# Patient Record
Sex: Male | Born: 1975 | Race: Black or African American | Hispanic: No | Marital: Single | Smoking: Current every day smoker
Health system: Southern US, Community
[De-identification: ages and names within clinical notes are randomized; demographics above are authoritative.]

## PROBLEM LIST (undated history)

## (undated) DIAGNOSIS — N184 Chronic kidney disease, stage 4 (severe): Secondary | ICD-10-CM

## (undated) DIAGNOSIS — J449 Chronic obstructive pulmonary disease, unspecified: Secondary | ICD-10-CM

## (undated) DIAGNOSIS — F32A Depression, unspecified: Secondary | ICD-10-CM

## (undated) DIAGNOSIS — Z87442 Personal history of urinary calculi: Secondary | ICD-10-CM

## (undated) DIAGNOSIS — K589 Irritable bowel syndrome without diarrhea: Secondary | ICD-10-CM

## (undated) DIAGNOSIS — M519 Unspecified thoracic, thoracolumbar and lumbosacral intervertebral disc disorder: Secondary | ICD-10-CM

## (undated) DIAGNOSIS — E291 Testicular hypofunction: Secondary | ICD-10-CM

## (undated) DIAGNOSIS — R519 Headache, unspecified: Secondary | ICD-10-CM

## (undated) DIAGNOSIS — G629 Polyneuropathy, unspecified: Secondary | ICD-10-CM

## (undated) DIAGNOSIS — E785 Hyperlipidemia, unspecified: Secondary | ICD-10-CM

## (undated) DIAGNOSIS — F419 Anxiety disorder, unspecified: Secondary | ICD-10-CM

## (undated) DIAGNOSIS — I10 Essential (primary) hypertension: Secondary | ICD-10-CM

## (undated) DIAGNOSIS — D649 Anemia, unspecified: Secondary | ICD-10-CM

## (undated) DIAGNOSIS — M1990 Unspecified osteoarthritis, unspecified site: Secondary | ICD-10-CM

## (undated) DIAGNOSIS — G43909 Migraine, unspecified, not intractable, without status migrainosus: Secondary | ICD-10-CM

## (undated) DIAGNOSIS — Z720 Tobacco use: Secondary | ICD-10-CM

## (undated) DIAGNOSIS — F319 Bipolar disorder, unspecified: Secondary | ICD-10-CM

## (undated) DIAGNOSIS — F329 Major depressive disorder, single episode, unspecified: Secondary | ICD-10-CM

## (undated) DIAGNOSIS — M545 Low back pain, unspecified: Secondary | ICD-10-CM

## (undated) DIAGNOSIS — G6181 Chronic inflammatory demyelinating polyneuritis: Secondary | ICD-10-CM

## (undated) DIAGNOSIS — R51 Headache: Secondary | ICD-10-CM

## (undated) DIAGNOSIS — L600 Ingrowing nail: Principal | ICD-10-CM

## (undated) DIAGNOSIS — R011 Cardiac murmur, unspecified: Secondary | ICD-10-CM

## (undated) DIAGNOSIS — J42 Unspecified chronic bronchitis: Secondary | ICD-10-CM

## (undated) DIAGNOSIS — E119 Type 2 diabetes mellitus without complications: Secondary | ICD-10-CM

## (undated) DIAGNOSIS — G4733 Obstructive sleep apnea (adult) (pediatric): Secondary | ICD-10-CM

## (undated) DIAGNOSIS — F1910 Other psychoactive substance abuse, uncomplicated: Secondary | ICD-10-CM

## (undated) DIAGNOSIS — E0590 Thyrotoxicosis, unspecified without thyrotoxic crisis or storm: Secondary | ICD-10-CM

## (undated) DIAGNOSIS — G8929 Other chronic pain: Secondary | ICD-10-CM

## (undated) DIAGNOSIS — K219 Gastro-esophageal reflux disease without esophagitis: Secondary | ICD-10-CM

## (undated) HISTORY — PX: CARPAL TUNNEL RELEASE: SHX101

## (undated) HISTORY — DX: Tobacco use: Z72.0

## (undated) HISTORY — DX: Depression, unspecified: F32.A

## (undated) HISTORY — DX: Obstructive sleep apnea (adult) (pediatric): G47.33

## (undated) HISTORY — DX: Gastro-esophageal reflux disease without esophagitis: K21.9

## (undated) HISTORY — DX: Essential (primary) hypertension: I10

## (undated) HISTORY — DX: Hyperlipidemia, unspecified: E78.5

## (undated) HISTORY — PX: LITHOTRIPSY: SUR834

## (undated) HISTORY — DX: Other psychoactive substance abuse, uncomplicated: F19.10

## (undated) HISTORY — DX: Polyneuropathy, unspecified: G62.9

## (undated) HISTORY — DX: Major depressive disorder, single episode, unspecified: F32.9

## (undated) HISTORY — DX: Ingrowing nail: L60.0

## (undated) HISTORY — DX: Unspecified osteoarthritis, unspecified site: M19.90

## (undated) HISTORY — DX: Unspecified thoracic, thoracolumbar and lumbosacral intervertebral disc disorder: M51.9

---
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
## 2007-02-14 ENCOUNTER — Ambulatory Visit: Payer: Self-pay | Admitting: Internal Medicine

## 2007-02-14 LAB — CONVERTED CEMR LAB
Albumin: 4.4 g/dL (ref 3.5–5.2)
Alkaline Phosphatase: 130 units/L — ABNORMAL HIGH (ref 39–117)
BUN: 12 mg/dL (ref 6–23)
Basophils Absolute: 0.1 10*3/uL (ref 0.0–0.1)
Bilirubin Urine: NEGATIVE
Cholesterol: 223 mg/dL (ref 0–200)
Creatinine,U: 129 mg/dL
Direct LDL: 153 mg/dL
Eosinophils Absolute: 0.1 10*3/uL (ref 0.0–0.6)
GFR calc Af Amer: 113 mL/min
GFR calc non Af Amer: 93 mL/min
HCT: 39.6 % (ref 39.0–52.0)
HDL: 25.9 mg/dL — ABNORMAL LOW (ref >39.0)
Hemoglobin, Urine: NEGATIVE
Hemoglobin: 14 g/dL (ref 13.0–17.0)
Leukocytes, UA: NEGATIVE
MCHC: 35.4 g/dL (ref 30.0–36.0)
MCV: 91.5 fL (ref 78.0–100.0)
Microalb Creat Ratio: 31.8 mg/g — ABNORMAL HIGH (ref 0.0–30.0)
Microalb, Ur: 4.1 mg/dL — ABNORMAL HIGH (ref 0.0–1.9)
Monocytes Absolute: 0.8 10*3/uL — ABNORMAL HIGH (ref 0.2–0.7)
Monocytes Relative: 9.8 % (ref 3.0–11.0)
Neutro Abs: 5.1 10*3/uL (ref 1.4–7.7)
Neutrophils Relative %: 59.7 % (ref 43.0–77.0)
Potassium: 3.9 meq/L (ref 3.5–5.1)
RDW: 13.4 % (ref 11.5–14.6)
Sodium: 132 meq/L — ABNORMAL LOW (ref 135–145)
TSH: 1 microintl units/mL (ref 0.35–5.50)
Total Bilirubin: 0.6 mg/dL (ref 0.3–1.2)
Total CHOL/HDL Ratio: 8.6
Urobilinogen, UA: 0.2 (ref 0.0–1.0)
VLDL: 43 mg/dL — ABNORMAL HIGH (ref 0–40)
pH: 6 (ref 5.0–8.0)

## 2007-03-20 ENCOUNTER — Encounter: Admission: RE | Admit: 2007-03-20 | Discharge: 2007-03-29 | Payer: Self-pay | Admitting: Internal Medicine

## 2007-04-10 ENCOUNTER — Ambulatory Visit: Payer: Self-pay | Admitting: Internal Medicine

## 2007-04-10 LAB — CONVERTED CEMR LAB
BUN: 11 mg/dL (ref 6–23)
Calcium: 9.4 mg/dL (ref 8.4–10.5)
Creatinine, Ser: 1 mg/dL (ref 0.4–1.5)
GFR calc Af Amer: 112 mL/min
Hgb A1c MFr Bld: 7.9 % — ABNORMAL HIGH (ref 4.6–6.0)
Potassium: 4.3 meq/L (ref 3.5–5.1)
Triglycerides: 78 mg/dL (ref 0–149)
VLDL: 16 mg/dL (ref 0–40)

## 2007-04-12 ENCOUNTER — Encounter: Payer: Self-pay | Admitting: Internal Medicine

## 2007-04-12 DIAGNOSIS — Z87442 Personal history of urinary calculi: Secondary | ICD-10-CM

## 2007-04-12 DIAGNOSIS — R51 Headache: Secondary | ICD-10-CM

## 2007-04-12 DIAGNOSIS — E1165 Type 2 diabetes mellitus with hyperglycemia: Secondary | ICD-10-CM | POA: Insufficient documentation

## 2007-04-12 DIAGNOSIS — R519 Headache, unspecified: Secondary | ICD-10-CM | POA: Insufficient documentation

## 2007-04-12 DIAGNOSIS — M1990 Unspecified osteoarthritis, unspecified site: Secondary | ICD-10-CM | POA: Insufficient documentation

## 2007-04-12 DIAGNOSIS — E1169 Type 2 diabetes mellitus with other specified complication: Secondary | ICD-10-CM | POA: Insufficient documentation

## 2007-04-12 DIAGNOSIS — E785 Hyperlipidemia, unspecified: Secondary | ICD-10-CM

## 2007-04-12 DIAGNOSIS — I10 Essential (primary) hypertension: Secondary | ICD-10-CM | POA: Insufficient documentation

## 2007-04-12 DIAGNOSIS — F339 Major depressive disorder, recurrent, unspecified: Secondary | ICD-10-CM | POA: Insufficient documentation

## 2007-04-19 ENCOUNTER — Emergency Department (HOSPITAL_COMMUNITY): Admission: EM | Admit: 2007-04-19 | Discharge: 2007-04-19 | Payer: Self-pay | Admitting: Emergency Medicine

## 2007-05-23 ENCOUNTER — Encounter: Payer: Self-pay | Admitting: Internal Medicine

## 2007-05-23 ENCOUNTER — Ambulatory Visit: Payer: Self-pay | Admitting: Internal Medicine

## 2007-05-23 LAB — CONVERTED CEMR LAB
CO2: 32 meq/L (ref 19–32)
Calcium: 9.8 mg/dL (ref 8.4–10.5)
Chloride: 108 meq/L (ref 96–112)
Cholesterol: 171 mg/dL (ref 0–200)
Glucose, Bld: 106 mg/dL — ABNORMAL HIGH (ref 70–99)
HDL: 37 mg/dL — ABNORMAL LOW (ref >39.0)
Sodium: 144 meq/L (ref 135–145)
Total CHOL/HDL Ratio: 4.6

## 2007-06-07 ENCOUNTER — Telehealth: Payer: Self-pay | Admitting: Internal Medicine

## 2007-06-09 ENCOUNTER — Ambulatory Visit: Payer: Self-pay | Admitting: Internal Medicine

## 2007-07-24 ENCOUNTER — Telehealth: Payer: Self-pay | Admitting: Internal Medicine

## 2007-08-09 ENCOUNTER — Telehealth (INDEPENDENT_AMBULATORY_CARE_PROVIDER_SITE_OTHER): Payer: Self-pay | Admitting: *Deleted

## 2007-09-14 ENCOUNTER — Ambulatory Visit: Payer: Self-pay | Admitting: Internal Medicine

## 2007-09-14 DIAGNOSIS — F528 Other sexual dysfunction not due to a substance or known physiological condition: Secondary | ICD-10-CM

## 2007-09-18 ENCOUNTER — Telehealth: Payer: Self-pay | Admitting: Internal Medicine

## 2007-09-18 LAB — CONVERTED CEMR LAB
BUN: 14 mg/dL (ref 6–23)
GFR calc Af Amer: 91 mL/min
GFR calc non Af Amer: 75 mL/min
Hgb A1c MFr Bld: 5.7 % (ref 4.6–6.0)
Potassium: 3.9 meq/L (ref 3.5–5.1)
Testosterone: 186.67 ng/dL — ABNORMAL LOW (ref 350.00–890)
Total CHOL/HDL Ratio: 5.4
Triglycerides: 78 mg/dL (ref 0–149)
VLDL: 16 mg/dL (ref 0–40)

## 2007-09-19 ENCOUNTER — Telehealth: Payer: Self-pay | Admitting: Internal Medicine

## 2007-09-20 ENCOUNTER — Encounter: Payer: Self-pay | Admitting: Internal Medicine

## 2007-10-02 ENCOUNTER — Encounter: Payer: Self-pay | Admitting: Internal Medicine

## 2007-10-02 ENCOUNTER — Telehealth (INDEPENDENT_AMBULATORY_CARE_PROVIDER_SITE_OTHER): Payer: Self-pay | Admitting: *Deleted

## 2007-10-09 ENCOUNTER — Ambulatory Visit: Payer: Self-pay | Admitting: Internal Medicine

## 2007-10-09 DIAGNOSIS — E291 Testicular hypofunction: Secondary | ICD-10-CM

## 2007-10-09 LAB — CONVERTED CEMR LAB
ALT: 77 units/L — ABNORMAL HIGH (ref 0–53)
Alkaline Phosphatase: 75 units/L (ref 39–117)
BUN: 14 mg/dL (ref 6–23)
CO2: 31 meq/L (ref 19–32)
Calcium: 9.8 mg/dL (ref 8.4–10.5)
Chloride: 109 meq/L (ref 96–112)
Creatinine, Ser: 1.3 mg/dL (ref 0.4–1.5)
GFR calc Af Amer: 83 mL/min
Total Protein: 7.9 g/dL (ref 6.0–8.3)

## 2007-10-10 ENCOUNTER — Telehealth: Payer: Self-pay | Admitting: Internal Medicine

## 2007-10-10 ENCOUNTER — Ambulatory Visit: Payer: Self-pay | Admitting: Internal Medicine

## 2007-10-10 LAB — CONVERTED CEMR LAB: Sex Hormone Binding: 12 nmol/L — ABNORMAL LOW (ref 13–71)

## 2007-10-11 ENCOUNTER — Telehealth: Payer: Self-pay | Admitting: Internal Medicine

## 2007-10-11 LAB — CONVERTED CEMR LAB
Hep B C IgM: NEGATIVE
Hepatitis B Surface Ag: NEGATIVE

## 2007-10-12 ENCOUNTER — Telehealth: Payer: Self-pay | Admitting: Internal Medicine

## 2007-12-01 ENCOUNTER — Ambulatory Visit: Payer: Self-pay | Admitting: Internal Medicine

## 2007-12-02 LAB — CONVERTED CEMR LAB
ALT: 36 units/L (ref 0–53)
BUN: 14 mg/dL (ref 6–23)
Basophils Relative: 0 % (ref 0.0–1.0)
CO2: 27 meq/L (ref 19–32)
Calcium: 9.3 mg/dL (ref 8.4–10.5)
Cholesterol: 178 mg/dL (ref 0–200)
Creatinine, Ser: 1.1 mg/dL (ref 0.4–1.5)
Creatinine,U: 115.6 mg/dL
Eosinophils Relative: 2.1 % (ref 0.0–5.0)
Glucose, Bld: 129 mg/dL — ABNORMAL HIGH (ref 70–99)
Hemoglobin: 13.5 g/dL (ref 13.0–17.0)
LDL Cholesterol: 136 mg/dL — ABNORMAL HIGH (ref 0–99)
Lymphocytes Relative: 33.7 % (ref 12.0–46.0)
MCHC: 33.9 g/dL (ref 30.0–36.0)
Microalb, Ur: 0.9 mg/dL (ref 0.0–1.9)
Monocytes Relative: 7.3 % (ref 3.0–12.0)
Neutro Abs: 3.6 10*3/uL (ref 1.4–7.7)
RBC: 4.23 M/uL (ref 4.22–5.81)
Total CHOL/HDL Ratio: 6.1
Total Protein: 7.3 g/dL (ref 6.0–8.3)

## 2007-12-04 ENCOUNTER — Ambulatory Visit: Payer: Self-pay | Admitting: Internal Medicine

## 2007-12-04 DIAGNOSIS — J984 Other disorders of lung: Secondary | ICD-10-CM

## 2007-12-04 DIAGNOSIS — M25539 Pain in unspecified wrist: Secondary | ICD-10-CM | POA: Insufficient documentation

## 2007-12-04 DIAGNOSIS — M25519 Pain in unspecified shoulder: Secondary | ICD-10-CM

## 2007-12-04 LAB — CONVERTED CEMR LAB
HCV Ab: NEGATIVE
Hep A IgM: NEGATIVE

## 2007-12-06 ENCOUNTER — Telehealth (INDEPENDENT_AMBULATORY_CARE_PROVIDER_SITE_OTHER): Payer: Self-pay | Admitting: *Deleted

## 2007-12-13 ENCOUNTER — Encounter: Payer: Self-pay | Admitting: Internal Medicine

## 2008-04-24 ENCOUNTER — Ambulatory Visit: Payer: Self-pay | Admitting: Internal Medicine

## 2008-06-04 ENCOUNTER — Ambulatory Visit: Payer: Self-pay | Admitting: Internal Medicine

## 2008-06-04 LAB — CONVERTED CEMR LAB
CO2: 27 meq/L (ref 19–32)
Calcium: 9.6 mg/dL (ref 8.4–10.5)
Chloride: 102 meq/L (ref 96–112)
Creatinine, Ser: 1 mg/dL (ref 0.4–1.5)
Glucose, Bld: 308 mg/dL — ABNORMAL HIGH (ref 70–99)
HDL: 28.9 mg/dL — ABNORMAL LOW (ref >39.0)
Sodium: 136 meq/L (ref 135–145)
Triglycerides: 158 mg/dL — ABNORMAL HIGH (ref 0–149)

## 2008-06-05 ENCOUNTER — Ambulatory Visit: Payer: Self-pay | Admitting: Internal Medicine

## 2008-06-05 DIAGNOSIS — R0989 Other specified symptoms and signs involving the circulatory and respiratory systems: Secondary | ICD-10-CM | POA: Insufficient documentation

## 2008-06-05 DIAGNOSIS — R0609 Other forms of dyspnea: Secondary | ICD-10-CM

## 2008-06-10 ENCOUNTER — Telehealth (INDEPENDENT_AMBULATORY_CARE_PROVIDER_SITE_OTHER): Payer: Self-pay | Admitting: *Deleted

## 2008-06-11 ENCOUNTER — Telehealth: Payer: Self-pay | Admitting: Internal Medicine

## 2008-10-08 ENCOUNTER — Encounter: Payer: Self-pay | Admitting: Internal Medicine

## 2008-12-11 ENCOUNTER — Telehealth: Payer: Self-pay | Admitting: Internal Medicine

## 2008-12-16 ENCOUNTER — Emergency Department (HOSPITAL_COMMUNITY): Admission: EM | Admit: 2008-12-16 | Discharge: 2008-12-16 | Payer: Self-pay | Admitting: Emergency Medicine

## 2008-12-16 ENCOUNTER — Inpatient Hospital Stay (HOSPITAL_COMMUNITY): Admission: RE | Admit: 2008-12-16 | Discharge: 2008-12-20 | Payer: Self-pay | Admitting: *Deleted

## 2008-12-16 ENCOUNTER — Ambulatory Visit: Payer: Self-pay | Admitting: *Deleted

## 2008-12-29 ENCOUNTER — Ambulatory Visit: Payer: Self-pay | Admitting: Diagnostic Radiology

## 2008-12-29 ENCOUNTER — Encounter: Payer: Self-pay | Admitting: Emergency Medicine

## 2008-12-29 ENCOUNTER — Ambulatory Visit: Payer: Self-pay | Admitting: *Deleted

## 2008-12-30 ENCOUNTER — Observation Stay (HOSPITAL_COMMUNITY): Admission: EM | Admit: 2008-12-30 | Discharge: 2008-12-30 | Payer: Self-pay | Admitting: Cardiology

## 2008-12-30 ENCOUNTER — Encounter (INDEPENDENT_AMBULATORY_CARE_PROVIDER_SITE_OTHER): Payer: Self-pay | Admitting: Hematology and Oncology

## 2009-01-10 ENCOUNTER — Ambulatory Visit: Payer: Self-pay | Admitting: Internal Medicine

## 2009-01-10 ENCOUNTER — Encounter: Payer: Self-pay | Admitting: Internal Medicine

## 2009-01-10 DIAGNOSIS — S62609A Fracture of unspecified phalanx of unspecified finger, initial encounter for closed fracture: Secondary | ICD-10-CM | POA: Insufficient documentation

## 2009-01-10 DIAGNOSIS — F1410 Cocaine abuse, uncomplicated: Secondary | ICD-10-CM

## 2009-01-10 DIAGNOSIS — R079 Chest pain, unspecified: Secondary | ICD-10-CM | POA: Insufficient documentation

## 2009-01-10 DIAGNOSIS — M79609 Pain in unspecified limb: Secondary | ICD-10-CM

## 2009-01-10 LAB — CONVERTED CEMR LAB
Albumin: 3.7 g/dL (ref 3.5–5.2)
Basophils Absolute: 0.1 10*3/uL (ref 0.0–0.1)
Basophils Relative: 2.9 % (ref 0.0–3.0)
Bilirubin Urine: NEGATIVE
CO2: 29 meq/L (ref 19–32)
Calcium: 9.2 mg/dL (ref 8.4–10.5)
Chloride: 107 meq/L (ref 96–112)
Cholesterol: 204 mg/dL — ABNORMAL HIGH (ref 0–200)
Creatinine,U: 106.5 mg/dL
Direct LDL: 150.5 mg/dL
Eosinophils Absolute: 0.1 10*3/uL (ref 0.0–0.7)
Glucose, Bld: 201 mg/dL — ABNORMAL HIGH (ref 70–99)
HCT: 38.3 % — ABNORMAL LOW (ref 39.0–52.0)
HDL: 39.7 mg/dL (ref >39.00)
Hemoglobin, Urine: NEGATIVE
Hemoglobin: 13.7 g/dL (ref 13.0–17.0)
Lymphocytes Relative: 27.3 % (ref 12.0–46.0)
Lymphs Abs: 1.3 10*3/uL (ref 0.7–4.0)
MCHC: 35.8 g/dL (ref 30.0–36.0)
MCV: 92.5 fL (ref 78.0–100.0)
Microalb Creat Ratio: 1.9 mg/g (ref 0.0–30.0)
Monocytes Absolute: 0.4 10*3/uL (ref 0.1–1.0)
Neutro Abs: 2.9 10*3/uL (ref 1.4–7.7)
Nitrite: NEGATIVE
RDW: 13 % (ref 11.5–14.6)
Sodium: 143 meq/L (ref 135–145)
Total Protein, Urine: NEGATIVE mg/dL
Total Protein: 6.8 g/dL (ref 6.0–8.3)
Triglycerides: 99 mg/dL (ref 0.0–149.0)
Urine Glucose: >=1000 mg/dL
VLDL: 19.8 mg/dL (ref 0.0–40.0)
pH: 5.5 (ref 5.0–8.0)

## 2009-01-17 ENCOUNTER — Encounter: Payer: Self-pay | Admitting: Internal Medicine

## 2009-01-23 ENCOUNTER — Encounter: Payer: Self-pay | Admitting: Internal Medicine

## 2009-01-24 ENCOUNTER — Ambulatory Visit: Payer: Self-pay | Admitting: Licensed Clinical Social Worker

## 2009-01-24 ENCOUNTER — Ambulatory Visit: Payer: Self-pay | Admitting: Cardiology

## 2009-01-24 ENCOUNTER — Telehealth (INDEPENDENT_AMBULATORY_CARE_PROVIDER_SITE_OTHER): Payer: Self-pay | Admitting: *Deleted

## 2009-02-03 ENCOUNTER — Ambulatory Visit: Payer: Self-pay | Admitting: Internal Medicine

## 2009-02-03 DIAGNOSIS — R300 Dysuria: Secondary | ICD-10-CM | POA: Insufficient documentation

## 2009-02-04 LAB — CONVERTED CEMR LAB
Chlamydia, Swab/Urine, PCR: NEGATIVE
GC Probe Amp, Urine: NEGATIVE
Ketones, ur: NEGATIVE mg/dL
Specific Gravity, Urine: >=1.03 (ref 1.000–1.030)
Urine Glucose: 250 mg/dL
Urobilinogen, UA: 0.2 (ref 0.0–1.0)

## 2009-02-05 ENCOUNTER — Telehealth: Payer: Self-pay | Admitting: Internal Medicine

## 2009-02-06 ENCOUNTER — Ambulatory Visit: Payer: Self-pay | Admitting: Licensed Clinical Social Worker

## 2009-02-07 ENCOUNTER — Encounter: Payer: Self-pay | Admitting: Internal Medicine

## 2009-02-11 ENCOUNTER — Encounter: Payer: Self-pay | Admitting: Internal Medicine

## 2009-02-11 ENCOUNTER — Encounter (INDEPENDENT_AMBULATORY_CARE_PROVIDER_SITE_OTHER): Payer: Self-pay | Admitting: *Deleted

## 2009-02-17 ENCOUNTER — Ambulatory Visit: Payer: Self-pay

## 2009-02-17 ENCOUNTER — Ambulatory Visit: Payer: Self-pay | Admitting: Cardiology

## 2009-02-21 ENCOUNTER — Encounter: Payer: Self-pay | Admitting: Internal Medicine

## 2009-03-18 ENCOUNTER — Ambulatory Visit: Payer: Self-pay | Admitting: Internal Medicine

## 2009-03-19 LAB — CONVERTED CEMR LAB
Calcium: 9.9 mg/dL (ref 8.4–10.5)
GFR calc non Af Amer: 110.63 mL/min (ref >60)
HDL: 39.4 mg/dL (ref >39.00)
Potassium: 4.4 meq/L (ref 3.5–5.1)
Sodium: 140 meq/L (ref 135–145)
Triglycerides: 66 mg/dL (ref 0.0–149.0)
VLDL: 13.2 mg/dL (ref 0.0–40.0)

## 2009-04-01 ENCOUNTER — Telehealth: Payer: Self-pay | Admitting: Internal Medicine

## 2009-04-02 ENCOUNTER — Telehealth: Payer: Self-pay | Admitting: Internal Medicine

## 2009-04-07 ENCOUNTER — Ambulatory Visit: Payer: Self-pay | Admitting: Internal Medicine

## 2009-04-07 DIAGNOSIS — J019 Acute sinusitis, unspecified: Secondary | ICD-10-CM

## 2009-04-08 ENCOUNTER — Telehealth: Payer: Self-pay | Admitting: Internal Medicine

## 2009-05-05 ENCOUNTER — Ambulatory Visit: Payer: Self-pay | Admitting: Cardiology

## 2009-05-19 ENCOUNTER — Ambulatory Visit: Payer: Self-pay | Admitting: Internal Medicine

## 2009-05-20 ENCOUNTER — Encounter: Payer: Self-pay | Admitting: Internal Medicine

## 2009-06-02 ENCOUNTER — Ambulatory Visit: Payer: Self-pay | Admitting: Internal Medicine

## 2009-06-02 DIAGNOSIS — M5430 Sciatica, unspecified side: Secondary | ICD-10-CM | POA: Insufficient documentation

## 2009-06-02 DIAGNOSIS — M21619 Bunion of unspecified foot: Secondary | ICD-10-CM

## 2009-06-02 LAB — CONVERTED CEMR LAB
BUN: 8 mg/dL (ref 6–23)
Bilirubin, Direct: 0.1 mg/dL (ref 0.0–0.3)
CO2: 28 meq/L (ref 19–32)
Calcium: 9.2 mg/dL (ref 8.4–10.5)
Chloride: 108 meq/L (ref 96–112)
Cholesterol: 125 mg/dL (ref 0–200)
Creatinine, Ser: 1.1 mg/dL (ref 0.4–1.5)
Glucose, Bld: 118 mg/dL — ABNORMAL HIGH (ref 70–99)
LDL Cholesterol: 84 mg/dL (ref 0–99)
Total Bilirubin: 0.6 mg/dL (ref 0.3–1.2)
Total CHOL/HDL Ratio: 4
Total Protein: 7.7 g/dL (ref 6.0–8.3)
VLDL: 9 mg/dL (ref 0.0–40.0)

## 2009-06-09 ENCOUNTER — Telehealth: Payer: Self-pay | Admitting: Internal Medicine

## 2009-06-09 DIAGNOSIS — K921 Melena: Secondary | ICD-10-CM | POA: Insufficient documentation

## 2009-06-10 ENCOUNTER — Ambulatory Visit: Payer: Self-pay | Admitting: Gastroenterology

## 2009-06-10 DIAGNOSIS — K625 Hemorrhage of anus and rectum: Secondary | ICD-10-CM | POA: Insufficient documentation

## 2009-06-10 DIAGNOSIS — R12 Heartburn: Secondary | ICD-10-CM | POA: Insufficient documentation

## 2009-06-10 DIAGNOSIS — K589 Irritable bowel syndrome without diarrhea: Secondary | ICD-10-CM | POA: Insufficient documentation

## 2009-06-24 ENCOUNTER — Telehealth: Payer: Self-pay | Admitting: Gastroenterology

## 2009-06-25 HISTORY — PX: BUNIONECTOMY WITH HAMMERTOE RECONSTRUCTION: SHX5600

## 2009-06-27 ENCOUNTER — Ambulatory Visit: Payer: Self-pay | Admitting: Internal Medicine

## 2009-06-27 DIAGNOSIS — G4710 Hypersomnia, unspecified: Secondary | ICD-10-CM | POA: Insufficient documentation

## 2009-06-27 DIAGNOSIS — M549 Dorsalgia, unspecified: Secondary | ICD-10-CM

## 2009-07-08 ENCOUNTER — Telehealth: Payer: Self-pay | Admitting: Gastroenterology

## 2009-07-09 ENCOUNTER — Ambulatory Visit: Payer: Self-pay | Admitting: Gastroenterology

## 2009-07-16 ENCOUNTER — Ambulatory Visit: Payer: Self-pay | Admitting: Pulmonary Disease

## 2009-08-05 ENCOUNTER — Encounter: Payer: Self-pay | Admitting: Internal Medicine

## 2009-08-07 ENCOUNTER — Ambulatory Visit: Payer: Self-pay | Admitting: Internal Medicine

## 2009-08-07 LAB — CONVERTED CEMR LAB
BUN: 11 mg/dL (ref 6–23)
CO2: 28 meq/L (ref 19–32)
Chloride: 100 meq/L (ref 96–112)
HDL: 30.6 mg/dL — ABNORMAL LOW (ref >39.00)
Hgb A1c MFr Bld: 6.8 % — ABNORMAL HIGH (ref 4.6–6.5)
LDL Cholesterol: 94 mg/dL (ref 0–99)
Potassium: 3.7 meq/L (ref 3.5–5.1)
VLDL: 35.4 mg/dL (ref 0.0–40.0)

## 2009-08-21 ENCOUNTER — Telehealth: Payer: Self-pay | Admitting: Internal Medicine

## 2009-08-22 ENCOUNTER — Ambulatory Visit: Payer: Self-pay | Admitting: Internal Medicine

## 2009-09-01 ENCOUNTER — Ambulatory Visit (HOSPITAL_BASED_OUTPATIENT_CLINIC_OR_DEPARTMENT_OTHER): Admission: RE | Admit: 2009-09-01 | Discharge: 2009-09-01 | Payer: Self-pay | Admitting: Pulmonary Disease

## 2009-09-01 ENCOUNTER — Encounter: Payer: Self-pay | Admitting: Pulmonary Disease

## 2009-09-14 ENCOUNTER — Ambulatory Visit: Payer: Self-pay | Admitting: Pulmonary Disease

## 2009-09-15 ENCOUNTER — Telehealth (INDEPENDENT_AMBULATORY_CARE_PROVIDER_SITE_OTHER): Payer: Self-pay | Admitting: *Deleted

## 2009-09-17 ENCOUNTER — Encounter: Payer: Self-pay | Admitting: Internal Medicine

## 2009-09-19 ENCOUNTER — Telehealth: Payer: Self-pay | Admitting: Internal Medicine

## 2009-09-22 ENCOUNTER — Ambulatory Visit: Payer: Self-pay | Admitting: Internal Medicine

## 2009-09-26 ENCOUNTER — Ambulatory Visit: Payer: Self-pay | Admitting: Pulmonary Disease

## 2009-09-26 DIAGNOSIS — G4733 Obstructive sleep apnea (adult) (pediatric): Secondary | ICD-10-CM

## 2009-10-02 ENCOUNTER — Telehealth (INDEPENDENT_AMBULATORY_CARE_PROVIDER_SITE_OTHER): Payer: Self-pay | Admitting: *Deleted

## 2009-10-03 ENCOUNTER — Telehealth: Payer: Self-pay | Admitting: Internal Medicine

## 2009-10-13 ENCOUNTER — Telehealth: Payer: Self-pay | Admitting: Internal Medicine

## 2009-10-27 ENCOUNTER — Encounter: Payer: Self-pay | Admitting: Pulmonary Disease

## 2009-11-03 ENCOUNTER — Ambulatory Visit: Payer: Self-pay | Admitting: Pulmonary Disease

## 2009-11-13 ENCOUNTER — Ambulatory Visit: Payer: Self-pay | Admitting: Internal Medicine

## 2009-11-13 DIAGNOSIS — R350 Frequency of micturition: Secondary | ICD-10-CM

## 2009-11-13 LAB — CONVERTED CEMR LAB
Bilirubin Urine: NEGATIVE
CO2: 26 meq/L (ref 19–32)
Calcium: 10 mg/dL (ref 8.4–10.5)
Creatinine, Ser: 1.3 mg/dL (ref 0.4–1.5)
Direct LDL: 93 mg/dL
Glucose, Bld: 371 mg/dL — ABNORMAL HIGH (ref 70–99)
Hemoglobin, Urine: NEGATIVE
Nitrite: NEGATIVE
Total CHOL/HDL Ratio: 5
Urine Glucose: >=1000 mg/dL
Urobilinogen, UA: 0.2 (ref 0.0–1.0)

## 2009-11-18 ENCOUNTER — Telehealth: Payer: Self-pay | Admitting: Internal Medicine

## 2009-12-08 ENCOUNTER — Ambulatory Visit: Payer: Self-pay | Admitting: Internal Medicine

## 2009-12-31 ENCOUNTER — Ambulatory Visit: Payer: Self-pay | Admitting: Internal Medicine

## 2010-02-11 ENCOUNTER — Ambulatory Visit: Payer: Self-pay | Admitting: Internal Medicine

## 2010-02-11 DIAGNOSIS — B370 Candidal stomatitis: Secondary | ICD-10-CM | POA: Insufficient documentation

## 2010-02-17 ENCOUNTER — Ambulatory Visit: Payer: Self-pay | Admitting: Internal Medicine

## 2010-02-17 ENCOUNTER — Encounter: Payer: Self-pay | Admitting: Internal Medicine

## 2010-02-17 ENCOUNTER — Telehealth (INDEPENDENT_AMBULATORY_CARE_PROVIDER_SITE_OTHER): Payer: Self-pay | Admitting: *Deleted

## 2010-03-02 ENCOUNTER — Ambulatory Visit: Payer: Self-pay | Admitting: Internal Medicine

## 2010-03-07 ENCOUNTER — Encounter: Payer: Self-pay | Admitting: Pulmonary Disease

## 2010-03-24 ENCOUNTER — Ambulatory Visit: Payer: Self-pay | Admitting: Internal Medicine

## 2010-03-24 DIAGNOSIS — J069 Acute upper respiratory infection, unspecified: Secondary | ICD-10-CM | POA: Insufficient documentation

## 2010-04-02 ENCOUNTER — Ambulatory Visit: Payer: Self-pay | Admitting: Internal Medicine

## 2010-04-06 ENCOUNTER — Encounter: Payer: Self-pay | Admitting: Internal Medicine

## 2010-05-06 ENCOUNTER — Telehealth: Payer: Self-pay | Admitting: Pulmonary Disease

## 2010-05-19 ENCOUNTER — Telehealth: Payer: Self-pay | Admitting: Internal Medicine

## 2010-05-22 ENCOUNTER — Telehealth: Payer: Self-pay | Admitting: Internal Medicine

## 2010-06-01 ENCOUNTER — Telehealth: Payer: Self-pay | Admitting: Pulmonary Disease

## 2010-06-01 ENCOUNTER — Telehealth: Payer: Self-pay | Admitting: Internal Medicine

## 2010-06-05 ENCOUNTER — Ambulatory Visit: Payer: Self-pay | Admitting: Internal Medicine

## 2010-06-05 ENCOUNTER — Ambulatory Visit: Payer: Self-pay | Admitting: Pulmonary Disease

## 2010-06-05 ENCOUNTER — Telehealth: Payer: Self-pay | Admitting: Internal Medicine

## 2010-06-05 LAB — CONVERTED CEMR LAB
Chloride: 100 meq/L (ref 96–112)
Cholesterol: 231 mg/dL — ABNORMAL HIGH (ref 0–200)
HDL: 42.8 mg/dL (ref >39.00)
Potassium: 4.5 meq/L (ref 3.5–5.1)
Total CHOL/HDL Ratio: 5
Triglycerides: 257 mg/dL — ABNORMAL HIGH (ref 0.0–149.0)
VLDL: 51.4 mg/dL — ABNORMAL HIGH (ref 0.0–40.0)

## 2010-06-06 ENCOUNTER — Emergency Department (HOSPITAL_BASED_OUTPATIENT_CLINIC_OR_DEPARTMENT_OTHER)
Admission: EM | Admit: 2010-06-06 | Discharge: 2010-06-06 | Payer: Self-pay | Source: Home / Self Care | Admitting: Emergency Medicine

## 2010-06-08 ENCOUNTER — Telehealth: Payer: Self-pay | Admitting: Pulmonary Disease

## 2010-06-09 ENCOUNTER — Ambulatory Visit: Payer: Self-pay | Admitting: Internal Medicine

## 2010-07-07 ENCOUNTER — Telehealth: Payer: Self-pay | Admitting: Internal Medicine

## 2010-07-09 ENCOUNTER — Ambulatory Visit: Payer: Self-pay | Admitting: Psychology

## 2010-07-10 ENCOUNTER — Ambulatory Visit: Payer: Self-pay | Admitting: Internal Medicine

## 2010-07-10 ENCOUNTER — Ambulatory Visit: Payer: Self-pay | Admitting: Licensed Clinical Social Worker

## 2010-07-10 DIAGNOSIS — K219 Gastro-esophageal reflux disease without esophagitis: Secondary | ICD-10-CM | POA: Insufficient documentation

## 2010-07-13 ENCOUNTER — Inpatient Hospital Stay (HOSPITAL_COMMUNITY)
Admission: AD | Admit: 2010-07-13 | Discharge: 2010-07-16 | Payer: Self-pay | Attending: Internal Medicine | Admitting: Internal Medicine

## 2010-07-13 ENCOUNTER — Emergency Department (HOSPITAL_BASED_OUTPATIENT_CLINIC_OR_DEPARTMENT_OTHER)
Admission: EM | Admit: 2010-07-13 | Discharge: 2010-07-13 | Payer: Self-pay | Source: Home / Self Care | Admitting: Emergency Medicine

## 2010-07-15 ENCOUNTER — Telehealth: Payer: Self-pay | Admitting: Internal Medicine

## 2010-07-16 ENCOUNTER — Encounter: Payer: Self-pay | Admitting: Internal Medicine

## 2010-07-22 ENCOUNTER — Ambulatory Visit
Admission: RE | Admit: 2010-07-22 | Discharge: 2010-07-22 | Payer: Self-pay | Source: Home / Self Care | Attending: Internal Medicine | Admitting: Internal Medicine

## 2010-08-05 ENCOUNTER — Telehealth: Payer: Self-pay | Admitting: Internal Medicine

## 2010-08-19 ENCOUNTER — Ambulatory Visit
Admission: RE | Admit: 2010-08-19 | Discharge: 2010-08-19 | Payer: Self-pay | Source: Home / Self Care | Attending: Internal Medicine | Admitting: Internal Medicine

## 2010-08-19 DIAGNOSIS — T1490XA Injury, unspecified, initial encounter: Secondary | ICD-10-CM | POA: Insufficient documentation

## 2010-08-27 NOTE — Progress Notes (Signed)
Summary: CALL  Phone Note Call from Patient Call back at Home Phone [phone]   Summary of Call: Patient is requesting a call back regarding a form & referral.  Initial call taken by: Lamar Sprinkles, CMA,  July 07, 2010 11:36 AM  Follow-up for Phone Call        Pt requests a new copy of Humalog pt asst form to be filled out and picked up. Per pt he mailed of original without signing. New form printed and filled out na in cabinet for pt pick up Follow-up by: Margaret Pyle, CMA,  July 07, 2010 4:08 PM

## 2010-08-27 NOTE — Assessment & Plan Note (Signed)
Summary: rov for osa   Visit Type:  Follow-up Copy to:  Oliver Barre Primary Provider/Referring Provider:  [name]  CC:  OSA...patient says he has been without cpap since March 2011 due to having NO insurance...he c/o waking up 4-5 times everynight and daytime fatigue.  History of Present Illness: the pt comes in today for f/u of his known moderate osa.  He had been started on cpap, and was slowly getting adapted to the device.  He felt it was helping him.  Unfortunately, he lost his insurance and could not afford the machine on his own.  He has been off cpap for some time, and is having significant sleep disruption at night and daytime hypersomnia.  Current Medications (verified): 1)  Onetouch Lancets   Misc (Lancets) .... Check Bld Sugar Qid 2)  Freestyle Lite Test  Strp (Glucose Blood) .... Use Asd 1 Once Daily 3)  Glimepiride 1 Mg Tabs (Glimepiride) .[name].. 1p O Two Times A Day As Needed Blood Sugar > 200 4)  Lantus Solostar 100 Unit/ml Soln (Insulin Glargine) .... Use Asd 40 Units Subcutaneously Once Daily 5)  Humalog Kwikpen 100 Unit/ml Soln (Insulin Lispro (Human)) .... Use Asd Ssi  Allergies (verified): No Known Drug Allergies  Review of Systems       The patient complains of shortness of breath with activity, productive cough, non-productive cough, nasal congestion/difficulty breathing through nose, and sneezing.  The patient denies shortness of breath at rest, coughing up blood, chest pain, irregular heartbeats, acid heartburn, indigestion, loss of appetite, weight change, abdominal pain, difficulty swallowing, sore throat, tooth/dental problems, headaches, itching, ear ache, anxiety, depression, hand/feet swelling, joint stiffness or pain, rash, change in color of mucus, and fever.    Vital Signs:  Patient profile:   35 year old male Height:      74 inches (187.96 cm) Weight:      200 pounds (90.91 kg) BMI:     25.77 O2 Sat:      99 % on Room air Temp:     98.2 degrees  F (36.78 degrees C) oral Pulse rate:   84 / minute BP sitting:   128 / 80  (left arm) Cuff size:   regular  Vitals Entered By: Michel Bickers CMA (June 05, 2010 1:53 PM)  O2 Sat at Rest %:  99 O2 Flow:  Room air CC: OSA...patient says he has been without cpap since March 2011 due to having NO insurance...he c/o waking up 4-5 times everynight and daytime fatigue Comments Medications reviewed with patient Michel Bickers CMA  June 05, 2010 1:54 PM   Physical Exam  General:  wd male in nad Nose:  no skin breakdown or pressure necrosis from cpap mask Extremities:  no edema or cyanosis Neurologic:  alert and oriented, moves all 4.    Impression & Recommendations:  Problem # 1:  OBSTRUCTIVE SLEEP APNEA (ICD-327.23) the pt has a h/o moderate osa, and was doing ok with cpap while he had the device. He has apparently lost his insurance, and hence is no longer using a cpap device.  He is having frequent awakenings at night, and daytime hypersomnia, both of which are probably due to his known osa.  It will be very difficult to get him a cpap device with no insurance, but will try.  Other Orders: Est. Patient Level III (16109) DME Referral (DME)  Patient Instructions: 1)  will see if the medical equipment companies have a machine they can loan to  you.  Will set the pressure on 10 as before since you seemed to do well on this. 2)  followup with me in 6mos.

## 2010-08-27 NOTE — Assessment & Plan Note (Signed)
Summary: rov for osa   Copy to:  Oliver Barre Primary Provider/Referring Provider:  [name]  CC:  Pt is here for a routine f/u appt.  Pt states he is wearing his cpap machine every night.  However pt states he doesn't wear machine all night (less than 4 hours per night ) long d/t problems with mask causing leaks.  Pt wonders if this is d/t his facial hair.  [name]  History of Present Illness: The pt comes in today for f/u of his osa.  He was started on cpap last visit, and has been wearing compliantly approx. 4 hrs a night.  He is having no issues with pressure, but is having issues with mask leaks.  He has seen a diffence in how he sleeps and feels the next day.  Current Medications (verified): 1)  Lisinopril 20 Mg Tabs (Lisinopril) .[name].. 1po Once Daily 2)  Fish Oil 1000 Mg  Caps (Omega-3 Fatty Acids) .[name].. 1 By Mouth Qd 3)  Metformin Hcl 500 Mg  Tb24 (Metformin Hcl) .... 2 By Mouth Qam and 2 in The Pm 4)  Daily Vitamins   Tabs (Multiple Vitamin) .[name].. 1 By Mouth Qd 5)  Onetouch Lancets   Misc (Lancets) .... Check Bld Sugar Qid 6)  Symbicort 160-4.5 Mcg/act Aero (Budesonide-Formoterol Fumarate) .... 2 Puffs Two Times A Day 7)  Freestyle Lite Test  Strp (Glucose Blood) .... Use Asd 1 Once Daily 8)  Celexa 10 Mg Tabs (Citalopram Hydrobromide) .[name].. 1 By Mouth Once Daily 9)  Aspirin 81 Mg  Tabs (Aspirin) .... Daily 10)  Ibuprofen 200 Mg Tabs (Ibuprofen) .... As Needed 11)  Simvastatin 80 Mg Tabs (Simvastatin) .[name].. 1po Once Daily 12)  Amlodipine Besylate 5 Mg Tabs (Amlodipine Besylate) .[name].. 1 By Mouth Once Daily 13)  Flexeril 5 Mg Tabs (Cyclobenzaprine Hcl) .[name].. 1 By Mouth Three Times A Day As Needed 14)  Nexium 40 Mg  Cpdr (Esomeprazole Magnesium) .[name].. 1 Capsule Each Day 30 Minutes Before Meal 15)  Glimepiride 1 Mg Tabs (Glimepiride) .[name].. 1p O Two Times A Day As Needed Blood Sugar > 200 16)  Lantus Solostar 100 Unit/ml Soln (Insulin Glargine) .... Use Asd 18 Units Subcutaneously Once Daily 17)   Lantus Solostar Needles .... Use Asd 1 Once Daily  Allergies (verified): No Known Drug Allergies  Review of Systems      See HPI  Vital Signs:  Patient profile:   35 year old male Height:      74 inches Weight:      236.25 pounds BMI:     30.44 O2 Sat:      99 % on Room air Temp:     98.5 degrees F oral Pulse rate:   82 / minute BP sitting:   126 / 92  (right arm) Cuff size:   large  Vitals Entered By: Arman Filter LPN (November 03, 2009 10:04 AM)  O2 Flow:  Room air CC: Pt is here for a routine f/u appt.  Pt states he is wearing his cpap machine every night.  However pt states he doesn't wear machine all night (less than 4 hours per night ) long d/t problems with mask causing leaks.  Pt wonders if this is d/t his facial hair.   Comments Medications reviewed with patient Arman Filter LPN  November 03, 2009 10:04 AM    Physical Exam  General:  wd male in nad Nose:  no skin breakdown or pressure necrosis from cpap mask Neurologic:  alert and oriented, not sleepy. moves all 4.   Impression & Recommendations:  Problem # 1:  OBSTRUCTIVE SLEEP APNEA (ICD-327.23) the pt is doing well with cpap, but is having issues with mask fit/leaks.  He would benefit from going over to the sleep center for a mask fitting, and we need to optimize his pressure for him on the auto setting.  Will give him 2 weeks on the auto setting, and call him with the pressure level once I receive his download.  I have also encouraged him to work on weight loss.  Other Orders: Est. Patient Level III (60454) DME Referral (DME) Sleep Disorder Referral (Sleep Disorder)  Patient Instructions: 1)  will optimize your pressure for you with the "auto" mode on your machine.  I will let you know your optimal pressure. 2)  will send to sleep center for mask fitting. 3)  followup with me in 6mos

## 2010-08-27 NOTE — Progress Notes (Signed)
Summary: cpap  Phone Note Call from Patient Call back at [phone]   Caller: Patient Call For: clance Reason for Call: Talk to Nurse Summary of Call: feel like level 10 on cpap is not strong enough.  What can be done? Initial call taken by: Eugene Gavia,  October 02, 2009 3:33 PM  Follow-up for Phone Call        Pt c/o feeling like he is suffocating while using the Cpap. he states whe he first puts it on a night he feels ok, feels like he can breathe fine, but then after a few hours he feels like he can't get any air and feels like he is suffocating and takes the mask off. He states about 3-4 hours a night before he can't take it anymore. Pt feels it is and air problem not a mask problem. Please advise. Carron Curie CMA  October 02, 2009 3:37 PM   Additional Follow-up for Phone Call Additional follow up Details #1::        It is unclear if this is a pressure tolerance issue, or whether he needs more pressure.  I don't mind trying 12cm of pressure, but it may be that he tolerates when first puts on because he uses ramp.  The other option is to get him to use ramp button if he awakens during the night and feels pressure issues.  i can try either one. Additional Follow-up by: Barbaraann Share MD,  October 03, 2009 1:40 PM    Additional Follow-up for Phone Call Additional follow up Details #2::    Spoke with pt and made aware of recs per [facility].  Pt verbalized understanding.  States that he has never tried using the ramp button, but will try this over the wkend.  He will let us know if this is not helping and then we can try increasing pressure to 12. Follow-up by: Vernie Murders,  October 03, 2009 1:44 PM

## 2010-08-27 NOTE — Progress Notes (Signed)
Summary: order for new ins  Phone Note Call from Patient Call back at [phone]   Caller: Patient Call For: clance Reason for Call: Talk to Nurse Summary of Call: insurance doesn't cover [facility], only covers  [facility].  Need new RX for cpap machine and supplies previously thru [facility] sent to [facility]. Initial call taken by: Eugene Gavia,  February 17, 2010 11:52 AM  Follow-up for Phone Call        Will forward message to [facility] to address if ok or not to send orders to [facility] now for cpap and supplies since pt's ins no longer covers [employer].   Pt was last seen 11-03-2009 and has pending appt with [facility] for 05-05-2010.  Aundra Millet Reynolds LPN  February 17, 2010 11:57 AM   Additional Follow-up for Phone Call Additional follow up Details #1::        will send to [facility] to address. Additional Follow-up by: Barbaraann Share MD,  February 17, 2010 12:56 PM    Additional Follow-up for Phone Call Additional follow up Details #2::    pt's insurance has changed and he has to be changed to [facility] for cpap and supplies pt is aware of this  Follow-up by: Oneita Jolly,  February 17, 2010 4:12 PM

## 2010-08-27 NOTE — Progress Notes (Signed)
Summary: nos appt  Phone Note Call from Patient   Caller: [email or web address]  Call For: clance Summary of Call: [facility]sc nos from 10/11 to 11/4. Initial call taken by: Darletta Moll,  May 06, 2010 9:59 AM

## 2010-08-27 NOTE — Assessment & Plan Note (Signed)
Summary: ELEV BLOOD SUGAR PER [facility]/WILL CALL BACK IF CAN COME THURS/NWS   Vital Signs:  Patient profile:   35 year old male Height:      73 inches Weight:      238.13 pounds BMI:     31.53 O2 Sat:      97 % on Room air Temp:     98.5 degrees F oral Pulse rate:   116 / minute BP sitting:   130 / 82  (left arm)  Vitals Entered By: Lucious Groves (August 07, 2009 2:40 PM)  O2 Flow:  Room air CC: Pt here for increased blood sugar levels-- Runs in the 300's before meals, pt also c/o skin peeling, and occ. dizziness./kb Is Patient Diabetic? Yes Did you bring your meter with you today? No Pain Assessment Patient in pain? no        Primary Care Provider:  [name]  CC:  Pt here for increased blood sugar levels-- Runs in the 300's before meals, pt also c/o skin peeling, and and occ. dizziness./kb.  History of Present Illness: here wtih recent elevated Blood sugars since the right foot surgury per podiatry but also wtih ? low sugars overnight but has not checked them, seems better to eat - happens about evrery other night;  walking with crutches and minimal wt bearing at this time, and pain clearly improved steadily;  denies fever, redness, sweling or worse pain to the foot post op and to f/u podiatry soon.  diet somewhat more liberal but is trying to follow DM diet;  Pt denies polydipsia, polyuria..  Overall good compliance with meds, trying to follow low chol, DM diet, wt stable, little excercise however .  Pt denies CP, sob, doe, wheezing, orthopnea, pnd, worsening LE edema, palps, dizziness or syncope  No GU symptoms such as pain or frequency.  No fever , St or cough, sob.  No further finger pain from the fx but is still concerned about it  Problems Prior to Update: 1)  Back Pain  (ICD-724.5) 2)  Finger Pain  (ICD-729.5) 3)  Hypersomnia  (ICD-780.54) 4)  Heartburn  (ICD-787.1) 5)  Hemorrhage of Rectum and Anus  (ICD-569.3) 6)  Ibs  (ICD-564.1) 7)  Hematochezia  (ICD-578.1) 8)   Hepatotoxicity, Drug-induced, Risk of  (ICD-V58.69) 9)  Bunions, Right Foot  (ICD-727.1) 10)  Sciatica, Right  (ICD-724.3) 11)  Sinusitis- Acute-nos  (ICD-461.9) 12)  Contact With or Exposure To Venereal Diseases  (ICD-V01.6) 13)  Dysuria  (ICD-788.1) 14)  Family History of Cad Male 1st Degree Relative <60  (ICD-V16.49) 15)  Hypertension  (ICD-401.9) 16)  Fracture, Index Finger, Right  (ICD-816.00) 17)  Wrist Pain, Right  (ICD-719.43) 18)  Chest Pain  (ICD-786.50) 19)  Hand Pain, Right  (ICD-729.5) 20)  Substance Abuse, Multiple  (ICD-305.90) 21)  Preventive Health Care  (ICD-V70.0) 22)  Dyspnea/shortness of Breath  (ICD-786.09) 23)  Lung Nodule  (ICD-518.89) 24)  Shoulder Pain, Right  (ICD-719.41) 25)  Wrist Pain, Left  (ICD-719.43) 26)  Preventive Health Care  (ICD-V70.0) 27)  Hypogonadism, 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 1 By Mouth Once Daily 2)  Actos 45 Mg  Tabs (Pioglitazone Hcl) .[name].. 1 By Mouth Once Daily 3)  Lisinopril 20 Mg Tabs (Lisinopril) .[name].. 1po Once Daily 4)  Fish Oil 1000 Mg  Caps (Omega-3 Fatty Acids) .[name].. 1 By Mouth Qd 5)  Metformin Hcl 500 Mg  Tb24 (Metformin Hcl) .... 2 By Mouth Qam and 2 in The Pm 6)  Daily Vitamins   Tabs (Multiple Vitamin) .[name].. 1 By Mouth Qd 7)  Onetouch Lancets   Misc (Lancets) .... Check Bld Sugar Qid 8)  Symbicort 160-4.5 Mcg/act Aero (Budesonide-Formoterol Fumarate) .... 2 Puffs Two Times A Day 9)  Freestyle Lite Test  Strp (Glucose Blood) .... Use Asd 1 Once Daily 10)  Celexa 10 Mg Tabs (Citalopram  Hydrobromide) .[name].. 1 By Mouth Once Daily 11)  Aspirin 81 Mg  Tabs (Aspirin) .... Daily 12)  Ibuprofen 200 Mg Tabs (Ibuprofen) .... As Needed 13)  Colchicine 0.6 Mg Tabs (Colchicine) .[name].. 1 By Mouth Once Daily 14)  Simvastatin 80 Mg Tabs (Simvastatin) .[name].. 1po Once Daily 15)  Amlodipine Besylate 5 Mg Tabs (Amlodipine Besylate) .[name].. 1 By Mouth Once Daily 16)  Hydrocodone-Homatropine 5-1.5 Mg/21ml Syrp (Hydrocodone-Homatropine) .[name].. 1 Tsp By Mouth Q 6 Hrs As Needed 17)  Flexeril 5 Mg Tabs (Cyclobenzaprine Hcl) .[name].. 1 By Mouth Three Times A Day As Needed 18)  Nexium 40 Mg  Cpdr (Esomeprazole Magnesium) .[name].. 1 Capsule Each Day 30 Minutes Before Meal  Current Medications (verified): 1)  Januvia 100 Mg  Tabs (Sitagliptin Phosphate) .[name].. 1 By Mouth Once Daily 2)  Actos 45 Mg  Tabs (Pioglitazone Hcl) .[name].. 1 By Mouth Once Daily 3)  Lisinopril 20 Mg Tabs (Lisinopril) .[name].. 1po Once Daily 4)  Fish Oil 1000 Mg  Caps (Omega-3 Fatty Acids) .[name].. 1 By Mouth Qd 5)  Metformin Hcl 500 Mg  Tb24 (Metformin Hcl) .... 2 By Mouth Qam and 2 in The Pm 6)  Daily Vitamins   Tabs (Multiple Vitamin) .[name].. 1 By Mouth Qd 7)  Onetouch Lancets   Misc (Lancets) .... Check Bld Sugar Qid 8)  Symbicort 160-4.5 Mcg/act Aero (Budesonide-Formoterol Fumarate) .... 2 Puffs Two Times A Day 9)  Freestyle Lite Test  Strp (Glucose Blood) .... Use Asd 1 Once Daily 10)  Celexa 10 Mg Tabs (Citalopram Hydrobromide) .[name].. 1 By Mouth Once Daily 11)  Aspirin 81 Mg  Tabs (Aspirin) .... Daily 12)  Ibuprofen 200 Mg Tabs (Ibuprofen) .... As Needed 13)  Colchicine 0.6 Mg Tabs (Colchicine) .[name].. 1 By Mouth Once Daily 14)  Simvastatin 80 Mg Tabs (Simvastatin) .[name].. 1po Once Daily 15)  Amlodipine Besylate 5 Mg Tabs (Amlodipine Besylate) .[name].. 1 By Mouth Once Daily 16)  Hydrocodone-Homatropine 5-1.5 Mg/14ml Syrp (Hydrocodone-Homatropine) .[name].. 1 Tsp By Mouth Q 6 Hrs As Needed 17)  Flexeril 5 Mg Tabs (Cyclobenzaprine Hcl) .[name].. 1 By Mouth Three Times A Day As  Needed 18)  Nexium 40 Mg  Cpdr (Esomeprazole Magnesium) .[name].. 1 Capsule Each Day 30 Minutes Before Meal 19)  Glimepiride 1 Mg Tabs (Glimepiride) .[name].. 1p O Two Times A Day As Needed Blood Sugar > 200  Allergies (verified): No Known Drug Allergies  Past History:  Past Medical History: Last updated: 06/10/2009 1. Depression 2. Diabetes mellitus, type II 3. Hyperlipidemia 4. Hypertension 5. Headaches 6. Osteoarthritis 7. Hx of Kidney Stones 8. polysubstance use - cocaine, marijuana (quit summer, 2010) 9. Tobacco abuse 10. Asthma 11. Echo (6/10): EF 55-60% with mild LV hypertrophy,  no regional wall motion abnormalities, normal diastolic function, normal RV, no pericardial effusion, normal PA systolic pressure.  12.  Chest pain: Possible episode of acute pericarditis in 6/10. ETT (7/10): 9', 10.1 METS, developed chest pain but no ST segment changes.  Negative study.  Lumbar Disc disease with sciatica right bunion   Social History: Last updated: 07/16/2009 Former smoker.  Started at age 49. 1 pack cigarettes/week.  Quit Dec 2009. 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   Head:  normocephalic and atraumatic.   Eyes:  vision grossly intact, pupils equal, and pupils round.   Ears:  R ear normal and L ear normal.   Nose:  no external deformity and no nasal discharge.   Mouth:  no gingival abnormalities and pharynx pink and moist.   Neck:  supple and no masses.   Lungs:  normal respiratory effort and normal breath sounds.   Heart:  normal  rate and regular rhythm.   Abdomen:  soft, non-tender, and normal bowel sounds.   Msk:  finger without swelling or redness or tender Extremities:  no edema, no erythema to LLE   Impression & Recommendations:  Problem # 1:  DIABETES MELLITUS, TYPE II (ICD-250.00)  His updated medication list for this problem includes:    Januvia 100 Mg Tabs (Sitagliptin phosphate) .[name]... 1 by mouth once daily    Actos 45 Mg Tabs (Pioglitazone hcl) .[name]... 1 by mouth once daily    Lisinopril 20 Mg Tabs (Lisinopril) .[name]... 1po once daily    Metformin Hcl 500 Mg Tb24 (Metformin hcl) .[name]... 2 by mouth qam and 2 in the pm    Aspirin 81 Mg Tabs (Aspirin) .[name]... Daily    Glimepiride 1 Mg Tabs (Glimepiride) .[name]... 1p o two times a day as needed blood sugar > 200 clearly has elevated sugars post right foot surgury/bunion recent during the day, but also ? low sugars at night as well;  ok for glimeparide 1 mg two times a day as needed CBG > 200; but also advised to check sugars nightly approx 1-2AM - if has low sugars at night can stop the metformin in the evening; call in 4 days with cbg's  Orders: TLB-BMP (Basic Metabolic Panel-BMET) (80048-METABOL) TLB-A1C / Hgb A1C (Glycohemoglobin) (83036-A1C) TLB-Lipid Panel (80061-LIPID)  Problem # 2:  HYPERTENSION (ICD-401.9)  His updated medication list for this problem includes:    Lisinopril 20 Mg Tabs (Lisinopril) .[name]... 1po once daily    Amlodipine Besylate 5 Mg Tabs (Amlodipine besylate) .[name]... 1 by mouth once daily  BP today: 130/82 Prior BP: 136/72 (07/16/2009)  Prior 10 Yr Risk Heart Disease: 6 % (02/17/2009)  Labs Reviewed: K+: 4.1 (06/02/2009) Creat: : 1.1 (06/02/2009)   Chol: 125 (06/02/2009)   HDL: 32.20 (06/02/2009)   LDL: 84 (06/02/2009)   TG: 45.0 (06/02/2009) stable overall by hx and exam, ok to continue meds/tx as is   Problem # 3:  FRACTURE, INDEX FINGER, RIGHT (ICD-816.00) resolved, ok to follow, reassured  Complete Medication List: 1)  Januvia 100  Mg Tabs (Sitagliptin phosphate) .Marland KitchenMarland KitchenMarland Kitchen  1 by mouth once daily 2)  Actos 45 Mg Tabs (Pioglitazone hcl) .[name].. 1 by mouth once daily 3)  Lisinopril 20 Mg Tabs (Lisinopril) .[name].. 1po once daily 4)  Fish Oil 1000 Mg Caps (Omega-3 fatty acids) .[name].. 1 by mouth qd 5)  Metformin Hcl 500 Mg Tb24 (Metformin hcl) .... 2 by mouth qam and 2 in the pm 6)  Daily Vitamins Tabs (Multiple vitamin) .[name].. 1 by mouth qd 7)  Onetouch Lancets Misc (Lancets) .... Check bld sugar qid 8)  Symbicort 160-4.5 Mcg/act Aero (Budesonide-formoterol fumarate) .... 2 puffs two times a day 9)  Freestyle Lite Test Strp (Glucose blood) .... Use asd 1 once daily 10)  Celexa 10 Mg Tabs (Citalopram hydrobromide) .[name].. 1 by mouth once daily 11)  Aspirin 81 Mg Tabs (Aspirin) .... Daily 12)  Ibuprofen 200 Mg Tabs (Ibuprofen) .... As needed 13)  Colchicine 0.6 Mg Tabs (Colchicine) .[name].. 1 by mouth once daily 14)  Simvastatin 80 Mg Tabs (Simvastatin) .[name].. 1po once daily 15)  Amlodipine Besylate 5 Mg Tabs (Amlodipine besylate) .[name].. 1 by mouth once daily 16)  Hydrocodone-homatropine 5-1.5 Mg/8ml Syrp (Hydrocodone-homatropine) .[name].. 1 tsp by mouth q 6 hrs as needed 17)  Flexeril 5 Mg Tabs (Cyclobenzaprine hcl) .[name].. 1 by mouth three times a day as needed 18)  Nexium 40 Mg Cpdr (Esomeprazole magnesium) .[name].. 1 capsule each day 30 minutes before meal 19)  Glimepiride 1 Mg Tabs (Glimepiride) .[name].. 1p o two times a day as needed blood sugar > 200  Patient Instructions: 1)  Please go to the Lab in the basement for your blood  tests today 2)  Please take all new medications as prescribed - the glimeparide ONLY for CBG > 200 when checking sugars two times a day  (before bfast and dinner) 3)  Continue all previous medications as before this visit , EXCEPT if you have low sugars at night (1-2 AM or so) you should NOT take the PM metformin 4)  call Mon Jan 17 with your blood sugar levels 5)  Please schedule a follow-up appointment as recommended at your last  office visit Prescriptions: GLIMEPIRIDE 1 MG TABS (GLIMEPIRIDE) 1p o two times a day as needed blood sugar > 200  #60 x 1   Entered and Authorized by:   [name]   Signed by:   [name] on 08/07/2009   Method used:   Print then Give to Patient   RxID:   [id number]

## 2010-08-27 NOTE — Assessment & Plan Note (Signed)
Summary: refill/cd   Vital Signs:  Patient profile:   35 year old male Height:      73 inches Weight:      207.38 pounds BMI:     27.46 O2 Sat:      98 % on Room air Temp:     98.5 degrees F oral Pulse rate:   71 / minute BP sitting:   110 / 80  (left arm) Cuff size:   large  Vitals Entered By: Zella Ball Ewing CMA Duncan Dull) (June 09, 2010 2:24 PM)  O2 Flow:  Room air CC: Refills, discuss medications/RE   Primary Care Provider:  [name]  CC:  Refills and discuss medications/RE.  History of Present Illness: here to f/u;  now has insurance for doctor visit and meds;  unfortunately lost his forms we filled out before for the pt assist program  and needs done again;  needs samples, tx recently for UTi with cephalexin  course;  overall better with less dysuria, freq, less urgent, and no back pain, fever or chills or n/v.  Still some mild dizziness to lie down.  Has some intermittent lbilat flank pain = needs urine checked again.  Pt denies CP, worsening sob, doe, wheezing, orthopnea, pnd, worsening LE edema, palps, dizziness or syncope  Pt denies new neuro symptoms such as headache, facial or extremity weakness  Pt denies polydipsia, polyuria, or low sugar symptoms such as shakiness improved with eating.  Overall fair compliance only with meds and  low chol, DM diet, wt stable, little excercise however .  Has worsening depressive symtpoms with fatigue, low mood more, sleeping more, without suicidal ideation or panic  Problems Prior to Update: 1)  Uri  (ICD-465.9) 2)  Toe Pain  (ICD-729.5) 3)  Thrush  (ICD-112.0) 4)  Frequency, Urinary  (ICD-788.41) 5)  Obstructive Sleep Apnea  (ICD-327.23) 6)  Back Pain  (ICD-724.5) 7)  Finger Pain  (ICD-729.5) 8)  Hypersomnia  (ICD-780.54) 9)  Heartburn  (ICD-787.1) 10)  Hemorrhage of Rectum and Anus  (ICD-569.3) 11)  Ibs  (ICD-564.1) 12)  Hematochezia  (ICD-578.1) 13)  Hepatotoxicity, Drug-induced, Risk of  (ICD-V58.69) 14)  Bunions, Right  Foot  (ICD-727.1) 15)  Sciatica, Right  (ICD-724.3) 16)  Sinusitis- Acute-nos  (ICD-461.9) 17)  Contact With or Exposure To Venereal Diseases  (ICD-V01.6) 18)  Dysuria  (ICD-788.1) 19)  Family History of Cad Male 1st Degree Relative <60  (ICD-V16.49) 20)  Hypertension  (ICD-401.9) 21)  Fracture, Index Finger, Right  (ICD-816.00) 22)  Wrist Pain, Right  (ICD-719.43) 23)  Chest Pain  (ICD-786.50) 24)  Hand Pain, Right  (ICD-729.5) 25)  Substance Abuse, Multiple  (ICD-305.90) 26)  Preventive Health Care  (ICD-V70.0) 27)  Dyspnea/shortness of Breath  (ICD-786.09) 28)  Lung Nodule  (ICD-518.89) 29)  Shoulder Pain, Right  (ICD-719.41) 30)  Wrist Pain, Left  (ICD-719.43) 31)  Preventive Health Care  (ICD-V70.0) 32)  Hypogonadism, 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 Check Bld Sugar Qid 2)  Freestyle Lite Test  Strp (Glucose Blood) .... Use Asd 1 Once Daily 3)  Glimepiride 1 Mg Tabs (Glimepiride) .[name].. 1p O Two Times A Day As Needed Blood Sugar > 200 4)  Lantus Solostar 100 Unit/ml Soln (Insulin Glargine) .... Use Asd 40 Units Subcutaneously Once Daily 5)  Humalog Kwikpen 100 Unit/ml Soln (Insulin Lispro (Human)) .... Use Asd Ssi  Current Medications (verified): 1)  Onetouch Lancets   Misc (Lancets) .... Check Bld Sugar Qid 2)  Freestyle Lite Test  Strp (Glucose Blood) .... Use Asd 1 Once Daily 3)  Glimepiride 1 Mg Tabs (Glimepiride) .[name].. 1po Two Times A Day As Needed Blood Sugar > 200 4)  Lantus Solostar 100 Unit/ml Soln (Insulin Glargine) .... Use Asd 40 Units  Subcutaneously Once Daily 5)  Humalog Kwikpen 100 Unit/ml Soln (Insulin Lispro (Human)) .... Use Asd Ssi 6)  Sertraline Hcl 100 Mg Tabs (Sertraline Hcl) .[name].. 1 By Mouth Once Daily 7)  Lipitor 10 Mg Tabs (Atorvastatin Calcium) .[name].. 1po Once Daily  Allergies (verified): No Known Drug Allergies  Past History:  Past Medical History: Last updated: 06/10/2009 1. Depression 2. Diabetes mellitus, type II 3. Hyperlipidemia 4. Hypertension 5. Headaches 6. Osteoarthritis 7. Hx of Kidney Stones 8. polysubstance use - cocaine, marijuana (quit summer, 2010) 9. Tobacco abuse 10. Asthma 11. Echo (6/10): EF 55-60% with mild LV hypertrophy, no regional wall motion abnormalities, normal diastolic function, normal RV, no pericardial effusion, normal PA systolic pressure.  12.  Chest pain: Possible episode of acute pericarditis in 6/10. ETT (7/10): 9', 10.1 METS, developed chest pain but no ST segment changes.  Negative study.  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  Started at age 47. 1 pack cigarettes/week.  Quit Dec 2009. 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   Head:  normocephalic and atraumatic.   Eyes:  vision grossly intact, pupils equal, and pupils round.   Ears:  R ear normal and L ear normal.   Nose:  no external deformity and no nasal discharge.   Mouth:  no gingival abnormalities and pharynx pink and moist.   Neck:  supple and no masses.     Lungs:  normal respiratory effort and normal breath sounds.   Heart:  normal rate and regular rhythm.   Msk:  no flank tender, no spine tender Extremities:  no edema, no erythema Neurologic:  strength normal in all extremities and sensation intact to light touch.   Psych:  depressed affect and moderately anxious.     Impression & Recommendations:  Problem # 1:  BACK PAIN (ICD-724.5) pt reqeusts  re-check urine studies, even though pain resolved  Orders: TLB-Udip w/ Micro (81001-URINE) T-Culture, Urine ([id number])  Problem # 2:  DIABETES MELLITUS, TYPE II (ICD-250.00)  His updated medication list for this problem includes:    Glimepiride 1 Mg Tabs (Glimepiride) .[name]... 1po two times a day as needed blood sugar > 200    Lantus Solostar 100 Unit/ml Soln (Insulin glargine) ..... Use asd 40 units subcutaneously once daily    Humalog Kwikpen 100 Unit/ml  Soln (Insulin lispro (human)) ..... Use asd ssi uncontrolled recently severe due to not being able to afford the med, now has charity care drug plan through [facility] and will start taing dialy  Labs Reviewed: Creat: 1.2 (06/05/2010)    Reviewed HgBA1c results: 14.3 (06/05/2010)  12.8 (11/13/2009)  Problem # 3:  HYPERTENSION (ICD-401.9)  BP today: 110/80 Prior BP: 128/80 (06/05/2010)  Prior 10 Yr Risk Heart Disease: 6 % (02/17/2009)  Labs Reviewed: K+: 4.5 (06/05/2010) Creat: : 1.2 (06/05/2010)   Chol: 231 (06/05/2010)   HDL: 42.80 (06/05/2010)   LDL: 94 (08/07/2009)   TG: 257.0 (06/05/2010) stable overall by hx and exam, ok to continue meds/tx as is   Problem # 4:  HYPERLIPIDEMIA (ICD-272.4)  Labs Reviewed: SGOT: 29 (06/02/2009)   SGPT: 30 (06/02/2009)  Prior 10 Yr Risk Heart Disease: 6 % (02/17/2009)   HDL:42.80 (06/05/2010), 40.50 (11/13/2009)  LDL:94 (08/07/2009), 84 (06/02/2009)  Chol:231 (06/05/2010), 194 (11/13/2009)  Trig:257.0 (06/05/2010), 325.0 (11/13/2009) to start lipitor 10 - treat as above, f/u any  worsening signs or symptoms , f/u labs next visit  His updated medication list for this problem includes:    Lipitor 10 Mg Tabs (Atorvastatin calcium) .[name]... 1po once daily  Problem # 5:  DEPRESSION (ICD-311)  moderate at least , with a few thoughts recently of suicide but no plan; mother and siters with health issues  has mult stressors - father with drug problem and wrecked the family Zenaida Niece;  treat as above, f/u any worsening signs or symptoms , hard to  afford counseling - ok for zoloft tx , refer counseling  Orders: Psychology Referral (Psychology)  His updated medication list for this problem includes:    Sertraline Hcl 100 Mg Tabs (Sertraline hcl) .[name]... 1 by mouth once daily  Complete Medication List: 1)  Onetouch Lancets Misc (Lancets) .... Check bld sugar qid 2)  Freestyle Lite Test Strp (Glucose blood) .... Use asd 1 once daily 3)  Glimepiride 1 Mg Tabs (Glimepiride) .[name].. 1po two times a day as needed blood sugar > 200 4)  Lantus Solostar 100 Unit/ml Soln (Insulin glargine) .... Use asd 40 units subcutaneously once daily 5)  Humalog Kwikpen 100 Unit/ml Soln (Insulin lispro (human)) .... Use asd ssi 6)  Sertraline Hcl 100 Mg Tabs (Sertraline hcl) .[name].. 1 by mouth once daily 7)  Lipitor 10 Mg Tabs (Atorvastatin calcium) .[name].. 1po once daily  Patient Instructions: 1)  Please go to the Lab in the basement for your urine tests today  2)  Please call the number on the [facility] for results of your testing  3)  Please take all new medications as prescribed   - the generic zoloft (sertraline) is started at HALF pill per day for 3 days, then whole pill after that, and the Lipitor when becomes generic near the beginning of december 4)  You will be contacted about the referral(s) to: counseling 5)  Please take all meds as prescribed - you are given all refills today 6)  Please schedule a follow-up appointment in 6 months: 7)  BMP prior to visit, ICD-9: 250.02 8)  Lipid Panel prior to visit,  ICD-9: 9)  HbgA1C prior to visit, ICD-9: Prescriptions: LIPITOR 10 MG TABS (ATORVASTATIN CALCIUM) 1po once daily  #90 x 3   Entered and Authorized by:   [name]   Signed by:   [name] on 06/09/2010   Method used:   Print then Give to Patient  RxID:   [id number] ONETOUCH LANCETS   MISC (LANCETS) check bld sugar qid  #100 x 3   Entered and Authorized by:   [name]   Signed by:   [name] on 06/09/2010   Method used:   Print then Give to Patient   RxID:   [id number] FREESTYLE LITE TEST  STRP (GLUCOSE BLOOD) use asd 1 once daily  #100 x 11   Entered and Authorized by:   [name]   Signed by:   [name] on 06/09/2010   Method used:   Print then Give to Patient   RxID:   [id number] GLIMEPIRIDE 1 MG TABS (GLIMEPIRIDE) 1po two times a day as needed blood sugar > 200  #60 x 11   Entered and Authorized by:   [name]   Signed by:   [name] on 06/09/2010   Method used:   Print then Give to Patient   RxID:   [id number] LANTUS SOLOSTAR 100 UNIT/ML SOLN (INSULIN GLARGINE) use asd 40 units subcutaneously once daily  #5pens x 11   Entered and Authorized by:   [name]   Signed by:   [name] on 06/09/2010   Method used:   Print then Give to Patient   RxID:   [id number] HUMALOG KWIKPEN 100 UNIT/ML SOLN (INSULIN LISPRO (HUMAN)) use asd SSI  #5pens x 11   Entered and Authorized by:   [name]   Signed by:   [name] on 06/09/2010   Method used:   Print then Give to Patient   RxID:   [id number] SERTRALINE HCL 100 MG TABS (SERTRALINE HCL) 1 by mouth once daily  #90 x 3   Entered and Authorized by:   [name]   Signed by:   [name] on 06/09/2010   Method used:   Print then Give to Patient   RxID:   [id number]    Orders Added: 1)  TLB-Udip w/ Micro [81001-URINE] 2)  T-Culture, Urine [[id number]] 3)  Psychology Referral [Psychology] 4)  Est. Patient  Level IV [10175]

## 2010-08-27 NOTE — Assessment & Plan Note (Signed)
Summary: blood sugar high/muscle spasms in legs-lb   Vital Signs:  Patient profile:   35 year old male Height:      74 inches Weight:      219.25 pounds BMI:     28.25 O2 Sat:      97 % on Room air Temp:     98.3 degrees F oral Pulse rate:   99 / minute BP sitting:   110 / 70  (left arm) Cuff size:   large  Vitals Entered By: Zella Ball Ewing CMA (AAMA) (March 02, 2010 1:25 PM)  O2 Flow:  Room air CC: Elevated BS, foot and leg muscle spasms/RE   Primary Care Provider:  [name]  CC:  Elevated BS and foot and leg muscle spasms/RE.  History of Present Illness: saw podiatry - doing well with the feet except for a tendon on the left distal foot that is inflamed; Pt denies CP, sob, doe, wheezing, orthopnea, pnd, worsening LE edema, palps, dizziness or syncope  Pt denies new neuro symptoms such as headache, facial or extremity weakness  Admits to some noncompliacne due to cost of the insulins.  Has missed or had mult low dosing and elev BS, then takes extra sometimes to cover, sometimes sister's insulin.  So CBG's have been quite variable, even over the past weekend as high as 579, and now 230.  Fiannces severe, unable to afford most meds, let alone the insulin.  Has declined referral to [facility].  no fever, GI orGU symptoms or other acute to account for sugar lability.  Problems Prior to Update: 1)  Toe Pain  (ICD-729.5) 2)  Thrush  (ICD-112.0) 3)  Frequency, Urinary  (ICD-788.41) 4)  Obstructive Sleep Apnea  (ICD-327.23) 5)  Back Pain  (ICD-724.5) 6)  Finger Pain  (ICD-729.5) 7)  Hypersomnia  (ICD-780.54) 8)  Heartburn  (ICD-787.1) 9)  Hemorrhage of Rectum and Anus  (ICD-569.3) 10)  Ibs  (ICD-564.1) 11)  Hematochezia  (ICD-578.1) 12)  Hepatotoxicity, Drug-induced, Risk of  (ICD-V58.69) 13)  Bunions, Right Foot  (ICD-727.1) 14)  Sciatica, Right  (ICD-724.3) 15)  Sinusitis- Acute-nos  (ICD-461.9) 16)  Contact With or Exposure To Venereal Diseases  (ICD-V01.6) 17)  Dysuria   (ICD-788.1) 18)  Family History of Cad Male 1st Degree Relative <60  (ICD-V16.49) 19)  Hypertension  (ICD-401.9) 20)  Fracture, Index Finger, Right  (ICD-816.00) 21)  Wrist Pain, Right  (ICD-719.43) 22)  Chest Pain  (ICD-786.50) 23)  Hand Pain, Right  (ICD-729.5) 24)  Substance Abuse, Multiple  (ICD-305.90) 25)  Preventive Health Care  (ICD-V70.0) 26)  Dyspnea/shortness of Breath  (ICD-786.09) 27)  Lung Nodule  (ICD-518.89) 28)  Shoulder Pain, Right  (ICD-719.41) 29)  Wrist Pain, Left  (ICD-719.43) 30)  Preventive Health Care  (ICD-V70.0) 31)  Hypogonadism, 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 1po Once Daily 2)  Fish Oil 1000 Mg  Caps (Omega-3 Fatty Acids) .[name].. 1 By Mouth Qd 3)  Daily Vitamins   Tabs (Multiple Vitamin) .[name].. 1 By Mouth Qd 4)  Onetouch Lancets   Misc (Lancets) .... Check Bld  Sugar Qid 5)  Symbicort 160-4.5 Mcg/act Aero (Budesonide-Formoterol Fumarate) .... 2 Puffs Two Times A Day 6)  Freestyle Lite Test  Strp (Glucose Blood) .... Use Asd 1 Once Daily 7)  Celexa 10 Mg Tabs (Citalopram Hydrobromide) .[name].. 1 By Mouth Once Daily 8)  Aspirin 81 Mg  Tabs (Aspirin) .... Daily 9)  Ibuprofen 200 Mg Tabs (Ibuprofen) .... As Needed 10)  Simvastatin 80 Mg Tabs (Simvastatin) .[name].. 1po Once Daily 11)  Amlodipine Besylate 5 Mg Tabs (Amlodipine Besylate) .[name].. 1 By Mouth Once Daily 12)  Flexeril 5 Mg Tabs (Cyclobenzaprine Hcl) .[name].. 1 By Mouth Three Times A Day As Needed 13)  Nexium 40 Mg  Cpdr (Esomeprazole Magnesium) .[name].. 1 Capsule Each Day 30 Minutes Before  Meal 14)  Glimepiride 1 Mg Tabs (Glimepiride) .[name].. 1p O Two Times A Day As Needed Blood Sugar > 200 15)  Lantus Solostar 100 Unit/ml Soln (Insulin Glargine) .... Use Asd 40 Units Subcutaneously Once Daily 16)  Lantus Solostar Needles .... Use Asd 1 Once Daily 17)  Humalog Kwikpen 100 Unit/ml Soln (Insulin Lispro (Human)) .... Use Asd Ssi 18)  Humalog Kwikpen Needle .... Use Asd Three Times A Day 19)  Nystatin 100000 Unit/ml Susp (Nystatin) .... 5 Cc By Mouth Four Times Per Day For 10 Days 20)  Tramadol Hcl 50 Mg Tabs (Tramadol Hcl) .[name].. 1 Po Q 6 Hrs As Needed  Current Medications (verified): 1)  Lisinopril 20 Mg Tabs (Lisinopril) .[name].. 1po Once Daily 2)  Fish Oil 1000 Mg  Caps (Omega-3 Fatty Acids) .[name].. 1 By Mouth Qd 3)  Daily Vitamins   Tabs (Multiple Vitamin) .[name].. 1 By Mouth Qd 4)  Onetouch Lancets   Misc (Lancets) .... Check Bld Sugar Qid 5)  Symbicort 160-4.5 Mcg/act Aero (Budesonide-Formoterol Fumarate) .... 2 Puffs Two Times A Day 6)  Freestyle Lite Test  Strp (Glucose Blood) .... Use Asd 1 Once Daily 7)  Celexa 10 Mg Tabs (Citalopram Hydrobromide) .[name].. 1 By Mouth Once Daily 8)  Aspirin 81 Mg  Tabs (Aspirin) .... Daily 9)  Ibuprofen 200 Mg Tabs (Ibuprofen) .... As Needed 10)  Simvastatin 80 Mg Tabs (Simvastatin) .[name].. 1po Once Daily 11)  Amlodipine Besylate 5 Mg Tabs (Amlodipine Besylate) .[name].. 1 By Mouth Once Daily 12)  Flexeril 5 Mg Tabs (Cyclobenzaprine Hcl) .[name].. 1 By Mouth Three Times A Day As Needed 13)  Nexium 40 Mg  Cpdr (Esomeprazole Magnesium) .[name].. 1 Capsule Each Day 30 Minutes Before Meal 14)  Glimepiride 1 Mg Tabs (Glimepiride) .[name].. 1p O Two Times A Day As Needed Blood Sugar > 200 15)  Lantus Solostar 100 Unit/ml Soln (Insulin Glargine) .... Use Asd 40 Units Subcutaneously Once Daily 16)  Lantus Solostar Needles .... Use Asd 1 Once Daily 17)  Humalog Kwikpen 100 Unit/ml Soln (Insulin Lispro (Human)) .... Use Asd Ssi 18)  Humalog Kwikpen Needle .... Use Asd Three Times A  Day 19)  Nystatin 100000 Unit/ml Susp (Nystatin) .... 5 Cc By Mouth Four Times Per Day For 10 Days 20)  Tramadol Hcl 50 Mg Tabs (Tramadol Hcl) .[name].. 1 Po Q 6 Hrs As Needed  Allergies (verified): No Known Drug Allergies  Past History:  Past Medical History: Last updated: 06/10/2009 1. Depression 2. Diabetes mellitus, type II 3. Hyperlipidemia 4. Hypertension 5. Headaches 6. Osteoarthritis 7. Hx of Kidney Stones 8. polysubstance use - cocaine, marijuana (quit summer, 2010) 9. Tobacco abuse 10. Asthma 11. Echo (6/10): EF 55-60% with mild LV hypertrophy, no regional wall motion abnormalities, normal  diastolic function, normal RV, no pericardial effusion, normal PA systolic pressure.  12.  Chest pain: Possible episode of acute pericarditis in 6/10. ETT (7/10): 9', 10.1 METS, developed chest pain but no ST segment changes.  Negative study.  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  Started at age 59. 1 pack cigarettes/week.  Quit Dec 2009. 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   Head:  normocephalic and atraumatic.   Eyes:  vision grossly intact, pupils equal, and pupils round.   Ears:  R ear normal and L ear normal.   Nose:  no external deformity and no nasal discharge.   Mouth:  no gingival abnormalities and pharynx pink and moist.   Neck:  supple and no masses.   Lungs:  normal respiratory effort and normal breath sounds.   Heart:  normal  rate and regular rhythm.   Abdomen:  soft, non-tender, and normal bowel sounds.   Extremities:  no edema, no erythema    Impression & Recommendations:  Problem # 1:  DIABETES MELLITUS, TYPE II (ICD-250.00)  His updated medication list for this problem includes:    Lisinopril 20 Mg Tabs (Lisinopril) .[name]... 1po once daily    Aspirin 81 Mg Tabs (Aspirin) .[name]... Daily    Glimepiride 1 Mg Tabs (Glimepiride) .[name]... 1p o two times a day as needed blood sugar > 200    Lantus Solostar 100 Unit/ml Soln (Insulin glargine) ..... Use asd 40 units subcutaneously once daily    Humalog Kwikpen 100 Unit/ml Soln (Insulin lispro (human)) ..... Use asd ssi  Labs Reviewed: Creat: 1.3 (11/13/2009)    Reviewed HgBA1c results: 12.8 (11/13/2009)  6.8 (08/07/2009) uncontrolled with severe financial issues, today I filled out with him applications for lantus and humalog from online applications from the drug companies, with pt to complete at home and send, also gave rx to go with it, and samples levemir and humalog as wel had  Complete Medication List: 1)  Lisinopril 20 Mg Tabs (Lisinopril) .[name].. 1po once daily 2)  Fish Oil 1000 Mg Caps (Omega-3 fatty acids) .[name].. 1 by mouth qd 3)  Daily Vitamins Tabs (Multiple vitamin) .[name].. 1 by mouth qd 4)  Onetouch Lancets Misc (Lancets) .... Check bld sugar qid 5)  Symbicort 160-4.5 Mcg/act Aero (Budesonide-formoterol fumarate) .... 2 puffs two times a day 6)  Freestyle Lite Test Strp (Glucose blood) .... Use asd 1 once daily 7)  Celexa 10 Mg Tabs (Citalopram hydrobromide) .[name].. 1 by mouth once daily 8)  Aspirin 81 Mg Tabs (Aspirin) .... Daily 9)  Ibuprofen 200 Mg Tabs (Ibuprofen) .... As needed 10)  Simvastatin 80 Mg Tabs (Simvastatin) .[name].. 1po once daily 11)  Amlodipine Besylate 5 Mg Tabs (Amlodipine besylate) .[name].. 1 by mouth once daily 12)  Flexeril 5 Mg Tabs (Cyclobenzaprine hcl) .[name].. 1 by mouth three times a day as needed 13)  Nexium 40 Mg Cpdr (Esomeprazole magnesium)  .[name].. 1 capsule each day 30 minutes before meal 14)  Glimepiride  1 Mg Tabs (Glimepiride) .[name].. 1p o two times a day as needed blood sugar > 200 15)  Lantus Solostar 100 Unit/ml Soln (Insulin glargine) .... Use asd 40 units subcutaneously once daily 16)  Lantus Solostar Needles  .... Use asd 1 once daily 17)  Humalog Kwikpen 100 Unit/ml Soln (Insulin lispro (human)) .... Use asd ssi 18)  Humalog Kwikpen Needle  .... Use asd three times a day 19)  Nystatin 100000 Unit/ml Susp (Nystatin) .... 5 cc by mouth four times per day for 10 days 20)  Tramadol Hcl 50 Mg Tabs (Tramadol hcl) .[name].. 1 po q 6 hrs as needed  Patient Instructions: 1)  you are given some levemir sample today which is dosed just like the lantus 2)  please fill out and send in the patient assistance form for the lantus 3)  Continue all previous medications as before this visit  4)  Please schedule a follow-up appointment as needed. Prescriptions: HUMALOG KWIKPEN NEEDLE use asd three times a day  #300 x 11   Entered and Authorized by:   [name]   Signed by:   [name] on 03/02/2010   Method used:   Print then Give to Patient   RxID:   [id number] HUMALOG KWIKPEN 100 UNIT/ML SOLN (INSULIN LISPRO (HUMAN)) use asd SSI  #5pens x 3   Entered and Authorized by:   [name]   Signed by:   [name] on 03/02/2010   Method used:   Print then Give to Patient   RxID:   [id number] LANTUS SOLOSTAR NEEDLES use asd 1 once daily  #100 x 3   Entered and Authorized by:   [name]   Signed by:   [name] on 03/02/2010   Method used:   Print then Give to Patient   RxID:   [id number] LANTUS SOLOSTAR 100 UNIT/ML SOLN (INSULIN GLARGINE) use asd 40 units subcutaneously once daily  #5pens x 3   Entered and Authorized by:   [name]   Signed by:   [name] on 03/02/2010   Method used:   Print then Give to Patient   RxID:   [id number]

## 2010-08-27 NOTE — Progress Notes (Signed)
Summary: Referral/Depression  Phone Note Call from Patient   Caller: Patient [phone] Summary of Call: Pt called stating that he is depressed, unmotivated and stressed out. Pt denies suicidal feeling and is not agitated but feels "very sad and overwhelmed".  Pt is requesting referral to specialist but declined to go to [facility] due to cost. Please advise. Initial call taken by: Margaret Pyle, CMA,  May 19, 2010 10:31 AM  Follow-up for Phone Call        ok for referral to psychiatry - I will do  Follow-up by: [name],  May 19, 2010 1:05 PM  Additional Follow-up for Phone Call Additional follow up Details #1::        Pt advised that he may need to be self-referred but he will be contacted with Dr names and phone numbers for appt Additional Follow-up by: Margaret Pyle, CMA,  May 19, 2010 1:11 PM

## 2010-08-27 NOTE — Progress Notes (Signed)
  Phone Note Refill Request Message from:  Patient on June 05, 2010 4:13 PM  Refills Requested: Medication #1:  ONETOUCH LANCETS   MISC check bld sugar qid  Medication #2:  FREESTYLE LITE TEST  STRP use asd 1 once daily  Medication #3:  GLIMEPIRIDE 1 MG TABS 1p o two times a day as needed blood sugar > 200  Medication #4:  LANTUS SOLOSTAR 100 UNIT/ML SOLN use asd 40 units subcutaneously once daily Humolog Kwikpen.Marland KitchenMarland KitchenMarland KitchenPt is requesting refills to new pharmacy [facility] in [facility].Jaydan Caryn Section did not specific pharmacy location. Called pt back to get pharmacy info and no answer, mailbox full.   Method Requested: Electronic Initial call taken by: Margaret Pyle, CMA,  June 05, 2010 4:14 PM  Follow-up for Phone Call        routines to robin Follow-up by: [name],  June 05, 2010 4:44 PM  Additional Follow-up for Phone Call Additional follow up Details #1::        called pt to question the pharmacy location. Patient requested an OV to discuss his meds. and at that time will get his refills Additional Follow-up by: Robin Ewing CMA Duncan Dull),  June 08, 2010 9:23 AM

## 2010-08-27 NOTE — Progress Notes (Signed)
Summary: canker lip  Phone Note Call from Patient Call back at Home Phone [phone]   Caller: Patient Summary of Call: pt called stating that he has a canker sore on his lip. pt is requesting RX to treat. Initial call taken by: Margaret Pyle, CMA,  October 03, 2009 4:09 PM  Follow-up for Phone Call        can try abreva OTC, also ok for valtrex generic as per emr Follow-up by: [name],  October 03, 2009 4:18 PM  Additional Follow-up for Phone Call Additional follow up Details #1::        pt informed via VM Additional Follow-up by: Margaret Pyle, CMA,  October 03, 2009 4:36 PM    New/Updated Medications: VALACYCLOVIR HCL 1 GM TABS (VALACYCLOVIR HCL) 1 gm by mouth two times a day for 5 days Prescriptions: VALACYCLOVIR HCL 1 GM TABS (VALACYCLOVIR HCL) 1 gm by mouth two times a day for 5 days  #10 x 2   Entered and Authorized by:   [name]   Signed by:   [name] on 10/03/2009   Method used:   [facility] to        [facility] [facility] [facility] [address] [phone]* (retail)       [address]       [place], [place]  [address]       Ph: [phone] or [phone]       Fax: [phone]   RxID:   [id number]

## 2010-08-27 NOTE — Progress Notes (Signed)
Summary: Rx alt  Phone Note Call from Patient Call back at Home Phone [phone]   Caller: Patient Summary of Call: pt called stating that Levaquin Rx was too expensive. pt is requesting cheaper alternative to [facility] in [place] Initial call taken by: Margaret Pyle, CMA,  November 18, 2009 11:00 AM  Follow-up for Phone Call        pt informed Follow-up by: Margaret Pyle, CMA,  November 18, 2009 11:18 AM    New/Updated Medications: AZITHROMYCIN 250 MG TABS (AZITHROMYCIN) 2po qd for 1 day, then 1po qd for 4days, then stop Prescriptions: AZITHROMYCIN 250 MG TABS (AZITHROMYCIN) 2po qd for 1 day, then 1po qd for 4days, then stop  #6 x 1   Entered by:   Margaret Pyle, CMA   Authorized by:   [name]   Signed by:   Margaret Pyle, CMA on 11/18/2009   Method used:   Electronically to        [facility] [address] (retail)       [address] [place], [place]  [address]       Ph: [phone] or [phone]       Fax: [phone]   RxID:   [id number] AZITHROMYCIN 250 MG TABS (AZITHROMYCIN) 2po qd for 1 day, then 1po qd for 4days, then stop  #6 x 1   Entered and Authorized by:   [name]   Signed by:   [name] on 11/18/2009   Method used:   [facility]        [facility] [address] [phone]* (retail)       [address]       [place], [place]  [address]       Ph: [phone] or [phone]       Fax: [phone]   RxID:   [id number]  done escript [name]  November 18, 2009 11:16 AM

## 2010-08-27 NOTE — Letter (Signed)
Summary: Meds & instructions/[facility]  Meds & instructions/[facility]   Imported By: Lester Brookings 07/23/2010 10:34:03  _____________________________________________________________________  External Attachment:    Type:   Image     Comment:   External Document

## 2010-08-27 NOTE — Progress Notes (Signed)
Summary: Allergy rx/otc  Phone Note Call from Patient Call back at Home Phone [phone]   Summary of Call: Patient left message on triage requesting prescription for allergies or what do you recommend otc. Please advise. Initial call taken by: Lucious Groves,  October 13, 2009 11:49 AM  Follow-up for Phone Call        I would try the allegra OTC,  if not working, he can call for rx for zyrtec or flonase  Follow-up by: [name],  October 13, 2009 1:15 PM  Additional Follow-up for Phone Call Additional follow up Details #1::        Patient notified. Additional Follow-up by: Lucious Groves,  October 13, 2009 3:15 PM

## 2010-08-27 NOTE — Progress Notes (Signed)
  Phone Note Call from Patient   Caller: Patient Summary of Call: Patient request a copy of his medication list. Sent to home address. Initial call taken by: Scharlene Gloss,  September 19, 2009 9:26 AM

## 2010-08-27 NOTE — Assessment & Plan Note (Signed)
Summary: right baby toe swollen/cd   Vital Signs:  Patient profile:   35 year old male Height:      74 inches Weight:      213.25 pounds BMI:     27.48 O2 Sat:      97 % on Room air Temp:     99.2 degrees F oral Pulse rate:   112 / minute BP sitting:   104 / 72  (left arm) Cuff size:   large  Vitals Entered By: Zella Ball Ewing CMA Duncan Dull) (March 24, 2010 3:37 PM)  O2 Flow:  Room air CC: Right foot small toe swollen/RE   Primary Care Rozalynn Buege:  [name]  CC:  Right foot small toe swollen/RE.  History of Present Illness: here to f/u;  unfort since july exam and toe film right 5th toe still mild tender and swollen, but not worsening overall in the last few wks;  july film neg for fx, has seen podiatry as well and has hx of foot surgruy at the 5th MTP area;  also with isolated mild left 3rd toe mild numbness at the tip of the toe with soft tissue swelling and slight callous/scaliness adn tender, but no ulcers, fever, other swelling or erythema. Pt denies CP, worsening sob, doe, wheezing, orthopnea, pnd, worsening LE edema, palps, dizziness or syncope  Pt denies new neuro symptoms such as headache, facial or extremity weakness  No fever, wt loss, night sweats, loss of appetite or other constitutional symptoms  CBG's have been approx 200 or better and doing better recently with better compliance with insulin;  Pt denies polydipsia, polyuria, or low sugar symptoms such as shakiness improved with eating.  Overall good compliance with meds, trying to follow low chol, DM diet, wt stable, little excercise however Still working on the applications for humalog and lantus - no decision on pt assist yet.  No fever, wt loss, night sweats, loss of appetite or other constitutional symptoms , but has had mild sinus congestoin for 3 days with clear drainage and mild ST now resolved  Problems Prior to Update: 1)  Uri  (ICD-465.9) 2)  Toe Pain  (ICD-729.5) 3)  Thrush  (ICD-112.0) 4)  Frequency, Urinary   (ICD-788.41) 5)  Obstructive Sleep Apnea  (ICD-327.23) 6)  Back Pain  (ICD-724.5) 7)  Finger Pain  (ICD-729.5) 8)  Hypersomnia  (ICD-780.54) 9)  Heartburn  (ICD-787.1) 10)  Hemorrhage of Rectum and Anus  (ICD-569.3) 11)  Ibs  (ICD-564.1) 12)  Hematochezia  (ICD-578.1) 13)  Hepatotoxicity, Drug-induced, Risk of  (ICD-V58.69) 14)  Bunions, Right Foot  (ICD-727.1) 15)  Sciatica, Right  (ICD-724.3) 16)  Sinusitis- Acute-nos  (ICD-461.9) 17)  Contact With or Exposure To Venereal Diseases  (ICD-V01.6) 18)  Dysuria  (ICD-788.1) 19)  Family History of Cad Male 1st Degree Relative <60  (ICD-V16.49) 20)  Hypertension  (ICD-401.9) 21)  Fracture, Index Finger, Right  (ICD-816.00) 22)  Wrist Pain, Right  (ICD-719.43) 23)  Chest Pain  (ICD-786.50) 24)  Hand Pain, Right  (ICD-729.5) 25)  Substance Abuse, Multiple  (ICD-305.90) 26)  Preventive Health Care  (ICD-V70.0) 27)  Dyspnea/shortness of Breath  (ICD-786.09) 28)  Lung Nodule  (ICD-518.89) 29)  Shoulder Pain, Right  (ICD-719.41) 30)  Wrist Pain, Left  (ICD-719.43) 31)  Preventive Health Care  (ICD-V70.0) 32)  Hypogonadism, 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 1po Once Daily 2)  Fish Oil 1000 Mg  Caps (Omega-3 Fatty Acids) .[name].. 1 By Mouth Qd 3)  Daily Vitamins   Tabs (Multiple Vitamin) .[name].. 1 By Mouth Qd 4)  Onetouch Lancets   Misc (Lancets) .... Check Bld Sugar Qid 5)  Symbicort 160-4.5 Mcg/act Aero (Budesonide-Formoterol Fumarate) .... 2 Puffs Two Times A Day 6)  Freestyle Lite Test  Strp (Glucose Blood) .... Use  Asd 1 Once Daily 7)  Celexa 10 Mg Tabs (Citalopram Hydrobromide) .[name].. 1 By Mouth Once Daily 8)  Aspirin 81 Mg  Tabs (Aspirin) .... Daily 9)  Ibuprofen 200 Mg Tabs (Ibuprofen) .... As Needed 10)  Simvastatin 80 Mg Tabs (Simvastatin) .[name].. 1po Once Daily 11)  Amlodipine Besylate 5 Mg Tabs (Amlodipine Besylate) .[name].. 1 By Mouth Once Daily 12)  Flexeril 5 Mg Tabs (Cyclobenzaprine Hcl) .[name].. 1 By Mouth Three Times A Day As Needed 13)  Nexium 40 Mg  Cpdr (Esomeprazole Magnesium) .[name].. 1 Capsule Each Day 30 Minutes Before Meal 14)  Glimepiride 1 Mg Tabs (Glimepiride) .[name].. 1p O Two Times A Day As Needed Blood Sugar > 200 15)  Lantus Solostar 100 Unit/ml Soln (Insulin Glargine) .... Use Asd 40 Units Subcutaneously Once Daily 16)  Lantus Solostar Needles .... Use Asd 1 Once Daily 17)  Humalog Kwikpen 100 Unit/ml Soln (Insulin Lispro (Human)) .... Use Asd Ssi 18)  Humalog Kwikpen Needle .... Use Asd Three Times A Day 19)  Nystatin 100000 Unit/ml Susp (Nystatin) .... 5 Cc By Mouth Four Times Per Day For 10 Days 20)  Tramadol Hcl 50 Mg Tabs (Tramadol Hcl) .[name].. 1 Po Q 6 Hrs As Needed  Current Medications (verified): 1)  Lisinopril 20 Mg Tabs (Lisinopril) .[name].. 1po Once Daily 2)  Fish Oil 1000 Mg  Caps (Omega-3 Fatty Acids) .[name].. 1 By Mouth Qd 3)  Daily Vitamins   Tabs (Multiple Vitamin) .[name].. 1 By Mouth Qd 4)  Onetouch Lancets   Misc (Lancets) .... Check Bld Sugar Qid 5)  Symbicort 160-4.5 Mcg/act Aero (Budesonide-Formoterol Fumarate) .... 2 Puffs Two Times A Day 6)  Freestyle Lite Test  Strp (Glucose Blood) .... Use Asd 1 Once Daily 7)  Celexa 10 Mg Tabs (Citalopram Hydrobromide) .[name].. 1 By Mouth Once Daily 8)  Aspirin 81 Mg  Tabs (Aspirin) .... Daily 9)  Ibuprofen 200 Mg Tabs (Ibuprofen) .... As Needed 10)  Simvastatin 80 Mg Tabs (Simvastatin) .[name].. 1po Once Daily 11)  Amlodipine Besylate 5 Mg Tabs (Amlodipine Besylate) .[name].. 1 By Mouth Once Daily 12)  Flexeril 5 Mg Tabs (Cyclobenzaprine Hcl) .[name].. 1 By Mouth  Three Times A Day As Needed 13)  Nexium 40 Mg  Cpdr (Esomeprazole Magnesium) .[name].. 1 Capsule Each Day 30 Minutes Before Meal 14)  Glimepiride 1 Mg Tabs (Glimepiride) .[name].. 1p O Two Times A Day As Needed Blood Sugar > 200 15)  Lantus Solostar 100 Unit/ml Soln (Insulin Glargine) .... Use Asd 40 Units Subcutaneously Once Daily 16)  Lantus Solostar Needles .... Use Asd 1 Once Daily 17)  Humalog Kwikpen 100 Unit/ml Soln (Insulin Lispro (Human)) .... Use Asd Ssi 18)  Humalog Kwikpen Needle .... Use Asd Three Times A Day 19)  Nystatin 100000 Unit/ml Susp (Nystatin) .... 5 Cc By  Mouth Four Times Per Day For 10 Days 20)  Tramadol Hcl 50 Mg Tabs (Tramadol Hcl) .[name].. 1 Po Q 6 Hrs As Needed  Allergies (verified): No Known Drug Allergies  Past History:  Past Medical History: Last updated: 06/10/2009 1. Depression 2. Diabetes mellitus, type II 3. Hyperlipidemia 4. Hypertension 5. Headaches 6. Osteoarthritis 7. Hx of Kidney Stones 8. polysubstance use - cocaine, marijuana (quit summer, 2010) 9. Tobacco abuse 10. Asthma 11. Echo (6/10): EF 55-60% with mild LV hypertrophy, no regional wall motion abnormalities, normal diastolic function, normal RV, no pericardial effusion, normal PA systolic pressure.  12.  Chest pain: Possible episode of acute pericarditis in 6/10. ETT (7/10): 9', 10.1 METS, developed chest pain but no ST segment changes.  Negative study.  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  Started at age 8. 1 pack cigarettes/week.  Quit Dec 2009. 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   Head:  normocephalic and atraumatic.   Eyes:  vision grossly intact, pupils equal, and pupils round.   Ears:  bilat tm's mild erythema, non bulging Nose:  nasal dischargemucosal pallor and mucosal edema.   Mouth:  pharyngeal erythema and fair dentition.   Neck:  supple and no masses.   Lungs:  normal respiratory effort and normal breath sounds.   Heart:  normal rate and regular rhythm.   Msk:  no joint tenderness and no joint swelling, but right 5th toe with mild soft tissue swelling and tenderness mid-toe;  also tender and soft tissue swelling to tip of 3rd toe left foot;  no ulceration, signficatn erythema, or red streaks  Extremities:  no edema, no erythema except for the above   Impression & Recommendations:  Problem # 1:  URI (ICD-465.9)  His updated medication list for this problem includes:    Aspirin 81 Mg Tabs (Aspirin) .[name]... Daily    Ibuprofen 200 Mg Tabs (Ibuprofen) .[name]... As needed c/w viral illness most likely,  for tyelnol prn  Problem # 2:  TOE PAIN (ICD-729.5) right fifth toe - still some swelling but doubt infection or gout - ok to follow for now - tylenol as needed, also 3rd toe distal irriation I suspect related to irriation with walking in the shoes he has had for 4 yrs;  urged to consider new shoes for better fit, consider f/u with podiatry  Problem # 3:  HYPERTENSION (ICD-401.9)  His updated medication list for this problem includes:    Lisinopril 20 Mg Tabs (Lisinopril) .[name]... 1po once daily    Amlodipine Besylate 5 Mg Tabs (Amlodipine besylate) .[name]... 1 by mouth once daily  BP today: 104/72 Prior BP: 110/70 (03/02/2010)  Prior 10 Yr Risk Heart Disease: 6 % (02/17/2009)  Labs Reviewed: K+: 4.7 (11/13/2009) Creat: : 1.3 (11/13/2009)   Chol: 194 (11/13/2009)   HDL: 40.50 (11/13/2009)   LDL: 94 (08/07/2009)   TG: 325.0  (11/13/2009) stable overall by hx and exam, ok to continue meds/tx as is  Problem # 4:  DIABETES MELLITUS, TYPE II (ICD-250.00)  His updated medication list for this problem includes:    Lisinopril 20 Mg Tabs (Lisinopril) .[name]... 1po once daily    Aspirin 81 Mg Tabs (Aspirin) .[name]... Daily    Glimepiride 1 Mg Tabs (Glimepiride) .[name]... 1p o two times a day as needed blood sugar > 200    Lantus Solostar 100 Unit/ml Soln (Insulin glargine) ..... Use asd 40 units subcutaneously once daily    Humalog Kwikpen 100 Unit/ml Soln (Insulin lispro (human)) ..... Use asd ssi improved, Continue all previous medications as before this visit , better compliance has improved his control;  to cont to pursue the pt assist programs  Labs Reviewed: Creat: 1.3 (11/13/2009)    Reviewed HgBA1c results: 12.8 (11/13/2009)  6.8 (08/07/2009)  Complete Medication List: 1)  Lisinopril 20 Mg Tabs (Lisinopril) .[name].. 1po once daily 2)  Fish Oil 1000 Mg Caps (Omega-3 fatty acids) .[name].. 1 by mouth qd 3)  Daily Vitamins Tabs (Multiple vitamin) .[name].. 1 by mouth qd 4)  Onetouch Lancets Misc (Lancets) .... Check bld sugar qid 5)  Symbicort 160-4.5 Mcg/act Aero (Budesonide-formoterol fumarate) .... 2 puffs two times a day 6)  Freestyle Lite Test Strp (Glucose blood) .... Use asd 1 once daily 7)  Celexa 10 Mg Tabs (Citalopram hydrobromide) .[name].. 1 by mouth once daily 8)  Aspirin 81 Mg Tabs (Aspirin) .... Daily 9)  Ibuprofen 200 Mg Tabs (Ibuprofen) .... As needed 10)  Simvastatin 80 Mg Tabs (Simvastatin) .[name].. 1po once daily 11)  Amlodipine Besylate 5 Mg Tabs (Amlodipine besylate) .[name].. 1 by mouth once daily 12)  Flexeril 5 Mg Tabs (Cyclobenzaprine hcl) .[name].. 1 by mouth three times a day as needed 13)  Nexium 40 Mg Cpdr (Esomeprazole magnesium) .[name].. 1 capsule each day 30 minutes before meal 14)  Glimepiride 1 Mg Tabs (Glimepiride) .[name].. 1p o two times a day as needed blood sugar > 200 15)  Lantus Solostar 100 Unit/ml Soln (Insulin  glargine) .... Use asd 40 units subcutaneously once daily 16)  Lantus Solostar Needles  .... Use asd 1 once daily 17)  Humalog Kwikpen 100 Unit/ml Soln (Insulin lispro (human)) .... Use asd ssi 18)  Humalog Kwikpen Needle  .... Use asd three times a day 19)  Nystatin 100000 Unit/ml Susp (Nystatin) .... 5 cc by mouth four times per day for 10 days 20)  Tramadol Hcl 50 Mg Tabs (Tramadol hcl) .[name].. 1 po q 6 hrs as needed  Patient Instructions: 1)  Continue all previous medications as before this visit  2)  You are given some samples today insulin 3)  Please continue to monitor your sugars as you do 4)  Please schedule a follow-up appointment in 2 months with: 5)  BMP prior to visit, ICD-9: 250.02 6)  Lipid Panel prior to visit, ICD-9: 7)  HbgA1C prior to visit, ICD-9:

## 2010-08-27 NOTE — Progress Notes (Signed)
  Phone Note From Pharmacy   Caller: [facility] Summary of Call: Refill request of pts medication from [facility] mail order. I called the patient and he informed does not want to use that mail order at this time. Informed the pt he has refills on his medications at this time and should be fine. Initial call taken by: Scharlene Gloss,  September 19, 2009 9:40 AM

## 2010-08-27 NOTE — Progress Notes (Signed)
Summary: need to sched ov with kc   [id number]  ---- Converted from flag ---- ---- 09/15/2009 10:45 AM, Arman Filter LPN wrote: pt needs ov with kc to discuss sleep study results. ------------------------------  ATC pt at home #.  Was informed pt had left the house for the day and i was instructed to call back.Arman Filter LPN  September 15, 2009 10:47 AM  [id number]. Arman Filter LPN  September 16, 2009 3:04 PM   called and spoke with pt.  pt scheduled to see [facility] 09-26-2009 at 9:15am ..Marland KitchenAundra Millet Danita Proud LPN  September 19, 2009 3:40 PM

## 2010-08-27 NOTE — Progress Notes (Signed)
Summary: Call eport  Phone Note Other Incoming   Caller: [facility] Summary of Call: [facility] Triage Call Report Triage Record Num: [id number] Operator: Ether Griffins Patient Name: Wayne [name] Call Date & Time: 05/30/2010 1:34:52PM Patient Phone: [phone] PCP: Oliver Barre Patient Gender: Male PCP Fax : [phone] Patient DOB: August 25, 1975 Practice Name: [facility] Reason for Call: Calling about congestion,stuffy nose,cough.Low back pain,urinary frequency and burning and urine has foul odor. BS 500 this am and has not checked it since.All emergent sxs of Urinary sxs-male r/o.Advised to go to UC. Protocol(s) Used: Urinary Symptoms - Male Recommended Outcome per Protocol: See Provider within 24 hours Reason for Outcome: Urinary tract symptoms AND any flank or low back, penis or scrotal pain Care Advice:  ~ Another adult should drive.  ~ Call provider if symptoms worsen, such as increasing pain in low back, pelvis, or side(s); blood in urine; or fever. Increase intake of fluids. Try to drink 8 oz. (.2 liter) every hour when awake, including unsweetened cranberry juice, unless on restricted fluids for other medical reasons. Take sips of fluid or eat ice chips if nauseated or vomiting.  ~  ~ SYMPTOM / CONDITION MANAGEMENT  ~ IMMEDIATE ACTION  ~ CAUTIONS  ~ List, or take, all current prescription(s), nonprescription or alternative medication(s) to provider for evaluation. Systemic Inflammatory Response Syndrome (SIRS): Watch for signs of a generalized, whole body infection. Occurs within days of a localized infection, especially of the urinary, GI, respiratory or nervous systems; or after a traumatic injury or invasive procedure. - Call EMS 911 if symptoms have worsened, such as increasing confusion or unusual drowsiness; cold and clammy skin; no urine output; rapid respiration (>30/min.) or slow respiration (<10/min.); struggling to breathe. - Go to the ED immediately for  early symptoms of rapid pulse >90/min. or rapid breathing >20/min. at rest; chills; oral temperature >100.4 F (38 C) or <96.8 F (36 C) when associated with Initial call taken by: Margaret Pyle, CMA,  June 01, 2010 8:15 AM

## 2010-08-27 NOTE — Medication Information (Signed)
Summary: [facility]   Imported By: Lester [place] 04/09/2010 11:39:42  _____________________________________________________________________  External Attachment:    Type:   Image     Comment:   External Document

## 2010-08-27 NOTE — Assessment & Plan Note (Signed)
Summary: ov to discuss sleep study results/mg   Copy to:  Wayne [name] Primary Provider/Referring Provider:  [name]  CC:  discuss sleep study results.  History of Present Illness: The pt comes in today for review of his recent sleep study.  He was found to have AHI of 24/hr with desat to 82%.  I have gone over the study with him in detail, and answered all of his questions.  Current Medications (verified): 1)  Lisinopril 20 Mg Tabs (Lisinopril) .[name].. 1po Once Daily 2)  Fish Oil 1000 Mg  Caps (Omega-3 Fatty Acids) .[name].. 1 By Mouth Qd 3)  Metformin Hcl 500 Mg  Tb24 (Metformin Hcl) .... 2 By Mouth Qam and 2 in The Pm 4)  Daily Vitamins   Tabs (Multiple Vitamin) .[name].. 1 By Mouth Qd 5)  Onetouch Lancets   Misc (Lancets) .... Check Bld Sugar Qid 6)  Symbicort 160-4.5 Mcg/act Aero (Budesonide-Formoterol Fumarate) .... 2 Puffs Two Times A Day 7)  Freestyle Lite Test  Strp (Glucose Blood) .... Use Asd 1 Once Daily 8)  Celexa 10 Mg Tabs (Citalopram Hydrobromide) .[name].. 1 By Mouth Once Daily 9)  Aspirin 81 Mg  Tabs (Aspirin) .... Daily 10)  Ibuprofen 200 Mg Tabs (Ibuprofen) .... As Needed 11)  Simvastatin 80 Mg Tabs (Simvastatin) .[name].. 1po Once Daily 12)  Amlodipine Besylate 5 Mg Tabs (Amlodipine Besylate) .[name].. 1 By Mouth Once Daily 13)  Flexeril 5 Mg Tabs (Cyclobenzaprine Hcl) .[name].. 1 By Mouth Three Times A Day As Needed 14)  Nexium 40 Mg  Cpdr (Esomeprazole Magnesium) .[name].. 1 Capsule Each Day 30 Minutes Before Meal 15)  Glimepiride 1 Mg Tabs (Glimepiride) .[name].. 1p O Two Times A Day As Needed Blood Sugar > 200 16)  Lantus Solostar 100 Unit/ml Soln (Insulin Glargine) .... Use Asd 18 Units Subcutaneously Once Daily 17)  Lantus Solostar Needles .... Use Asd 1 Once Daily  Allergies (verified): No Known Drug Allergies  Vital Signs:  Patient profile:   35 year old male Weight:      240.50 pounds O2 Sat:      97 % on Room air Temp:     98 degrees F oral Pulse rate:   91 / minute BP sitting:    118 / 70  (right arm) Cuff size:   large  Vitals Entered By: [name] RN (September 26, 2009 9:05 AM)  O2 Flow:  Room air  Physical Exam  General:  ow male in nad   Impression & Recommendations:  Problem # 1:  OBSTRUCTIVE SLEEP APNEA (ICD-327.23) The pt has moderate osa by his recent sleep study, and continues to be symptomatic.  I have outlined various treatment options, including a trial of weight loss alone, dental appliance, surgery, and cpap.  His ua anatomy is really not amenable to surgery.  I have recommended cpap while he is working on weight loss, and the pt is agreeable.  Time spent with pt today was discussing the above.  Medications Added to Medication List This Visit: 1)  Lantus Solostar 100 Unit/ml Soln (Insulin glargine) .... Use asd 18 units subcutaneously once daily  Other Orders: Est. Patient Level III ([address]) DME Referral (DME)  Patient Instructions: 1)  will start on cpap.  Please call if tolerance issues. 2)  work on weight loss 3)  followup with me in 4 weeks.

## 2010-08-27 NOTE — Progress Notes (Signed)
Summary: nos appt  Phone Note Call from Patient   Caller: [email or web address]  Call For: clance Summary of Call: Rsc nos from 11/4 to 11/11. Initial call taken by: Darletta Moll,  June 01, 2010 9:40 AM

## 2010-08-27 NOTE — Assessment & Plan Note (Signed)
Summary: TOE PAIN--D/T--STC   Vital Signs:  Patient profile:   35 year old male Height:      74 inches Weight:      216.25 pounds BMI:     27.87 O2 Sat:      96 % on Room air Temp:     98.2 degrees F oral Pulse rate:   97 / minute BP sitting:   108 / 72  (left arm) Cuff size:   large  Vitals Entered By: Zella Ball Ewing CMA Duncan Dull) (February 17, 2010 8:04 AM)  O2 Flow:  Room air CC: Right toe pain/Re   Primary Care Provider:  [name]  CC:  Right toe pain/Re.  History of Present Illness: here 3 days after stubbed toed very hard th toe right foot on metal bench  at [place] [employer],  so hard had to go home;  hobbling every since, but ibuprofen no help, pain swelling persistent,  pain still about 7/10.  did not try ice,but did try to elevate;  no other injury, no actual fall or fever, red streaks or chills.  Has several callouses to te rigtht plantar foot lately the way he walks.   Has screw from previous right foot surgury and ? seems to him to be coming out?  Never went to the ER after last visit , got lantus from his sister and used the humalog, today overall much improved - cbg this am 100 No  further lethagy, weakness, n/v, fever, ST, cough and Pt denies CP, sob, doe, wheezing, orthopnea, pnd, worsening LE edema, palps, dizziness or syncope  Pt denies new neuro symptoms such as headache, facial or extremity weakness  Pt denies polydipsia, polyuria, or low sugar symptoms such as shakiness improved with eating.  Overall good compliance with meds, trying to follow low chol, DM diet, wt stable, little excercise however   Problems Prior to Update: 1)  Thrush  (ICD-112.0) 2)  Frequency, Urinary  (ICD-788.41) 3)  Obstructive Sleep Apnea  (ICD-327.23) 4)  Back Pain  (ICD-724.5) 5)  Finger Pain  (ICD-729.5) 6)  Hypersomnia  (ICD-780.54) 7)  Heartburn  (ICD-787.1) 8)  Hemorrhage of Rectum and Anus  (ICD-569.3) 9)  Ibs  (ICD-564.1) 10)  Hematochezia  (ICD-578.1) 11)  Hepatotoxicity,  Drug-induced, Risk of  (ICD-V58.69) 12)  Bunions, Right Foot  (ICD-727.1) 13)  Sciatica, Right  (ICD-724.3) 14)  Sinusitis- Acute-nos  (ICD-461.9) 15)  Contact With or Exposure To Venereal Diseases  (ICD-V01.6) 16)  Dysuria  (ICD-788.1) 17)  Family History of Cad Male 1st Degree Relative <60  (ICD-V16.49) 18)  Hypertension  (ICD-401.9) 19)  Fracture, Index Finger, Right  (ICD-816.00) 20)  Wrist Pain, Right  (ICD-719.43) 21)  Chest Pain  (ICD-786.50) 22)  Hand Pain, Right  (ICD-729.5) 23)  Substance Abuse, Multiple  (ICD-305.90) 24)  Preventive Health Care  (ICD-V70.0) 25)  Dyspnea/shortness of Breath  (ICD-786.09) 26)  Lung Nodule  (ICD-518.89) 27)  Shoulder Pain, Right  (ICD-719.41) 28)  Wrist Pain, Left  (ICD-719.43) 29)  Preventive Health Care  (ICD-V70.0) 30)  Hypogonadism, 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 1po Once Daily 2)  Fish Oil 1000 Mg  Caps (Omega-3 Fatty Acids) .[name].. 1 By Mouth Qd 3)  Daily Vitamins   Tabs (Multiple Vitamin) .[name].. 1 By Mouth Qd 4)  Onetouch Lancets   Misc (Lancets) .... Check Bld Sugar Qid 5)  Symbicort 160-4.5 Mcg/act Aero (Budesonide-Formoterol Fumarate) .... 2 Puffs Two Times A Day 6)  Freestyle Lite Test  Strp (Glucose Blood) .... Use Asd 1 Once Daily 7)  Celexa 10 Mg Tabs (Citalopram Hydrobromide) .[name].. 1 By Mouth Once Daily 8)  Aspirin 81 Mg  Tabs (Aspirin) .... Daily 9)  Ibuprofen 200 Mg Tabs (Ibuprofen) .... As Needed 10)  Simvastatin 80 Mg Tabs (Simvastatin) .[name].. 1po Once Daily 11)  Amlodipine Besylate 5 Mg Tabs  (Amlodipine Besylate) .[name].. 1 By Mouth Once Daily 12)  Flexeril 5 Mg Tabs (Cyclobenzaprine Hcl) .[name].. 1 By Mouth Three Times A Day As Needed 13)  Nexium 40 Mg  Cpdr (Esomeprazole Magnesium) .[name].. 1 Capsule Each Day 30 Minutes Before Meal 14)  Glimepiride 1 Mg Tabs (Glimepiride) .[name].. 1p O Two Times A Day As Needed Blood Sugar > 200 15)  Lantus Solostar 100 Unit/ml Soln (Insulin Glargine) .... Use Asd 40 Units Subcutaneously Once Daily 16)  Lantus Solostar Needles .... Use Asd 1 Once Daily 17)  Humalog Kwikpen 100 Unit/ml Soln (Insulin Lispro (Human)) .... Use Asd Ssi 18)  Humalog Kwikpen Needle .... Use Asd Three Times A Day 19)  Nystatin 100000 Unit/ml Susp (Nystatin) .... 5 Cc By Mouth Four Times Per Day For 10 Days  Current Medications (verified): 1)  Lisinopril 20 Mg Tabs (Lisinopril) .[name].. 1po Once Daily 2)  Fish Oil 1000 Mg  Caps (Omega-3 Fatty Acids) .[name].. 1 By Mouth Qd 3)  Daily Vitamins   Tabs (Multiple Vitamin) .[name].. 1 By Mouth Qd 4)  Onetouch Lancets   Misc (Lancets) .... Check Bld Sugar Qid 5)  Symbicort 160-4.5 Mcg/act Aero (Budesonide-Formoterol Fumarate) .... 2 Puffs Two Times A Day 6)  Freestyle Lite Test  Strp (Glucose Blood) .... Use Asd 1 Once Daily 7)  Celexa 10 Mg Tabs (Citalopram Hydrobromide) .[name].. 1 By Mouth Once Daily 8)  Aspirin 81 Mg  Tabs (Aspirin) .... Daily 9)  Ibuprofen 200 Mg Tabs (Ibuprofen) .... As Needed 10)  Simvastatin 80 Mg Tabs (Simvastatin) .[name].. 1po Once Daily 11)  Amlodipine Besylate 5 Mg Tabs (Amlodipine Besylate) .[name].. 1 By Mouth Once Daily 12)  Flexeril 5 Mg Tabs (Cyclobenzaprine Hcl) .[name].. 1 By Mouth Three Times A Day As Needed 13)  Nexium 40 Mg  Cpdr (Esomeprazole Magnesium) .[name].. 1 Capsule Each Day 30 Minutes Before Meal 14)  Glimepiride 1 Mg Tabs (Glimepiride) .[name].. 1p O Two Times A Day As Needed Blood Sugar > 200 15)  Lantus Solostar 100 Unit/ml Soln (Insulin Glargine) .... Use Asd 40 Units Subcutaneously Once Daily 16)  Lantus Solostar Needles .... Use  Asd 1 Once Daily 17)  Humalog Kwikpen 100 Unit/ml Soln (Insulin Lispro (Human)) .... Use Asd Ssi 18)  Humalog Kwikpen Needle .... Use Asd Three Times A Day 19)  Nystatin 100000 Unit/ml Susp (Nystatin) .... 5 Cc By Mouth Four Times Per Day For 10 Days  Allergies (verified): No Known Drug Allergies  Past History:  Past Medical History: Last updated: 06/10/2009 1. Depression 2. Diabetes mellitus, type II 3. Hyperlipidemia 4. Hypertension 5. Headaches 6. Osteoarthritis 7. Hx of Kidney Stones 8. polysubstance use - cocaine, marijuana (quit summer, 2010) 9. Tobacco abuse 10. Asthma 11. Echo (  6/10): EF 55-60% with mild LV hypertrophy, no regional wall motion abnormalities, normal diastolic function, normal RV, no pericardial effusion, normal PA systolic pressure.  12.  Chest pain: Possible episode of acute pericarditis in 6/10. ETT (7/10): 9', 10.1 METS, developed chest pain but no ST segment changes.  Negative study.  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  Started at age 82. 1 pack cigarettes/week.  Quit Dec 2009. 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   Head:  normocephalic and atraumatic.   Eyes:  vision grossly intact, pupils equal, and pupils round.   Ears:  R ear normal and L ear normal.   Nose:  no external deformity and no nasal discharge.   Mouth:  no gingival abnormalities and pharynx pink and moist.   Neck:  supple and no masses.    Lungs:  normal respiratory effort and normal breath sounds.   Heart:  normal rate and regular rhythm.   Msk:  right 5th toe with 3+ tender, red, swollen Extremities:  no edema, no erythema  Neurologic:  right foot o/w neurovasc intact   Impression & Recommendations:  Problem # 1:  TOE PAIN (ICD-729.5)  for film today, tramadol as needed   Orders: T-Toe(s) (73660TC)  Problem # 2:  BUNIONS, RIGHT FOOT (ICD-727.1)  with pain and callouses and ? screw looosening per pt  - for f/u with podiatry  Orders: Podiatry Referral (Podiatry)  Problem # 3:  HYPERTENSION (ICD-401.9)  His updated medication list for this problem includes:    Lisinopril 20 Mg Tabs (Lisinopril) .[name]... 1po once daily    Amlodipine Besylate 5 Mg Tabs (Amlodipine besylate) .[name]... 1 by mouth once daily  BP today: 108/72 Prior BP: 130/84 (02/11/2010)  Prior 10 Yr Risk Heart Disease: 6 % (02/17/2009)  Labs Reviewed: K+: 4.7 (11/13/2009) Creat: : 1.3 (11/13/2009)   Chol: 194 (11/13/2009)   HDL: 40.50 (11/13/2009)   LDL: 94 (08/07/2009)   TG: 325.0 (11/13/2009) stable overall by hx and exam, ok to continue meds/tx as is   Problem # 4:  DIABETES MELLITUS, TYPE II (ICD-250.00)  His updated medication list for this problem includes:    Lisinopril 20 Mg Tabs (Lisinopril) .[name]... 1po once daily    Aspirin 81 Mg Tabs (Aspirin) .[name]... Daily    Glimepiride 1 Mg Tabs (Glimepiride) .[name]... 1p o two times a day as needed blood sugar > 200    Lantus Solostar 100 Unit/ml Soln (Insulin glargine) ..... Use asd 40 units subcutaneously once daily    Humalog Kwikpen 100 Unit/ml Soln (Insulin lispro (human)) ..... Use asd ssi  Labs Reviewed: Creat: 1.3 (11/13/2009)    Reviewed HgBA1c results: 12.8 (11/13/2009)  6.8 (08/07/2009) re-started med - doing well sympotmatically, delcines labs today or ER eval;  Pt to cont DM diet, excercise, wt loss efforts;  Complete Medication List: 1)  Lisinopril 20 Mg Tabs (Lisinopril) .[name].. 1po  once daily 2)  Fish Oil 1000 Mg Caps (Omega-3 fatty acids) .Marland KitchenMarland KitchenMarland Kitchen 1  by mouth qd 3)  Daily Vitamins Tabs (Multiple vitamin) .[name].. 1 by mouth qd 4)  Onetouch Lancets Misc (Lancets) .... Check bld sugar qid 5)  Symbicort 160-4.5 Mcg/act Aero (Budesonide-formoterol fumarate) .... 2 puffs two times a day 6)  Freestyle Lite Test Strp (Glucose blood) .... Use asd 1 once daily 7)  Celexa 10 Mg Tabs (Citalopram hydrobromide) .[name].. 1 by mouth once daily 8)  Aspirin 81 Mg Tabs (Aspirin) .... Daily 9)  Ibuprofen 200 Mg Tabs (Ibuprofen) .... As needed 10)  Simvastatin 80 Mg Tabs (Simvastatin) .[name].. 1po once daily 11)  Amlodipine Besylate 5 Mg Tabs (Amlodipine besylate) .[name].. 1 by mouth once daily 12)  Flexeril 5 Mg Tabs (Cyclobenzaprine hcl) .[name].. 1 by mouth three times a day as needed 13)  Nexium 40 Mg Cpdr (Esomeprazole magnesium) .[name].. 1 capsule each day 30 minutes before meal 14)  Glimepiride 1 Mg Tabs (Glimepiride) .[name].. 1p o two times a day as needed blood sugar > 200 15)  Lantus Solostar 100 Unit/ml Soln (Insulin glargine) .... Use asd 40 units subcutaneously once daily 16)  Lantus Solostar Needles  .... Use asd 1 once daily 17)  Humalog Kwikpen 100 Unit/ml Soln (Insulin lispro (human)) .... Use asd ssi 18)  Humalog Kwikpen Needle  .... Use asd three times a day 19)  Nystatin 100000 Unit/ml Susp (Nystatin) .... 5 cc by mouth four times per day for 10 days 20)  Tramadol Hcl 50 Mg Tabs (Tramadol hcl) .[name].. 1 po q 6 hrs as needed  Patient Instructions: 1)  Please take all new medications as prescribed  - the tramadol pain medication sent to your pharmacy 2)  Please go to Radiology in the basement level for your X-Ray today  3)  Continue all previous medications as before this visit  4)  Please schedule a follow-up appointment in 2 months with: 5)  BMP prior to visit, ICD-9: 250.02 6)  Lipid Panel prior to visit, ICD-9: 7)  HbgA1C prior to visit, ICD-9: Prescriptions: TRAMADOL HCL 50 MG TABS (TRAMADOL HCL)  1 po q 6 hrs as needed  #60 x 1   Entered and Authorized by:   [name]   Signed by:   [name] on 02/17/2010   Method used:   Electronically to        [facility] [address] [phone]* (retail)       [address]       [place], [place]  [address]       Ph: [phone] or [phone]       Fax: [phone]   RxID:   [id number]

## 2010-08-27 NOTE — Letter (Signed)
Summary: External Correspondence  External Correspondence   Imported By: Valinda Hoar 10/27/2009 15:36:03  _____________________________________________________________________  External Attachment:    Type:   Image     Comment:   External Document

## 2010-08-27 NOTE — Assessment & Plan Note (Signed)
Summary: 6 wk f/.u//cd   Vital Signs:  Patient profile:   35 year old male Height:      74 inches Weight:      216.25 pounds BMI:     27.87 O2 Sat:      96 % on Room air Temp:     98.7 degrees F oral Pulse rate:   93 / minute BP sitting:   130 / 84  (left arm) Cuff size:   large  Vitals Entered By: Zella Ball Ewing CMA (AAMA) (February 11, 2010 3:00 PM)  O2 Flow:  Room air CC: 6 week followup/RE   Primary Care Provider:  [name]  CC:  6 week followup/RE.  History of Present Illness: here to f/u;  out of insulin for 5 days;  feels terrible todya with general weakness,  cotton mouth, dizzy, sores on the sides of the mouth with discomfort; + polydipsia and polyuria - 2 gallons water per day.  Pt denies CP, sob, doe, wheezing, orthopnea, pnd, worsening LE edema, palps, syncope. Pt denies new neuro symptoms such as headache, facial or extremity weakness  No fever, night sweats, loss of appetite or other constitutional symptoms , except for wt loss from 225 to current 216 today.  Had some reflux type burning, but no n/v.  Asks for samples of all meds including PPI.  Lethargic today. weak.  CBG at home this am 510  Problems Prior to Update: 1)  Thrush  (ICD-112.0) 2)  Frequency, Urinary  (ICD-788.41) 3)  Obstructive Sleep Apnea  (ICD-327.23) 4)  Back Pain  (ICD-724.5) 5)  Finger Pain  (ICD-729.5) 6)  Hypersomnia  (ICD-780.54) 7)  Heartburn  (ICD-787.1) 8)  Hemorrhage of Rectum and Anus  (ICD-569.3) 9)  Ibs  (ICD-564.1) 10)  Hematochezia  (ICD-578.1) 11)  Hepatotoxicity, Drug-induced, Risk of  (ICD-V58.69) 12)  Bunions, Right Foot  (ICD-727.1) 13)  Sciatica, Right  (ICD-724.3) 14)  Sinusitis- Acute-nos  (ICD-461.9) 15)  Contact With or Exposure To Venereal Diseases  (ICD-V01.6) 16)  Dysuria  (ICD-788.1) 17)  Family History of Cad Male 1st Degree Relative <60  (ICD-V16.49) 18)  Hypertension  (ICD-401.9) 19)  Fracture, Index Finger, Right  (ICD-816.00) 20)  Wrist Pain, Right   (ICD-719.43) 21)  Chest Pain  (ICD-786.50) 22)  Hand Pain, Right  (ICD-729.5) 23)  Substance Abuse, Multiple  (ICD-305.90) 24)  Preventive Health Care  (ICD-V70.0) 25)  Dyspnea/shortness of Breath  (ICD-786.09) 26)  Lung Nodule  (ICD-518.89) 27)  Shoulder Pain, Right  (ICD-719.41) 28)  Wrist Pain, Left  (ICD-719.43) 29)  Preventive Health Care  (ICD-V70.0) 30)  Hypogonadism, 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 1po Once Daily 2)  Fish Oil 1000 Mg  Caps (Omega-3 Fatty Acids) .[name].. 1 By Mouth Qd 3)  Daily Vitamins   Tabs (Multiple Vitamin) .[name].. 1 By Mouth Qd 4)  Onetouch Lancets   Misc (Lancets) .... Check Bld Sugar Qid 5)  Symbicort 160-4.5 Mcg/act Aero (Budesonide-Formoterol Fumarate) .... 2 Puffs Two Times A Day 6)  Freestyle Lite Test  Strp (Glucose Blood) .... Use Asd 1 Once  Daily 7)  Celexa 10 Mg Tabs (Citalopram Hydrobromide) .[name].. 1 By Mouth Once Daily 8)  Aspirin 81 Mg  Tabs (Aspirin) .... Daily 9)  Ibuprofen 200 Mg Tabs (Ibuprofen) .... As Needed 10)  Simvastatin 80 Mg Tabs (Simvastatin) .[name].. 1po Once Daily 11)  Amlodipine Besylate 5 Mg Tabs (Amlodipine Besylate) .[name].. 1 By Mouth Once Daily 12)  Flexeril 5 Mg Tabs (Cyclobenzaprine Hcl) .[name].. 1 By Mouth Three Times A Day As Needed 13)  Nexium 40 Mg  Cpdr (Esomeprazole Magnesium) .[name].. 1 Capsule Each Day 30 Minutes Before Meal 14)  Glimepiride 1 Mg Tabs (Glimepiride) .[name].. 1p O Two Times A Day As Needed Blood Sugar > 200 15)  Lantus Solostar 100 Unit/ml Soln (Insulin Glargine) .... Use Asd 40 Units Subcutaneously  Once Daily 16)  Lantus Solostar Needles .... Use Asd 1 Once Daily 17)  Humalog Kwikpen 100 Unit/ml Soln (Insulin Lispro (Human)) .... Use Asd Ssi 18)  Humalog Kwikpen Needle .... Use Asd Three Times A Day  Current Medications (verified): 1)  Lisinopril 20 Mg Tabs (Lisinopril) .[name].. 1po Once Daily 2)  Fish Oil 1000 Mg  Caps (Omega-3 Fatty Acids) .[name].. 1 By Mouth Qd 3)  Daily Vitamins   Tabs (Multiple Vitamin) .[name].. 1 By Mouth Qd 4)  Onetouch Lancets   Misc (Lancets) .... Check Bld Sugar Qid 5)  Symbicort 160-4.5 Mcg/act Aero (Budesonide-Formoterol Fumarate) .... 2 Puffs Two Times A Day 6)  Freestyle Lite Test  Strp (Glucose Blood) .... Use Asd 1 Once Daily 7)  Celexa 10 Mg Tabs (Citalopram Hydrobromide) .[name].. 1 By Mouth Once Daily 8)  Aspirin 81 Mg  Tabs (Aspirin) .... Daily 9)  Ibuprofen 200 Mg Tabs (Ibuprofen) .... As Needed 10)  Simvastatin 80 Mg Tabs (Simvastatin) .[name].. 1po Once Daily 11)  Amlodipine Besylate 5 Mg Tabs (Amlodipine Besylate) .[name].. 1 By Mouth Once Daily 12)  Flexeril 5 Mg Tabs (Cyclobenzaprine Hcl) .[name].. 1 By Mouth Three Times A Day As Needed 13)  Nexium 40 Mg  Cpdr (Esomeprazole Magnesium) .[name].. 1 Capsule Each Day 30 Minutes Before Meal 14)  Glimepiride 1 Mg Tabs (Glimepiride) .[name].. 1p O Two Times A Day As Needed Blood Sugar > 200 15)  Lantus Solostar 100 Unit/ml Soln (Insulin Glargine) .... Use Asd 40 Units Subcutaneously Once Daily 16)  Lantus Solostar Needles .... Use Asd 1 Once Daily 17)  Humalog Kwikpen 100 Unit/ml Soln (Insulin Lispro (Human)) .... Use Asd Ssi 18)  Humalog Kwikpen Needle .... Use Asd Three Times A Day  Allergies (verified): No Known Drug Allergies  Past History:  Past Medical History: Last updated: 06/10/2009 1. Depression 2. Diabetes mellitus, type II 3. Hyperlipidemia 4. Hypertension 5. Headaches 6. Osteoarthritis 7. Hx of Kidney Stones 8. polysubstance use - cocaine, marijuana (quit summer, 2010) 9. Tobacco abuse 10. Asthma 11. Echo  (6/10): EF 55-60% with mild LV hypertrophy, no regional wall motion abnormalities, normal diastolic function, normal RV, no pericardial effusion, normal PA systolic pressure.  12.  Chest pain: Possible episode of acute pericarditis in 6/10. ETT (7/10): 9', 10.1 METS, developed chest pain but no ST segment changes.  Negative study.  Lumbar Disc disease with sciatica right bunion   Past Surgical History: Last updated: 08/07/2009 L Knee Surgery- 1995  Bunionectomy (Right big toe and pinky toe)--06/2009  Family History: Last updated: 07/16/2009 Mother with CABG in her 75s.  Uncle with "heart problems" in his 40s.  Family History Diabetes 1st degree relative Family History High cholesterol Family History of Stroke F  1st degree relative <60   emphysema: maternal uncle allergies: sister asthma: sister, mother heart disease: mother clotting disorders: mother (stroke) father: DM, HTN  Social History: Last updated: 07/16/2009 Former smoker.  Started at age 37. 1 pack cigarettes/week.  Quit Dec 2009. 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   Head:  normocephalic and atraumatic.   Eyes:  vision grossly intact, pupils equal, and pupils round.   Ears:  R ear normal and L ear normal.   Nose:  no external deformity and no nasal discharge.   Mouth:  tongue erythema c/w red thrush, pharynx benign Neck:  supple and no masses.   Lungs:  normal respiratory effort and normal breath sounds.   Heart:  normal rate and regular rhythm.   Abdomen:  soft, non-tender, and normal bowel sounds.   Extremities:  no edema, no erythema    Impression &  Recommendations:  Problem # 1:  DIABETES MELLITUS, TYPE II (ICD-250.00)  His updated medication list for this problem includes:    Lisinopril 20 Mg Tabs (Lisinopril) .[name]... 1po once daily    Aspirin 81 Mg Tabs (Aspirin) .[name]... Daily    Glimepiride 1 Mg Tabs (Glimepiride) .[name]... 1p o two times a day as needed blood sugar > 200    Lantus Solostar 100 Unit/ml Soln (Insulin glargine) ..... Use asd 40 units subcutaneously once daily    Humalog Kwikpen 100 Unit/ml Soln (Insulin lispro (human)) ..... Use asd ssi very high CBG this am, for cbg check in the office but suspect also early ketoacidosis;  will give pt some samples but should go directly to ER for further evaluation to r/o ketoacidosis, IVFs  Problem # 2:  THRUSH (ICD-112.0) for nystatin oral susp asd  Problem # 3:  HEARTBURN (ICD-787.1) for PPI samples today - omeprazole  Complete Medication List: 1)  Lisinopril 20 Mg Tabs (Lisinopril) .[name].. 1po once daily 2)  Fish Oil 1000 Mg Caps (Omega-3 fatty acids) .[name].. 1 by mouth qd 3)  Daily Vitamins Tabs (Multiple vitamin) .[name].. 1 by mouth qd 4)  Onetouch Lancets Misc (Lancets) .... Check bld sugar qid 5)  Symbicort 160-4.5 Mcg/act Aero (Budesonide-formoterol fumarate) .... 2 puffs two times a day 6)  Freestyle Lite Test Strp (Glucose blood) .... Use asd 1 once daily 7)  Celexa 10 Mg Tabs (Citalopram hydrobromide) .[name].. 1 by mouth once daily 8)  Aspirin 81 Mg Tabs (Aspirin) .... Daily 9)  Ibuprofen 200 Mg Tabs (Ibuprofen) .... As needed 10)  Simvastatin 80 Mg Tabs (Simvastatin) .[name].. 1po once daily 11)  Amlodipine Besylate 5 Mg Tabs (Amlodipine besylate) .[name].. 1 by mouth once daily 12)  Flexeril 5 Mg Tabs (Cyclobenzaprine hcl) .[name].. 1 by mouth three times a day as needed 13)  Nexium 40 Mg Cpdr (Esomeprazole magnesium) .[name].. 1 capsule each day 30 minutes before meal 14)  Glimepiride 1 Mg Tabs (Glimepiride) .[name].. 1p o two times a day as needed blood sugar > 200 15)  Lantus Solostar 100 Unit/ml Soln  (Insulin glargine) .... Use asd 40 units subcutaneously once daily 16)  Lantus Solostar Needles  .... Use asd 1 once daily 17)  Humalog  Kwikpen 100 Unit/ml Soln (Insulin lispro (human)) .... Use asd ssi 18)  Humalog Kwikpen Needle  .... Use asd three times a day 19)  Nystatin 100000 Unit/ml Susp (Nystatin) .... 5 cc by mouth four times per day for 10 days  Patient Instructions: 1)  your blood sugar today in the office is: "high" 2)  you are given humalog : 10 units at 345PM 3)  you are given samples of humalog today 4)  please go directly to the ER as you will need evaluation for acidosis and dehydration 5)  Please take all new medications as prescribed  - the solution for the thrush 6)  Continue all previous medications as before this visit  7)  Please schedule a follow-up appointment as needed. Prescriptions: NYSTATIN 100000 UNIT/ML SUSP (NYSTATIN) 5 cc by mouth four times per day for 10 days  #10d supply x 0   Entered and Authorized by:   [name]   Signed by:   [name] on 02/11/2010   Method used:   Print then Give to Patient   RxID:   [id number]

## 2010-08-27 NOTE — Assessment & Plan Note (Signed)
Summary: back pain/#/cd   Vital Signs:  Patient profile:   35 year old male Height:      74 inches Weight:      195.50 pounds BMI:     25.19 O2 Sat:      95 % on Room air Temp:     97.8 degrees F oral Pulse rate:   91 / minute BP sitting:   110 / 70  (left arm) Cuff size:   large  Vitals Entered By: Zella Ball Ewing CMA [facility]) (July 10, 2010 9:01 AM)  O2 Flow:  Room air CC: Back Pain/RE   Primary Care Provider:  [name]  CC:  Back Pain/RE.  History of Present Illness: here to f/u; c/o ongoing recurring chronic LBP  with occasionla mild flares for the past yr, seemed worse yesteday but slept well last night with 800 mg ibuprofen; no bowel or bladder changes, no worsening LE pain/weak/numb, fever or recent wt loss.  Pt denies CP, worsening sob, doe, wheezing, orthopnea, pnd, worsening LE edema, palps, dizziness or syncope  Pt denies new neuro symptoms such as headache, facial or extremity weakness  Pt denies polydipsia, polyuria, or low sugar symptoms such as shakiness improved with eating.  Overall fair compliance with meds though able to afford better now that he has a med discount program, trying to follow low chol, DM diet, wt stable, little excercise however    Not yet taking the lipitor yet but plans to start now.    Preventive Screening-Counseling & Management      Drug Use:  no.    Problems Prior to Update: 1)  Gerd  (ICD-530.81) 2)  Uri  (ICD-465.9) 3)  Toe Pain  (ICD-729.5) 4)  Thrush  (ICD-112.0) 5)  Frequency, Urinary  (ICD-788.41) 6)  Obstructive Sleep Apnea  (ICD-327.23) 7)  Back Pain  (ICD-724.5) 8)  Finger Pain  (ICD-729.5) 9)  Hypersomnia  (ICD-780.54) 10)  Heartburn  (ICD-787.1) 11)  Hemorrhage of Rectum and Anus  (ICD-569.3) 12)  Ibs  (ICD-564.1) 13)  Hematochezia  (ICD-578.1) 14)  Hepatotoxicity, Drug-induced, Risk of  (ICD-V58.69) 15)  Bunions, Right Foot  (ICD-727.1) 16)  Sciatica, Right  (ICD-724.3) 17)  Sinusitis- Acute-nos   (ICD-461.9) 18)  Contact With or Exposure To Venereal Diseases  (ICD-V01.6) 19)  Dysuria  (ICD-788.1) 20)  Family History of Cad Male 1st Degree Relative <60  (ICD-V16.49) 21)  Hypertension  (ICD-401.9) 22)  Fracture, Index Finger, Right  (ICD-816.00) 23)  Wrist Pain, Right  (ICD-719.43) 24)  Chest Pain  (ICD-786.50) 25)  Hand Pain, Right  (ICD-729.5) 26)  Substance Abuse, Multiple  (ICD-305.90) 27)  Preventive Health Care  (ICD-V70.0) 28)  Dyspnea/shortness of Breath  (ICD-786.09) 29)  Lung Nodule  (ICD-518.89) 30)  Shoulder Pain, Right  (ICD-719.41) 31)  Wrist Pain, Left  (ICD-719.43) 32)  Preventive Health Care  (ICD-V70.0) 33)  Hypogonadism, 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 Check Bld Sugar Qid 2)  Freestyle Lite Test  Strp (Glucose Blood) .... Use Asd 1 Once Daily 3)  Glimepiride 1 Mg Tabs (Glimepiride) .[name].. 1po  Two Times A Day As Needed Blood Sugar > 200 4)  Lantus Solostar 100 Unit/ml Soln (Insulin Glargine) .... Use Asd 40 Units Subcutaneously Once Daily 5)  Humalog Kwikpen 100 Unit/ml Soln (Insulin Lispro (Human)) .... Use Asd Ssi 6)  Sertraline Hcl 100 Mg Tabs (Sertraline Hcl) .[name].. 1 By Mouth Once Daily 7)  Lipitor 10 Mg Tabs (Atorvastatin Calcium) .[name].. 1po Once Daily  Current Medications (verified): 1)  Onetouch Lancets   Misc (Lancets) .... Check Bld Sugar Qid 2)  Freestyle Lite Test  Strp (Glucose Blood) .... Use Asd 1 Once Daily 3)  Glimepiride 1 Mg Tabs (Glimepiride) .[name].. 1po Two Times A Day As Needed Blood Sugar > 200 4)  Lantus Solostar 100  Unit/ml Soln (Insulin Glargine) .... Use Asd 40 Units Subcutaneously Once Daily 5)  Humalog Kwikpen 100 Unit/ml Soln (Insulin Lispro (Human)) .... Use Asd Ssi 6)  Sertraline Hcl 100 Mg Tabs (Sertraline Hcl) .[name].. 1 By Mouth Once Daily 7)  Lipitor 10 Mg Tabs (Atorvastatin Calcium) .[name].. 1po Once Daily 8)  Nexium 40 Mg Cpdr (Esomeprazole Magnesium) .[name].. 1po Once Daily 9)  Nystatin 100000 Unit/ml Susp (Nystatin) .... 5 Cc By Mouth Four Time Per Day As Needed 10)  Ibuprofen 800 Mg Tabs (Ibuprofen) .[name].. 1po Q 6 Hrs As Needed  Allergies (verified): No Known Drug Allergies  Past History:  Past Surgical History: Last updated: 08/07/2009 L Knee Surgery- 1995  Bunionectomy (Right big toe and pinky toe)--06/2009  Social History: Last updated: 07/10/2010 Former smoker.  Started at age 40. 1 pack cigarettes/week.  Quit Dec 2009. 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 Depression 2. Diabetes mellitus, type II 3. Hyperlipidemia 4. Hypertension 5. Headaches 6. Osteoarthritis 7. Hx of Kidney Stones 8. polysubstance use - cocaine, marijuana (quit summer, 2010) 9. Tobacco abuse 10. Asthma 11. Echo (6/10): EF 55-60% with mild LV hypertrophy, no regional wall motion abnormalities, normal diastolic function, normal RV, no pericardial effusion, normal PA systolic pressure.  12.  Chest pain: Possible episode of acute pericarditis in 6/10. ETT (7/10): 9', 10.1 METS, developed chest pain but no ST segment changes.  Negative study.  Lumbar Disc disease with sciatica right bunion  GERD  Social History: Former smoker.  Started at age 88. 1 pack cigarettes/week.  Quit Dec 2009. 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   Head:  normocephalic and atraumatic.   Eyes:  vision grossly intact, pupils equal, and pupils round.   Ears:  R ear normal and L ear normal.   Nose:  no external deformity and no nasal discharge.   Mouth:  no gingival abnormalities and pharynx pink and moist.  , + thrush recurrent to tongue Neck:  supple and no masses.   Lungs:  normal respiratory effort and normal breath sounds.   Heart:  normal rate and regular rhythm.   Abdomen:  soft, non-tender, and normal bowel sounds.  Msk:  no spine tender or paravertebral tender Extremities:  no edema, no erythema Neurologic:  strength normal in all extremities and gait normal.   Psych:  not depressed appearing and moderately anxious.     Impression & Recommendations:  Problem # 1:  BACK PAIN (ICD-724.5)  with known lumbar disc dz, and sciatica in the past, exam benign, ok for ibuprofen 800 mg as needed ;    His updated medication list for this problem includes:    Ibuprofen 800 Mg Tabs (Ibuprofen) .[name]... 1po q 6 hrs as needed  Problem # 2:  THRUSH (ICD-112.0) better, then recurrent now - for nystatin soln asd   Problem # 3:  DEPRESSION (ICD-311)  His updated medication list for this problem includes:    Sertraline Hcl 100 Mg Tabs (Sertraline hcl) .[name]... 1 by mouth once daily now has a drug discount card , stable overall by hx and exam, ok to continue meds/tx as is ,  also to see counseling later today (susan bond)  Problem # 4:  DIABETES MELLITUS, TYPE II (ICD-250.00)  His updated medication list for this problem includes:    Glimepiride 1 Mg Tabs (Glimepiride) .[name]... 1po two times a day as needed blood sugar >  200    Lantus Solostar 100 Unit/ml Soln (Insulin glargine) ..... Use asd 40 units subcutaneously once daily    Humalog Kwikpen 100 Unit/ml Soln (Insulin lispro (human)) ..... Use asd ssi has been approved for pt assist for lantus, but has not received yet, and still working on the humalog pt assist   Labs Reviewed: Creat: 1.2 (06/05/2010)    Reviewed HgBA1c results: 14.3 (06/05/2010)  12.8 (11/13/2009)  Problem # 5:  GERD (ICD-530.81)  His updated medication list for this problem includes:    Nexium 40 Mg Cpdr (Esomeprazole magnesium) .[name]... 1po once daily treat as above, f/u any worsening signs or symptoms   Complete Medication List: 1)  Onetouch Lancets Misc (Lancets) .... Check bld sugar qid 2)  Freestyle Lite Test Strp (Glucose blood) .... Use asd 1 once daily 3)  Glimepiride 1 Mg Tabs (Glimepiride) .[name].. 1po two times a day as needed blood sugar > 200 4)  Lantus Solostar 100 Unit/ml Soln (Insulin glargine) .... Use asd 40 units subcutaneously once daily 5)  Humalog Kwikpen 100 Unit/ml Soln (Insulin lispro (human)) .... Use asd ssi 6)  Sertraline Hcl 100 Mg Tabs (Sertraline hcl) .[name].. 1 by mouth once daily 7)  Lipitor 10 Mg Tabs (Atorvastatin calcium) .[name].. 1po once daily 8)  Nexium 40 Mg Cpdr (Esomeprazole magnesium) .[name].. 1po once daily 9)  Nystatin 100000 Unit/ml Susp (Nystatin) .... 5 cc by mouth four time per day as needed 10)  Ibuprofen 800 Mg Tabs (Ibuprofen) .[name].. 1po q 6 hrs as needed  Patient Instructions: 1)  please return in 2 wks for labs as planned 2)  Please take all new medications as prescribed 3)  Continue all previous medications as before this visit  4)  Please schedule a follow-up appointment in 5 months as planned Prescriptions: IBUPROFEN 800 MG TABS (IBUPROFEN) 1po q 6 hrs as needed  #60 x 3   Entered and Authorized by:   [name]   Signed by:   [name] on 07/10/2010   Method used:   Print then Give to Patient   RxID:    [id number] NYSTATIN 100000 UNIT/ML SUSP (NYSTATIN) 5 cc by mouth four time per day as needed  #1 bottle x 5  Entered and Authorized by:   [name]   Signed by:   [name] on 07/10/2010   Method used:   Print then Give to Patient   RxID:   [id number] NEXIUM 40 MG CPDR (ESOMEPRAZOLE MAGNESIUM) 1po once daily  #90 x 3   Entered and Authorized by:   [name]   Signed by:   [name] on 07/10/2010   Method used:   Print then Give to Patient   RxID:   [id number] HUMALOG KWIKPEN 100 UNIT/ML SOLN (INSULIN LISPRO (HUMAN)) use asd SSI  #5pens x 11   Entered and Authorized by:   [name]   Signed by:   [name] on 07/10/2010   Method used:   Print then Give to Patient   RxID:   [id number]    Orders Added: 1)  Est. Patient Level IV [01027]

## 2010-08-27 NOTE — Assessment & Plan Note (Signed)
Summary: FU ON BS/NWS   Vital Signs:  Patient profile:   35 year old male Height:      74 inches Weight:      226 pounds BMI:     29.12 O2 Sat:      96 % on Room air Temp:     98.4 degrees F oral Pulse rate:   93 / minute BP sitting:   112 / 82  (left arm) Cuff size:   large  Vitals Entered ByZella Ball Ewing (November 13, 2009 10:04 AM)  O2 Flow:  Room air CC: Followup on Blood Sugar, fatigue, pelvic pain/RE   Primary Care Provider:  [name]  CC:  Followup on Blood Sugar, fatigue, and pelvic pain/RE.  History of Present Illness: here with blood sugars not doing well with several preprandial in the high 300's or 400's (one 570 recently as well), assoc with polyuria and urinary freq for the last 2 to 3 wks; as well as newer onset lower abd/pelvic/groin discomfort bilat  radaiting to the back, but without fever, chills, n/v, rash, joint pains, wt loss.  Does have marked polydipsia as well since last visit, worse in the last few days. Also wtih onset severe ST x 2 to 3 days, but without sinus pain, congesiton or ear pain, dizziness.  Has remarkably incrased fatigue as well.  He also notes that prior to last visit in jan 2011, he was acutally taking 18 units of lantus AND 18 units levemir with sugars in the low 100/'s and a1c 6.8 last visit.  Since then pt states has only been taking the lantus, but missed his 2 wk f/u after last visit.  Pt denies CP, sob, doe, wheezing, orthopnea, pnd, worsening LE edema, palps, dizziness or syncope   Pt denies new neuro symptoms such as headache, facial or extremity weakness .  Also has had mild right lower lateral backache, intermittent for several days, not assoc with other bowel or bladder change except for the above, no night sweats, radicular pain or LE numbness or weakness.  Worse to get up from chair or bend at waist.    Problems Prior to Update: 1)  Frequency, Urinary  (ICD-788.41) 2)  Pharyngitis-acute  (ICD-462) 3)  Obstructive Sleep Apnea   (ICD-327.23) 4)  Back Pain  (ICD-724.5) 5)  Finger Pain  (ICD-729.5) 6)  Hypersomnia  (ICD-780.54) 7)  Heartburn  (ICD-787.1) 8)  Hemorrhage of Rectum and Anus  (ICD-569.3) 9)  Ibs  (ICD-564.1) 10)  Hematochezia  (ICD-578.1) 11)  Hepatotoxicity, Drug-induced, Risk of  (ICD-V58.69) 12)  Bunions, Right Foot  (ICD-727.1) 13)  Sciatica, Right  (ICD-724.3) 14)  Sinusitis- Acute-nos  (ICD-461.9) 15)  Contact With or Exposure To Venereal Diseases  (ICD-V01.6) 16)  Dysuria  (ICD-788.1) 17)  Family History of Cad Male 1st Degree Relative <60  (ICD-V16.49) 18)  Hypertension  (ICD-401.9) 19)  Fracture, Index Finger, Right  (ICD-816.00) 20)  Wrist Pain, Right  (ICD-719.43) 21)  Chest Pain  (ICD-786.50) 22)  Hand Pain, Right  (ICD-729.5) 23)  Substance Abuse, Multiple  (ICD-305.90) 24)  Preventive Health Care  (ICD-V70.0) 25)  Dyspnea/shortness of Breath  (ICD-786.09) 26)  Lung Nodule  (ICD-518.89) 27)  Shoulder Pain, Right  (ICD-719.41) 28)  Wrist Pain, Left  (ICD-719.43) 29)  Preventive Health Care  (ICD-V70.0) 30)  Hypogonadism, Male  (ICD-257.2) 48)  Hepatotoxicity, Drug-induced, Risk of  (ICD-V58.69) 32)  Erectile Dysfunction  (ICD-302.72) 33)  Family History Diabetes 1st Degree Relative  (ICD-V18.0) 34)  Renal Calculus, Hx of  (ICD-V13.01) 35)  Osteoarthritis  (ICD-715.90) 36)  Headache  (ICD-784.0) 37)  Hyperlipidemia  (ICD-272.4) 38)  Diabetes Mellitus, Type II  (ICD-250.00) 39)  Depression  (ICD-311)  Medications Prior to Update: 1)  Lisinopril 20 Mg Tabs (Lisinopril) .[name].. 1po Once Daily 2)  Fish Oil 1000 Mg  Caps (Omega-3 Fatty Acids) .[name].. 1 By Mouth Qd 3)  Metformin Hcl 500 Mg  Tb24 (Metformin Hcl) .... 2 By Mouth Qam and 2 in The Pm 4)  Daily Vitamins   Tabs (Multiple Vitamin) .[name].. 1 By Mouth Qd 5)  Onetouch Lancets   Misc (Lancets) .... Check Bld Sugar Qid 6)  Symbicort 160-4.5 Mcg/act Aero (Budesonide-Formoterol Fumarate) .... 2 Puffs Two Times A Day 7)  Freestyle Lite  Test  Strp (Glucose Blood) .... Use Asd 1 Once Daily 8)  Celexa 10 Mg Tabs (Citalopram Hydrobromide) .[name].. 1 By Mouth Once Daily 9)  Aspirin 81 Mg  Tabs (Aspirin) .... Daily 10)  Ibuprofen 200 Mg Tabs (Ibuprofen) .... As Needed 11)  Simvastatin 80 Mg Tabs (Simvastatin) .[name].. 1po Once Daily 12)  Amlodipine Besylate 5 Mg Tabs (Amlodipine Besylate) .[name].. 1 By Mouth Once Daily 13)  Flexeril 5 Mg Tabs (Cyclobenzaprine Hcl) .[name].. 1 By Mouth Three Times A Day As Needed 14)  Nexium 40 Mg  Cpdr (Esomeprazole Magnesium) .[name].. 1 Capsule Each Day 30 Minutes Before Meal 15)  Glimepiride 1 Mg Tabs (Glimepiride) .[name].. 1p O Two Times A Day As Needed Blood Sugar > 200 16)  Lantus Solostar 100 Unit/ml Soln (Insulin Glargine) .... Use Asd 18 Units Subcutaneously Once Daily 17)  Lantus Solostar Needles .... Use Asd 1 Once Daily  Current Medications (verified): 1)  Lisinopril 20 Mg Tabs (Lisinopril) .[name].. 1po Once Daily 2)  Fish Oil 1000 Mg  Caps (Omega-3 Fatty Acids) .[name].. 1 By Mouth Qd 3)  Metformin Hcl 500 Mg  Tb24 (Metformin Hcl) .... 2 By Mouth Qam and 2 in The Pm 4)  Daily Vitamins   Tabs (Multiple Vitamin) .[name].. 1 By Mouth Qd 5)  Onetouch Lancets   Misc (Lancets) .... Check Bld Sugar Qid 6)  Symbicort 160-4.5 Mcg/act Aero (Budesonide-Formoterol Fumarate) .... 2 Puffs Two Times A Day 7)  Freestyle Lite Test  Strp (Glucose Blood) .... Use Asd 1 Once Daily 8)  Celexa 10 Mg Tabs (Citalopram Hydrobromide) .[name].. 1 By Mouth Once Daily 9)  Aspirin 81 Mg  Tabs (Aspirin) .... Daily 10)  Ibuprofen 200 Mg Tabs (Ibuprofen) .... As Needed 11)  Simvastatin 80 Mg Tabs (Simvastatin) .[name].. 1po Once Daily 12)  Amlodipine Besylate 5 Mg Tabs (Amlodipine Besylate) .[name].. 1 By Mouth Once Daily 13)  Flexeril 5 Mg Tabs (Cyclobenzaprine Hcl) .[name].. 1 By Mouth Three Times A Day As Needed 14)  Nexium 40 Mg  Cpdr (Esomeprazole Magnesium) .[name].. 1 Capsule Each Day 30 Minutes Before Meal 15)  Glimepiride 1 Mg Tabs (Glimepiride) .[name].. 1p O Two Times A  Day As Needed Blood Sugar > 200 16)  Lantus Solostar 100 Unit/ml Soln (Insulin Glargine) .... Use Asd 36 Units Subcutaneously Once Daily 17)  Lantus Solostar Needles .... Use Asd 1 Once Daily 18)  Levaquin 500 Mg Tabs (Levofloxacin) .[name].. 1po Once Daily  Allergies (verified): No Known Drug Allergies  Past History:  Past Medical History: Last updated: 06/10/2009 1. Depression 2. Diabetes mellitus, type II 3. Hyperlipidemia 4. Hypertension 5. Headaches 6. Osteoarthritis 7. Hx of Kidney Stones 8. polysubstance use - cocaine, marijuana (quit summer, 2010) 9. Tobacco  abuse 10. Asthma 11. Echo (6/10): EF 55-60% with mild LV hypertrophy, no regional wall motion abnormalities, normal diastolic function, normal RV, no pericardial effusion, normal PA systolic pressure.  12.  Chest pain: Possible episode of acute pericarditis in 6/10. ETT (7/10): 9', 10.1 METS, developed chest pain but no ST segment changes.  Negative study.  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  Started at age 37. 1 pack cigarettes/week.  Quit Dec 2009. 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   Eyes:  vision grossly intact, pupils equal, and pupils round.   Ears:  bilat tm's mild erythema Nose:  no external deformity and no nasal discharge.     Mouth:  pharyngeal erythema and fair dentition.   Neck:  supple and cervical lymphadenopathy.   Lungs:  normal respiratory effort and normal breath sounds.   Heart:  normal rate and regular rhythm.   Abdomen:  soft, non-tender, and normal bowel sounds.   Msk:  no joint tenderness and no joint swelling. ; has mild right side and paravertebral lumber area tenderness without severe spasm , swelling or rash Extremities:  no edema, no erythema  Neurologic:  alert & oriented X3, strength grossly normal in all extremities, and gait normal.   Skin:  color normal and no rashes.   Psych:  normally interactive and moderately anxious.     Impression & Recommendations:  Problem # 1:  PHARYNGITIS-ACUTE (ICD-462)  His updated medication list for this problem includes:    Aspirin 81 Mg Tabs (Aspirin) .[name]... Daily    Ibuprofen 200 Mg Tabs (Ibuprofen) .[name]... As needed    Levaquin 500 Mg Tabs (Levofloxacin) .[name]... 1po once daily treat as above, f/u any worsening signs or symptoms   Problem # 2:  FREQUENCY, URINARY (ICD-788.41) to check urine studies, cant r/o UTI as aggrevating acute factor for elev blood sugars - levaquin likely should cover for this as well Orders: T-Culture, Urine ([address]-60454) TLB-Udip w/ Micro (81001-URINE)  Problem # 3:  BACK PAIN (ICD-724.5)  His updated medication list for this problem includes:    Aspirin 81 Mg Tabs (Aspirin) .[name]... Daily    Ibuprofen 200 Mg Tabs (Ibuprofen) .[name]... As needed    Flexeril 5 Mg Tabs (Cyclobenzaprine hcl) .[name]... 1 by mouth three times a day as needed I suspect MSK strain  but cant r/o UTI related - treat as above, f/u any worsening signs or symptoms ; doubt spine or disc problem or need for imaging at this time  Problem # 4:  DIABETES MELLITUS, TYPE II (ICD-250.00)  His updated medication list for this problem includes:    Lisinopril 20 Mg Tabs (Lisinopril) .[name]... 1po once daily    Metformin Hcl 500 Mg Tb24 (Metformin hcl) .[name]... 2 by mouth qam  and 2 in the pm    Aspirin 81 Mg Tabs (Aspirin) .[name]... Daily  Glimepiride 1 Mg Tabs (Glimepiride) .[name]... 1p o two times a day as needed blood sugar > 200    Lantus Solostar 100 Unit/ml Soln (Insulin glargine) ..... Use asd 36 units subcutaneously once daily  Orders: TLB-BMP (Basic Metabolic Panel-BMET) (80048-METABOL) TLB-A1C / Hgb A1C (Glycohemoglobin) (83036-A1C) TLB-Lipid Panel (80061-LIPID)  I suspect some significant problem with compliance with insulin overall ;  based on his hx will increase the lantus to 36 units and cont to monitor CBG's closely, gave instructions on how to titrate up based on cbg's, and to call with any question, and to f/u 2 months or sooner if needed  Complete Medication List: 1)  Lisinopril 20 Mg Tabs (Lisinopril) .[name].. 1po once daily 2)  Fish Oil 1000 Mg Caps (Omega-3 fatty acids) .[name].. 1 by mouth qd 3)  Metformin Hcl 500 Mg Tb24 (Metformin hcl) .... 2 by mouth qam and 2 in the pm 4)  Daily Vitamins Tabs (Multiple vitamin) .[name].. 1 by mouth qd 5)  Onetouch Lancets Misc (Lancets) .... Check bld sugar qid 6)  Symbicort 160-4.5 Mcg/act Aero (Budesonide-formoterol fumarate) .... 2 puffs two times a day 7)  Freestyle Lite Test Strp (Glucose blood) .... Use asd 1 once daily 8)  Celexa 10 Mg Tabs (Citalopram hydrobromide) .[name].. 1 by mouth once daily 9)  Aspirin 81 Mg Tabs (Aspirin) .... Daily 10)  Ibuprofen 200 Mg Tabs (Ibuprofen) .... As needed 11)  Simvastatin 80 Mg Tabs (Simvastatin) .[name].. 1po once daily 12)  Amlodipine Besylate 5 Mg Tabs (Amlodipine besylate) .[name].. 1 by mouth once daily 13)  Flexeril 5 Mg Tabs (Cyclobenzaprine hcl) .[name].. 1 by mouth three times a day as needed 14)  Nexium 40 Mg Cpdr (Esomeprazole magnesium) .[name].. 1 capsule each day 30 minutes before meal 15)  Glimepiride 1 Mg Tabs (Glimepiride) .[name].. 1p o two times a day as needed blood sugar > 200 16)  Lantus Solostar 100 Unit/ml Soln (Insulin glargine) .... Use asd 36 units subcutaneously once  daily 17)  Lantus Solostar Needles  .... Use asd 1 once daily 18)  Levaquin 500 Mg Tabs (Levofloxacin) .[name].. 1po once daily  Patient Instructions: 1)  Please take all new medications as prescribed  - the antibiotic 2)  Continue all previous medications as before this visit , except increase the lantus to 36 units per day 3)  Please go to the Lab in the basement for your blood and/or urine tests today 4)  In the future, you can adjust the lantus upwards if your sugars are elevated (and you are not sick) by:   checking your blood sugars at least twice per day for 3 days  -    if the average is over 175 , you can incresae the lantus another 3 units (and then you can repeat the process over the next 3 days) 5)  Please schedule a follow-up appointment in 2 months wtih : 6)  BMP prior to visit, ICD-9: 250.02 7)  Lipid Panel prior to visit, ICD-9: 8)  HbgA1C prior to visit, ICD-9: Prescriptions: LEVAQUIN 500 MG TABS (LEVOFLOXACIN) 1po once daily  #10 x 0   Entered and Authorized by:   [name]   Signed by:   [name] on 11/13/2009   Method used:   Print then Give to Patient   RxID:   [id number]

## 2010-08-27 NOTE — Progress Notes (Signed)
Summary: Rx replacement  Phone Note Call from Patient Call back at Home Phone [phone] Message from:  Patient on August 05, 2010 4:37 PM  Caller: Patient Summary of Call: Pt called stating he lost all Rx he recieved from [facility], and request Rx to [facility]. Initial call taken by: Margaret Pyle, CMA,  August 05, 2010 4:39 PM    Prescriptions: DOXYCYCLINE HYCLATE 100 MG CAPS (DOXYCYCLINE HYCLATE) 1po two times a day  #20 x 0   Entered by:   Margaret Pyle, CMA   Authorized by:   [name]   Signed by:   Margaret Pyle, CMA on 08/05/2010   Method used:   Faxed to ...       [id number] (retail)       [address]       [place], [place]  [address]       Ph: [phone] or [phone]       Fax: [phone]   RxID:   [id number] FREESTYLE LITE TEST  STRP (GLUCOSE BLOOD) use asd 1 once daily  #100 x 11   Entered by:   Margaret Pyle, CMA   Authorized by:   [name]   Signed by:   Margaret Pyle, CMA on 08/05/2010   Method used:   Faxed to ...       [id number] (retail)       [address]       [place], [place]  [address]       Ph: [phone] or [phone]       Fax: [phone]   RxID:   [id number] [facility] LANCETS   MISC (LANCETS) check bld sugar qid  #100 x 3   Entered by:   Margaret Pyle, CMA   Authorized by:   [name]   Signed by:   Margaret Pyle, CMA on 08/05/2010   Method used:   Faxed to ...       [facility] (retail)       [address]       [place], [place]  [address]       Ph: [phone] or [phone]       Fax: [phone]   RxID:   [id number] HUMALOG KWIKPEN 100 UNIT/ML SOLN (INSULIN LISPRO (HUMAN)) use asd SSI  #5pens x 11   Entered by:   Margaret Pyle, CMA   Authorized by:   [name]   Signed by:   Margaret Pyle, CMA on 08/05/2010   Method used:   Faxed to ...       [facility] (retail)       [address]       [place], [place]  [address]       Ph: [phone] or [phone]       Fax: [phone]   RxID:   [id number] SERTRALINE HCL 100 MG TABS (SERTRALINE HCL) 1 by mouth once daily  #90 x 3   Entered by:   Margaret Pyle, CMA   Authorized by:   [name]   Signed by:   Margaret Pyle, CMA on 08/05/2010   Method used:  Faxed to ...       [facility] (retail)       [address]       [place], [place]  [address]       Ph: [phone] or [phone]       Fax: [phone]   RxID:   [id number] LIPITOR 10 MG TABS (ATORVASTATIN CALCIUM) 1po once daily  #90 x 3   Entered by:   Margaret Pyle, CMA   Authorized by:   [name]   Signed by:   Margaret Pyle, CMA on 08/05/2010   Method used:   Faxed to ...       [facility] (retail)       [address]       [place], [place]  [address]       Ph: [phone] or [phone]       Fax: [phone]   RxID:   [id number] NEXIUM 40 MG CPDR (ESOMEPRAZOLE MAGNESIUM) 1po once daily  #90 x 3   Entered by:   Margaret Pyle, CMA   Authorized by:   [name]   Signed by:   Margaret Pyle, CMA on 08/05/2010   Method used:   Faxed to ...       [facility] (retail)       [address]       [place], [place]  [address]       Ph: [phone] or [phone]       Fax: [phone]   RxID:   [id number] NYSTATIN 100000 UNIT/ML SUSP (NYSTATIN) 5 cc by mouth four time per day as needed  #1 bottle x 5   Entered by:   Margaret Pyle, CMA   Authorized by:   [name]   Signed by:   Margaret Pyle, CMA on 08/05/2010   Method used:   Faxed to ...       [facility] (retail)       [address]       [place], [place]  [address]       Ph: [phone] or [phone]       Fax: [phone]   RxID:   [id number] IBUPROFEN 800 MG TABS (IBUPROFEN) 1po q 6 hrs as needed  #60 x 3   Entered by:   Margaret Pyle, CMA   Authorized by:   [name]   Signed by:   Margaret Pyle, CMA on 08/05/2010   Method used:   Faxed to ...       [facility] (retail)       [address]       [place], [place]  [address]       Ph: [phone] or [phone]       Fax: [phone]   RxID:   [id number]

## 2010-08-27 NOTE — Assessment & Plan Note (Signed)
Summary: SUGAR PROBLEM---STC   Vital Signs:  Patient profile:   35 year old male Height:      73 inches Weight:      244 pounds BMI:     32.31 O2 Sat:      98 % on Room air Temp:     98.1 degrees F oral Pulse rate:   88 / minute BP sitting:   102 / 72  (left arm) Cuff size:   large  Vitals Entered ByZella Ball Ewing (August 22, 2009 3:24 PM)  O2 Flow:  Room air CC: BS problem/RE   Primary Care Provider:  [name]  CC:  BS problem/RE.  History of Present Illness: here to say he simply cannot afford januvia and actos, so only taking spradically and sugars often in the 200 and 300's when not taking;  denies orthostasis;  Pt denies CP, sob, doe, wheezing, orthopnea, pnd, worsening LE edema, palps, dizziness or syncope  Pt denies new neuro symptoms such as headache, facial or extremity weakness   Pt denies polydipsia, polyuria, or low sugar symptoms such as shakiness improved with eating.  Overall o/w  good compliance with meds, trying to follow low chol, DM diet, wt stable, little excercise however . Still has ritht LE in walking cast.   Problems Prior to Update: 1)  Back Pain  (ICD-724.5) 2)  Finger Pain  (ICD-729.5) 3)  Hypersomnia  (ICD-780.54) 4)  Heartburn  (ICD-787.1) 5)  Hemorrhage of Rectum and Anus  (ICD-569.3) 6)  Ibs  (ICD-564.1) 7)  Hematochezia  (ICD-578.1) 8)  Hepatotoxicity, Drug-induced, Risk of  (ICD-V58.69) 9)  Bunions, Right Foot  (ICD-727.1) 10)  Sciatica, Right  (ICD-724.3) 11)  Sinusitis- Acute-nos  (ICD-461.9) 12)  Contact With or Exposure To Venereal Diseases  (ICD-V01.6) 13)  Dysuria  (ICD-788.1) 14)  Family History of Cad Male 1st Degree Relative <60  (ICD-V16.49) 15)  Hypertension  (ICD-401.9) 16)  Fracture, Index Finger, Right  (ICD-816.00) 17)  Wrist Pain, Right  (ICD-719.43) 18)  Chest Pain  (ICD-786.50) 19)  Hand Pain, Right  (ICD-729.5) 20)  Substance Abuse, Multiple  (ICD-305.90) 21)  Preventive Health Care  (ICD-V70.0) 22)   Dyspnea/shortness of Breath  (ICD-786.09) 23)  Lung Nodule  (ICD-518.89) 24)  Shoulder Pain, Right  (ICD-719.41) 25)  Wrist Pain, Left  (ICD-719.43) 26)  Preventive Health Care  (ICD-V70.0) 27)  Hypogonadism, Male  (ICD-257.2) 55)  Hepatotoxicity, Drug-induced, Risk of  (ICD-V58.69) 29)  Erectile Dysfunction  (ICD-302.72) 30)  Family History Diabetes 1st Degree Relative  (ICD-V18.0) 31)  Renal Calculus, Hx of  (ICD-V13.01) 32)  Osteoarthritis  (ICD-715.90) 33)  Headache  (ICD-784.0) 34)  Hyperlipidemia  (ICD-272.4) 35)  Diabetes Mellitus, Type II  (ICD-250.00) 36)  Depression  (ICD-311)  Medications Prior to Update: 1)  Januvia 100 Mg  Tabs (Sitagliptin Phosphate) .[name].. 1 By Mouth Once Daily 2)  Actos 45 Mg  Tabs (Pioglitazone Hcl) .[name].. 1 By Mouth Once Daily 3)  Lisinopril 20 Mg Tabs (Lisinopril) .[name].. 1po Once Daily 4)  Fish Oil 1000 Mg  Caps (Omega-3 Fatty Acids) .[name].. 1 By Mouth Qd 5)  Metformin Hcl 500 Mg  Tb24 (Metformin Hcl) .... 2 By Mouth Qam and 2 in The Pm 6)  Daily Vitamins   Tabs (Multiple Vitamin) .[name].. 1 By Mouth Qd 7)  Onetouch Lancets   Misc (Lancets) .... Check Bld Sugar Qid 8)  Symbicort 160-4.5 Mcg/act Aero (Budesonide-Formoterol Fumarate) .... 2 Puffs Two Times A Day 9)  Freestyle  Lite Test  Strp (Glucose Blood) .... Use Asd 1 Once Daily 10)  Celexa 10 Mg Tabs (Citalopram Hydrobromide) .[name].. 1 By Mouth Once Daily 11)  Aspirin 81 Mg  Tabs (Aspirin) .... Daily 12)  Ibuprofen 200 Mg Tabs (Ibuprofen) .... As Needed 13)  Colchicine 0.6 Mg Tabs (Colchicine) .[name].. 1 By Mouth Once Daily 14)  Simvastatin 80 Mg Tabs (Simvastatin) .[name].. 1po Once Daily 15)  Amlodipine Besylate 5 Mg Tabs (Amlodipine Besylate) .[name].. 1 By Mouth Once Daily 16)  Hydrocodone-Homatropine 5-1.5 Mg/44ml Syrp (Hydrocodone-Homatropine) .[name].. 1 Tsp By Mouth Q 6 Hrs As Needed 17)  Flexeril 5 Mg Tabs (Cyclobenzaprine Hcl) .[name].. 1 By Mouth Three Times A Day As Needed 18)  Nexium 40 Mg  Cpdr (Esomeprazole Magnesium)  .[name].. 1 Capsule Each Day 30 Minutes Before Meal 19)  Glimepiride 1 Mg Tabs (Glimepiride) .[name].. 1p O Two Times A Day As Needed Blood Sugar > 200  Current Medications (verified): 1)  Lisinopril 20 Mg Tabs (Lisinopril) .[name].. 1po Once Daily 2)  Fish Oil 1000 Mg  Caps (Omega-3 Fatty Acids) .[name].. 1 By Mouth Qd 3)  Metformin Hcl 500 Mg  Tb24 (Metformin Hcl) .... 2 By Mouth Qam and 2 in The Pm 4)  Daily Vitamins   Tabs (Multiple Vitamin) .[name].. 1 By Mouth Qd 5)  Onetouch Lancets   Misc (Lancets) .... Check Bld Sugar Qid 6)  Symbicort 160-4.5 Mcg/act Aero (Budesonide-Formoterol Fumarate) .... 2 Puffs Two Times A Day 7)  Freestyle Lite Test  Strp (Glucose Blood) .... Use Asd 1 Once Daily 8)  Celexa 10 Mg Tabs (Citalopram Hydrobromide) .[name].. 1 By Mouth Once Daily 9)  Aspirin 81 Mg  Tabs (Aspirin) .... Daily 10)  Ibuprofen 200 Mg Tabs (Ibuprofen) .... As Needed 11)  Colchicine 0.6 Mg Tabs (Colchicine) .[name].. 1 By Mouth Once Daily 12)  Simvastatin 80 Mg Tabs (Simvastatin) .[name].. 1po Once Daily 13)  Amlodipine Besylate 5 Mg Tabs (Amlodipine Besylate) .[name].. 1 By Mouth Once Daily 14)  Hydrocodone-Homatropine 5-1.5 Mg/97ml Syrp (Hydrocodone-Homatropine) .[name].. 1 Tsp By Mouth Q 6 Hrs As Needed 15)  Flexeril 5 Mg Tabs (Cyclobenzaprine Hcl) .[name].. 1 By Mouth Three Times A Day As Needed 16)  Nexium 40 Mg  Cpdr (Esomeprazole Magnesium) .[name].. 1 Capsule Each Day 30 Minutes Before Meal 17)  Glimepiride 1 Mg Tabs (Glimepiride) .[name].. 1p O Two Times A Day As Needed Blood Sugar > 200 18)  Lantus Solostar 100 Unit/ml Soln (Insulin Glargine) .... Use Asd 15 Units Subcutaneously Once Daily 19)  Lantus Solostar Needles .... Use Asd 1 Once Daily  Allergies (verified): No Known Drug Allergies  Past History:  Past Medical History: Last updated: 06/10/2009 1. Depression 2. Diabetes mellitus, type II 3. Hyperlipidemia 4. Hypertension 5. Headaches 6. Osteoarthritis 7. Hx of Kidney Stones 8. polysubstance use - cocaine, marijuana (quit  summer, 2010) 9. Tobacco abuse 10. Asthma 11. Echo (6/10): EF 55-60% with mild LV hypertrophy, no regional wall motion abnormalities, normal diastolic function, normal RV, no pericardial effusion, normal PA systolic pressure.  12.  Chest pain: Possible episode of acute pericarditis in 6/10. ETT (7/10): 9', 10.1 METS, developed chest pain but no ST segment changes.  Negative study.  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  Started at age 62. 1 pack cigarettes/week.  Quit Dec 2009. 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   Head:  normocephalic and atraumatic.   Eyes:  vision grossly intact, pupils equal, and pupils round.   Ears:  R ear normal and L ear normal.   Nose:  no external deformity and no nasal discharge.   Mouth:  no gingival abnormalities and pharynx pink and moist.   Neck:  supple and no masses.   Lungs:  normal respiratory effort and normal breath sounds.   Heart:  normal rate and regular rhythm.   Abdomen:  soft, non-tender, and normal bowel sounds.   Extremities:  no edema, no erythema    Impression & Recommendations:  Problem # 1:  DIABETES MELLITUS, TYPE II (ICD-250.00)  The following medications were removed from the medication list:    Januvia 100 Mg Tabs (Sitagliptin phosphate) .[name]... 1 by mouth once daily    Actos 45 Mg Tabs (Pioglitazone hcl) .[name]... 1 by mouth once daily His updated medication list for this problem  includes:    Lisinopril 20 Mg Tabs (Lisinopril) .[name]... 1po once daily    Metformin Hcl 500 Mg Tb24 (Metformin hcl) .[name]... 2 by mouth qam and 2 in the pm    Aspirin 81 Mg Tabs (Aspirin) .[name]... Daily    Glimepiride 1 Mg Tabs (Glimepiride) .[name]... 1p o two times a day as needed blood sugar > 200    Lantus Solostar 100 Unit/ml Soln (Insulin glargine) ..... Use asd 15 units subcutaneously once daily to stop the actos/januvia; start lantus which overall should be less expensive;  instructed on how to give shots and use the pen and how to titrate the lantus to achieve best control  Problem # 2:  HYPERTENSION (ICD-401.9)  His updated medication list for this problem includes:    Lisinopril 20 Mg Tabs (Lisinopril) .[name]... 1po once daily    Amlodipine Besylate 5 Mg Tabs (Amlodipine besylate) .[name]... 1 by mouth once daily  BP today: 102/72 Prior BP: 130/82 (08/07/2009)  Prior 10 Yr Risk Heart Disease: 6 % (02/17/2009)  Labs Reviewed: K+: 3.7 (08/07/2009) Creat: : 1.1 (08/07/2009)   Chol: 160 (08/07/2009)   HDL: 30.60 (08/07/2009)   LDL: 94 (08/07/2009)   TG: 177.0 (08/07/2009) stable overall by hx and exam, ok to continue meds/tx as is   Problem # 3:  HYPERLIPIDEMIA (ICD-272.4)  His updated medication list for this problem includes:    Simvastatin 80 Mg Tabs (Simvastatin) .[name]... 1po once daily  Labs Reviewed: SGOT: 29 (06/02/2009)   SGPT: 30 (06/02/2009)  Prior 10 Yr Risk Heart Disease: 6 % (02/17/2009)   HDL:30.60 (08/07/2009), 32.20 (06/02/2009)  LDL:94 (08/07/2009), 84 (06/02/2009)  Chol:160 (08/07/2009), 125 (06/02/2009)  Trig:177.0 (08/07/2009), 45.0 (06/02/2009) d/w pt - stable overall by hx and exam, ok to continue meds/tx as is , to cont diet and meds as is;  goal ldl less than 70  Complete Medication List: 1)  Lisinopril 20 Mg Tabs (Lisinopril) .[name].. 1po once daily 2)  Fish Oil 1000 Mg Caps (Omega-3 fatty acids) .[name].. 1 by mouth qd 3)  Metformin Hcl 500 Mg Tb24 (Metformin hcl) .... 2 by  mouth qam and 2 in the pm 4)  Daily Vitamins Tabs (Multiple  vitamin) .[name].. 1 by mouth qd 5)  Onetouch Lancets Misc (Lancets) .... Check bld sugar qid 6)  Symbicort 160-4.5 Mcg/act Aero (Budesonide-formoterol fumarate) .... 2 puffs two times a day 7)  Freestyle Lite Test Strp (Glucose blood) .... Use asd 1 once daily 8)  Celexa 10 Mg Tabs (Citalopram hydrobromide) .[name].. 1 by mouth once daily 9)  Aspirin 81 Mg Tabs (Aspirin) .... Daily 10)  Ibuprofen 200 Mg Tabs (Ibuprofen) .... As needed 11)  Colchicine 0.6 Mg Tabs (Colchicine) .[name].. 1 by mouth once daily 12)  Simvastatin 80 Mg Tabs (Simvastatin) .[name].. 1po once daily 13)  Amlodipine Besylate 5 Mg Tabs (Amlodipine besylate) .[name].. 1 by mouth once daily 14)  Hydrocodone-homatropine 5-1.5 Mg/61ml Syrp (Hydrocodone-homatropine) .[name].. 1 tsp by mouth q 6 hrs as needed 15)  Flexeril 5 Mg Tabs (Cyclobenzaprine hcl) .[name].. 1 by mouth three times a day as needed 16)  Nexium 40 Mg Cpdr (Esomeprazole magnesium) .[name].. 1 capsule each day 30 minutes before meal 17)  Glimepiride 1 Mg Tabs (Glimepiride) .[name].. 1p o two times a day as needed blood sugar > 200 18)  Lantus Solostar 100 Unit/ml Soln (Insulin glargine) .... Use asd 15 units subcutaneously once daily 19)  Lantus Solostar Needles  .... Use asd 1 once daily  Patient Instructions: 1)  stop the [place] and actos  2)  start the lantus at 15 units per day 3)  check your sugars twice per day 4)  Please take the average of your blood sugars every 3 days;  if it is over 175, you can increase the lantus dosing by 3 units (such as 15 to 18 units) 5)  You can repeat this every 3 days until the the sugars start to be lower at some point of the day (usually in the AM) towards 100 to 140;  you should not increased the lantus anymore after the sugars start to be low, or the average sugar is less than 175 6)  You will be contacted about the referral(s) to: Diabetes clinic 7)  Continue all previous medications as before this  visit  8)  Please schedule a follow-up appointment in 2 weeks. Prescriptions: LANTUS SOLOSTAR NEEDLES use asd 1 once daily  #100 x 11   Entered and Authorized by:   [name]   Signed by:   [name] on 08/22/2009   Method used:   Print then Give to Patient   RxID:   [id number] LANTUS SOLOSTAR 100 UNIT/ML SOLN (INSULIN GLARGINE) use asd 15 units subcutaneously once daily  #1 box x 11   Entered and Authorized by:   [name]   Signed by:   [name] on 08/22/2009   Method used:   Print then Give to Patient   RxID:   [id number]

## 2010-08-27 NOTE — Assessment & Plan Note (Signed)
Summary: FLU SHOT/NWS  Nurse Visit   Vital Signs:  Patient profile:   35 year old male Temp:     97.8 degrees F oral  Vitals Entered By: Mervin Kung CMA [facility]) (April 02, 2010 2:33 PM)  Allergies: No Known Drug Allergies  Orders Added: 1)  Admin 1st Vaccine [90471] 2)  Flu Vaccine 21yrs + [21308]  CC: Pt here for flu shot.  Flu Vaccine Consent Questions     Do you have a history of severe allergic reactions to this vaccine? no    Any prior history of allergic reactions to egg and/or gelatin? no    Do you have a sensitivity to the preservative Thimersol? no    Do you have a past history of Guillan-Barre Syndrome? no    Do you currently have an acute febrile illness? no    Have you ever had a severe reaction to latex? no    Vaccine information given and explained to patient? yes    Are you currently pregnant? no    Lot Number:[id number]   Exp Date:01/23/2011   Site Given  Left Deltoid IM.  Nicki Guadalajara Fergerson CMA [facility])  April 02, 2010 2:35 PM

## 2010-08-27 NOTE — Progress Notes (Signed)
Summary: c pap setting -  Phone Note Call from Patient   Caller: Patient Call For: clance Summary of Call: cpap setting is to high. would like it changed.  Initial call taken by: Rickard Patience,  June 08, 2010 9:17 AM  Follow-up for Phone Call        Called pt's home number - [name]  Crystal Yetta Barre RN  June 08, 2010 10:19 AM  Pt feels like Cpap is blowing too much air.  Setting is at 10.  Pt iststill trying to wear machine a minimum of 4hrs/night.  Please advise. [name] RN  June 09, 2010 9:13 AM    Additional Follow-up for Phone Call Additional follow up Details #1::        let him know that if we go much lower, the pressure will not do him much good.  make sure he is using the ramp button set on up to .  If he is already doing this, then can decrease pressure to 8cm Additional Follow-up by: Barbaraann Share MD,  June 09, 2010 5:27 PM    Additional Follow-up for Phone Call Additional follow up Details #2::    spoke with pt and he states he had not been using the ramp button until last night and this helped alot, so nothing further needed at this time. Carron Curie CMA  June 10, 2010 8:49 AM

## 2010-08-27 NOTE — Assessment & Plan Note (Signed)
Summary: f/u /cd   Vital Signs:  Patient profile:   35 year old male Height:      73 inches Weight:      224 pounds BMI:     29.66 O2 Sat:      96 % on Room air Temp:     98.7 degrees F oral Pulse rate:   93 / minute BP sitting:   118 / 80  (left arm) Cuff size:   large  Vitals Entered ByMarland Kitchen Zella Ball Ewing (December 31, 2009 2:45 PM)  O2 Flow:  Room air CC: followup, discuss BS/RE   Primary Care Provider:  [name]  CC:  followup and discuss BS/RE.  History of Present Illness: overall doing ok, but with 2 to 3 wks blurred vision, general weakness somewhat, and increased thirst and urinary freq, assoc with blood sugars often in the 300's;  Pt denies CP, sob, doe, wheezing, orthopnea, pnd, worsening LE edema, palps, dizziness or syncope  Pt denies new neuro symptoms such as headache, facial or extremity weakness   No low sugars, trying to follow DM diet;  overall fair  complaince with meds (admits to occaisional lapse in rigorous self admin), but good tolerance.  No fever, wt loss, night sweats.    Problems Prior to Update: 1)  Frequency, Urinary  (ICD-788.41) 2)  Obstructive Sleep Apnea  (ICD-327.23) 3)  Back Pain  (ICD-724.5) 4)  Finger Pain  (ICD-729.5) 5)  Hypersomnia  (ICD-780.54) 6)  Heartburn  (ICD-787.1) 7)  Hemorrhage of Rectum and Anus  (ICD-569.3) 8)  Ibs  (ICD-564.1) 9)  Hematochezia  (ICD-578.1) 10)  Hepatotoxicity, Drug-induced, Risk of  (ICD-V58.69) 11)  Bunions, Right Foot  (ICD-727.1) 12)  Sciatica, Right  (ICD-724.3) 13)  Sinusitis- Acute-nos  (ICD-461.9) 14)  Contact With or Exposure To Venereal Diseases  (ICD-V01.6) 15)  Dysuria  (ICD-788.1) 16)  Family History of Cad Male 1st Degree Relative <60  (ICD-V16.49) 17)  Hypertension  (ICD-401.9) 18)  Fracture, Index Finger, Right  (ICD-816.00) 19)  Wrist Pain, Right  (ICD-719.43) 20)  Chest Pain  (ICD-786.50) 21)  Hand Pain, Right  (ICD-729.5) 22)  Substance Abuse, Multiple  (ICD-305.90) 23)  Preventive  Health Care  (ICD-V70.0) 24)  Dyspnea/shortness of Breath  (ICD-786.09) 25)  Lung Nodule  (ICD-518.89) 26)  Shoulder Pain, Right  (ICD-719.41) 27)  Wrist Pain, Left  (ICD-719.43) 28)  Preventive Health Care  (ICD-V70.0) 29)  Hypogonadism, Male  (ICD-257.2) 30)  Hepatotoxicity, Drug-induced, Risk of  (ICD-V58.69) 31)  Erectile Dysfunction  (ICD-302.72) 32)  Family History Diabetes 1st Degree Relative  (ICD-V18.0) 33)  Renal Calculus, Hx of  (ICD-V13.01) 34)  Osteoarthritis  (ICD-715.90) 35)  Headache  (ICD-784.0) 36)  Hyperlipidemia  (ICD-272.4) 37)  Diabetes Mellitus, Type II  (ICD-250.00) 38)  Depression  (ICD-311)  Medications Prior to Update: 1)  Lisinopril 20 Mg Tabs (Lisinopril) .[name].. 1po Once Daily 2)  Fish Oil 1000 Mg  Caps (Omega-3 Fatty Acids) .[name].. 1 By Mouth Qd 3)  Metformin Hcl 500 Mg  Tb24 (Metformin Hcl) .... 2 By Mouth Qam and 2 in The Pm 4)  Daily Vitamins   Tabs (Multiple Vitamin) .[name].. 1 By Mouth Qd 5)  Onetouch Lancets   Misc (Lancets) .... Check Bld Sugar Qid 6)  Symbicort 160-4.5 Mcg/act Aero (Budesonide-Formoterol Fumarate) .... 2 Puffs Two Times A Day 7)  Freestyle Lite Test  Strp (Glucose Blood) .... Use Asd 1 Once Daily 8)  Celexa 10 Mg Tabs (Citalopram Hydrobromide) .Marland KitchenMarland KitchenMarland Kitchen  1 By Mouth Once Daily 9)  Aspirin 81 Mg  Tabs (Aspirin) .... Daily 10)  Ibuprofen 200 Mg Tabs (Ibuprofen) .... As Needed 11)  Simvastatin 80 Mg Tabs (Simvastatin) .[name].. 1po Once Daily 12)  Amlodipine Besylate 5 Mg Tabs (Amlodipine Besylate) .[name].. 1 By Mouth Once Daily 13)  Flexeril 5 Mg Tabs (Cyclobenzaprine Hcl) .[name].. 1 By Mouth Three Times A Day As Needed 14)  Nexium 40 Mg  Cpdr (Esomeprazole Magnesium) .[name].. 1 Capsule Each Day 30 Minutes Before Meal 15)  Glimepiride 1 Mg Tabs (Glimepiride) .[name].. 1p O Two Times A Day As Needed Blood Sugar > 200 16)  Lantus Solostar 100 Unit/ml Soln (Insulin Glargine) .... Use Asd 36 Units Subcutaneously Once Daily 17)  Lantus Solostar Needles .... Use Asd 1 Once  Daily 18)  Azithromycin 250 Mg Tabs (Azithromycin) .... 2po Qd For 1 Day, Then 1po Qd For 4days, Then Stop  Current Medications (verified): 1)  Lisinopril 20 Mg Tabs (Lisinopril) .[name].. 1po Once Daily 2)  Fish Oil 1000 Mg  Caps (Omega-3 Fatty Acids) .[name].. 1 By Mouth Qd 3)  Daily Vitamins   Tabs (Multiple Vitamin) .[name].. 1 By Mouth Qd 4)  Onetouch Lancets   Misc (Lancets) .... Check Bld Sugar Qid 5)  Symbicort 160-4.5 Mcg/act Aero (Budesonide-Formoterol Fumarate) .... 2 Puffs Two Times A Day 6)  Freestyle Lite Test  Strp (Glucose Blood) .... Use Asd 1 Once Daily 7)  Celexa 10 Mg Tabs (Citalopram Hydrobromide) .[name].. 1 By Mouth Once Daily 8)  Aspirin 81 Mg  Tabs (Aspirin) .... Daily 9)  Ibuprofen 200 Mg Tabs (Ibuprofen) .... As Needed 10)  Simvastatin 80 Mg Tabs (Simvastatin) .[name].. 1po Once Daily 11)  Amlodipine Besylate 5 Mg Tabs (Amlodipine Besylate) .[name].. 1 By Mouth Once Daily 12)  Flexeril 5 Mg Tabs (Cyclobenzaprine Hcl) .[name].. 1 By Mouth Three Times A Day As Needed 13)  Nexium 40 Mg  Cpdr (Esomeprazole Magnesium) .[name].. 1 Capsule Each Day 30 Minutes Before Meal 14)  Glimepiride 1 Mg Tabs (Glimepiride) .[name].. 1p O Two Times A Day As Needed Blood Sugar > 200 15)  Lantus Solostar 100 Unit/ml Soln (Insulin Glargine) .... Use Asd 40 Units Subcutaneously Once Daily 16)  Lantus Solostar Needles .... Use Asd 1 Once Daily 17)  Humalog Kwikpen 100 Unit/ml Soln (Insulin Lispro (Human)) .... Use Asd Ssi 18)  Humalog Kwikpen Needle .... Use Asd Three Times A Day  Allergies (verified): No Known Drug Allergies  Past History:  Past Medical History: Last updated: 06/10/2009 1. Depression 2. Diabetes mellitus, type II 3. Hyperlipidemia 4. Hypertension 5. Headaches 6. Osteoarthritis 7. Hx of Kidney Stones 8. polysubstance use - cocaine, marijuana (quit summer, 2010) 9. Tobacco abuse 10. Asthma 11. Echo (6/10): EF 55-60% with mild LV hypertrophy, no regional wall motion abnormalities, normal diastolic  function, normal RV, no pericardial effusion, normal PA systolic pressure.  12.  Chest pain: Possible episode of acute pericarditis in 6/10. ETT (7/10): 9', 10.1 METS, developed chest pain but no ST segment changes.  Negative study.  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  Started at age 25. 1 pack cigarettes/week.  Quit Dec 2009. 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   Head:  normocephalic and atraumatic.   Eyes:  vision grossly intact, pupils equal, and pupils round.   Ears:  R ear normal and L ear normal.   Nose:  no external deformity and no nasal discharge.   Mouth:  no gingival abnormalities and pharynx pink and moist.   Neck:  supple and no masses.   Lungs:  normal respiratory effort and normal breath sounds.   Heart:  normal rate and regular rhythm.   Abdomen:  soft, non-tender, and normal bowel sounds.   Msk:  no joint tenderness and no joint swelling.   Extremities:  no edema, no erythema    Impression & Recommendations:  Problem # 1:  DIABETES MELLITUS, TYPE II (ICD-250.00)  The following medications were removed from the medication list:    Metformin Hcl 500 Mg Tb24 (Metformin hcl) .[name]... 2 by mouth qam and 2 in the pm His updated medication list for this problem includes:    Lisinopril 20 Mg Tabs (Lisinopril) .[name]... 1po once daily    Aspirin 81 Mg Tabs (Aspirin) .[name]... Daily    Glimepiride 1 Mg Tabs (Glimepiride) .[name]... 1p o two  times a day as needed blood sugar > 200    Lantus Solostar 100 Unit/ml Soln (Insulin glargine) ..... Use asd 40 units subcutaneously once daily    Humalog Kwikpen 100 Unit/ml Soln (Insulin lispro (human)) ..... Use asd ssi uncontrolled without apparent exacerbating factors, to d/c the metformin, increase the lantus to 40 units;  add the humalog kwikpen SSI asd  Problem # 2:  HYPERTENSION (ICD-401.9)  His updated medication list for this problem includes:    Lisinopril 20 Mg Tabs (Lisinopril) .[name]... 1po once daily    Amlodipine Besylate 5 Mg Tabs (Amlodipine besylate) .[name]... 1 by mouth once daily  BP today: 118/80 Prior BP: 112/82 (11/13/2009)  Prior 10 Yr Risk Heart Disease: 6 % (02/17/2009)  Labs Reviewed: K+: 4.7 (11/13/2009) Creat: : 1.3 (11/13/2009)   Chol: 194 (11/13/2009)   HDL: 40.50 (11/13/2009)   LDL: 94 (08/07/2009)   TG: 325.0 (11/13/2009) stable overall by hx and exam, ok to continue meds/tx as is   Problem # 3:  HYPERLIPIDEMIA (ICD-272.4)  His updated medication list for this problem includes:    Simvastatin 80 Mg Tabs (Simvastatin) .[name]... 1po once daily  Labs Reviewed: SGOT: 29 (06/02/2009)   SGPT: 30 (06/02/2009)  Prior 10 Yr Risk Heart Disease: 6 % (02/17/2009)   HDL:40.50 (11/13/2009), 30.60 (08/07/2009)  LDL:94 (08/07/2009), 84 (06/02/2009)  Chol:194 (11/13/2009), 160 (08/07/2009)  Trig:325.0 (11/13/2009), 177.0 (08/07/2009) stable overall by hx and exam, ok to continue meds/tx as is   Complete Medication List: 1)  Lisinopril 20 Mg Tabs (Lisinopril) .[name].. 1po once daily 2)  Fish Oil 1000 Mg Caps (Omega-3 fatty acids) .[name].. 1 by mouth qd 3)  Daily Vitamins Tabs (Multiple vitamin) .[name].. 1 by mouth qd 4)  Onetouch Lancets Misc (Lancets) .... Check bld sugar qid 5)  Symbicort 160-4.5 Mcg/act Aero (Budesonide-formoterol fumarate) .... 2 puffs two times a day 6)  Freestyle Lite Test Strp (Glucose blood) .... Use asd 1 once daily 7)  Celexa 10 Mg Tabs (Citalopram  hydrobromide) .[name].. 1 by mouth once daily 8)  Aspirin 81 Mg Tabs (Aspirin) .... Daily 9)  Ibuprofen 200 Mg Tabs (  Ibuprofen) .... As needed 10)  Simvastatin 80 Mg Tabs (Simvastatin) .[name].. 1po once daily 11)  Amlodipine Besylate 5 Mg Tabs (Amlodipine besylate) .[name].. 1 by mouth once daily 12)  Flexeril 5 Mg Tabs (Cyclobenzaprine hcl) .[name].. 1 by mouth three times a day as needed 13)  Nexium 40 Mg Cpdr (Esomeprazole magnesium) .[name].. 1 capsule each day 30 minutes before meal 14)  Glimepiride 1 Mg Tabs (Glimepiride) .[name].. 1p o two times a day as needed blood sugar > 200 15)  Lantus Solostar 100 Unit/ml Soln (Insulin glargine) .... Use asd 40 units subcutaneously once daily 16)  Lantus Solostar Needles  .... Use asd 1 once daily 17)  Humalog Kwikpen 100 Unit/ml Soln (Insulin lispro (human)) .... Use asd ssi 18)  Humalog Kwikpen Needle  .... Use asd three times a day  Patient Instructions: 1)  stop the metformin 2)  increase the lantus to 40 units per day 3)  use the Humalog KwikPen three times per day before meals: 4)  Less than 150 -   None 5)  151 - 200      2 units 6)  201 - 250     4 units 7)  251 - 300     6 units 8)  301 - 350    8 units 9)  351 - 450    10 units 10)  More than 450  -     12 units 11)  Continue all previous medications as before this visit  12)  Please schedule a follow-up appointment in 6 wks with: 13)  BMP prior to visit, ICD-9: 250.02 14)  Lipid Panel prior to visit, ICD-9: 15)  HbgA1C prior to visit, ICD-9: Prescriptions: HUMALOG KWIKPEN NEEDLE use asd three times a day  #300 x 11   Entered and Authorized by:   [name]   Signed by:   [name] on 12/31/2009   Method used:   Print then Give to Patient   RxID:   [id number] HUMALOG KWIKPEN 100 UNIT/ML SOLN (INSULIN LISPRO (HUMAN)) use asd SSI  #1 box x 11   Entered and Authorized by:   [name]   Signed by:   [name] on 12/31/2009   Method used:   Print then Give to Patient   RxID:    [id number]

## 2010-08-27 NOTE — Letter (Signed)
Summary: [facility]  [facility]   Imported By: Lester [place] 03/11/2010 10:22:33  _____________________________________________________________________  External Attachment:    Type:   Image     Comment:   External Document

## 2010-08-27 NOTE — Assessment & Plan Note (Signed)
Summary: headaches-lb   Vital Signs:  Patient profile:   35 year old male Height:      74 inches Weight:      201.75 pounds BMI:     26.00 O2 Sat:      97 % on Room air Temp:     98.6 degrees F oral Pulse rate:   112 / minute BP sitting:   148 / 104  (left arm) Cuff size:   large  Vitals Entered By: Zella Ball Ewing CMA Duncan Dull) (July 22, 2010 1:57 PM)  O2 Flow:  Room air CC: Headaches, dizziness/RE   Primary Care Provider:  [name]  CC:  Headaches and dizziness/RE.  History of Present Illness: here wtih acute onset mild to mod 3 days headache, fever, facial pain, pressure, greenish d/c and slight ST, without cough and Pt denies CP, worsening sob, doe, wheezing, orthopnea, pnd, worsening LE edema, palps, dizziness or syncope.  Pt denies new neuro symptoms such as headache, facial or extremity weakness  Pt denies polydipsia, polyuria   Overall good compliance with meds, trying to follow low chol diet, wt stable, little excercise however   CBG's in the low 100's for the most part since recent d/c.  Has lantus per pt assist program, still waiting for the humalog to be approved.    Problems Prior to Update: 1)  Nausea  (ICD-787.02) 2)  Sinusitis- Acute-nos  (ICD-461.9) 3)  Gerd  (ICD-530.81) 4)  Uri  (ICD-465.9) 5)  Toe Pain  (ICD-729.5) 6)  Thrush  (ICD-112.0) 7)  Frequency, Urinary  (ICD-788.41) 8)  Obstructive Sleep Apnea  (ICD-327.23) 9)  Back Pain  (ICD-724.5) 10)  Finger Pain  (ICD-729.5) 11)  Hypersomnia  (ICD-780.54) 12)  Heartburn  (ICD-787.1) 13)  Hemorrhage of Rectum and Anus  (ICD-569.3) 14)  Ibs  (ICD-564.1) 15)  Hematochezia  (ICD-578.1) 16)  Hepatotoxicity, Drug-induced, Risk of  (ICD-V58.69) 17)  Bunions, Right Foot  (ICD-727.1) 18)  Sciatica, Right  (ICD-724.3) 19)  Sinusitis- Acute-nos  (ICD-461.9) 20)  Contact With or Exposure To Venereal Diseases  (ICD-V01.6) 21)  Dysuria  (ICD-788.1) 22)  Family History of Cad Male 1st Degree Relative <60   (ICD-V16.49) 23)  Hypertension  (ICD-401.9) 24)  Fracture, Index Finger, Right  (ICD-816.00) 25)  Wrist Pain, Right  (ICD-719.43) 26)  Chest Pain  (ICD-786.50) 27)  Hand Pain, Right  (ICD-729.5) 28)  Substance Abuse, Multiple  (ICD-305.90) 29)  Preventive Health Care  (ICD-V70.0) 30)  Dyspnea/shortness of Breath  (ICD-786.09) 31)  Lung Nodule  (ICD-518.89) 32)  Shoulder Pain, Right  (ICD-719.41) 33)  Wrist Pain, Left  (ICD-719.43) 34)  Preventive Health Care  (ICD-V70.0) 35)  Hypogonadism, 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 Check Bld Sugar Qid 2)  Freestyle Lite Test  Strp (Glucose Blood) .... Use Asd 1 Once Daily 3)  Glimepiride 1 Mg Tabs (Glimepiride) .[name].. 1po Two Times A Day As Needed Blood Sugar > 200 4)  Lantus Solostar 100 Unit/ml Soln (Insulin Glargine) .... Use Asd 40 Units Subcutaneously Once Daily 5)  Humalog Kwikpen 100 Unit/ml Soln (Insulin  Lispro (Human)) .... Use Asd Ssi 6)  Sertraline Hcl 100 Mg Tabs (Sertraline Hcl) .[name].. 1 By Mouth Once Daily 7)  Lipitor 10 Mg Tabs (Atorvastatin Calcium) .[name].. 1po Once Daily 8)  Nexium 40 Mg Cpdr (Esomeprazole Magnesium) .[name].. 1po Once Daily 9)  Nystatin 100000 Unit/ml Susp (Nystatin) .... 5 Cc By Mouth Four Time Per Day As Needed 10)  Ibuprofen 800 Mg Tabs (Ibuprofen) .[name].. 1po Q 6 Hrs As Needed 11)  Glipizide 10 Mg Tabs (Glipizide) .[name].. 1 By Mouth Once Daily Before Breadfast 12)  Lantus 100 Unit/ml Soln (Insulin Glargine) .... 45 Units Once Daily At At Bedtime 13)  Lisinopril 10 Mg Tabs (Lisinopril) .[name].. 1 By Mouth  Once Daily 14)  Metformin Hcl 500 Mg Tabs (Metformin Hcl) .[name].. 1 By Mouth Two Times A Day With Meals 15)  Aspir-Low 81 Mg Tbec (Aspirin) .[name].. 1 By Mouth Once Daily 16)  Fish Oil 1000 Mg Caps (Omega-3 Fatty Acids) .[name].. 1 By Mouth Once Daily 17)  Glucosamine 500 Mg Caps (Glucosamine Sulfate) .[name].. 1 By Mouth Once Daily 18)  Multivitamins  Caps (Multiple Vitamin) .[name].. 1 By Mouth Once Daily 19)  B-100  Tabs (Vitamins-Lipotropics) .[name].. 1 By Mouth Once Daily 20)  Vitamin D 1000 Unit Tabs (Cholecalciferol) .[name].. 1 By Mouth Once Daily  Current Medications (verified): 1)  Onetouch Lancets   Misc (Lancets) .... Check Bld Sugar Qid 2)  Freestyle Lite Test  Strp (Glucose Blood) .... Use Asd 1 Once Daily 3)  Glipizide 10 Mg Xr24h-Tab (Glipizide) .[name].. 1 By Mouth Once Daily 4)  Lantus Solostar 100 Unit/ml Soln (Insulin Glargine) .... Use Asd 45 Units Subcutaneously Once Daily 5)  Humalog Kwikpen 100 Unit/ml Soln (Insulin Lispro (Human)) .... Use Asd Ssi 6)  Sertraline Hcl 100 Mg Tabs (Sertraline Hcl) .[name].. 1 By Mouth Once Daily 7)  Lipitor 10 Mg Tabs (Atorvastatin Calcium) .[name].. 1po Once Daily 8)  Nexium 40 Mg Cpdr (Esomeprazole Magnesium) .[name].. 1po Once Daily 9)  Nystatin 100000 Unit/ml Susp (Nystatin) .... 5 Cc By Mouth Four Time Per Day As Needed 10)  Ibuprofen 800 Mg Tabs (Ibuprofen) .[name].. 1po Q 6 Hrs As Needed 11)  Lisinopril 10 Mg Tabs (Lisinopril) .[name].. 1 By Mouth Once Daily 12)  Metformin Hcl 500 Mg Tabs (Metformin Hcl) .[name].. 1 By Mouth Two Times A Day With Meals 13)  Aspir-Low 81 Mg Tbec (Aspirin) .[name].. 1 By Mouth Once Daily 14)  Fish Oil 1000 Mg Caps (Omega-3 Fatty Acids) .[name].. 1 By Mouth Once Daily 15)  Glucosamine 500 Mg Caps (Glucosamine Sulfate) .[name].. 1 By Mouth Once Daily 16)  Multivitamins  Caps (Multiple Vitamin) .[name].. 1 By Mouth Once Daily 17)  B-100  Tabs (Vitamins-Lipotropics) .[name].. 1 By Mouth Once Daily 18)  Vitamin D 1000 Unit Tabs (Cholecalciferol) .[name].. 1 By Mouth Once Daily 19)  Doxycycline  Hyclate 100 Mg Caps (Doxycycline Hyclate) .[name].. 1po Two Times A Day  Allergies (verified): No Known Drug Allergies  Past History:  Past Surgical History: Last updated: 08/07/2009 L Knee Surgery- 1995  Bunionectomy (Right big toe and pinky toe)--06/2009  Social History: Last updated: 07/10/2010 Former smoker.  Started at age 47. 1 pack cigarettes/week.  Quit Dec 2009. 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 Depression 2.  Diabetes mellitus, type II 3. Hyperlipidemia 4. Hypertension 5. Headaches 6. Osteoarthritis 7. Hx of Kidney Stones 8. polysubstance use - cocaine, marijuana (quit summer, 2010) 9. Tobacco abuse 10. Asthma 11. Echo (6/10): EF 55-60% with mild LV hypertrophy, no regional wall motion abnormalities, normal diastolic function, normal RV, no pericardial effusion, normal PA systolic pressure.  12.  Chest pain: Possible episode of acute pericarditis in 6/10. ETT (7/10): 9', 10.1 METS, developed chest pain but no ST segment changes.  Negative study.  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  , mild ill  Head:  normocephalic and atraumatic.   Eyes:  vision grossly intact, pupils equal, and pupils round.   Ears:  bilat tm's red, sinus tender bilat Nose:  nasal dischargemucosal pallor and mucosal edema.   Mouth:  pharyngeal erythema and fair dentition.   Neck:  supple and cervical lymphadenopathy.   Lungs:  normal respiratory effort and normal breath sounds.   Heart:  normal rate and regular rhythm.   Abdomen:  soft, non-tender, and normal bowel sounds.   Extremities:   no edema, no erythema    Impression & Recommendations:  Problem # 1:  SINUSITIS- ACUTE-NOS (ICD-461.9)  His updated medication list for this problem includes:    Doxycycline Hyclate 100 Mg Caps (Doxycycline hyclate) .[name]... 1po two times a day treat as above, f/u any worsening signs or symptoms   Problem # 2:  HYPERTENSION (ICD-401.9)  His updated medication list for this problem includes:    Lisinopril 10 Mg Tabs (Lisinopril) .[name]... 1 by mouth once daily  BP today: 148/104 Prior BP: 110/70 (07/10/2010)  Prior 10 Yr Risk Heart Disease: 6 % (02/17/2009)  Labs Reviewed: K+: 4.5 (06/05/2010) Creat: : 1.2 (06/05/2010)   Chol: 231 (06/05/2010)   HDL: 42.80 (06/05/2010)   LDL: 94 (08/07/2009)   TG: 257.0 (06/05/2010) mild elev today, likely situational, ok to follow, continue same treatment   Problem # 3:  DIABETES MELLITUS, TYPE II (ICD-250.00)  The following medications were removed from the medication list:    Glipizide 10 Mg Tabs (Glipizide) .[name]... 1 by mouth once daily before breadfast    Lantus 100 Unit/ml Soln (Insulin glargine) .Marland KitchenMarland KitchenMarland KitchenMarland Kitchen 45 units once daily at at bedtime His updated medication list for this problem includes:    Glipizide 10 Mg Xr24h-tab (Glipizide) .[name]... 1 by mouth once daily    Lantus Solostar 100 Unit/ml Soln (Insulin glargine) ..... Use asd 45 units subcutaneously once daily    Humalog Kwikpen 100 Unit/ml Soln (Insulin lispro (human)) ..... Use asd ssi    Lisinopril 10 Mg Tabs (Lisinopril) .[name]... 1 by mouth once daily    Metformin Hcl 500 Mg Tabs (Metformin hcl) .[name]... 1 by mouth two times a day with meals    Aspir-low 81 Mg Tbec (Aspirin) .[name]... 1 by mouth once daily  Labs Reviewed: Creat: 1.2 (06/05/2010)    Reviewed HgBA1c results: 14.3 (06/05/2010)  12.8 (11/13/2009) with recent hospn, now states good compliacne with meds, Pt to cont DM diet, excercise, wt control efforts; to check labs next visit  Complete Medication List: 1)  Onetouch Lancets Misc  (Lancets) .... Check bld sugar qid 2)  Freestyle Lite Test Strp (Glucose blood) .... Use asd 1 once daily 3)  Glipizide 10 Mg Xr24h-tab (Glipizide) .[name].. 1 by mouth once daily 4)  Lantus  Solostar 100 Unit/ml Soln (Insulin glargine) .... Use asd 45 units subcutaneously once daily 5)  Humalog Kwikpen 100 Unit/ml Soln (Insulin lispro (human)) .... Use asd ssi 6)  Sertraline Hcl 100 Mg Tabs (Sertraline hcl) .[name].. 1 by mouth once daily 7)  Lipitor 10 Mg Tabs (Atorvastatin calcium) .[name].. 1po once daily 8)  Nexium 40 Mg Cpdr (Esomeprazole magnesium) .[name].. 1po once daily 9)  Nystatin 100000 Unit/ml Susp (Nystatin) .... 5 cc by mouth four time per day as needed 10)  Ibuprofen 800 Mg Tabs (Ibuprofen) .[name].. 1po q 6 hrs as needed 11)  Lisinopril 10 Mg Tabs (Lisinopril) .[name].. 1 by mouth once daily 12)  Metformin Hcl 500 Mg Tabs (Metformin hcl) .[name].. 1 by mouth two times a day with meals 13)  Aspir-low 81 Mg Tbec (Aspirin) .[name].. 1 by mouth once daily 14)  Fish Oil 1000 Mg Caps (Omega-3 fatty acids) .[name].. 1 by mouth once daily 15)  Glucosamine 500 Mg Caps (Glucosamine sulfate) .[name].. 1 by mouth once daily 16)  Multivitamins Caps (Multiple vitamin) .[name].. 1 by mouth once daily 17)  B-100 Tabs (Vitamins-lipotropics) .[name].. 1 by mouth once daily 18)  Vitamin D 1000 Unit Tabs (Cholecalciferol) .[name].. 1 by mouth once daily 19)  Doxycycline Hyclate 100 Mg Caps (Doxycycline hyclate) .[name].. 1po two times a day  Patient Instructions: 1)  Please take all new medications as prescribed 2)  Continue all previous medications as before this visit  3)  Please schedule a follow-up appointment in 2 months with: 4)  BMP prior to visit, ICD-9: 250.02 5)  Lipid Panel prior to visit, ICD-9: 6)  HbgA1C prior to visit, ICD-9: Prescriptions: DOXYCYCLINE HYCLATE 100 MG CAPS (DOXYCYCLINE HYCLATE) 1po two times a day  #20 x 0   Entered and Authorized by:   [name]   Signed by:   [name] on 07/22/2010   Method used:   Print then  Give to Patient   RxID:   [id number]    Orders Added: 1)  Est. Patient Level IV [14782]

## 2010-08-27 NOTE — Assessment & Plan Note (Signed)
Summary: FU/PN   Vital Signs:  Patient profile:   35 year old male Height:      74 inches (187.96 cm) Weight:      237 pounds (107.73 kg) BMI:     30.54 O2 Sat:      97 % on Room air Temp:     98.3 degrees F (36.83 degrees C) oral Pulse rate:   92 / minute BP sitting:   112 / 80  (left arm) Cuff size:   large  Vitals Entered ByZella Ball Ewing (September 22, 2009 11:25 AM)  O2 Flow:  Room air CC: followup/RE   Primary Care Provider:  [name]  CC:  followup/RE.  History of Present Illness: ran out of levemir last thur;  lost rx for the lantus and trying to take the [place] and actos to get by;  came to sat clinic and asked to come in today;  will take sample if available Pt denies polydipsia, polyuria, or low sugar symptoms such as shakiness improved with eating.  Overall good compliance with meds, trying to follow low chol, DM diet, wt stable, little excercise however Has had several cbg's at 400 in the past 2 days.  Pt denies CP, sob, doe, wheezing, orthopnea, pnd, worsening LE edema, palps, dizziness or syncope   Pt denies new neuro symptoms such as headache, facial or extremity weakness   No n/v or other abd pain to suggest acidosis.  No fever or other pain today.  Overall usually good complaicne with meds.  Also need refill on the flexeril as needed   Problems Prior to Update: 1)  Back Pain  (ICD-724.5) 2)  Finger Pain  (ICD-729.5) 3)  Hypersomnia  (ICD-780.54) 4)  Heartburn  (ICD-787.1) 5)  Hemorrhage of Rectum and Anus  (ICD-569.3) 6)  Ibs  (ICD-564.1) 7)  Hematochezia  (ICD-578.1) 8)  Hepatotoxicity, Drug-induced, Risk of  (ICD-V58.69) 9)  Bunions, Right Foot  (ICD-727.1) 10)  Sciatica, Right  (ICD-724.3) 11)  Sinusitis- Acute-nos  (ICD-461.9) 12)  Contact With or Exposure To Venereal Diseases  (ICD-V01.6) 13)  Dysuria  (ICD-788.1) 14)  Family History of Cad Male 1st Degree Relative <60  (ICD-V16.49) 15)  Hypertension  (ICD-401.9) 16)  Fracture, Index Finger,  Right  (ICD-816.00) 17)  Wrist Pain, Right  (ICD-719.43) 18)  Chest Pain  (ICD-786.50) 19)  Hand Pain, Right  (ICD-729.5) 20)  Substance Abuse, Multiple  (ICD-305.90) 21)  Preventive Health Care  (ICD-V70.0) 22)  Dyspnea/shortness of Breath  (ICD-786.09) 23)  Lung Nodule  (ICD-518.89) 24)  Shoulder Pain, Right  (ICD-719.41) 25)  Wrist Pain, Left  (ICD-719.43) 26)  Preventive Health Care  (ICD-V70.0) 27)  Hypogonadism, Male  (ICD-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 1po Once Daily 2)  Fish Oil 1000 Mg  Caps (Omega-3 Fatty Acids) .[name].. 1 By Mouth Qd 3)  Metformin Hcl 500 Mg  Tb24 (Metformin Hcl) .... 2 By Mouth Qam and 2 in The Pm 4)  Daily Vitamins   Tabs (Multiple Vitamin) .[name].. 1 By Mouth Qd 5)  Onetouch Lancets   Misc (Lancets) .... Check Bld  Sugar Qid 6)  Symbicort 160-4.5 Mcg/act Aero (Budesonide-Formoterol Fumarate) .... 2 Puffs Two Times A Day 7)  Freestyle Lite Test  Strp (Glucose Blood) .... Use Asd 1 Once Daily 8)  Celexa 10 Mg Tabs (Citalopram Hydrobromide) .[name].. 1 By Mouth Once Daily 9)  Aspirin 81 Mg  Tabs (Aspirin) .... Daily 10)  Ibuprofen 200 Mg Tabs (Ibuprofen) .... As Needed 11)  Colchicine 0.6 Mg Tabs (Colchicine) .[name].. 1 By Mouth Once Daily 12)  Simvastatin 80 Mg Tabs (Simvastatin) .[name].. 1po Once Daily 13)  Amlodipine Besylate 5 Mg Tabs (Amlodipine Besylate) .[name].. 1 By Mouth Once Daily 14)  Hydrocodone-Homatropine 5-1.5 Mg/16ml Syrp (Hydrocodone-Homatropine) .[name].. 1 Tsp By Mouth Q 6 Hrs As Needed 15)  Flexeril 5 Mg Tabs (Cyclobenzaprine Hcl) .[name].. 1 By Mouth Three  Times A Day As Needed 16)  Nexium 40 Mg  Cpdr (Esomeprazole Magnesium) .[name].. 1 Capsule Each Day 30 Minutes Before Meal 17)  Glimepiride 1 Mg Tabs (Glimepiride) .[name].. 1p O Two Times A Day As Needed Blood Sugar > 200 18)  Lantus Solostar 100 Unit/ml Soln (Insulin Glargine) .... Use Asd 15 Units Subcutaneously Once Daily 19)  Lantus Solostar Needles .... Use Asd 1 Once Daily  Current Medications (verified): 1)  Lisinopril 20 Mg Tabs (Lisinopril) .[name].. 1po Once Daily 2)  Fish Oil 1000 Mg  Caps (Omega-3 Fatty Acids) .[name].. 1 By Mouth Qd 3)  Metformin Hcl 500 Mg  Tb24 (Metformin Hcl) .... 2 By Mouth Qam and 2 in The Pm 4)  Daily Vitamins   Tabs (Multiple Vitamin) .[name].. 1 By Mouth Qd 5)  Onetouch Lancets   Misc (Lancets) .... Check Bld Sugar Qid 6)  Symbicort 160-4.5 Mcg/act Aero (Budesonide-Formoterol Fumarate) .... 2 Puffs Two Times A Day 7)  Freestyle Lite Test  Strp (Glucose Blood) .... Use Asd 1 Once Daily 8)  Celexa 10 Mg Tabs (Citalopram Hydrobromide) .[name].. 1 By Mouth Once Daily 9)  Aspirin 81 Mg  Tabs (Aspirin) .... Daily 10)  Ibuprofen 200 Mg Tabs (Ibuprofen) .... As Needed 11)  Simvastatin 80 Mg Tabs (Simvastatin) .[name].. 1po Once Daily 12)  Amlodipine Besylate 5 Mg Tabs (Amlodipine Besylate) .[name].. 1 By Mouth Once Daily 13)  Hydrocodone-Homatropine 5-1.5 Mg/77ml Syrp (Hydrocodone-Homatropine) .[name].. 1 Tsp By Mouth Q 6 Hrs As Needed 14)  Flexeril 5 Mg Tabs (Cyclobenzaprine Hcl) .[name].. 1 By Mouth Three Times A Day As Needed 15)  Nexium 40 Mg  Cpdr (Esomeprazole Magnesium) .[name].. 1 Capsule Each Day 30 Minutes Before Meal 16)  Glimepiride 1 Mg Tabs (Glimepiride) .[name].. 1p O Two Times A Day As Needed Blood Sugar > 200 17)  Lantus Solostar 100 Unit/ml Soln (Insulin Glargine) .... Use Asd 15 Units Subcutaneously Once Daily 18)  Lantus Solostar Needles .... Use Asd 1 Once Daily  Allergies (verified): No Known Drug Allergies  Past History:  Past Medical History: Last updated: 06/10/2009 1. Depression 2.  Diabetes mellitus, type II 3. Hyperlipidemia 4. Hypertension 5. Headaches 6. Osteoarthritis 7. Hx of Kidney Stones 8. polysubstance use - cocaine, marijuana (quit summer, 2010) 9. Tobacco abuse 10. Asthma 11. Echo (6/10): EF 55-60% with mild LV hypertrophy, no regional wall motion abnormalities, normal diastolic function, normal RV, no pericardial effusion, normal PA systolic pressure.  12.  Chest pain: Possible episode of acute pericarditis in 6/10. ETT (7/10): 9', 10.1 METS, developed chest pain but no ST segment changes.  Negative study.  Lumbar Disc disease with sciatica right bunion   Past Surgical History: Last updated: 08/07/2009 L Knee  Surgery- 1995  Bunionectomy (Right big toe and pinky toe)--06/2009  Social History: Last updated: 07/16/2009 Former smoker.  Started at age 47. 1 pack cigarettes/week.  Quit Dec 2009. 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   Head:  normocephalic and atraumatic.   Eyes:  vision grossly intact, pupils equal, and pupils round.   Ears:  R ear normal and L ear normal.   Nose:  no external deformity and no nasal discharge.   Mouth:  no gingival abnormalities and pharynx pink and moist.   Neck:  supple and no masses.   Lungs:  normal respiratory effort and normal breath sounds.   Heart:  normal rate and regular rhythm.   Abdomen:  soft, non-tender, and normal bowel sounds.   Msk:  no joint tenderness and no joint swelling.  , no spine tender Extremities:  no edema, no erythema  Neurologic:  cranial nerves  II-XII intact and strength normal in all extremities.     Impression & Recommendations:  Problem # 1:  DIABETES MELLITUS, TYPE II (ICD-250.00)  His updated medication list for this problem includes:    Lisinopril 20 Mg Tabs (Lisinopril) .[name]... 1po once daily    Metformin Hcl 500 Mg Tb24 (Metformin hcl) .[name]... 2 by mouth qam and 2 in the pm    Aspirin 81 Mg Tabs (Aspirin) .[name]... Daily    Glimepiride 1 Mg Tabs (Glimepiride) .[name]... 1p o two times a day as needed blood sugar > 200    Lantus Solostar 100 Unit/ml Soln (Insulin glargine) ..... Use asd 15 units subcutaneously once daily  Orders: Diabetic Clinic Referral (Diabetic) uncontrolled,  gave sample lantus, and re-did rx,  also for referral to DM  clinic for education   Problem # 2:  BACK PAIN (ICD-724.5)  His updated medication list for this problem includes:    Aspirin 81 Mg Tabs (Aspirin) .[name]... Daily    Ibuprofen 200 Mg Tabs (Ibuprofen) .[name]... As needed    Flexeril 5 Mg Tabs (Cyclobenzaprine hcl) .[name]... 1 by mouth three times a day as needed stable overall by hx and exam, ok to continue meds/tx as is - refilled the flexeril as needed   Problem # 3:  HYPERTENSION (ICD-401.9)  His updated medication list for this problem includes:    Lisinopril 20 Mg Tabs (Lisinopril) .[name]... 1po once daily    Amlodipine Besylate 5 Mg Tabs (Amlodipine besylate) .[name]... 1 by mouth once daily  BP today: 112/80 Prior BP: 102/72 (08/22/2009)  Prior 10 Yr Risk Heart Disease: 6 % (02/17/2009)  Labs Reviewed: K+: 3.7 (08/07/2009) Creat: : 1.1 (08/07/2009)   Chol: 160 (08/07/2009)   HDL: 30.60 (08/07/2009)   LDL: 94 (08/07/2009)   TG: 177.0 (08/07/2009) stable overall by hx and exam, ok to continue meds/tx as is   Complete Medication List: 1)  Lisinopril 20 Mg Tabs (Lisinopril) .[name].. 1po once daily 2)  Fish Oil 1000 Mg Caps (Omega-3 fatty acids) .[name].. 1 by mouth qd 3)  Metformin Hcl 500 Mg Tb24 (Metformin hcl) .... 2 by mouth qam  and 2 in the pm 4)  Daily  Vitamins Tabs (Multiple vitamin) .[name].. 1 by mouth qd 5)  Onetouch Lancets Misc (Lancets) .... Check bld sugar qid 6)  Symbicort 160-4.5 Mcg/act Aero (Budesonide-formoterol fumarate) .... 2 puffs two times a day 7)  Freestyle Lite Test Strp (Glucose blood) .... Use asd 1 once daily 8)  Celexa 10 Mg Tabs (Citalopram hydrobromide) .[name].. 1 by mouth once daily 9)  Aspirin 81 Mg Tabs (Aspirin) .... Daily 10)  Ibuprofen 200 Mg Tabs (Ibuprofen) .... As needed 11)  Simvastatin 80 Mg Tabs (Simvastatin) .[name].. 1po once daily 12)  Amlodipine Besylate 5 Mg Tabs (Amlodipine besylate) .[name].. 1 by mouth once daily 13)  Hydrocodone-homatropine 5-1.5 Mg/75ml Syrp (Hydrocodone-homatropine) .[name].. 1 tsp by mouth q 6 hrs as needed 14)  Flexeril 5 Mg Tabs (Cyclobenzaprine hcl) .[name].. 1 by mouth three times a day as needed 15)  Nexium 40 Mg Cpdr (Esomeprazole magnesium) .[name].. 1 capsule each day 30 minutes before meal 16)  Glimepiride 1 Mg Tabs (Glimepiride) .[name].. 1p o two times a day as needed blood sugar > 200 17)  Lantus Solostar 100 Unit/ml Soln (Insulin glargine) .... Use asd 15 units subcutaneously once daily 18)  Lantus Solostar Needles  .... Use asd 1 once daily  Patient Instructions: 1)  Continue all previous medications as before this visit 2)  you will be contacted about the referral to dietary  3)  Please schedule a follow-up appointment as planned (june 2011) Prescriptions: LANTUS SOLOSTAR NEEDLES use asd 1 once daily  #100 x 11   Entered and Authorized by:   [name]   Signed by:   [name] on 09/22/2009   Method used:   Print then Give to Patient   RxID:   [id number] FLEXERIL 5 MG TABS (CYCLOBENZAPRINE HCL) 1 by mouth three times a day as needed  #90 x 1   Entered and Authorized by:   [name]   Signed by:   [name] on 09/22/2009   Method used:   Print then Give to Patient   RxID:   [id number] FLEXERIL 5 MG TABS (CYCLOBENZAPRINE HCL) 1 by mouth three times a day as  needed  #90 x 1   Entered and Authorized by:   [name]   Signed by:   [name] on 09/22/2009   Method used:   Electronically to        [facility] [address] [phone]* (retail)       [address]       [place], [place]  [address]       Ph: [phone] or [phone]       Fax: [phone]   RxID:   [id number] LANTUS SOLOSTAR 100 UNIT/ML SOLN (INSULIN GLARGINE) use asd 15 units subcutaneously once daily  #1 box x 11   Entered and Authorized by:   [name]   Signed by:   [name] on 09/22/2009   Method used:   [facility] to        [facility] [address] [phone]* (retail)       [address]       [place], [place]  [address]       Ph: [phone] or [phone]       Fax: [phone]   RxID:  [id number]  

## 2010-08-27 NOTE — Medication Information (Signed)
Summary: [facility]  [facility]   Imported By: Lester Dayton 09/22/2009 08:00:04  _____________________________________________________________________  External Attachment:    Type:   Image     Comment:   External Document

## 2010-08-27 NOTE — Letter (Signed)
Summary: [facility] CPAP Supplies  [facility]   Imported By: Valinda Hoar 11/03/2009 13:48:18  _____________________________________________________________________  External Attachment:    Type:   Image     Comment:   External Document

## 2010-08-27 NOTE — Progress Notes (Signed)
Summary: Call report  Phone Note Other Incoming   Caller: [facility] Summary of Call: [facility] Triage Call Report Triage Record Num: [id number] Operator: Jeraldine Loots Patient Name: Wayne [name] Call Date & Time: 05/21/2010 5:46:23PM Patient Phone: [phone] PCP: Oliver Barre Patient Gender: Male PCP Fax : [phone] Patient DOB: [birth year] Practice Name: [facility] Reason for Call: Pt calling, having pain in right shoulder and rib cage. Started about 30 minutes ago. It hurts to take a deep breathe. No cold sx. Having heaviness and tightness in his chest. **Only taking his insulin, cannot afford other medications. 911 disposition Protocol(s) Used: Shoulder Non-Injury Recommended Outcome per Protocol: Activate EMS 911 Reason for Outcome: New or worsening shoulder pain AND any cardiac signs/symptoms lasting for more than 5 minutes now or within last hour New unexplained weakness/paralysis, change in sensation (numbness or tingling) or inability to purposely move, especially when one side of body is involved Care Advice:  ~ 10/ Initial call taken by: Margaret Pyle, CMA,  May 22, 2010 8:34 AM  Follow-up for Phone Call        noted Follow-up by: [name],  May 22, 2010 12:52 PM

## 2010-08-27 NOTE — Assessment & Plan Note (Signed)
Summary: cat scratch,bite/john/cd   Vital Signs:  Patient profile:   35 year old male Height:      74 inches (187.96 cm) Weight:      201 pounds (91.36 kg) O2 Sat:      97 % on Room air Temp:     98.6 degrees F (37.00 degrees C) oral Pulse rate:   97 / minute BP sitting:   130 / 72  (left arm) Cuff size:   regular  Vitals Entered By: Orlan Leavens RMA (August 19, 2010 11:36 AM)  O2 Flow:  Room air CC: cat bite finger Is Patient Diabetic? Yes Did you bring your meter with you today? Yes Pain Assessment Patient in pain? no        Primary Care Provider:  [name]  CC:  cat bite finger.  History of Present Illness: c/o cat sratch to left middle finger onset 24h ago precipitated by picking up angry pet (mom's cat who is up to date on shots) has tx self with soaks but c/o inc pain denies swelling, redness or drainage no dec ROM of finger or hand no hx same  Current Medications (verified): 1)  Onetouch Lancets   Misc (Lancets) .... Check Bld Sugar Qid 2)  Freestyle Lite Test  Strp (Glucose Blood) .... Use Asd 1 Once Daily 3)  Glipizide 10 Mg Xr24h-Tab (Glipizide) .[name].. 1 By Mouth Once Daily 4)  Lantus Solostar 100 Unit/ml Soln (Insulin Glargine) .... Use Asd 45 Units Subcutaneously Once Daily 5)  Humalog Kwikpen 100 Unit/ml Soln (Insulin Lispro (Human)) .... Use Asd Ssi 6)  Sertraline Hcl 100 Mg Tabs (Sertraline Hcl) .[name].. 1 By Mouth Once Daily 7)  Lipitor 10 Mg Tabs (Atorvastatin Calcium) .[name].. 1po Once Daily 8)  Nexium 40 Mg Cpdr (Esomeprazole Magnesium) .[name].. 1po Once Daily 9)  Nystatin 100000 Unit/ml Susp (Nystatin) .... 5 Cc By Mouth Four Time Per Day As Needed 10)  Ibuprofen 800 Mg Tabs (Ibuprofen) .[name].. 1po Q 6 Hrs As Needed 11)  Lisinopril 10 Mg Tabs (Lisinopril) .[name].. 1 By Mouth Once Daily 12)  Metformin Hcl 500 Mg Tabs (Metformin Hcl) .[name].. 1 By Mouth Two Times A Day With Meals 13)  Aspir-Low 81 Mg Tbec (Aspirin) .[name].. 1 By Mouth Once Daily 14)  Fish Oil 1000 Mg  Caps (Omega-3 Fatty Acids) .[name].. 1 By Mouth Once Daily 15)  Glucosamine 500 Mg Caps (Glucosamine Sulfate) .[name].. 1 By Mouth Once Daily 16)  Multivitamins  Caps (Multiple Vitamin) .[name].. 1 By Mouth Once Daily 17)  B-100  Tabs (Vitamins-Lipotropics) .[name].. 1 By Mouth Once Daily 18)  Vitamin D 1000 Unit Tabs (Cholecalciferol) .[name].. 1 By Mouth Once Daily 19)  Doxycycline Hyclate 100 Mg Caps (Doxycycline Hyclate) .[name].. 1po Two Times A Day  Allergies (verified): No Known Drug Allergies  Past History:  Past Medical History: 1. Depression 2. Diabetes mellitus, type II 3. Hyperlipidemia 4. Hypertension 5. Headaches 6. Osteoarthritis 7. Hx of Kidney Stones 8. polysubstance use - cocaine, marijuana (quit summer, 2010)  9. Tobacco abuse 10. Asthma 11. Echo (6/10): EF 55-60% with mild LV hypertrophy, no regional wall motion abnormalities, normal diastolic function, normal RV, no pericardial effusion, normal PA systolic pressure.  12.  Chest pain: Possible episode of acute pericarditis in 6/10. ETT (7/10): 9', 10.1 METS, developed chest pain but no ST segment changes.  Negative study.  Lumbar Disc disease with sciatica right bunion  GERD OSA  Review of Systems  The patient denies fever, headaches, and  suspicious skin lesions.    Physical Exam  General:  alert, well-developed, well-nourished, and cooperative to examination.   nontoxic Lungs:  normal respiratory effort, no intercostal retractions or use of accessory muscles; normal breath sounds bilaterally - no crackles and no wheezes.    Heart:  normal rate, regular rhythm, no murmur, and no rub. BLE without edema. Msk:  left hand FROM, no swelling, erythema or edema to fingers or palm - nontender FROM ext and flexion all digits Skin:  small superficial scratch to left long finger at lateral tip, clean and dry, no erythema or drainage -nontender to direct pressure, no edema   Impression & Recommendations:  Problem # 1:  CAT SCRATCH  (ICD-959.9) superficial, no signs or symptoms infx educated on wound care (warm water and h2o2 soaks) + neosporin and cover no oral abx needed fu as needed - call if worse  Complete Medication List: 1)  Onetouch Lancets Misc (Lancets) .... Check bld sugar qid 2)  Freestyle Lite Test Strp (Glucose blood) .... Use asd 1 once daily 3)  Glipizide 10 Mg Xr24h-tab (Glipizide) .[name].. 1 by mouth once daily 4)  Lantus Solostar 100 Unit/ml Soln (Insulin glargine) .... Use asd 45 units subcutaneously once daily 5)  Humalog Kwikpen 100 Unit/ml Soln (Insulin lispro (human)) .... Use asd ssi 6)  Sertraline Hcl 100 Mg Tabs (Sertraline hcl) .[name].. 1 by mouth once daily 7)  Lipitor 10 Mg Tabs (Atorvastatin calcium) .[name].. 1po once daily 8)  Nexium 40 Mg Cpdr (Esomeprazole magnesium) .[name].. 1po once daily 9)  Nystatin 100000 Unit/ml Susp (Nystatin) .... 5 cc by mouth four time per day as needed 10)  Ibuprofen 800 Mg Tabs (Ibuprofen) .[name].. 1po q 6 hrs as needed 11)  Lisinopril 10 Mg Tabs (Lisinopril) .[name].. 1 by mouth once daily 12)  Metformin Hcl 500 Mg Tabs (Metformin hcl) .[name].. 1 by mouth two times a day with meals 13)  Aspir-low 81 Mg Tbec (Aspirin) .[name].. 1 by mouth once daily 14)  Fish Oil 1000 Mg Caps (Omega-3 fatty acids) .[name].. 1 by mouth once daily 15)  Glucosamine 500 Mg Caps (Glucosamine sulfate) .[name].. 1 by mouth once daily 16)  Multivitamins Caps (Multiple vitamin) .[name].. 1 by mouth once daily 17)  B-100 Tabs (Vitamins-lipotropics) .[name].. 1 by mouth once daily 18)  Vitamin D 1000 Unit Tabs (Cholecalciferol) .[name].. 1 by mouth once daily 19)  Doxycycline Hyclate 100 Mg Caps (Doxycycline hyclate) .[name].. 1po two times a day  Patient Instructions: 1)  it was good to see you today. 2)  soak your finger tip in warm water and hydrogen peroxide solution (1/2 of each) every 3 hours while awake - just like we did in office today 3)  then cover with neosporin and bandaid -   4)  repeat process daily for 5-7 days 5)  if you  develop worsening symptoms or fever, call us and we can reconsider antibiotics but it does not appear necessary to use any anitbiotic at this time  6)  Please schedule a follow-up appointment as needed.   Orders Added: 1)  Est. Patient Level III [16109]

## 2010-08-27 NOTE — Progress Notes (Signed)
Summary: Disability form/[employer]  Disability form/[employer]   Imported By: Sherian Rein 02/19/2010 09:00:23  _____________________________________________________________________  External Attachment:    Type:   Image     Comment:   External Document

## 2010-08-27 NOTE — Progress Notes (Signed)
Summary: increased CBGs  Phone Note Call from Patient Call back at Home Phone [phone]   Caller: Patient Summary of Call: pt called stating that his CBG's has been high all day and is now 356. pt has not been taking Januvia and Actos because "it cost too much". Pt has however taken his Glimepiride today. please advise Initial call taken by: Margaret Pyle, CMA,  August 21, 2009 4:12 PM  Follow-up for Phone Call        plesae consider OV to start insulin as there are on other alternatives Follow-up by: [name],  August 21, 2009 4:48 PM  Additional Follow-up for Phone Call Additional follow up Details #1::        left message on machine informing pt of above and told to call back to schedule or speak with traige. Additional Follow-up by: Margaret Pyle, CMA,  August 21, 2009 4:59 PM    Additional Follow-up for Phone Call Additional follow up Details #2::    left message on machine for pt to return my call. Margaret Pyle, CMA  August 22, 2009 8:11 AM   Additional Follow-up for Phone Call Additional follow up Details #3:: Details for Additional Follow-up Action Taken: pt scheduled appt Additional Follow-up by: Margaret Pyle, CMA,  August 22, 2009 8:39 AM

## 2010-08-27 NOTE — Letter (Signed)
Summary: Diabetes-out of control/[facility]  Diabetes-out of control/[facility]   Imported By: Sherian Rein 08/08/2009 07:00:04  _____________________________________________________________________  External Attachment:    Type:   Image     Comment:   External Document

## 2010-08-27 NOTE — Progress Notes (Signed)
  Phone Note Outgoing Call   Call placed by: Robin Call placed to: Patient Summary of Call: Called pt. to inform Lantus has arrived from patient assitance. Spoke to pts. mother who will inform pt. to pickup. She did inform that patient is in the hospital since 07/13/2010 and will return home tomorrow 07/16/2010 and can pickup insulin or family member will pickup for the patient. Initial call taken by: Robin Ewing CMA (AAMA),  July 15, 2010 11:05 AM

## 2010-09-16 ENCOUNTER — Other Ambulatory Visit: Payer: Self-pay

## 2010-09-23 ENCOUNTER — Encounter: Payer: Self-pay | Admitting: Internal Medicine

## 2010-09-23 ENCOUNTER — Ambulatory Visit (INDEPENDENT_AMBULATORY_CARE_PROVIDER_SITE_OTHER): Payer: PRIVATE HEALTH INSURANCE | Admitting: Internal Medicine

## 2010-09-23 DIAGNOSIS — E119 Type 2 diabetes mellitus without complications: Secondary | ICD-10-CM

## 2010-09-23 DIAGNOSIS — I10 Essential (primary) hypertension: Secondary | ICD-10-CM

## 2010-09-23 DIAGNOSIS — K219 Gastro-esophageal reflux disease without esophagitis: Secondary | ICD-10-CM

## 2010-09-23 DIAGNOSIS — E785 Hyperlipidemia, unspecified: Secondary | ICD-10-CM

## 2010-10-01 NOTE — Assessment & Plan Note (Signed)
Summary: 2 MO ROV /NWS   Vital Signs:  Patient profile:   35 year old male Height:      73 inches Weight:      192.13 pounds BMI:     25.44 O2 Sat:      96 % on Room air Temp:     98.6 degrees F oral Pulse rate:   95 / minute BP sitting:   110 / 64  (left arm) Cuff size:   regular  Vitals Entered By: Zella Ball Ewing CMA Duncan Dull) (September 23, 2010 2:04 PM)  O2 Flow:  Room air CC: 2 month ROV/RE   Primary Care Provider:  [name]  CC:  2 month ROV/RE.  History of Present Illness: here to f/u;  overall doing ok;  now with better able to afford meds, though still paying out of pocket for the humalog;  Pt denies CP, worsening sob, doe, wheezing, orthopnea, pnd, worsening LE edema, palps, dizziness or syncope  Pt denies new neuro symptoms such as headache, facial or extremity weakness  Pt denies polydipsia, polyuria, or low sugar symptoms such as shakiness improved with eating.  Overall good compliance with meds, trying to follow low chol, DM diet, little excercise however , but lost wt from 244 in last 6 mo to curret 192 (likely due to ongoing poorly controlled DM), no longer using the CPAP as he is no longer symtpomatic with daytime somnolence.  CBG's still in the 200's for the most part.  Overall good compliance with meds, and good tolerability. No fever, wt loss, night sweats, loss of appetite or other constitutional symptoms Denies worsening depressive symptoms, suicidal ideation, or panic.  Nexium is too expensive, and still with symtpomatic reflux, without dysphagia, n/v, abd pain, bowel change or blood.     Problems Prior to Update: 1)  Cat Scratch  (ICD-959.9) 2)  Obstructive Sleep Apnea  (ICD-327.23) 3)  Sinusitis- Acute-nos  (ICD-461.9) 4)  Gerd  (ICD-530.81) 5)  Uri  (ICD-465.9) 6)  Toe Pain  (ICD-729.5) 7)  Thrush  (ICD-112.0) 8)  Frequency, Urinary  (ICD-788.41) 9)  Obstructive Sleep Apnea  (ICD-327.23) 10)  Back Pain  (ICD-724.5) 11)  Finger Pain  (ICD-729.5) 12)   Hypersomnia  (ICD-780.54) 13)  Heartburn  (ICD-787.1) 14)  Hemorrhage of Rectum and Anus  (ICD-569.3) 15)  Ibs  (ICD-564.1) 16)  Hematochezia  (ICD-578.1) 17)  Hepatotoxicity, Drug-induced, Risk of  (ICD-V58.69) 18)  Bunions, Right Foot  (ICD-727.1) 19)  Sciatica, Right  (ICD-724.3) 20)  Sinusitis- Acute-nos  (ICD-461.9) 21)  Contact With or Exposure To Venereal Diseases  (ICD-V01.6) 22)  Dysuria  (ICD-788.1) 23)  Family History of Cad Male 1st Degree Relative <60  (ICD-V16.49) 24)  Hypertension  (ICD-401.9) 25)  Fracture, Index Finger, Right  (ICD-816.00) 26)  Wrist Pain, Right  (ICD-719.43) 27)  Chest Pain  (ICD-786.50) 28)  Hand Pain, Right  (ICD-729.5) 29)  Substance Abuse, Multiple  (ICD-305.90) 30)  Preventive Health Care  (ICD-V70.0) 31)  Dyspnea/shortness of Breath  (ICD-786.09) 32)  Lung Nodule  (ICD-518.89) 33)  Shoulder Pain, Right  (ICD-719.41) 34)  Wrist Pain, Left  (ICD-719.43) 35)  Preventive Health Care  (ICD-V70.0) 36)  Hypogonadism, 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 Check Bld Sugar Qid 2)  Freestyle Lite Test  Strp (Glucose Blood) .... Use Asd 1 Once Daily 3)  Glipizide 10 Mg Xr24h-Tab (Glipizide) .[name].. 1 By Mouth Once Daily 4)  Lantus Solostar 100 Unit/ml Soln (Insulin Glargine) .... Use Asd 45 Units Subcutaneously Once Daily 5)  Humalog Kwikpen 100 Unit/ml Soln (Insulin Lispro (Human)) .... Use Asd Ssi 6)  Sertraline Hcl 100 Mg Tabs (Sertraline Hcl) .[name].. 1 By Mouth Once Daily 7)  Lipitor 10 Mg Tabs (Atorvastatin Calcium) .[name].. 1po Once  Daily 8)  Nexium 40 Mg Cpdr (Esomeprazole Magnesium) .[name].. 1po Once Daily 9)  Nystatin 100000 Unit/ml Susp (Nystatin) .... 5 Cc By Mouth Four Time Per Day As Needed 10)  Ibuprofen 800 Mg Tabs (Ibuprofen) .[name].. 1po Q 6 Hrs As Needed 11)  Lisinopril 10 Mg Tabs (Lisinopril) .[name].. 1 By Mouth Once Daily 12)  Metformin Hcl 500 Mg Tabs (Metformin Hcl) .[name].. 1 By Mouth Two Times A Day With Meals 13)  Aspir-Low 81 Mg Tbec (Aspirin) .[name].. 1 By Mouth Once Daily 14)  Fish Oil 1000 Mg Caps (Omega-3 Fatty Acids) .[name].. 1 By Mouth Once Daily 15)  Glucosamine 500 Mg Caps (Glucosamine Sulfate) .[name].. 1 By Mouth Once Daily 16)  Multivitamins  Caps (Multiple Vitamin) .[name].. 1 By Mouth Once Daily 17)  B-100  Tabs (Vitamins-Lipotropics) .[name].. 1 By Mouth Once Daily 18)  Vitamin D 1000 Unit Tabs (Cholecalciferol) .[name].. 1 By Mouth Once Daily 19)  Doxycycline Hyclate 100 Mg Caps (Doxycycline Hyclate) .[name].. 1po Two Times A Day  Current Medications (verified): 1)  Onetouch Lancets   Misc (Lancets) .... Check Bld Sugar Qid 2)  Freestyle Lite Test  Strp (Glucose Blood) .... Use Asd 1 Once Daily 3)  Glipizide 10 Mg Xr24h-Tab (Glipizide) .[name].. 1 By Mouth Once Daily 4)  Lantus Solostar 100 Unit/ml Soln (Insulin Glargine) .... Use Asd 50 Units Subcutaneously Once Daily 5)  Humalog Kwikpen 100 Unit/ml Soln (Insulin Lispro (Human)) .... Use Asd  4 Units Three Times A Day 6)  Sertraline Hcl 100 Mg Tabs (Sertraline Hcl) .[name].. 1 By Mouth Once Daily 7)  Lipitor 10 Mg Tabs (Atorvastatin Calcium) .[name].. 1po Once Daily 8)  Lansoprazole 30 Mg Cpdr (Lansoprazole) .[name].. 1 By Mouth Once Daily 9)  Nystatin 100000 Unit/ml Susp (Nystatin) .... 5 Cc By Mouth Four Time Per Day As Needed 10)  Ibuprofen 800 Mg Tabs (Ibuprofen) .[name].. 1po Q 6 Hrs As Needed 11)  Lisinopril 10 Mg Tabs (Lisinopril) .[name].. 1 By Mouth Once Daily 12)  Metformin Hcl 500 Mg Tabs (Metformin Hcl) .[name].. 1 By Mouth Two Times A Day With Meals 13)  Aspir-Low 81 Mg Tbec (Aspirin) .[name].. 1 By Mouth  Once Daily 14)  Fish Oil 1000 Mg Caps (Omega-3 Fatty Acids) .[name].. 1 By Mouth Once Daily 15)  Glucosamine 500 Mg Caps (Glucosamine Sulfate) .[name].. 1 By Mouth Once Daily 16)  Multivitamins  Caps (Multiple Vitamin) .[name].. 1 By Mouth Once Daily 17)  B-100  Tabs (Vitamins-Lipotropics) .[name].. 1 By Mouth Once Daily 18)  Vitamin D 1000 Unit Tabs (Cholecalciferol) .[name].. 1 By Mouth Once Daily  Allergies (verified): No Known Drug Allergies  Past History:  Past Medical History: Last updated: 08/19/2010 1. Depression 2. Diabetes mellitus, type II 3. Hyperlipidemia 4. Hypertension 5. Headaches 6. Osteoarthritis 7. Hx of Kidney Stones 8. polysubstance use - cocaine,  marijuana (quit summer, 2010)  9. Tobacco abuse 10. Asthma 11. Echo (6/10): EF 55-60% with mild LV hypertrophy, no regional wall motion abnormalities, normal diastolic function, normal RV, no pericardial effusion, normal PA systolic pressure.  12.  Chest pain: Possible episode of acute pericarditis in 6/10. ETT (7/10): 9', 10.1 METS, developed chest pain but no ST segment changes.  Negative study.  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  Started at age 70. 1 pack cigarettes/week.  Quit Dec 2009. 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   nontoxic Head:  normocephalic and atraumatic.   Eyes:  vision grossly intact, pupils equal, and  pupils round.   Ears:  R ear normal and L ear normal.   Nose:  no external deformity and no nasal discharge.   Mouth:  no gingival abnormalities and pharynx pink and moist.   Neck:  supple and no masses.   Lungs:  normal respiratory effort and normal breath sounds.   Heart:  normal rate and regular rhythm.   Extremities:  no edema, no erythema    Impression & Recommendations:  Problem # 1:  DIABETES MELLITUS, TYPE II (ICD-250.00)  His updated medication list for this problem includes:    Glipizide 10 Mg Xr24h-tab (Glipizide) .[name]... 1 by mouth once daily    Lantus Solostar 100 Unit/ml Soln (Insulin glargine) ..... Use asd 50 units subcutaneously once daily    Humalog Kwikpen 100 Unit/ml Soln (Insulin lispro (human)) ..... Use asd  4 units three times a day    Lisinopril 10 Mg Tabs (Lisinopril) .[name]... 1 by mouth once daily    Metformin Hcl 500 Mg Tabs (Metformin hcl) .[name]... 1 by mouth two times a day with meals    Aspir-low 81 Mg Tbec (Aspirin) .[name]... 1 by mouth once daily  Labs Reviewed: Creat: 1.2 (06/05/2010)    Reviewed HgBA1c results: 14.3 (06/05/2010)  12.8 (11/13/2009) CBG's in the 200's - treat as above, f/u any worsening signs or symptoms . Pt to cont DM diet, excercise, wt control efforts; to check labs today  Orders: TLB-BMP (Basic Metabolic Panel-BMET) (80048-METABOL) TLB-Lipid Panel (80061-LIPID) TLB-A1C / Hgb A1C (Glycohemoglobin) (83036-A1C)  Problem # 2:  GERD (ICD-530.81)  His updated medication list for this problem includes:    Lansoprazole 30 Mg Cpdr (Lansoprazole) .[name]... 1 by mouth once daily nexium too expensive - treat as above, f/u any worsening signs or symptoms   EGD: DONE (07/09/2009)  Labs Reviewed: Hgb: 13.7 (01/10/2009)   Hct: 38.3 (01/10/2009)  Problem # 3:  OBSTRUCTIVE SLEEP APNEA (ICD-327.23) declines further CPAP at this time , after his wt loss  Problem # 4:  HYPERLIPIDEMIA (ICD-272.4)  His updated medication list for this problem  includes:    Lipitor 10 Mg Tabs (Atorvastatin calcium) .[name]... 1po once daily  Labs Reviewed: SGOT: 29 (06/02/2009)   SGPT: 30 (06/02/2009)  Prior 10 Yr Risk Heart Disease: 6 % (02/17/2009)   HDL:42.80 (  06/05/2010), 40.50 (11/13/2009)  LDL:94 (08/07/2009), 84 (06/02/2009)  Chol:231 (06/05/2010), 194 (11/13/2009)  Trig:257.0 (06/05/2010), 325.0 (11/13/2009) stable overall by hx and exam, ok to continue meds/tx as is , Pt to continue diet efforts, good med tolerance; to check labs - goal LDL less than 70   Problem # 5:  HYPERTENSION (ICD-401.9)  His updated medication list for this problem includes:    Lisinopril 10 Mg Tabs (Lisinopril) .[name]... 1 by mouth once daily  BP today: 110/64 Prior BP: 130/72 (08/19/2010)  Prior 10 Yr Risk Heart Disease: 6 % (02/17/2009)  Labs Reviewed: K+: 4.5 (06/05/2010) Creat: : 1.2 (06/05/2010)   Chol: 231 (06/05/2010)   HDL: 42.80 (06/05/2010)   LDL: 94 (08/07/2009)   TG: 257.0 (06/05/2010) stable overall by hx and exam, ok to continue meds/tx as is   Complete Medication List: 1)  Onetouch Lancets Misc (Lancets) .... Check bld sugar qid 2)  Freestyle Lite Test Strp (Glucose blood) .... Use asd 1 once daily 3)  Glipizide 10 Mg Xr24h-tab (Glipizide) .[name].. 1 by mouth once daily 4)  Lantus Solostar 100 Unit/ml Soln (Insulin glargine) .... Use asd 50 units subcutaneously once daily 5)  Humalog Kwikpen 100 Unit/ml Soln (Insulin lispro (human)) .... Use asd  4 units three times a day 6)  Sertraline Hcl 100 Mg Tabs (Sertraline hcl) .[name].. 1 by mouth once daily 7)  Lipitor 10 Mg Tabs (Atorvastatin calcium) .[name].. 1po once daily 8)  Lansoprazole 30 Mg Cpdr (Lansoprazole) .[name].. 1 by mouth once daily 9)  Nystatin 100000 Unit/ml Susp (Nystatin) .... 5 cc by mouth four time per day as needed 10)  Ibuprofen 800 Mg Tabs (Ibuprofen) .[name].. 1po q 6 hrs as needed 11)  Lisinopril 10 Mg Tabs (Lisinopril) .[name].. 1 by mouth once daily 12)  Metformin Hcl 500 Mg Tabs (Metformin hcl) .[name].. 1  by mouth two times a day with meals 13)  Aspir-low 81 Mg Tbec (Aspirin) .[name].. 1 by mouth once daily 14)  Fish Oil 1000 Mg Caps (Omega-3 fatty acids) .[name].. 1 by mouth once daily 15)  Glucosamine 500 Mg Caps (Glucosamine sulfate) .[name].. 1 by mouth once daily 16)  Multivitamins Caps (Multiple vitamin) .[name].. 1 by mouth once daily 17)  B-100 Tabs (Vitamins-lipotropics) .[name].. 1 by mouth once daily 18)  Vitamin D 1000 Unit Tabs (Cholecalciferol) .[name].. 1 by mouth once daily  Patient Instructions: 1)  increase the lantus to 50 units per day 2)  Continue all previous medications as before this visit , except change the nexium to the generic for prevacid (called lansoprazole) 3)  Please go to the Lab in the basement for your blood and/or urine tests today 4)  Please call the number on the [facility] Card for results of your testing  5)  Please schedule a follow-up appointment in 6 months. Prescriptions: LANSOPRAZOLE 30 MG CPDR (LANSOPRAZOLE) 1 by mouth once daily  #90 x 3   Entered and Authorized by:   [name]   Signed by:   [name] on 09/23/2010   Method used:   Print then Give to Patient   RxID:   [id number]    Orders Added: 1)  TLB-BMP (Basic Metabolic Panel-BMET) [80048-METABOL] 2)  TLB-Lipid Panel [80061-LIPID] 3)  TLB-A1C / Hgb A1C (Glycohemoglobin) [83036-A1C] 4)  Est. Patient Level IV [14782]

## 2010-10-05 LAB — BASIC METABOLIC PANEL
BUN: 14 mg/dL (ref 6–23)
BUN: 16 mg/dL (ref 6–23)
BUN: 22 mg/dL (ref 6–23)
CO2: 25 mEq/L (ref 19–32)
CO2: 26 mEq/L (ref 19–32)
Calcium: 10.4 mg/dL (ref 8.4–10.5)
Chloride: 107 mEq/L (ref 96–112)
Chloride: 109 mEq/L (ref 96–112)
Creatinine, Ser: 1.01 mg/dL (ref 0.4–1.5)
GFR calc Af Amer: >60 mL/min (ref >60)
GFR calc non Af Amer: >60 mL/min (ref >60)
Glucose, Bld: 220 mg/dL — ABNORMAL HIGH (ref 70–99)
Glucose, Bld: 614 mg/dL (ref 70–99)
Potassium: 3.7 mEq/L (ref 3.5–5.1)

## 2010-10-05 LAB — BASIC METABOLIC PANEL WITH GFR
CO2: 23 meq/L (ref 19–32)
Chloride: 96 meq/L (ref 96–112)
Creatinine, Ser: 1 mg/dL (ref 0.4–1.5)
GFR calc Af Amer: >60 mL/min (ref >60)
Potassium: 4.9 meq/L (ref 3.5–5.1)
Sodium: 138 meq/L (ref 135–145)

## 2010-10-05 LAB — GLUCOSE, CAPILLARY
Glucose-Capillary: 171 mg/dL — ABNORMAL HIGH (ref 70–99)
Glucose-Capillary: 190 mg/dL — ABNORMAL HIGH (ref 70–99)
Glucose-Capillary: 219 mg/dL — ABNORMAL HIGH (ref 70–99)
Glucose-Capillary: 240 mg/dL — ABNORMAL HIGH (ref 70–99)
Glucose-Capillary: 252 mg/dL — ABNORMAL HIGH (ref 70–99)
Glucose-Capillary: 260 mg/dL — ABNORMAL HIGH (ref 70–99)
Glucose-Capillary: 285 mg/dL — ABNORMAL HIGH (ref 70–99)
Glucose-Capillary: 356 mg/dL — ABNORMAL HIGH (ref 70–99)
Glucose-Capillary: 422 mg/dL — ABNORMAL HIGH (ref 70–99)
Glucose-Capillary: >600 mg/dL (ref 70–99)

## 2010-10-05 LAB — URINALYSIS, ROUTINE W REFLEX MICROSCOPIC
Bilirubin Urine: NEGATIVE
Glucose, UA: >1000 mg/dL — AB
Glucose, UA: >1000 mg/dL — AB
Hgb urine dipstick: NEGATIVE
Ketones, ur: 40 mg/dL — AB
Ketones, ur: NEGATIVE mg/dL
Leukocytes, UA: NEGATIVE
Leukocytes, UA: NEGATIVE
Nitrite: NEGATIVE
Protein, ur: NEGATIVE mg/dL
Specific Gravity, Urine: 1.035 — ABNORMAL HIGH (ref 1.005–1.030)
Urobilinogen, UA: 0.2 mg/dL (ref 0.0–1.0)
pH: 5.5 (ref 5.0–8.0)
pH: 6 (ref 5.0–8.0)

## 2010-10-05 LAB — DIFFERENTIAL
Basophils Absolute: 0 10*3/uL (ref 0.0–0.1)
Basophils Relative: 0 % (ref 0–1)
Eosinophils Absolute: 0.1 10*3/uL (ref 0.0–0.7)
Eosinophils Relative: 1 % (ref 0–5)
Lymphocytes Relative: 24 % (ref 12–46)
Lymphs Abs: 1.1 K/uL (ref 0.7–4.0)
Monocytes Absolute: 0.3 10*3/uL (ref 0.1–1.0)
Monocytes Relative: 6 % (ref 3–12)
Neutro Abs: 3.1 10*3/uL (ref 1.7–7.7)
Neutrophils Relative %: 69 % (ref 43–77)

## 2010-10-05 LAB — HEMOGLOBIN A1C

## 2010-10-05 LAB — CBC
HCT: 45.5 % (ref 39.0–52.0)
Hemoglobin: 16.2 g/dL (ref 13.0–17.0)
MCH: 31.3 pg (ref 26.0–34.0)
MCHC: 35.6 g/dL (ref 30.0–36.0)
MCV: 88 fL (ref 78.0–100.0)
Platelets: 195 K/uL (ref 150–400)
RBC: 5.17 MIL/uL (ref 4.22–5.81)
RDW: 12.8 % (ref 11.5–15.5)
WBC: 4.6 K/uL (ref 4.0–10.5)

## 2010-10-05 LAB — KETONES, QUALITATIVE

## 2010-10-05 LAB — URINE MICROSCOPIC-ADD ON: Urine-Other: NONE SEEN

## 2010-10-06 LAB — CBC
HCT: 44.9 % (ref 39.0–52.0)
MCH: 32 pg (ref 26.0–34.0)
MCHC: 35 g/dL (ref 30.0–36.0)
RDW: 12.4 % (ref 11.5–15.5)

## 2010-10-06 LAB — URINALYSIS, ROUTINE W REFLEX MICROSCOPIC
Glucose, UA: >1000 mg/dL — AB
Leukocytes, UA: NEGATIVE
Protein, ur: NEGATIVE mg/dL
pH: 5.5 (ref 5.0–8.0)

## 2010-10-06 LAB — URINE CULTURE
Culture  Setup Time: 201111122149
Culture: NO GROWTH

## 2010-10-06 LAB — COMPREHENSIVE METABOLIC PANEL
ALT: 47 U/L (ref 0–53)
Calcium: 10.3 mg/dL (ref 8.4–10.5)
Creatinine, Ser: 0.9 mg/dL (ref 0.4–1.5)
GFR calc Af Amer: >60 mL/min (ref >60)
GFR calc non Af Amer: >60 mL/min (ref >60)
Glucose, Bld: 217 mg/dL — ABNORMAL HIGH (ref 70–99)
Sodium: 147 mEq/L — ABNORMAL HIGH (ref 135–145)
Total Protein: 8.8 g/dL — ABNORMAL HIGH (ref 6.0–8.3)

## 2010-10-06 LAB — DIFFERENTIAL
Eosinophils Absolute: 0.1 10*3/uL (ref 0.0–0.7)
Lymphocytes Relative: 21 % (ref 12–46)
Lymphs Abs: 1.1 10*3/uL (ref 0.7–4.0)
Monocytes Relative: 10 % (ref 3–12)
Neutro Abs: 3.2 10*3/uL (ref 1.7–7.7)
Neutrophils Relative %: 64 % (ref 43–77)

## 2010-10-26 ENCOUNTER — Telehealth: Payer: Self-pay | Admitting: *Deleted

## 2010-10-26 NOTE — Telephone Encounter (Signed)
[id number].  Pt is due for a f/u appt with [facility].  Was last seen Nov 2011 for OSA (on cpap)  and told to f/u in 6 months.  Therefore pt will need f/u appt for May.

## 2010-10-27 LAB — GLUCOSE, CAPILLARY
Glucose-Capillary: 111 mg/dL — ABNORMAL HIGH (ref 70–99)
Glucose-Capillary: 127 mg/dL — ABNORMAL HIGH (ref 70–99)

## 2010-10-27 NOTE — Telephone Encounter (Signed)
Pt returned call

## 2010-10-27 NOTE — Telephone Encounter (Signed)
F/U APPT W/ KC SCHEDULED FOR 11/30/10. Wayne [name]

## 2010-11-02 LAB — CARDIAC PANEL(CRET KIN+CKTOT+MB+TROPI)
CK, MB: 0.7 ng/mL (ref 0.3–4.0)
Relative Index: 0.3 (ref 0.0–2.5)
Total CK: 249 U/L — ABNORMAL HIGH (ref 7–232)

## 2010-11-02 LAB — BASIC METABOLIC PANEL
Calcium: 9.2 mg/dL (ref 8.4–10.5)
GFR calc Af Amer: >60 mL/min (ref >60)
GFR calc non Af Amer: >60 mL/min (ref >60)
Glucose, Bld: 289 mg/dL — ABNORMAL HIGH (ref 70–99)
Potassium: 3.9 mEq/L (ref 3.5–5.1)
Sodium: 142 mEq/L (ref 135–145)

## 2010-11-02 LAB — URINALYSIS, ROUTINE W REFLEX MICROSCOPIC
Bilirubin Urine: NEGATIVE
Glucose, UA: >1000 mg/dL — AB
Ketones, ur: 15 mg/dL — AB
Leukocytes, UA: NEGATIVE
Specific Gravity, Urine: >1.046 — ABNORMAL HIGH (ref 1.005–1.030)
pH: 6 (ref 5.0–8.0)

## 2010-11-02 LAB — DIFFERENTIAL
Lymphocytes Relative: 19 % (ref 12–46)
Lymphs Abs: 1.3 10*3/uL (ref 0.7–4.0)
Monocytes Absolute: 1 10*3/uL (ref 0.1–1.0)
Monocytes Relative: 15 % — ABNORMAL HIGH (ref 3–12)
Neutro Abs: 4 10*3/uL (ref 1.7–7.7)
Neutrophils Relative %: 60 % (ref 43–77)

## 2010-11-02 LAB — COMPREHENSIVE METABOLIC PANEL
ALT: 33 U/L (ref 0–53)
AST: 32 U/L (ref 0–37)
CO2: 29 mEq/L (ref 19–32)
Chloride: 104 mEq/L (ref 96–112)
GFR calc Af Amer: >60 mL/min (ref >60)
GFR calc non Af Amer: >60 mL/min (ref >60)
Glucose, Bld: 243 mg/dL — ABNORMAL HIGH (ref 70–99)
Sodium: 144 mEq/L (ref 135–145)
Total Bilirubin: 0.4 mg/dL (ref 0.3–1.2)

## 2010-11-02 LAB — CBC
HCT: 35.6 % — ABNORMAL LOW (ref 39.0–52.0)
Hemoglobin: 12.4 g/dL — ABNORMAL LOW (ref 13.0–17.0)
Hemoglobin: 13.6 g/dL (ref 13.0–17.0)
MCV: 93.5 fL (ref 78.0–100.0)
RBC: 4.19 MIL/uL — ABNORMAL LOW (ref 4.22–5.81)
RDW: 13.4 % (ref 11.5–15.5)
WBC: 5.6 10*3/uL (ref 4.0–10.5)
WBC: 6.6 10*3/uL (ref 4.0–10.5)

## 2010-11-02 LAB — POCT CARDIAC MARKERS
CKMB, poc: <1 ng/mL — ABNORMAL LOW (ref 1.0–8.0)
Myoglobin, poc: 28.5 ng/mL (ref 12–200)
Myoglobin, poc: 65.1 ng/mL (ref 12–200)
Troponin i, poc: <0.05 ng/mL (ref 0.00–0.09)

## 2010-11-02 LAB — GLUCOSE, CAPILLARY
Glucose-Capillary: 180 mg/dL — ABNORMAL HIGH (ref 70–99)
Glucose-Capillary: 250 mg/dL — ABNORMAL HIGH (ref 70–99)
Glucose-Capillary: 300 mg/dL — ABNORMAL HIGH (ref 70–99)

## 2010-11-02 LAB — URINE CULTURE
Colony Count: NO GROWTH
Culture: NO GROWTH

## 2010-11-02 LAB — RAPID URINE DRUG SCREEN, HOSP PERFORMED
Barbiturates: NOT DETECTED
Opiates: NOT DETECTED

## 2010-11-02 LAB — URINE MICROSCOPIC-ADD ON

## 2010-11-02 LAB — CK TOTAL AND CKMB (NOT AT ARMC)
CK, MB: 0.6 ng/mL (ref 0.3–4.0)
Total CK: 214 U/L (ref 7–232)

## 2010-11-02 LAB — TROPONIN I: Troponin I: 0.01 ng/mL (ref 0.00–0.06)

## 2010-11-03 LAB — URINE MICROSCOPIC-ADD ON

## 2010-11-03 LAB — CBC
HCT: 43 % (ref 39.0–52.0)
MCHC: 33.9 g/dL (ref 30.0–36.0)
MCV: 95 fL (ref 78.0–100.0)
RBC: 4.52 MIL/uL (ref 4.22–5.81)
WBC: 5.4 10*3/uL (ref 4.0–10.5)

## 2010-11-03 LAB — RAPID URINE DRUG SCREEN, HOSP PERFORMED
Amphetamines: NOT DETECTED
Barbiturates: NOT DETECTED
Benzodiazepines: NOT DETECTED
Cocaine: POSITIVE — AB
Opiates: NOT DETECTED
Tetrahydrocannabinol: NOT DETECTED

## 2010-11-03 LAB — GLUCOSE, CAPILLARY
Glucose-Capillary: 251 mg/dL — ABNORMAL HIGH (ref 70–99)
Glucose-Capillary: 261 mg/dL — ABNORMAL HIGH (ref 70–99)
Glucose-Capillary: 283 mg/dL — ABNORMAL HIGH (ref 70–99)
Glucose-Capillary: 303 mg/dL — ABNORMAL HIGH (ref 70–99)
Glucose-Capillary: 324 mg/dL — ABNORMAL HIGH (ref 70–99)
Glucose-Capillary: 326 mg/dL — ABNORMAL HIGH (ref 70–99)
Glucose-Capillary: 331 mg/dL — ABNORMAL HIGH (ref 70–99)
Glucose-Capillary: 343 mg/dL — ABNORMAL HIGH (ref 70–99)
Glucose-Capillary: 353 mg/dL — ABNORMAL HIGH (ref 70–99)
Glucose-Capillary: 361 mg/dL — ABNORMAL HIGH (ref 70–99)
Glucose-Capillary: 378 mg/dL — ABNORMAL HIGH (ref 70–99)
Glucose-Capillary: 394 mg/dL — ABNORMAL HIGH (ref 70–99)

## 2010-11-03 LAB — URINALYSIS, ROUTINE W REFLEX MICROSCOPIC
Bilirubin Urine: NEGATIVE
Hgb urine dipstick: NEGATIVE
Ketones, ur: 40 mg/dL — AB
Nitrite: NEGATIVE
Protein, ur: NEGATIVE mg/dL
Urobilinogen, UA: 0.2 mg/dL (ref 0.0–1.0)

## 2010-11-03 LAB — BASIC METABOLIC PANEL
CO2: 25 mEq/L (ref 19–32)
Chloride: 104 mEq/L (ref 96–112)
Creatinine, Ser: 0.95 mg/dL (ref 0.4–1.5)
GFR calc Af Amer: >60 mL/min (ref >60)
Potassium: 3.8 mEq/L (ref 3.5–5.1)

## 2010-11-26 ENCOUNTER — Encounter: Payer: Self-pay | Admitting: Pulmonary Disease

## 2010-11-30 ENCOUNTER — Ambulatory Visit: Payer: PRIVATE HEALTH INSURANCE | Admitting: Pulmonary Disease

## 2010-12-08 NOTE — H&P (Signed)
[name], Wayne [name]                   ACCOUNT NO.:  [id number]   MEDICAL RECORD NO.:  [id number]          PATIENT TYPE:  INP   LOCATION:  3707                         FACILITY:  [facility]   PHYSICIAN:  Audery Amel, MD    DATE OF BIRTH:  [birth year]   DATE OF ADMISSION:  12/30/2008  DATE OF DISCHARGE:                              HISTORY & PHYSICAL   CHIEF COMPLAINT:  Chest pain.   HISTORY OF PRESENT ILLNESS:  Wayne [name] is a 35 year old African American  male with past medical history notable for polysubstance abuse,  hypertension, diabetes, hyperlipidemia, history who was transferred from  [facility] for further evaluation of chest pain.  On Friday  of this past week, the patient developed symptoms consistent with an  upper respiratory tract infection.  Subsequently, he developed a very  sharp chest pain which he describes was pleuritic in nature.  He states  that the symptoms are made worse with deep inspiration and cough.  He  denies any association with exertion.  He denies PND, orthopnea,  palpitations, presyncope or syncope.  He presented to the urgent care  facility where an EKG was obtained and based on the initial  interpretation, there was concern for acute coronary syndrome.  Therefore he was transferred to [facility] for further evaluation.  On  arrival, the patient was hemodynamically stable and chest pain free with  the exception of when he took a deep breath or cough.  His EKG here at  [facility] reveals diffuse ST elevation with PR depression in lead I and  II.  Also of note, the patient states that he used intranasal cocaine  earlier in the day and he has a long history of polysubstance abuse per  medical records.   PAST MEDICAL HISTORY:  1. Polysubstance abuse.  2. Depressive disorder.  3. Diabetes.  4. Hyperlipidemia.  5. Hypertension.  6. Asthma.   CURRENT HOME MEDICATIONS:  Per his report:  1. Januvia 100 mg daily.  2. Actos 45 mg daily  3.  Metformin 500 mg b.i.d.  4. Glipizide 10 mg b.i.d.  5. Celexa 10 mg daily  6. Simvastatin 20 mg daily  7. Symbicort 1 daily.  8. Lisinopril 10 mg daily.   DRUG ALLERGIES:  No known drug allergies.   SOCIAL HISTORY:  The patient is currently homeless.  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  He has a long history of polysubstance  abuse and, as previously stated, used cocaine earlier today.  The  patient also has a history of tobacco abuse.   FAMILY HISTORY:  The patient states that his mother had open heart  surgery in her 64s.  Otherwise, there is no significant history of  coronary disease.   REVIEW OF SYSTEMS:  As per HPI, otherwise complete review of systems was  obtained and negative except as documented.   PHYSICAL EXAMINATION:  Blood pressure 123/76, heart rate 77, O2 sat is  100% on room air.  GENERAL: The patient is alert and oriented x3 in no acute distress.  NECK:  Supple, full range of motion, no appreciable JVD.  Carotid  upstrokes are equal and symmetric bilaterally with no audible bruits.  There is no palpable thyromegaly or lymphadenopathy.  CHEST: Clear to auscultation bilaterally.  CARDIOVASCULAR:  Distant S1 and S2 with no audible murmurs, rubs or  gallops.  PMI is nondisplaced in left midclavicular line.  Peripheral  pulses are 2+ and symmetric bilaterally.  ABDOMEN: Soft, nontender and nondistended.  Positive bowel sounds.  No  hepatosplenomegaly.  EXTREMITIES:  No clubbing, cyanosis or edema.  No evidence of  inflammation or ulceration.  NEUROLOGIC:  Grossly nonfocal.   EKG by my interpretation reveals normal sinus rhythm with diffuse ST  elevation and PR depression in leads I and II.   LABORATORY DATA:  Cardiac biomarkers are negative x2 by point of care  testing.  Sodium 144, potassium 3.7, chloride 104, CO2 is 29, BUN is 10,  creatinine 1.1, glucose 243.  White count 4.19, hemoglobin 13.6,  hematocrit 39.2, platelet count 147.    IMPRESSION:  1. Chest pain suspicious for pericarditis.  2. Polysubstance abuse with recent cocaine use prior to admission.  3. Hypertension.  4. Diabetes.  5. Hyperlipidemia.   PLAN:  Will admit the patient to telemetry.  We will cycle his cardiac  biomarkers to rule out MI.  On review his EKG, there does appear to be  diffuse ST elevation with PR depression in leads I and II.  His pain is  very pleuritic in nature and made worse with deep inspiration and cough.  I have a very low suspicion of an acute coronary syndrome in this  patient.  We will discontinue his unfractionated heparin drip.  I have  initiated colchicine 0.6 mg b.i.d. as well as ibuprofen 600 mg t.i.d..  We will obtain a transthoracic echocardiogram in morning to assess his  left ventricular function and rule out pericardial effusion.      Audery Amel, MD  Electronically Signed     SHG/MEDQ  D:  12/30/2008  T:  12/30/2008  Job:  [id number]

## 2010-12-08 NOTE — Discharge Summary (Signed)
NAMELUCILLE, [name]                   ACCOUNT NO.:  [id number]   MEDICAL RECORD NO.:  [id number]          PATIENT TYPE:  INP   LOCATION:  3707                         FACILITY:  [facility]   PHYSICIAN:  Marca Ancona, MD      DATE OF BIRTH:  1975-10-16   DATE OF ADMISSION:  12/30/2008  DATE OF DISCHARGE:  12/30/2008                               DISCHARGE SUMMARY   PRIMARY CARDIOLOGIST:  (New) Marca Ancona, MD   DISCHARGE DIAGNOSIS:  Noncardiac chest pain.   SECONDARY DIAGNOSES:  1. Polysubstance abuse.  2. Depressive disorder.  3. Diabetes mellitus, type 2.  4. Hyperlipidemia.  5. Hypertension.  6. Asthma.   ALLERGIES:  NKDA.   PROCEDURE PERFORMED DURING THIS HOSPITALIZATION:  1. EKG showing sinus rhythm with diffuse ST elevation and PR      depression in leads I and II, no other acute changes.  2. Chest x-ray showing no acute cardiopulmonary findings.   PROCEDURE:  1. EKG showing normal sinus rhythm at a rate of 60 with early      repolarization versus ST elevation in V3 through V6.  No T-wave      inversion.  No significant Q waves.  Normal axis.  Left ventricular      hypertrophy by voltage criteria, PR 206, QRS 114, QTC 416.  2. An echocardiogram completed December 30, 2008, showing LV wall thickness      increased and had a mild LVH, systolic function was normal, EF in      range of 55-60%, wall motion was normal (no regional wall motion      abnormalities), LV diastolic function parameters normal.  Pulmonary      artery peak pressure 24 mmHg (no pulmonary hypertension).  IVC      measures 1.7 cm with normal respirophasic variation, suggesting RA      pressure 6-10 mmHg.  No significant valvular dysfunction.  No      pericardial effusion noted.  Normal RV size and function.   HISTORY OF PRESENT ILLNESS:  Wayne [name] is a 35 year old African American  male with a past medical history significant for polysubstance abuse,  hypertension, diabetes mellitus, hyperlipidemia, who was  transferred  from [facility] for further eval of his chest pain.  Last  Friday, the patient developed symptoms consistent with an upper  respiratory tract infection.  Subsequently, he developed a very sharp  chest pain, which he described as pleuritic in nature.  States symptoms  are made worse with deep inhalation and cough.  He denies any  association with exertion.  He denies PND, orthopnea, palpitations,  presyncope, or syncope.  He presented to the Urgent Care Facility where  an EKG was obtained and based on the initial interpretation, there was  concern for acute coronary syndrome.  Therefore, he was transferred to  [facility] for further eval.  The patient was hemodynamically  stable, and chest pain free, with the exception of with deep inhalation  or with cough.  The patient reports using intranasal cocaine earlier in  the day (December 29, 2008), and has a long history of polysubstance abuse  per medical records.   HOSPITAL COURSE:  The patient admitted and underwent procedures as  described above.  His vital signs remained stable.  Due to the patient's  highly atypical symptomatology, the best course of action was thought to  be a cycle of cardiac enzymes and check a 2-D echocardiogram along with  another EKG.  All three point care markers were negative along with 2  full sets of cardiac enzymes were also negative.  The patient's 2-D  echocardiogram showed no evidence of a cardiac etiology to the patient's  symptoms.  The patient was started on ibuprofen 600 mg p.o. t.i.d. and  colchicine 0.6 mg p.o. b.i.d. while in the hospital with some relief of  symptoms.  The patient will continue on these medications with  decreasing dose of the colchicine p.o. daily.  The patient will follow  up with his primary cardiologist, Dr. Shirlee [name], in approximately 2 weeks.  The patient has been instructed to be vigilant in terms of his  symptomatology.  Also, he has been educated  as to the importance of  total cessation of all illicit drug use.  At the time of discharge, the  patient will be given his medication list, new prescriptions, as well as  his followup instructions.  All questions or concerns should be  addressed at that time.   DISCHARGE LABORATORIES:  See H&P and hospital course both mine and  anyone else.   FOLLOWUP PLANS AND APPOINTMENTS:  Dr. Shirlee [name] January 24, 2009, at 8 a.m.   DISCHARGE MEDICATIONS:  1. Actos 45 mg p.o. daily.  2. Metformin 500 mg p.o. b.i.d.  3. Glipizide 10 mg p.o. b.i.d.  4. Hydrogel 30 mg p.o. daily.  5. Lisinopril 10 mg p.o. daily.  6. Amlodipine 10 mg p.o. daily.  7. Celexa 10 mg p.o. daily.  8. Zocor 30 mg p.o. daily.  9. Symbicort 1-2 puffs p.r.n.  10.Albuterol 1-2 puffs p.r.n.  11.Omeprazole 20 mg p.o. daily.  12.Ibuprofen 600 mg p.o. t.i.d. x2 weeks.  13.Colchicine 0.6 mg p.o. daily x30 days.   DURATION OF DISCHARGE ENCOUNTER:  Just 35 minutes including physician  time.      Jarrett Ables, [facility]      Marca Ancona, MD  Electronically Signed    MS/MEDQ  D:  12/30/2008  T:  12/31/2008  Job:  [id number]   cc:   Marca Ancona, MD

## 2010-12-08 NOTE — Discharge Summary (Signed)
NAMEREBEKAH, SPRINKLE                   ACCOUNT NO.:  [id number]   MEDICAL RECORD NO.:  [id number]          PATIENT TYPE:  IPS   LOCATION:  0305                          FACILITY:  [facility]   PHYSICIAN:  Jasmine Pang, M.D. DATE OF BIRTH:  September 04, 1975   DATE OF ADMISSION:  12/16/2008  DATE OF DISCHARGE:  12/20/2008                               DISCHARGE SUMMARY   IDENTIFICATION:  This is a 35 year old single African American male who  was admitted on a voluntary basis on Dec 16, 2008.   HISTORY OF PRESENT ILLNESS:  This is the first [facility] admission for this 26-  year-old unemployed male who reports depression with suicidal thoughts  for about 3 days.  He states this was when his friend kicked him out of  the house.  He had no money to give him for rent after staying there for  about a month.  He states he has also been using cocaine with him pretty  steadily for the past month.  Now, he does not want to return to his  parents' home and has nowhere to live.  He has thoughts about possibly  cutting himself.  He denies any history of prior suicide attempts.  He  endorses a history of cocaine abuse fairly steadily since 2008.  He was  previously abstinent 2005-2008 after attending a court ordered treatment  at [facility].  He had been living in his parents' home, but gotten into some  conflict with them.  He is currently homeless.  For further admission  information, see psychiatric admission assessment.  An initial Axis I  disorder was depressive disorder, not otherwise specified and cocaine  abuse (rule out dependence).  On Axis III, he was diagnosed with  diabetes mellitus type 2, uncontrolled.   PHYSICAL FINDINGS:  Physical exam was done in the emergency room and is  noted in the record.  There were no acute physical or medical problems  noted.   LABORATORY DATA:  Remarkable for serum glucose of 283 mg/dL.  Other  electrolytes were within normal limits.  BUN was 9, creatinine 0.95.  A  routine  urinalysis with glucose greater than 1000 mg/dL, ketones 40  mg/dL.  In urine, WBCs 3-6 per high-powered field.  Urine drug screen  was positive for cocaine.  CBC was remarkable for a WBC count of 5.4 and  a hemoglobin of 14.6 and hematocrit of 43.  Platelets were 194,000.   HOSPITAL COURSE:  Upon admission, the patient was started on a glycemic  control.  Orders set as per our hospital protocol.  He was started at a  moderate level.  He was also restarted on his Glucotrol 5 mg p.o. q.a.m.  and metformin 500 mg p.o. b.i.d.  In individual sessions, the patient  was casually dressed with fair eye contact.  Speech was normal rate and  flow.  Psychomotor activity was within normal limits.  The patient was  depressed and had a depressed mood with anxiety.  There was positive  suicidal ideation.  No homicidal ideation.  There was no evidence of a  thought disorder or psychosis, and the patient talked about his recent  difficulties, I have been going through a rough time.  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  Stressors  include his family issues, he had been kicked out of the place that he  has been living recently and having no place to go.  He stated he had  been using alcohol, marijuana, and cocaine.  He stated he felt like his  family judges him and looks down on me.  He has not been taking his  diabetes medications as prescribed.  His diabetes was managed by our  nurse practitioner while on the unit and he did require p.r.n. insulin  as per glycemic control protocol.  On Dec 18, 2008, he was started on  Celexa 20 mg p.o. q.a.m.  He talked about wanting to go to an [address] and making calls to finalize his discharge plans.  He later  decided to go to [facility] in [place], they will transport the  patient to inform all his Mental Health appointments and employment  appointment, and this will give him a better start in his recovery.  On   Dec 20, 2008, sleep was good, appetite was good.  Mood was less  depressed, less anxious.  Affect was consistent with mood.  There was no  suicidal or homicidal ideation.  No thoughts of self-injurious behavior.  No auditory or visual hallucinations.  No paranoia or delusions.  Thoughts were logical and goal directed.  Thought content, no  predominant theme.  Cognitive was grossly intact.  Insight good,  judgment good.  Impulse control was good.  He was felt to be safe for  discharge and was looking forward to the transition to the [employer].   DISCHARGE DIAGNOSES:  Axis I:  Depressive disorder, not otherwise  specified.  Polysubstance abuse.  Axis II:  None.  Axis III:  Diabetes mellitus type 2.  Axis IV:  Severe (issues with homelessness and no income, occupational  problem, burden of psychiatric illness, and burden of chemical abuse  illness as well as burden of diabetes).  Axis V:  Global assessment of functioning was 55 upon discharge.  GAF  was 48 upon admission.  GAF highest past year was 60-65.   DISCHARGE PLANS:  There were no specific activity level prescriptions,  he needed to be on a diabetic diet for his diabetes.   POSTHOSPITAL CARE PLANS:  The patient will go to [facility] in  [place] on Dec 20, 2008, the day of discharge.  [facility] [place] [facility] followup medication management.   DISCHARGE MEDICATIONS:  1. Celexa 20 mg daily.  2. Metformin 500 mg 1 pill twice a day.  3. Glucotrol 5 mg daily.   He is also to see Dr. Oliver Barre within 2 weeks for medical problems and  further prescriptions.      Jasmine Pang, M.D.  Electronically Signed     BHS/MEDQ  D:  12/20/2008  T:  12/21/2008  Job:  [id number]

## 2010-12-08 NOTE — Assessment & Plan Note (Signed)
[facility]                                 ON-CALL NOTE   NAME:Wayne [name], Wayne [name]                          MRN:          [id number]  DATE:06/08/2009                            DOB:          08-Dec-1975    PRIMARY CARE Raigan Baria:  Dr. Oliver Barre.   The patient called in having some rectal bleeding, this has happened  twice now with bowel movements.  He has had significant amount of blood  going into the toilet and on his paper.  Currently, he is asymptomatic  and not lightheaded, he does complain of some mild nausea.  Not having  any significant pain with bowel movement.  I explained to him that most  commonly in a 35 year old person, these symptoms would be related to  hemorrhoids or anal fissures, most probably not emergent; however,  further followup is indicated for gross assessment and followup  laboratories.  He has to go to school during the week this week, and he  is going to go to Urgent Care today for evaluation.     Juleen China, MD    STC/MedQ  DD: 06/08/2009  DT: 06/09/2009  Job #: [id number]

## 2010-12-08 NOTE — H&P (Signed)
Wayne [name], Wayne [name]                   ACCOUNT NO.:  [id number]   MEDICAL RECORD NO.:  [id number]          PATIENT TYPE:  IPS   LOCATION:  0305                          FACILITY:  [facility]   PHYSICIAN:  Jasmine Pang, M.D. DATE OF BIRTH:  1975-09-02   DATE OF ADMISSION:  12/16/2008  DATE OF DISCHARGE:                       PSYCHIATRIC ADMISSION ASSESSMENT   TIME:  9:40 a.m.   IDENTIFICATION:  A 35 year old male.  This is a voluntary admission.   HISTORY OF PRESENT ILLNESS:  First [facility] admission for this 35 year old  unemployed male who reports depression with suicidal thoughts for about  3 days since his friend kicked him out of the house.  He had no money to  give him for rent after staying there for about a month, had also been  using cocaine with him pretty steadily for the past month.  Now he does  not want to return to his parents home and has nowhere to live.  Has  thoughts about possibly cutting himself.  He denies any history of prior  suicide attempts.  He endorses a history of cocaine abuse fairly  steadily since 2008.  He was previously abstinent 2005-2008 after  attending court ordered treatment at [facility] in 2007 for a 2005  paraphernalia charge.  He had been living in his parents home, but had  gotten into some conflict with them.  Currently homeless.   PAST PSYCHIATRIC HISTORY:  No history of suicide attempts.  Has had  prior treatment at [facility].  This is his first inpatient hospitalization.  History of cannabis abuse since age 24.  Denies any other current  substance abuse.  History of cocaine abuse as noted above.  No history  of seizures or learning disability.   SOCIAL HISTORY:  No current legal charges.  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  He is single, never  married.  Left home 1 month ago after conflict with the parents.  Has  been living at a friend's house,  now currently homeless.  No income.   FAMILY HISTORY:  He denies a family history of mental illness or  substance abuse.   MEDICAL HISTORY:  Primary care practitioner is Dr. Oliver Barre, who he is  not seen in more than a year.  Medical problems are diabetes mellitus  type 2.   MEDICATIONS:  None.  He has had no medications since March stating that  he cannot afford them, although he was previously on multiple oral  diabetic agents.  Also previously on Celexa 20 mg daily.   DRUG ALLERGIES:  NONE.   PHYSICAL EXAMINATION:  GENERAL:  Physical exam done in the emergency  room as noted in the record.  This is a healthy-appearing African  American male, medium build in no distress.  VITAL SIGNS:  Normal.   LABORATORY DATA:  Remarkable for a serum glucose of 283 mg/dL.  Other  electrolytes within normal limits.  BUN 9, creatinine 0.95.  Routine  urinalysis with glucose greater  than 1000 mg/dL, ketones 40 mg/dL and  urine WBCs 3-6 per high-powered field.  Urine drug screen was positive  for cocaine.  CBC:  WBC 5.4, hemoglobin 14.6, hematocrit 43.0, platelets  194,000.   MENTAL STATUS EXAM:  Fully alert male.  Flattened affect.  Affect and  mood mildly anxious.  Speech is normal.  Gives a coherent history.  Anxious and concerned about the fact that he is homeless, now has  nowhere to go.  Having to choose between the shelter and possibly  reconciling with his parents.  Thought processes logical and coherent.  No suicidal intent today.  No homicidal thoughts.  No evidence of  psychosis.  No signs of internal distraction.  Asking for help with his  situation.  Cognition fully preserved.  Memory is intact.  Insight  limited.   AXIS I:  Depressive disorder not otherwise specified.  Cocaine abuse  rule out dependence.  AXIS II:  No diagnosis.  AXIS III:  Diabetes mellitus type 2 uncontrolled.  AXIS IV:  Severe issues with homelessness and no income.  AXIS V:  Current 48, past year not  known.   PLAN:  The plan is to voluntarily admit him to our dual diagnosis unit.  We are going to hold off on psychotropic medications at this point other  than trazodone 50 mg h.s. p.r.n. insomnia.  He has denied any other  substance abuse than the cocaine.  We will restart his metformin,  glipizide and place him on a sliding scale for elevated CBGs.  We are  checking CBGs q.i.d.  If he desires, we will refer him for followup to a  primary care physician.  Meanwhile, we will consider the need for  antidepressant therapy.      Margaret A. Lorin Picket, N.P.      Jasmine Pang, M.D.  Electronically Signed    MAS/MEDQ  D:  12/17/2008  T:  12/17/2008  Job:  [id number]

## 2011-01-01 ENCOUNTER — Other Ambulatory Visit: Payer: PRIVATE HEALTH INSURANCE

## 2011-01-27 ENCOUNTER — Emergency Department (HOSPITAL_BASED_OUTPATIENT_CLINIC_OR_DEPARTMENT_OTHER)
Admission: EM | Admit: 2011-01-27 | Discharge: 2011-01-27 | Disposition: A | Discharge status: Home / Self Care | Payer: PRIVATE HEALTH INSURANCE | Attending: Emergency Medicine | Admitting: Emergency Medicine

## 2011-01-27 ENCOUNTER — Emergency Department (INDEPENDENT_AMBULATORY_CARE_PROVIDER_SITE_OTHER): Payer: PRIVATE HEALTH INSURANCE

## 2011-01-27 DIAGNOSIS — E785 Hyperlipidemia, unspecified: Secondary | ICD-10-CM | POA: Insufficient documentation

## 2011-01-27 DIAGNOSIS — W2209XA Striking against other stationary object, initial encounter: Secondary | ICD-10-CM | POA: Insufficient documentation

## 2011-01-27 DIAGNOSIS — X58XXXA Exposure to other specified factors, initial encounter: Secondary | ICD-10-CM

## 2011-01-27 DIAGNOSIS — S6390XA Sprain of unspecified part of unspecified wrist and hand, initial encounter: Secondary | ICD-10-CM | POA: Insufficient documentation

## 2011-01-27 DIAGNOSIS — E119 Type 2 diabetes mellitus without complications: Secondary | ICD-10-CM | POA: Insufficient documentation

## 2011-01-27 DIAGNOSIS — I10 Essential (primary) hypertension: Secondary | ICD-10-CM | POA: Insufficient documentation

## 2011-01-27 DIAGNOSIS — Y9289 Other specified places as the place of occurrence of the external cause: Secondary | ICD-10-CM | POA: Insufficient documentation

## 2011-01-27 DIAGNOSIS — M79609 Pain in unspecified limb: Secondary | ICD-10-CM

## 2011-01-27 DIAGNOSIS — J45909 Unspecified asthma, uncomplicated: Secondary | ICD-10-CM | POA: Insufficient documentation

## 2011-01-27 LAB — GLUCOSE, CAPILLARY

## 2011-02-01 ENCOUNTER — Encounter: Payer: Self-pay | Admitting: Internal Medicine

## 2011-02-01 ENCOUNTER — Other Ambulatory Visit (INDEPENDENT_AMBULATORY_CARE_PROVIDER_SITE_OTHER): Payer: PRIVATE HEALTH INSURANCE

## 2011-02-01 ENCOUNTER — Ambulatory Visit (INDEPENDENT_AMBULATORY_CARE_PROVIDER_SITE_OTHER): Payer: PRIVATE HEALTH INSURANCE | Admitting: Internal Medicine

## 2011-02-01 VITALS — BP 100/72 | HR 84 | Temp 98.6°F | Ht 73.0 in | Wt 188.2 lb

## 2011-02-01 DIAGNOSIS — E785 Hyperlipidemia, unspecified: Secondary | ICD-10-CM

## 2011-02-01 DIAGNOSIS — E119 Type 2 diabetes mellitus without complications: Secondary | ICD-10-CM

## 2011-02-01 DIAGNOSIS — M79609 Pain in unspecified limb: Secondary | ICD-10-CM

## 2011-02-01 DIAGNOSIS — G629 Polyneuropathy, unspecified: Secondary | ICD-10-CM

## 2011-02-01 DIAGNOSIS — Z0000 Encounter for general adult medical examination without abnormal findings: Secondary | ICD-10-CM

## 2011-02-01 DIAGNOSIS — M79602 Pain in left arm: Secondary | ICD-10-CM

## 2011-02-01 DIAGNOSIS — M79671 Pain in right foot: Secondary | ICD-10-CM

## 2011-02-01 DIAGNOSIS — J029 Acute pharyngitis, unspecified: Secondary | ICD-10-CM | POA: Insufficient documentation

## 2011-02-01 HISTORY — DX: Polyneuropathy, unspecified: G62.9

## 2011-02-01 LAB — BASIC METABOLIC PANEL
BUN: 12 mg/dL (ref 6–23)
CO2: 28 mEq/L (ref 19–32)
Chloride: 98 mEq/L (ref 96–112)
Glucose, Bld: 421 mg/dL — ABNORMAL HIGH (ref 70–99)
Potassium: 4 mEq/L (ref 3.5–5.1)
Sodium: 138 mEq/L (ref 135–145)

## 2011-02-01 LAB — LIPID PANEL
Cholesterol: 189 mg/dL (ref 0–200)
VLDL: 19 mg/dL (ref 0.0–40.0)

## 2011-02-01 LAB — HEMOGLOBIN A1C: Hgb A1c MFr Bld: 16.2 % — ABNORMAL HIGH (ref 4.6–6.5)

## 2011-02-01 MED ORDER — INSULIN GLARGINE 100 UNIT/ML ~~LOC~~ SOLN: 50.0000 [IU] | Freq: Every day | SUBCUTANEOUS | Status: DC

## 2011-02-01 MED ORDER — INSULIN LISPRO 100 UNIT/ML ~~LOC~~ SOLN: 4.0000 [IU] | Freq: Three times a day (TID) | SUBCUTANEOUS | Status: DC

## 2011-02-01 MED ORDER — ATORVASTATIN CALCIUM 10 MG PO TABS: 10.0000 mg | ORAL_TABLET | Freq: Every day | ORAL | Status: DC

## 2011-02-01 MED ORDER — PREDNISONE 20 MG PO TABS: 20.0000 mg | ORAL_TABLET | Freq: Every day | ORAL | Status: AC

## 2011-02-01 MED ORDER — SERTRALINE HCL 100 MG PO TABS: 100.0000 mg | ORAL_TABLET | Freq: Every day | ORAL | Status: DC

## 2011-02-01 MED ORDER — AZITHROMYCIN 250 MG PO TABS: ORAL_TABLET | ORAL | Status: AC
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
## 2011-02-01 NOTE — Assessment & Plan Note (Signed)
Mild to mod, for antibx course,  to f/u any worsening symptoms or concerns 

## 2011-02-01 NOTE — Patient Instructions (Addendum)
The zoloft and lipitor were sent to your pharmacy Please see Latoya with the lantus and humalog prescriptions to see about the Patient Assist Programs Take all new medications as prescribed - the antibiotic, and the prednisone You will be contacted regarding the referral for: podiatry - [facility] Please go to LAB in the Basement for the blood and/or urine tests to be done today Please call the phone number [phone] (the PhoneTree System) for results of testing in 2-3 days;  When calling, simply dial the number, and when prompted enter the MRN number above (the Medical Record Number) and the # key, then the message should start. Please return in 6 mo with Lab testing done 3-5 days before (OK TO CANCEL the Sept 2012 visit since you are here today)

## 2011-02-01 NOTE — Assessment & Plan Note (Signed)
Marked contusion, with symtpoms neuritis - for low dose predpack ,  to f/u any worsening symptoms or concerns

## 2011-02-01 NOTE — Assessment & Plan Note (Signed)
Urged compliacne with med, re-sent escript

## 2011-02-01 NOTE — Assessment & Plan Note (Signed)
Control unclear, Continue all other medications as before but check labs today, compliacne is major issue  Lab Results  Component Value Date   HGBA1C 16.2* 02/01/2011

## 2011-02-05 DIAGNOSIS — Z0279 Encounter for issue of other medical certificate: Secondary | ICD-10-CM

## 2011-02-08 ENCOUNTER — Telehealth: Payer: Self-pay | Admitting: *Deleted

## 2011-02-08 NOTE — Telephone Encounter (Signed)
His type of pain can last for several weeks with gradual decrease of swelling and stiffness as well  Paperwork was completed last Friday - sent to Wayne [name]

## 2011-02-08 NOTE — Telephone Encounter (Signed)
Pt informed

## 2011-02-08 NOTE — Telephone Encounter (Signed)
Patient c/o of still having pain in his arm and states that he believes he has a "hairline fracture in wrist & hand".? Pt also inquiring as to the status of his Disability paperwork.

## 2011-02-09 ENCOUNTER — Telehealth: Payer: Self-pay | Admitting: *Deleted

## 2011-02-09 DIAGNOSIS — M79603 Pain in arm, unspecified: Secondary | ICD-10-CM

## 2011-02-09 NOTE — Telephone Encounter (Signed)
Done per emr 

## 2011-02-09 NOTE — Telephone Encounter (Signed)
Pt states he was seen for an arm injury. He states his arm is still in quite a bit of pain and he would like to know if MD wanted to refer him to an orhtopedic. He states he also needs a work note because his arm is keeping him from working. Please Advise

## 2011-02-10 NOTE — Telephone Encounter (Signed)
Left message for pt to callback office.  

## 2011-02-12 ENCOUNTER — Telehealth: Payer: Self-pay | Admitting: *Deleted

## 2011-02-12 NOTE — Telephone Encounter (Signed)
Called pt again still no answer LMOM need to make f/u appt to be evaluated.Wayne KitchenMarland Kitchen7/20/12@3 :03pm/LMB

## 2011-02-12 NOTE — Telephone Encounter (Signed)
LMOM at mobile & home phone numbers to inform Pt that Lantus approval had been received & we should have his insulin at our office in approximately 2-3 business days.

## 2011-02-18 ENCOUNTER — Telehealth: Payer: Self-pay

## 2011-02-18 NOTE — Telephone Encounter (Signed)
Called the patient informed his mother that the insulin we ordered through patient assistance has arrived. The patient should pickup at his convenience asap.

## 2011-02-20 ENCOUNTER — Emergency Department (HOSPITAL_COMMUNITY)
Admission: EM | Admit: 2011-02-20 | Discharge: 2011-02-20 | Disposition: A | Discharge status: Home / Self Care | Payer: PRIVATE HEALTH INSURANCE | Attending: Emergency Medicine | Admitting: Emergency Medicine

## 2011-02-20 DIAGNOSIS — F17200 Nicotine dependence, unspecified, uncomplicated: Secondary | ICD-10-CM | POA: Insufficient documentation

## 2011-02-20 DIAGNOSIS — S335XXA Sprain of ligaments of lumbar spine, initial encounter: Secondary | ICD-10-CM | POA: Insufficient documentation

## 2011-02-20 DIAGNOSIS — F329 Major depressive disorder, single episode, unspecified: Secondary | ICD-10-CM | POA: Insufficient documentation

## 2011-02-20 DIAGNOSIS — I10 Essential (primary) hypertension: Secondary | ICD-10-CM | POA: Insufficient documentation

## 2011-02-20 DIAGNOSIS — R350 Frequency of micturition: Secondary | ICD-10-CM | POA: Insufficient documentation

## 2011-02-20 DIAGNOSIS — Z794 Long term (current) use of insulin: Secondary | ICD-10-CM | POA: Insufficient documentation

## 2011-02-20 DIAGNOSIS — E119 Type 2 diabetes mellitus without complications: Secondary | ICD-10-CM | POA: Insufficient documentation

## 2011-02-20 DIAGNOSIS — R631 Polydipsia: Secondary | ICD-10-CM | POA: Insufficient documentation

## 2011-02-20 DIAGNOSIS — X500XXA Overexertion from strenuous movement or load, initial encounter: Secondary | ICD-10-CM | POA: Insufficient documentation

## 2011-02-20 DIAGNOSIS — R300 Dysuria: Secondary | ICD-10-CM | POA: Insufficient documentation

## 2011-02-20 DIAGNOSIS — M545 Low back pain, unspecified: Secondary | ICD-10-CM | POA: Insufficient documentation

## 2011-02-20 DIAGNOSIS — E785 Hyperlipidemia, unspecified: Secondary | ICD-10-CM | POA: Insufficient documentation

## 2011-02-20 DIAGNOSIS — J45909 Unspecified asthma, uncomplicated: Secondary | ICD-10-CM | POA: Insufficient documentation

## 2011-02-20 DIAGNOSIS — F3289 Other specified depressive episodes: Secondary | ICD-10-CM | POA: Insufficient documentation

## 2011-02-20 LAB — POCT I-STAT, CHEM 8
BUN: 11 mg/dL (ref 6–23)
Calcium, Ion: 1.24 mmol/L (ref 1.12–1.32)
Chloride: 100 mEq/L (ref 96–112)
Sodium: 139 mEq/L (ref 135–145)

## 2011-02-20 LAB — URINALYSIS, ROUTINE W REFLEX MICROSCOPIC
Bilirubin Urine: NEGATIVE
Glucose, UA: >1000 mg/dL — AB
Ketones, ur: 15 mg/dL — AB
Nitrite: NEGATIVE
Specific Gravity, Urine: 1.044 — ABNORMAL HIGH (ref 1.005–1.030)
pH: 5.5 (ref 5.0–8.0)

## 2011-02-20 LAB — URINE MICROSCOPIC-ADD ON

## 2011-02-20 LAB — GLUCOSE, CAPILLARY: Glucose-Capillary: 325 mg/dL — ABNORMAL HIGH (ref 70–99)

## 2011-02-21 LAB — URINE CULTURE
Colony Count: NO GROWTH
Culture  Setup Time: 201207290156

## 2011-02-26 ENCOUNTER — Emergency Department (HOSPITAL_COMMUNITY): Payer: PRIVATE HEALTH INSURANCE

## 2011-02-26 ENCOUNTER — Inpatient Hospital Stay (HOSPITAL_COMMUNITY)
Admission: EM | Admit: 2011-02-26 | Discharge: 2011-02-27 | DRG: 918 | Disposition: A | Discharge status: Other Acute Inpatient Hospital | Payer: PRIVATE HEALTH INSURANCE | Attending: Internal Medicine | Admitting: Internal Medicine

## 2011-02-26 ENCOUNTER — Encounter (HOSPITAL_COMMUNITY): Payer: Self-pay

## 2011-02-26 DIAGNOSIS — E785 Hyperlipidemia, unspecified: Secondary | ICD-10-CM | POA: Diagnosis present

## 2011-02-26 DIAGNOSIS — Y92009 Unspecified place in unspecified non-institutional (private) residence as the place of occurrence of the external cause: Secondary | ICD-10-CM

## 2011-02-26 DIAGNOSIS — G4733 Obstructive sleep apnea (adult) (pediatric): Secondary | ICD-10-CM | POA: Diagnosis present

## 2011-02-26 DIAGNOSIS — IMO0001 Reserved for inherently not codable concepts without codable children: Secondary | ICD-10-CM | POA: Diagnosis present

## 2011-02-26 DIAGNOSIS — F17200 Nicotine dependence, unspecified, uncomplicated: Secondary | ICD-10-CM | POA: Diagnosis present

## 2011-02-26 DIAGNOSIS — F1410 Cocaine abuse, uncomplicated: Secondary | ICD-10-CM | POA: Diagnosis present

## 2011-02-26 DIAGNOSIS — F39 Unspecified mood [affective] disorder: Secondary | ICD-10-CM

## 2011-02-26 DIAGNOSIS — T50901A Poisoning by unspecified drugs, medicaments and biological substances, accidental (unintentional), initial encounter: Principal | ICD-10-CM | POA: Diagnosis present

## 2011-02-26 DIAGNOSIS — F3289 Other specified depressive episodes: Secondary | ICD-10-CM | POA: Diagnosis present

## 2011-02-26 DIAGNOSIS — T50902A Poisoning by unspecified drugs, medicaments and biological substances, intentional self-harm, initial encounter: Secondary | ICD-10-CM | POA: Diagnosis present

## 2011-02-26 DIAGNOSIS — F329 Major depressive disorder, single episode, unspecified: Secondary | ICD-10-CM | POA: Diagnosis present

## 2011-02-26 LAB — HEMOGLOBIN A1C
Hgb A1c MFr Bld: 15.2 % — ABNORMAL HIGH (ref <5.7)
Mean Plasma Glucose: 390 mg/dL — ABNORMAL HIGH (ref <117)

## 2011-02-26 LAB — CBC
HCT: 39.3 % (ref 39.0–52.0)
MCHC: 34.6 g/dL (ref 30.0–36.0)
MCV: 89.3 fL (ref 78.0–100.0)
RDW: 12.5 % (ref 11.5–15.5)

## 2011-02-26 LAB — URINALYSIS, ROUTINE W REFLEX MICROSCOPIC
Glucose, UA: >1000 mg/dL — AB
Ketones, ur: NEGATIVE mg/dL
Leukocytes, UA: NEGATIVE
Specific Gravity, Urine: 1.04 — ABNORMAL HIGH (ref 1.005–1.030)
pH: 6.5 (ref 5.0–8.0)

## 2011-02-26 LAB — COMPREHENSIVE METABOLIC PANEL
ALT: 28 U/L (ref 0–53)
ALT: 26 U/L (ref 0–53)
AST: 21 U/L (ref 0–37)
Albumin: 3.7 g/dL (ref 3.5–5.2)
Alkaline Phosphatase: 159 U/L — ABNORMAL HIGH (ref 39–117)
Alkaline Phosphatase: 125 U/L — ABNORMAL HIGH (ref 39–117)
BUN: 11 mg/dL (ref 6–23)
CO2: 27 mEq/L (ref 19–32)
Chloride: 104 mEq/L (ref 96–112)
GFR calc Af Amer: >60 mL/min (ref >60)
GFR calc Af Amer: >60 mL/min (ref >60)
Glucose, Bld: 503 mg/dL — ABNORMAL HIGH (ref 70–99)
Glucose, Bld: 366 mg/dL — ABNORMAL HIGH (ref 70–99)
Potassium: 3.9 mEq/L (ref 3.5–5.1)
Potassium: 3.8 mEq/L (ref 3.5–5.1)
Sodium: 134 mEq/L — ABNORMAL LOW (ref 135–145)
Sodium: 139 mEq/L (ref 135–145)
Total Bilirubin: 0.5 mg/dL (ref 0.3–1.2)
Total Protein: 7.2 g/dL (ref 6.0–8.3)

## 2011-02-26 LAB — GLUCOSE, CAPILLARY
Glucose-Capillary: 390 mg/dL — ABNORMAL HIGH (ref 70–99)
Glucose-Capillary: 417 mg/dL — ABNORMAL HIGH (ref 70–99)
Glucose-Capillary: 332 mg/dL — ABNORMAL HIGH (ref 70–99)

## 2011-02-26 LAB — BLOOD GAS, ARTERIAL
Acid-Base Excess: 1.5 mmol/L (ref 0.0–2.0)
Acid-base deficit: 0.5 mmol/L (ref 0.0–2.0)
Bicarbonate: 25.6 mEq/L — ABNORMAL HIGH (ref 20.0–24.0)
FIO2: 0.21 %
O2 Saturation: 98.1 %
Patient temperature: 98.6
TCO2: 22.4 mmol/L (ref 0–100)
pCO2 arterial: 46.1 mmHg — ABNORMAL HIGH (ref 35.0–45.0)
pCO2 arterial: 40.8 mmHg (ref 35.0–45.0)
pO2, Arterial: 94.9 mmHg (ref 80.0–100.0)

## 2011-02-26 LAB — DIFFERENTIAL
Eosinophils Relative: 1 % (ref 0–5)
Lymphocytes Relative: 31 % (ref 12–46)
Lymphs Abs: 1.6 10*3/uL (ref 0.7–4.0)
Monocytes Absolute: 0.5 10*3/uL (ref 0.1–1.0)

## 2011-02-26 LAB — ACETAMINOPHEN LEVEL: Acetaminophen (Tylenol), Serum: <15 ug/mL (ref 10–30)

## 2011-02-26 LAB — RAPID URINE DRUG SCREEN, HOSP PERFORMED: Opiates: NOT DETECTED

## 2011-02-26 NOTE — H&P (Signed)
NAMEJARRIS, Wayne [name]                   ACCOUNT NO.:  [id number]  MEDICAL RECORD NO.:  [id number]  LOCATION:  WLED                         FACILITY:  [facility]  PHYSICIAN:  Gery Pray, MD      DATE OF BIRTH:  03/01/1976  DATE OF ADMISSION:  02/26/2011 DATE OF DISCHARGE:                             HISTORY & PHYSICAL   PRIMARY CARE PHYSICIAN:  Unknown.  CODE STATUS:  Full code.  The patient goes to team #2.  This is a 35 year old male who apparently attempted to overdose and commit suicide.  It remains unclear exactly what he took but apparently he told his sister that he tried to kill himself.  She called the police.  They took him to [facility] where he was left in the hallway.  He was later found nonresponsive and transported over to [facility].  Here while in the ER, the patient remains somnolent. He is arousable.  He is not able to provide a good history.  He does say that he did take NyQuil which is probably what is responsible for his increased somnolence.  He took some metformin; however, he says perhaps 2-3 tablets.  He took some insulin, and he took some Tylenol.  It is unclear at what time he took any of these medications.  History obtained mainly from the ER physician.  As stated, the patient remains quite somnolent, family not at bedside.  PAST MEDICAL HISTORY:  Includes diabetes mellitus.  PAST SURGICAL HISTORY:  Includes knee surgery.  SOCIAL HISTORY:  Positive for tobacco.  Negative for alcohol or illicit drugs.  FAMILY HISTORY:  Significant for diabetes mellitus.  MEDICATIONS:  Currently unable to obtain.  REVIEW OF SYSTEMS:  Unable to obtain secondary to patient's somnolence and reluctance.  PHYSICAL EXAMINATION:  VITAL SIGNS:  Blood pressure 114/75, pulse 73, respirations 19, temperature 97.8, saturating 97% on room air. GENERAL:  Arousable, pleasant young male. EYES:  PERRLA.  Pink conjunctivae. ENT:  Moist mucosa.  Trachea  midline. NECK:  Supple.  No JVD.  No carotid bruits. LUNGS:  Clear to auscultation bilaterally.  No use of accessory muscles. CARDIOVASCULAR:  Regular rate and rhythm without murmurs, rubs or gallops. ABDOMEN:  Soft.  Positive bowel sounds.  Nontender, nondistended.  No organomegaly. MUSCULOSKELETAL:  The patient does appear to move all extremities equally. NEUROLOGIC:  Unable to assess. SKIN:  No rashes.  No subcutaneous crepitation.  LABORATORY DATA:  Glucose 519, white blood count 5.4, hemoglobin 13.9, platelets 164.  Alcohol level less than 11.  UA, urine is cloudy; however no nitrites, no leukocyte esterase.  Tylenol level less than 15. Salicylate level less than 2.  ABG on room air, pH of 7.35, pCO2 of 46, pO2 of 60, bicarbonate 24.9, repeat glucose now 390.  CT head is negative for any acute issues.  Chest x-ray shows hypoventilatory lungs with bibasilar atelectasis.  EKG shows normal sinus rhythm with ST-segment changes.  ASSESSMENT AND PLAN: 1. Suicide attempt. 2. Overdose, unclear what medication.  NyQuil is one of the     medications.  The patient will be admitted to the ICU because of     his somnolence.  One-to-one sitter will be ordered.  We will go     ahead and add UDS to patient's labs.  We will go ahead also and     repeat a Tylenol level as patient has been here for hours.     However, as stated, it is unclear what or at what time the patient     took medications.  There is a question of NyQuil, Tylenol,     metformin and some insulin.  The patient does admit that he did try     to hurt himself.  He does deny alcohol and drug use. 3. Diabetes mellitus, uncontrolled.  I am unclear as to what     medications the patient is on.  Therefore, we will place patient on     ADA diet and start patient on insulin 70/30 b.i.d.  This patient     goes to team #2.          ______________________________ Gery Pray, MD     DC/MEDQ  D:  02/26/2011  T:  02/26/2011   Job:  [id number]  Electronically Signed by Gery Pray MD on 02/26/2011 11:20:10 PM

## 2011-02-27 ENCOUNTER — Inpatient Hospital Stay (HOSPITAL_COMMUNITY)
Admission: RE | Admit: 2011-02-27 | Discharge: 2011-03-05 | DRG: 885 | Disposition: A | Discharge status: Home / Self Care | Payer: PRIVATE HEALTH INSURANCE | Source: Ambulatory Visit | Attending: Psychiatry | Admitting: Psychiatry

## 2011-02-27 DIAGNOSIS — T50901A Poisoning by unspecified drugs, medicaments and biological substances, accidental (unintentional), initial encounter: Secondary | ICD-10-CM

## 2011-02-27 DIAGNOSIS — F1410 Cocaine abuse, uncomplicated: Secondary | ICD-10-CM

## 2011-02-27 DIAGNOSIS — F333 Major depressive disorder, recurrent, severe with psychotic symptoms: Secondary | ICD-10-CM

## 2011-02-27 DIAGNOSIS — F329 Major depressive disorder, single episode, unspecified: Principal | ICD-10-CM

## 2011-02-27 DIAGNOSIS — F6089 Other specific personality disorders: Secondary | ICD-10-CM

## 2011-02-27 DIAGNOSIS — Z87891 Personal history of nicotine dependence: Secondary | ICD-10-CM

## 2011-02-27 DIAGNOSIS — IMO0001 Reserved for inherently not codable concepts without codable children: Secondary | ICD-10-CM

## 2011-02-27 DIAGNOSIS — Z794 Long term (current) use of insulin: Secondary | ICD-10-CM

## 2011-02-27 DIAGNOSIS — T50902A Poisoning by unspecified drugs, medicaments and biological substances, intentional self-harm, initial encounter: Secondary | ICD-10-CM

## 2011-02-27 DIAGNOSIS — Z560 Unemployment, unspecified: Secondary | ICD-10-CM

## 2011-02-27 DIAGNOSIS — Z7982 Long term (current) use of aspirin: Secondary | ICD-10-CM

## 2011-02-27 DIAGNOSIS — G4733 Obstructive sleep apnea (adult) (pediatric): Secondary | ICD-10-CM

## 2011-02-27 DIAGNOSIS — E785 Hyperlipidemia, unspecified: Secondary | ICD-10-CM

## 2011-02-27 DIAGNOSIS — J45909 Unspecified asthma, uncomplicated: Secondary | ICD-10-CM

## 2011-02-27 DIAGNOSIS — K219 Gastro-esophageal reflux disease without esophagitis: Secondary | ICD-10-CM

## 2011-02-27 LAB — BASIC METABOLIC PANEL
Chloride: 107 mEq/L (ref 96–112)
Creatinine, Ser: 0.97 mg/dL (ref 0.50–1.35)
GFR calc Af Amer: >60 mL/min (ref >60)
GFR calc non Af Amer: >60 mL/min (ref >60)

## 2011-02-27 LAB — CBC
HCT: 35.9 % — ABNORMAL LOW (ref 39.0–52.0)
Hemoglobin: 12.2 g/dL — ABNORMAL LOW (ref 13.0–17.0)
MCHC: 34 g/dL (ref 30.0–36.0)
MCV: 90.4 fL (ref 78.0–100.0)
Platelets: 146 10*3/uL — ABNORMAL LOW (ref 150–400)
RBC: 3.97 MIL/uL — ABNORMAL LOW (ref 4.22–5.81)
RDW: 12.7 % (ref 11.5–15.5)

## 2011-02-27 LAB — GLUCOSE, CAPILLARY
Glucose-Capillary: 276 mg/dL — ABNORMAL HIGH (ref 70–99)
Glucose-Capillary: 313 mg/dL — ABNORMAL HIGH (ref 70–99)

## 2011-02-28 LAB — GLUCOSE, CAPILLARY
Glucose-Capillary: 169 mg/dL — ABNORMAL HIGH (ref 70–99)
Glucose-Capillary: 370 mg/dL — ABNORMAL HIGH (ref 70–99)
Glucose-Capillary: 318 mg/dL — ABNORMAL HIGH (ref 70–99)

## 2011-03-01 LAB — GLUCOSE, CAPILLARY
Glucose-Capillary: 199 mg/dL — ABNORMAL HIGH (ref 70–99)
Glucose-Capillary: 193 mg/dL — ABNORMAL HIGH (ref 70–99)
Glucose-Capillary: 234 mg/dL — ABNORMAL HIGH (ref 70–99)
Glucose-Capillary: 260 mg/dL — ABNORMAL HIGH (ref 70–99)

## 2011-03-02 LAB — GLUCOSE, CAPILLARY
Glucose-Capillary: 186 mg/dL — ABNORMAL HIGH (ref 70–99)
Glucose-Capillary: 218 mg/dL — ABNORMAL HIGH (ref 70–99)

## 2011-03-02 NOTE — Discharge Summary (Signed)
Wayne [name], Wayne [name]                   ACCOUNT NO.:  [id number]  MEDICAL RECORD NO.:  [id number]  LOCATION:  1237                         FACILITY:  [facility]  PHYSICIAN:  Hillery Aldo, M.D.   DATE OF BIRTH:  11/06/1975  DATE OF ADMISSION:  02/26/2011 DATE OF DISCHARGE:  02/27/2011                        DISCHARGE SUMMARY - REFERRING   PRIMARY CARE PHYSICIAN:  Corwin Levins, M.D. at [facility].  DISCHARGE DIAGNOSES: 1. Intentional overdose of uncertain substances. 2. Uncontrolled type 2 diabetes. 3. Depression with suicidal ideation. 4. Cocaine abuse. 5. Obstructive sleep apnea, on nocturnal BiPAP. 6. History of asthma. 7. Dyslipidemia. 8. Tobacco abuse.  DISCHARGE MEDICATIONS: 1. Lantus 45 units subcutaneously q.h.s. 2. Sliding scale insulin, insulin resistant scale, q.a.c. and h.s. 3. NovoLog 8 units subcutaneously q.a.c. 4. Glucosamine 500 mg p.o. daily. 5. Symbicort 160/4.5 mcg 2 puffs b.i.d. p.r.n. 6. Metformin 500 mg p.o. b.i.d.7. Glipizide 10 mg p.o. daily. 8. Multivitamin 1 tablet p.o. daily. 9. Vitamin D 1000 units p.o. daily. 10.Sertraline 100 mg p.o. daily. 11.Nexium 40 mg p.o. daily. 12.Lisinopril 10 mg p.o. daily. 13.Lipitor 10 mg p.o. daily. 14.Ibuprofen 800 mg p.o. q.8 h. p.r.n. 15.Fish oil 1000 mg p.o. daily. 16.Enteric-coated aspirin 81 mg p.o. daily. 17.Nicotine Patch 14 mg transdermal daily.  CONSULTATIONS:  Eulogio Ditch, M.D., Psychiatry.  BRIEF ADMISSION HISTORY OF PRESENT ILLNESS:  The patient is a Wayne [name] who was brought to the hospital with suicidal ideation and complaints of ingestion of unknown substances, apparently in a suicide attempt.  The patient was not quite clear regarding what medicines he took; however, it was thought that he took NyQuil in addition to extra oral hypoglycemics and insulin.  The patient was somnolent and not fully cooperative with his initial interview.  The full details, please see the dictated report done  by Dr. Haroldine Laws.  PROCEDURES AND DIAGNOSTIC STUDIES: 1. Chest x-ray on February 26, 2011, showed hypoventilation with     bibasilar atelectasis. 2. CT scan of the head on February 26, 2011, was negative.  DISCHARGE LABORATORY VALUES:  Sodium was 140, potassium 3.6, chloride 107, bicarbonate 28, BUN 11, creatinine 0.97, glucose 148, calcium 9.0. White blood cell count was 5, hemoglobin 12.2, hematocrit Wayne.9, platelets 146.  Hemoglobin A1c was 15.2.  Urine drug screen was positive for cocaine.  Ammonia was 44, salicylates less than 2, alcohol less than 11, acetaminophen less than 15.  HOSPITAL COURSE BY PROBLEM: 1. Intentional overdose of uncertain substances:  The patient was     somnolent, but hemodynamically stable over the course of this     hospital stay.  He maintained normal sinus rhythm with occasional     first-degree AV block on telemetry.  He had never became     hypoglycemic.  At this point, the patient is medically stable for     transfer to the [facility] for ongoing treatment of     his underlying psychiatric disturbance. 2. Uncontrolled type 2 diabetes:  The patient reports that he has not     been taking his insulin at home and given the marked elevation of     his hemoglobin A1c, needs better diabetes control.  We will set him     up with outpatient diabetes education classes.  We will put him on     a combination of Lantus, insulin resistant sliding scale, and meal     coverage. 3. Depression/suicidal ideation:  The patient has been provided with a     24-hour sitter for safety purposes.  He will be transferred to     [facility] for further psychiatric care.  He has been seen     and evaluated by Dr. Rogers Blocker who has accepted him at [facility]. 4. Cocaine abuse:  The patient will be given information regarding     substance abuse treatment centers in [facility]. 5. Obstructive sleep apnea:  The patient should continue on nocturnal      BiPAP for respiratory. 6. History of asthma:  The patient has had a stable respiratory status     and continue Symbicort. 7. Dyslipidemia:  The patient can be continued on his usual statin     therapy. 8. Tobacco abuse:  The patient was counseled about the importance of     cessation given his underlying comorbidities.  He was provided with     a nicotine patch.  DISPOSITION:  The patient is medically stable and will be discharged to [facility] today after confirming bed availability.  DISCHARGE INSTRUCTIONS:  Activity:  No restrictions.  Diet:  Diabetic.  Followup:  With Dr. Oliver Barre 1 week post discharge.  Time spent coordinating care for discharge and discharge instructions including face-to-face time equals approximately 25 minutes.     Hillery Aldo, M.D.     CR/MEDQ  D:  02/27/2011  T:  02/27/2011  Job:  [id number]  cc:   Corwin Levins, MD [address] [place] [address]  Electronically Signed by Hillery Aldo M.D. on 03/02/2011 07:12:23 AM

## 2011-03-03 LAB — GLUCOSE, CAPILLARY
Glucose-Capillary: 242 mg/dL — ABNORMAL HIGH (ref 70–99)
Glucose-Capillary: 223 mg/dL — ABNORMAL HIGH (ref 70–99)

## 2011-03-04 LAB — GLUCOSE, CAPILLARY
Glucose-Capillary: 140 mg/dL — ABNORMAL HIGH (ref 70–99)
Glucose-Capillary: 330 mg/dL — ABNORMAL HIGH (ref 70–99)
Glucose-Capillary: 211 mg/dL — ABNORMAL HIGH (ref 70–99)

## 2011-03-04 NOTE — Consult Note (Signed)
  NAMEHANNA, Wayne [name]                   ACCOUNT NO.:  [id number]  MEDICAL RECORD NO.:  [id number]  LOCATION:  1237                         FACILITY:  [facility]  PHYSICIAN:  Eulogio Ditch, MD DATE OF BIRTH:  1976/04/16  DATE OF CONSULTATION:  02/26/2011 DATE OF DISCHARGE:                                CONSULTATION   REASON FOR CONSULTATION:  Drug overdose.  HISTORY OF PRESENT ILLNESS:  35 year old male who was admitted in the ICU because of the overdose.  The patient told his sister after overdose.  The patient went to [facility] and was unresponsive in the hallway and then transported to the [facility].  The patient reported that he took overdose of Zoloft, NyQuil, and his blood pressure medications.  As per the patient, he do not have any job.  He is tired of taking care of grandmother and mother.  He has no psychiatrist to follow up for his depression, so he decided to kill himself.  The patient is logical and goal directed during the interview.  Still reported depressed mood and suicidal ideation, but denied hearing any voices, not internally preoccupied.  The patient has a history of diabetes.  Denied abuse of alcohol or illicit drugs.  A CT head is negative for any acute issue.  EKG is normal sinus rhythm.  DIAGNOSTIC IMPRESSION:  Axis I:  Mood disorder  not otherwise specified. Axis II:  Deferred. Axis III:  See medical notes. Axis IV:  Psychosocial stressors. Axis IV:  40.  RECOMMENDATIONS:  Once medically cleared, the patient can be transferred to [facility] for further stabilization.  The patient agrees with this treatment plan.     Eulogio Ditch, MD     SA/MEDQ  D:  02/26/2011  T:  02/26/2011  Job:  [id number]  Electronically Signed by Eulogio Ditch  on 03/04/2011 08:54:45 AM

## 2011-03-05 LAB — GLUCOSE, CAPILLARY: Glucose-Capillary: 345 mg/dL — ABNORMAL HIGH (ref 70–99)

## 2011-03-08 NOTE — Discharge Summary (Signed)
Wayne [name], Wayne [name]                   ACCOUNT NO.:  [id number]  MEDICAL RECORD NO.:  [id number]  LOCATION:  0501                          FACILITY:  [facility]  PHYSICIAN:  Franchot Gallo, MD     DATE OF BIRTH:  05-25-1976  DATE OF ADMISSION:  02/27/2011 DATE OF DISCHARGE:  03/05/2011                              DISCHARGE SUMMARY   REASON FOR ADMISSION:  This is a 35 year old male that came in with feeling overwhelmed.  He had tried to overdose and commit suicide.  He is a transfer from the medical unit after patient overdosed on uncertain substances.  He was found to be nonresponsive and somnolent.  After the patient was medically stable, he came to our facility for further assessment.  FINAL IMPRESSION:  AXIS I:  Major depressive disorder.  Cocaine abuse. AXIS II:  Deferred. AXIS III:  History of uncontrolled type 2 diabetes, obstructive sleep apnea, asthma, dyslipidemia.  PERTINENT LABS:  Sodium was 140, hemoglobin A1c was 15.2.  Urine drug screen was positive for cocaine.  Ammonia level of 44.  Alcohol level less than 11.  Acetaminophen level less than 15.  SIGNIFICANT FINDINGS:  The patient was admitted to the adult milieu for safety and stabilization.  We continued to monitor his blood sugars and continue with his other medical medications.  He was participating in groups.  The patient initially was having trouble with sleep, but his appetite was good.  Having mild depressive symptoms rating it a 7. Having episodic suicidal thoughts without plan or intent.  We increased his Zoloft for depression and anxiety.  Had Vistaril available for anxiety and had Seroquel for sleep and voices.  His sleep was improving. His appetite was good.  His depression was low at 4 on a scale of 1-10. Denied any psychotic symptoms, having no medication side effects.  We discontinued the patient's Lipitor and ordered Niaspan for cholesterol as the patient was having muscle problems with his Lipitor.   He continued to sleep and have a good appetite, and showing no medication side effects.  We had contact with the patient's brother, Kevin Fenton, to address any safety issues and for [place] to provide information.  The patient was then having problems again with sleep and Ambien was ordered.  We continued with using recommendations from the diabetic nurse for managing the patient's blood sugars.  On day of discharge, the patient's sleep was good, appetite was good.  He had mild depressive symptoms rating it a 2 on a scale 1-10.  Adamantly denied any suicidal or homicidal thoughts, or auditory hallucinations.  His anxiety was mild rating it a 2 and having no medication side effects.  DISCHARGE MEDICATIONS: 1. Aspirin 81 mg daily. 2. Hydroxyzine 25 mg one t.i.d. 3. Metformin 500 mg one b.i.d. 4. Niacin 500 mg one nightly. 5. Seroquel 50 mg one nightly. 6. Zoloft 100 mg taking 1-1/2 daily. 7. Ambien 10 mg for sleep as needed. 8. Glipizide 10 mg daily. 9. Lantus insulin 45 units subcutaneous nightly. 10.Lisinopril 10 mg daily. 11.Nexium 40 mg daily. 12.Nicotine patches as needed. 13.Symbicort 160/4.5 two puffs b.i.d. 14.Vitamin D daily. 15.The patient was to stop taking his  Lipitor.  FOLLOW UP: 1. The patient was to follow up with his primary care provider in a     week. 2. The patient was provided information about the [facility]. 3. Follow up was scheduled with [facility]. 4. The patient was to return home and follow up with his primary care     provider for further assessment of his medical needs.     Landry Corporal, N.P.   ______________________________ Franchot Gallo, MD    JO/MEDQ  D:  03/08/2011  T:  03/08/2011  Job:  [id number]  Electronically Signed by Limmie PatriciaP. on 03/08/2011 02:39:08 PM Electronically Signed by Franchot Gallo MD on 03/08/2011 06:33:43 PM

## 2011-03-09 NOTE — Assessment & Plan Note (Signed)
Wayne [name], Wayne [name]                   ACCOUNT NO.:  [id number]  MEDICAL RECORD NO.:  [id number]  LOCATION:  0501                          FACILITY:  [facility]  PHYSICIAN:  Vic Ripper, P.A.-C.DATE OF BIRTH:  Jan 14, 1976  DATE OF ADMISSION:  02/27/2011 DATE OF DISCHARGE:                      PSYCHIATRIC ADMISSION ASSESSMENT   This is a voluntary admission to the services of Dr. Harvie Heck Readling.  This is a 35 year old, single [place] American male.  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  He states he was just overwhelmed.  He has been staying with his mother and his grandmother who has dementia.  Again, he became overwhelmed and apparently, he attempted to overdose and commit suicide.  It is not exactly clear what he took but apparently, he told his sister that he tried to kill himself, and she called the police.  They came to his mother's apartment and picked him up and brought him here to Behavior [facility].  Supposedly, they left him in the hallway.  He was later found to be nonresponsive and transported over to [facility].  While in the ER, although somnolent, he was arousable.  He was not able to provide a good history.  He did say that he took NyQuil which is probably what caused him to become unresponsive.  He was admitted to the medical floor.  He was just kept 24 hours.  He was also seen in consultation by Dr. Rogers Blocker, and he was referred over here to the behavior health center for further stabilization and treatment.  PAST PSYCHIATRIC HISTORY:  He was here from 5/24 to 05/28 of 2010.  At that time, something similar, he had been kicked out.  He had no money, no place to go, and he stated at that time that he had been using cocaine fairly steadily.  He had attended court-ordered treatment at [facility] in 2007 for a 2005 paraphernalia charge.  Apparently, he has had a history for some sort of substance abuse since age 77.  SOCIAL HISTORY:  He  reports that he got to his junior year in college. He has never married.  No kids.  Currently not employed.  He quit 2 weeks ago.  He was employed at a group home.  FAMILY HISTORY:  He states that his maternal grandmother has depression and anxiety and also dementia.  His father is bipolar.  His maternal grandmother was schizophrenic, and his sister is schizophrenic.  ALCOHOL AND DRUG HISTORY:  He reports he quit smoking 1 week ago.  He has not been using cocaine since he was employed at the group home.  He was not positive on admission this time.  MEDICAL PROBLEMS:  He states he developed diabetes mellitus in 2007.  He has a history for asthma, dyslipidemia, obstructive sleep apnea.  He is on an nocturnal BiPAP.  He says he just quit smoking a week ago.  MEDICATIONS AT THE TIME OF DISCHARGE FROM INPATIENT: 1. Lantus 45 units subcu at h.s. 2. Sliding-scale insulin resistance scale q.a.c. and h.s. 3. NovoLog 8 units subcu q.a.c. 4. Glucosamine 500 mg p.o. daily. 5. Symbicort 160/4.5 mcg 2 puffs b.i.d. p.r.n. 6.  Metformin 500 mg p.o. b.i.d. 7. Glipizide 10 mg p.o. daily. 8. Multivitamin 1 tablet p.o. daily. 9. Vitamin D 1000 units p.o. daily. 10.Sertraline 100 mg p.o. daily. 11.Nexium 40 mg p.o. daily. 12.Lisinopril 10 mg p.o. daily. 13.Lipitor 10 mg p.o. daily. 14.Ibuprofen 800 mg p.o. q.8 hours p.r.n. 15.Fish oil 1000 mg p.o. daily. 16.Enteric-coated aspirin 81 mg p.o. daily. 17.Nicotine patch 14 mg transdermal daily.  DRUG ALLERGIES:  STATINS.  POSITIVE PHYSICAL FINDINGS:  His physical exam is well-documented and on the chart.  MENTAL STATUS EXAM:  He is alert and oriented.  He was interviewed in his room.  He was in the bed.  He is appropriately groomed, dressed and nourished, although he is in hospital scrubs.  His speech is not pressured.  His mood is actually euthymic.  His thought processes are clear, rational and goal-oriented.  He says some of his family  members have encouraged him to apply for disability.  Judgment and insight are fair.  Concentration and memory are intact.  Intelligence is average. He still feels depressed and worthless.  He did try to cut on his left arm the other day, but he is not actively suicidal at this time, and he is not homicidal.  He is not having any hallucinations.  DIAGNOSES:  Axis I:  Mood disorder not otherwise specified.  He is not sure that the Zoloft is the right medicine for him. Axis II:  Deferred, but rule out addictive personality disorder. Axis III:  Type 2 diabetes mellitus, obstructive sleep apnea on nocturnal BiPAP, dyslipidemia, history of asthma. Axis IV:  Severe.  He has issues with housing, economics, occupation. Axis V:  45.  PLAN:  Admit for safety and stabilization.  His meds will be adjusted as indicated.  We will have to have the case managers work with him regarding a discharge plan.     Vic Ripper, P.A.-C.     MD/MEDQ  D:  02/27/2011  T:  02/27/2011  Job:  [id number]  Electronically Signed by Jaci Lazier ADAMS P.A.-C. on 03/06/2011 11:39:54 AM Electronically Signed by Orson Aloe  on 03/09/2011 05:54:01 PM

## 2011-03-11 ENCOUNTER — Ambulatory Visit: Payer: PRIVATE HEALTH INSURANCE | Admitting: Internal Medicine

## 2011-03-16 ENCOUNTER — Ambulatory Visit (INDEPENDENT_AMBULATORY_CARE_PROVIDER_SITE_OTHER): Payer: PRIVATE HEALTH INSURANCE | Admitting: Internal Medicine

## 2011-03-16 ENCOUNTER — Encounter: Payer: Self-pay | Admitting: Internal Medicine

## 2011-03-16 VITALS — BP 120/72 | HR 94 | Temp 98.6°F | Ht 73.0 in | Wt 192.4 lb

## 2011-03-16 DIAGNOSIS — E785 Hyperlipidemia, unspecified: Secondary | ICD-10-CM

## 2011-03-16 DIAGNOSIS — I10 Essential (primary) hypertension: Secondary | ICD-10-CM

## 2011-03-16 DIAGNOSIS — E119 Type 2 diabetes mellitus without complications: Secondary | ICD-10-CM

## 2011-03-16 MED ORDER — LISINOPRIL 10 MG PO TABS: 10.0000 mg | ORAL_TABLET | Freq: Every day | ORAL | Status: DC

## 2011-03-16 MED ORDER — CYCLOBENZAPRINE HCL 5 MG PO TABS: 5.0000 mg | ORAL_TABLET | Freq: Three times a day (TID) | ORAL | Status: DC | PRN

## 2011-03-16 MED ORDER — SERTRALINE HCL 100 MG PO TABS: ORAL_TABLET | ORAL | Status: DC

## 2011-03-16 MED ORDER — METFORMIN HCL 500 MG PO TABS: 1000.0000 mg | ORAL_TABLET | Freq: Two times a day (BID) | ORAL | Status: DC

## 2011-03-16 MED ORDER — GLIPIZIDE ER 10 MG PO TB24: 10.0000 mg | ORAL_TABLET | Freq: Every day | ORAL | Status: DC

## 2011-03-16 NOTE — Assessment & Plan Note (Signed)
Improved overall;  To f/u with psychiatry as planned

## 2011-03-16 NOTE — Patient Instructions (Addendum)
Increase the metformin to 1000 mg twice per day Re-start the lisinopril for blood pressure, and flexeril for back pain Continue all other medications as before, including all new medication recently started, such as the niacin for cholesterol Please keep your appointments with your specialists as you have planned - psychiatry Please return in 6 wks with Lab testing done 3-5 days before

## 2011-03-16 NOTE — Assessment & Plan Note (Signed)
stable overall by hx and exam, most recent data reviewed with pt, and pt to continue medical treatment as before  Lab Results  Component Value Date   LDLCALC 122* 02/01/2011

## 2011-03-16 NOTE — Progress Notes (Signed)
Subjective:    Patient ID: Wayne [name], male    DOB: 1976/05/20, 35 y.o.   MRN: [id number]  HPI  Here to f/u; was recetnly hospd at [facility] with depression/anxiety after suicidal gesture with cutting right arm;  Now improved overall , denies suicidal ideation, now on seroquel and incresed sertraline at 150mg , as well as niacin, and still with mult social /family stressors;  Has f/u with psychiatry but had to re-schedule the aug 15 appt he had due to being out of town;  Sugars recently have been mostly in the 2000's, occasional 300's;  Has not seen podiatry and ortho due to his insurance not covering this, and wonders how to apply for medicaid.  Vistaril working ok for anxiety.  Ran out of metformin, lisinopri recetnly as well. Pt continues to have recurring LBP without change in severity, bowel or bladder change, fever, wt loss,  worsening LE pain/numbness/weakness, gait change or fall.  Has occasinal muscle spasm to right neck as well. Past Medical History  Diagnosis Date  . Depression   . Diabetes mellitus   . Hyperlipidemia   . HTN (hypertension)   . Chronic headaches   . Osteoarthritis   . Kidney stones   . Polysubstance abuse     cocaine, marijuana-quit summer 2010  . Tobacco abuse   . Asthma   . Chest pain   . Lumbar disc disease   . GERD (gastroesophageal reflux disease)   . OSA (obstructive sleep apnea)   . Peripheral neuropathy 02/01/2011   Past Surgical History  Procedure Date  . Knee surgery   . Bunionectomy 06-2009    reports that he has quit smoking. His smoking use included Cigarettes. He has a 3.4 pack-year smoking history. He does not have any smokeless tobacco history on file. He reports that he uses illicit drugs. He reports that he does not drink alcohol. family history includes Asthma in his mother and sister; Diabetes in his father; Emphysema in his maternal uncle; Heart disease in his mother; Hyperlipidemia in his father; Hypertension in his father; and Stroke in his  mother. No Known Allergies Current Outpatient Prescriptions on File Prior to Visit  Medication Sig Dispense Refill  . aspirin 81 MG tablet Take 162 mg by mouth daily.       . Cholecalciferol (VITAMIN D) 1000 UNITS capsule Take 1,000 Units by mouth daily.        . fish oil-omega-3 fatty acids 1000 MG capsule Take 2 g by mouth daily.        [name] glipiZIDE (GLUCOTROL) 10 MG 24 hr tablet Take 10 mg by mouth daily.        . Glucosamine 500 MG TABS Take 1 tablet by mouth daily.        [name] glucose blood (FREESTYLE LITE) test strip 1 each by Other route as needed. Use as instructed       . ibuprofen (ADVIL,MOTRIN) 800 MG tablet Take 800 mg by mouth every 6 (six) hours as needed.        . insulin glargine (LANTUS SOLOSTAR) 100 UNIT/ML injection Inject 50 Units into the skin daily.  10 mL  11  . insulin lispro (HUMALOG KWIKPEN) 100 UNIT/ML injection Inject 4 Units into the skin 3 (three) times daily before meals.  10 mL  11  . lansoprazole (PREVACID) 30 MG capsule Take 30 mg by mouth daily.        [name] lisinopril (PRINIVIL,ZESTRIL) 10 MG tablet Take 10 mg by mouth daily.        [name]  metFORMIN (GLUCOPHAGE) 500 MG tablet Take 500 mg by mouth 2 (two) times daily with a meal.        . Multiple Vitamin (MULTIVITAMIN) capsule Take 1 capsule by mouth daily.        . ONE TOUCH LANCETS MISC as directed.        . sertraline (ZOLOFT) 100 MG tablet Take 1 tablet (100 mg total) by mouth daily.  90 tablet  3   Review of Systems Review of Systems  Constitutional: Negative for diaphoresis and unexpected weight change.  HENT: Negative for drooling and tinnitus.   Eyes: Negative for photophobia and visual disturbance.  Respiratory: Negative for choking and stridor.   Gastrointestinal: Negative for vomiting and blood in stool.  Genitourinary: Negative for hematuria and decreased urine volume.  Musculoskeletal: Negative for gait problem.       Objective:   Physical Exam BP 120/72  Pulse 94  Temp(Src) 98.6 F (37 C)  (Oral)  Ht 6\' 1"  (1.854 m)  Wt 192 lb 6.4 oz (87.272 kg)  BMI 25.38 kg/m2  SpO2 98% Physical Exam  VS noted Constitutional: Pt appears well-developed and well-nourished.  HENT: Head: Normocephalic.  Right Ear: External ear normal.  Left Ear: External ear normal.  Eyes: Conjunctivae and EOM are normal. Pupils are equal, round, and reactive to light.  Neck: Normal range of motion. Neck supple.  Cardiovascular: Normal rate and regular rhythm.   Pulmonary/Chest: Effort normal and breath sounds normal.  Abd:  Soft, NT, non-distended, + BS Neurological: Pt is alert. No cranial nerve deficit. motor/dtr intect, slr neg bilat Skin: Skin is warm. No erythema.  Psychiatric: Pt behavior is normal. Thought content normal. 1+ nervous Spine nontender     Assessment & Plan:

## 2011-03-16 NOTE — Assessment & Plan Note (Signed)
stable overall by hx and exam, most recent data reviewed with pt, and pt to continue medical treatment as before  Ace refilled;   Lab Results  Component Value Date   WBC 5.0 02/27/2011   HGB 12.2* 02/27/2011   HCT 35.9* 02/27/2011   PLT 146* 02/27/2011   CHOL 189 02/01/2011   TRIG 95.0 02/01/2011   HDL 47.90 02/01/2011   LDLDIRECT 130.0 06/05/2010   ALT 26 02/26/2011   AST 19 02/26/2011   NA 140 02/27/2011   K 3.6 02/27/2011   CL 107 02/27/2011   CREATININE 0.97 02/27/2011   BUN 11 02/27/2011   CO2 28 02/27/2011   TSH 0.32* 01/10/2009   INR 1.0 12/29/2008   HGBA1C 15.2* 02/26/2011   MICROALBUR 0.2 01/10/2009

## 2011-03-16 NOTE — Assessment & Plan Note (Addendum)
Mild uncontrolled, To increase the metformin to 1000 bid, Continue all other medications as before, f/u labs next visit , now has free lantus/novolog per pt assist Lab Results  Component Value Date   HGBA1C 15.2* 02/26/2011

## 2011-03-31 ENCOUNTER — Ambulatory Visit: Payer: PRIVATE HEALTH INSURANCE | Admitting: Internal Medicine

## 2011-04-07 ENCOUNTER — Ambulatory Visit (INDEPENDENT_AMBULATORY_CARE_PROVIDER_SITE_OTHER): Payer: PRIVATE HEALTH INSURANCE | Admitting: Pulmonary Disease

## 2011-04-07 ENCOUNTER — Encounter: Payer: Self-pay | Admitting: Pulmonary Disease

## 2011-04-07 VITALS — BP 120/82 | HR 95 | Temp 97.7°F | Ht 74.0 in | Wt 201.4 lb

## 2011-04-07 DIAGNOSIS — G4733 Obstructive sleep apnea (adult) (pediatric): Secondary | ICD-10-CM

## 2011-04-07 NOTE — Progress Notes (Signed)
  Subjective:    Patient ID: Wayne [name], male    DOB: [birth year], 35 y.o.   MRN: [id number]  HPI Patient comes in today for followup of his known moderate sleep apnea.  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  He continues to have issues with fragmented sleep and excessive daytime sleepiness.  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  He feels that CPAP is not a viable therapy for him.   Review of Systems  Constitutional: Negative for fever and unexpected weight change.  HENT: Positive for ear pain, congestion, sore throat, trouble swallowing and neck pain. Negative for nosebleeds, rhinorrhea, sneezing, dental problem, postnasal drip and sinus pressure.   Eyes: Negative for redness and itching.  Respiratory: Positive for shortness of breath and wheezing. Negative for cough and chest tightness.   Cardiovascular: Negative for palpitations and leg swelling.  Gastrointestinal: Negative for nausea and vomiting.  Genitourinary: Negative for dysuria.  Musculoskeletal: Positive for joint swelling.  Skin: Negative for rash.  Neurological: Positive for headaches.  Hematological: Bruises/bleeds easily.  Psychiatric/Behavioral: Negative for dysphoric mood. The patient is not nervous/anxious.        Objective:   Physical Exam Modestly overweight male in no acute distress The skin breakdown or pressure necrosis from the CPAP mask Nose without purulence or discharge noted Lower extremities without edema, no cyanosis The patient appears very sleepy today, but oriented and moves all 4.       Assessment & Plan:

## 2011-04-07 NOTE — Assessment & Plan Note (Signed)
The patient had very poor tolerance of CPAP, and feels that it is not a viable therapy for him.  I have reviewed the various treatment options for his moderate sleep apnea, including modest weight loss, upper airway surgery, and also dental appliance.  I have given him patient education material on a dental appliance, and he states that he will consider.  He is in the process of trying to get disability, and his choice will depend upon what kind of insurance coverage that he gets.  He will let me know his decision once made.

## 2011-04-07 NOTE — Patient Instructions (Signed)
Work on [employer] reduction Consider either referral to ENT for surgical evaluation versus referral to dental medicine for a dental appliance.  Let me know which after reviewing the educational material provided.

## 2011-04-19 ENCOUNTER — Ambulatory Visit (INDEPENDENT_AMBULATORY_CARE_PROVIDER_SITE_OTHER): Payer: PRIVATE HEALTH INSURANCE | Admitting: Gastroenterology

## 2011-04-19 ENCOUNTER — Telehealth: Payer: Self-pay

## 2011-04-19 ENCOUNTER — Other Ambulatory Visit (INDEPENDENT_AMBULATORY_CARE_PROVIDER_SITE_OTHER): Payer: PRIVATE HEALTH INSURANCE

## 2011-04-19 ENCOUNTER — Encounter: Payer: Self-pay | Admitting: Gastroenterology

## 2011-04-19 VITALS — BP 158/100 | HR 83 | Ht 74.0 in | Wt 204.0 lb

## 2011-04-19 DIAGNOSIS — K219 Gastro-esophageal reflux disease without esophagitis: Secondary | ICD-10-CM

## 2011-04-19 DIAGNOSIS — E119 Type 2 diabetes mellitus without complications: Secondary | ICD-10-CM

## 2011-04-19 DIAGNOSIS — K5900 Constipation, unspecified: Secondary | ICD-10-CM

## 2011-04-19 LAB — BASIC METABOLIC PANEL
BUN: 14 mg/dL (ref 6–23)
CO2: 30 mEq/L (ref 19–32)
Glucose, Bld: 159 mg/dL — ABNORMAL HIGH (ref 70–99)
Potassium: 4.9 mEq/L (ref 3.5–5.1)
Sodium: 142 mEq/L (ref 135–145)

## 2011-04-19 LAB — LIPID PANEL: Cholesterol: 161 mg/dL (ref 0–200)

## 2011-04-19 MED ORDER — LISINOPRIL 20 MG PO TABS: 20.0000 mg | ORAL_TABLET | Freq: Every day | ORAL | Status: DC

## 2011-04-19 NOTE — Telephone Encounter (Signed)
Called patient informed medication has been called in for BP

## 2011-04-19 NOTE — Telephone Encounter (Signed)
I would rather try to increase the ACEI -   Increase the lisinopril to 20 mg per day   done per emr

## 2011-04-19 NOTE — Telephone Encounter (Signed)
Patient saw Dr. Christella Hartigan today and his BP was 158/100. Dr. Christella Hartigan wanted the patient to check with JWJ about going back on BP medication amlodipine? Patient is not scheduled with Dr. Jonny Ruiz until Oct. 2, 2012. Please advise

## 2011-04-19 NOTE — Patient Instructions (Addendum)
Please start taking citrucel (orange flavored) powder fiber supplement.  This may cause some bloating at first but that usually goes away. Begin with a small spoonful and work your way up to a large, heaping spoonful daily over a week. Prevacid, prilosec, protonix (and their generic equivalents) are all available over the counter.    Best taken 20-30 minutes before a meal. Samples of PPI given. A copy of this information will be made available to Dr. Jonny Ruiz.

## 2011-04-19 NOTE — Progress Notes (Signed)
Review of pertinent gastrointestinal problems: 1. minor rectal bleeding in December 2010: Likely resolved hemorrhoid. Colonoscopy December 2010 was normal to the terminal ileum. 2. GERD symptoms, EGD December 2010 was normal. Symptoms seem to improve with proton pump inhibitor once daily  HPI: This is a very pleasant 35 year old man whom I last saw about 2 years ago.  Having a lot of pyrosis.  Wants samples of meds because he has no medicine coverage.  Was on benefiber.  Has intermittent constipation as well.  Out of prevacid.  He has no alarm symptoms.    Past Medical History:   Depression                                                   Diabetes mellitus                                            Hyperlipidemia                                               HTN (hypertension)                                           Chronic headaches                                            Osteoarthritis                                               Kidney stones                                                Polysubstance abuse                                            Comment:cocaine, marijuana-quit summer 2010   Tobacco abuse                                                Asthma                                                       Chest pain  Lumbar disc disease                                          GERD (gastroesophageal reflux disease)                       OSA (obstructive sleep apnea)                                Peripheral neuropathy                           02/01/2011    Past Surgical History:   KNEE SURGERY                                                 BUNIONECTOMY                                    06-2009      reports that he quit smoking about 7 weeks ago. His smoking use included Cigarettes. He has a 3.4 pack-year smoking history. He has quit using smokeless tobacco. He reports that he drinks alcohol. He reports that he  uses illicit drugs.  family history includes Alzheimer's disease in his maternal grandmother; Asthma in his mother and sister; Breast cancer in his maternal aunt; COPD in his mother; Depression in his father; Diabetes in his father; Emphysema in his maternal uncle; Heart disease in his mother; Hyperlipidemia in his father; Hypertension in his father; and Stroke in his mother.    Current medicines and allergies were reviewed in [facility]    Physical Exam: BP 158/100  Pulse 83  Ht 6\' 2"  (1.88 m)  Wt 204 lb (92.534 kg)  BMI 26.19 kg/m2 Constitutional: generally well-appearing Psychiatric: alert and oriented x3 Abdomen: soft, nontender, nondistended, no obvious ascites, no peritoneal signs, normal bowel sounds     Assessment and plan: 35 y.o. male with Chronic GERD, intermittent constipation.  She will start fiber supplements with Citrucel. We have given him samples of proton pump inhibitor and have given him the names of several very good over-the-counter proton pump inhibitors to begin taking once daily.

## 2011-04-20 LAB — HEMOGLOBIN A1C: Hgb A1c MFr Bld: 10.4 % — ABNORMAL HIGH (ref 4.6–6.5)

## 2011-04-27 ENCOUNTER — Encounter: Payer: Self-pay | Admitting: Internal Medicine

## 2011-04-27 ENCOUNTER — Ambulatory Visit (INDEPENDENT_AMBULATORY_CARE_PROVIDER_SITE_OTHER): Payer: PRIVATE HEALTH INSURANCE | Admitting: Internal Medicine

## 2011-04-27 VITALS — BP 130/92 | HR 88 | Temp 98.0°F | Ht 73.0 in | Wt 206.0 lb

## 2011-04-27 DIAGNOSIS — J029 Acute pharyngitis, unspecified: Secondary | ICD-10-CM | POA: Insufficient documentation

## 2011-04-27 DIAGNOSIS — Z23 Encounter for immunization: Secondary | ICD-10-CM

## 2011-04-27 DIAGNOSIS — E785 Hyperlipidemia, unspecified: Secondary | ICD-10-CM

## 2011-04-27 DIAGNOSIS — J45909 Unspecified asthma, uncomplicated: Secondary | ICD-10-CM | POA: Insufficient documentation

## 2011-04-27 DIAGNOSIS — Z0000 Encounter for general adult medical examination without abnormal findings: Secondary | ICD-10-CM | POA: Insufficient documentation

## 2011-04-27 DIAGNOSIS — E119 Type 2 diabetes mellitus without complications: Secondary | ICD-10-CM

## 2011-04-27 MED ORDER — DOXYCYCLINE HYCLATE 100 MG PO TABS: 100.0000 mg | ORAL_TABLET | Freq: Two times a day (BID) | ORAL | Status: AC

## 2011-04-27 NOTE — Assessment & Plan Note (Signed)
Mild persistent uncontrolled, unable to afford meds - for symbicort sample today,  to f/u any worsening symptoms or concerns

## 2011-04-27 NOTE — Progress Notes (Signed)
Subjective:    Patient ID: Wayne [name], male    DOB: November 16, 1975, 35 y.o.   MRN: [id number]  HPI  Here to f/u; overall doing ok,  Pt denies chest pain, increased sob or doe, wheezing, orthopnea, PND, increased LE swelling, palpitations, dizziness or syncope.  Pt denies new neurological symptoms such as new headache, or facial or extremity weakness or numbness   Pt denies polydipsia, polyuria, or low sugar symptoms such as weakness or confusion improved with po intake.  Pt states overall fair compliance with meds, and trying to follow lower cholesterol, diabetic diet has been a challenge as he has been "doing more for the family", wt overall stable but little exercise however.  Psychiatry suggests seroquel may be helping the sugars higher.  Has had several lower sugars recently as well with current meds as his diet is variable and sometimes does no eat at all.  Incidentally with 2-3 days severe ST, low grade fever, malaise., tender jawline knots, others sick at home with similar.  Even prior to ST though has been using his rescue inhaler more than twice per wk due to wheezing, sob Past Medical History  Diagnosis Date  . Depression   . Diabetes mellitus   . Hyperlipidemia   . HTN (hypertension)   . Chronic headaches   . Osteoarthritis   . Kidney stones   . Polysubstance abuse     cocaine, marijuana-quit summer 2010  . Tobacco abuse   . Asthma   . Chest pain   . Lumbar disc disease   . GERD (gastroesophageal reflux disease)   . OSA (obstructive sleep apnea)   . Peripheral neuropathy 02/01/2011   Past Surgical History  Procedure Date  . Knee surgery   . Bunionectomy 06-2009    reports that he quit smoking about 2 months ago. His smoking use included Cigarettes. He has a 3.4 pack-year smoking history. He has quit using smokeless tobacco. He reports that he drinks alcohol. He reports that he uses illicit drugs. family history includes Alzheimer's disease in his maternal grandmother; Asthma in his  mother and sister; Breast cancer in his maternal aunt; COPD in his mother; Depression in his father; Diabetes in his father; Emphysema in his maternal uncle; Heart disease in his mother; Hyperlipidemia in his father; Hypertension in his father; and Stroke in his mother. Allergies  Allergen Reactions  . Iodine     rash  . Nsaids     Abdominal cramping   Current Outpatient Prescriptions on File Prior to Visit  Medication Sig Dispense Refill  . aspirin 81 MG tablet Take 162 mg by mouth daily.       . Cholecalciferol (VITAMIN D) 1000 UNITS capsule Take 1,000 Units by mouth daily.        . cyclobenzaprine (FLEXERIL) 5 MG tablet Take 1 tablet (5 mg total) by mouth every 8 (eight) hours as needed for muscle spasms.  60 tablet  1  . fish oil-omega-3 fatty acids 1000 MG capsule Take 2 g by mouth daily.        [name] glipiZIDE (GLUCOTROL XL) 10 MG 24 hr tablet Take 1 tablet (10 mg total) by mouth daily.  30 tablet  11  . Glucosamine 500 MG TABS Take 1 tablet by mouth daily.        [name] glucose blood (FREESTYLE LITE) test strip 1 each by Other route as needed. Use as instructed       . haloperidol (HALDOL) 5 MG tablet Take 5 mg  by mouth daily. At bedtime       . hydrOXYzine (VISTARIL) 25 MG capsule Take 25 mg by mouth 3 (three) times daily as needed.        . insulin glargine (LANTUS SOLOSTAR) 100 UNIT/ML injection Inject 50 Units into the skin daily.  10 mL  11  . insulin lispro (HUMALOG KWIKPEN) 100 UNIT/ML injection Inject 4 Units into the skin 3 (three) times daily before meals.  10 mL  11  . lisinopril (PRINIVIL,ZESTRIL) 20 MG tablet Take 1 tablet (20 mg total) by mouth daily.  90 tablet  3  . metFORMIN (GLUCOPHAGE) 500 MG tablet Take 2 tablets (1,000 mg total) by mouth 2 (two) times daily with a meal.  120 tablet  11  . Multiple Vitamin (MULTIVITAMIN) capsule Take 1 capsule by mouth daily.        . niacin 500 MG tablet Take 500 mg by mouth daily.        . ONE TOUCH LANCETS MISC as directed.        [name]  QUEtiapine (SEROQUEL) 50 MG tablet Take 50 mg by mouth at bedtime.        . sertraline (ZOLOFT) 100 MG tablet 1 and 1/2 tab by mouth per day  90 tablet  3  . zolpidem (AMBIEN) 10 MG tablet Take 10 mg by mouth at bedtime as needed.         Review of Systems Review of Systems  Constitutional: Negative for diaphoresis and unexpected weight change.  HENT: Negative for drooling and tinnitus.   Eyes: Negative for photophobia and visual disturbance.  Respiratory: Negative for choking and stridor.   Gastrointestinal: Negative for vomiting and blood in stool.  Genitourinary: Negative for hematuria and decreased urine volume.     Objective:   Physical Exam BP 130/92  Pulse 88  Temp(Src) 98 F (36.7 C) (Oral)  Ht 6\' 1"  (1.854 m)  Wt 206 lb (93.441 kg)  BMI 27.18 kg/m2  SpO2 98% Physical Exam  VS noted,mild ill Constitutional: Pt appears well-developed and well-nourished.  HENT: Head: Normocephalic.  Right Ear: External ear normal.  Left Ear: External ear normal.  \Bilat tm's mild erythema.  Sinus nontender.  Pharynx severe erythema, bilat submandib LA noted tender mobile Eyes: Conjunctivae and EOM are normal. Pupils are equal, round, and reactive to light.  Neck: Normal range of motion. Neck supple.  Cardiovascular: Normal rate and regular rhythm.   Pulmonary/Chest: Effort normal and breath sounds normal.  Abd:  Soft, NT, non-distended, + BS Neurological: Pt is alert. No cranial nerve deficit.  Skin: Skin is warm. No erythema.  Psychiatric: Pt behavior is normal. Thought content normal.         Assessment & Plan:

## 2011-04-27 NOTE — Assessment & Plan Note (Signed)
stable overall by hx and exam, most recent data reviewed with pt, and pt to continue medical treatment as before  Lab Results  Component Value Date   LDLCALC 101* 04/19/2011   Goal ldl < 70, pt declines further med at this time, to do better with diet and meds

## 2011-04-27 NOTE — Assessment & Plan Note (Signed)
Improved, but uncontrolled, main problem seems to be consistency with his lifestyle and compliance with diet, meds, and lack of exercise;  To cont med and encouraged compliance

## 2011-04-27 NOTE — Patient Instructions (Addendum)
You had the flu shot today Take all new medications as prescribed - the antibiotic at the drugstore The current medical regimen is effective;  continue present plan and medications. You are given the sample of the symbicort Please have the pharmacy call if you need any refills Please return in 6 mo with Lab testing done 3-5 days before

## 2011-04-27 NOTE — Assessment & Plan Note (Signed)
Mild to mod, for antibx course,  to f/u any worsening symptoms or concerns 

## 2011-05-03 ENCOUNTER — Ambulatory Visit: Payer: PRIVATE HEALTH INSURANCE | Admitting: Internal Medicine

## 2011-05-05 ENCOUNTER — Ambulatory Visit: Payer: PRIVATE HEALTH INSURANCE | Admitting: Internal Medicine

## 2011-05-06 LAB — I-STAT 8, (EC8 V) (CONVERTED LAB)
Bicarbonate: 28 — ABNORMAL HIGH
Glucose, Bld: 102 — ABNORMAL HIGH
TCO2: 29
pH, Ven: 7.365 — ABNORMAL HIGH

## 2011-05-06 LAB — URINALYSIS, ROUTINE W REFLEX MICROSCOPIC
Bilirubin Urine: NEGATIVE
Nitrite: NEGATIVE
Protein, ur: 30 — AB
Specific Gravity, Urine: 1.029
Urobilinogen, UA: 1

## 2011-05-06 LAB — URINE CULTURE

## 2011-05-06 LAB — DIFFERENTIAL
Basophils Absolute: 0
Basophils Relative: 0
Eosinophils Absolute: 0.1
Eosinophils Relative: 2
Monocytes Absolute: 0.6

## 2011-05-06 LAB — CBC
HCT: 39.5
Hemoglobin: 13.5
MCHC: 34.1
MCV: 92.3
Platelets: 227
RDW: 13.4

## 2011-05-06 LAB — POCT CARDIAC MARKERS
CKMB, poc: 1.2
Troponin i, poc: <0.05

## 2011-05-06 LAB — URINE MICROSCOPIC-ADD ON

## 2011-05-10 ENCOUNTER — Ambulatory Visit: Payer: PRIVATE HEALTH INSURANCE | Admitting: Internal Medicine

## 2011-05-12 ENCOUNTER — Inpatient Hospital Stay (HOSPITAL_COMMUNITY)
Admission: EM | Admit: 2011-05-12 | Discharge: 2011-05-14 | DRG: 639 | Disposition: A | Discharge status: Home / Self Care | Payer: PRIVATE HEALTH INSURANCE | Attending: Internal Medicine | Admitting: Internal Medicine

## 2011-05-12 ENCOUNTER — Encounter (HOSPITAL_COMMUNITY): Payer: Self-pay | Admitting: Radiology

## 2011-05-12 ENCOUNTER — Emergency Department (HOSPITAL_COMMUNITY): Payer: PRIVATE HEALTH INSURANCE

## 2011-05-12 DIAGNOSIS — J45909 Unspecified asthma, uncomplicated: Secondary | ICD-10-CM | POA: Diagnosis present

## 2011-05-12 DIAGNOSIS — G4733 Obstructive sleep apnea (adult) (pediatric): Secondary | ICD-10-CM | POA: Diagnosis present

## 2011-05-12 DIAGNOSIS — R4182 Altered mental status, unspecified: Secondary | ICD-10-CM | POA: Diagnosis present

## 2011-05-12 DIAGNOSIS — E1169 Type 2 diabetes mellitus with other specified complication: Principal | ICD-10-CM | POA: Diagnosis present

## 2011-05-12 DIAGNOSIS — I959 Hypotension, unspecified: Secondary | ICD-10-CM | POA: Diagnosis present

## 2011-05-12 DIAGNOSIS — E785 Hyperlipidemia, unspecified: Secondary | ICD-10-CM | POA: Diagnosis present

## 2011-05-12 DIAGNOSIS — F3289 Other specified depressive episodes: Secondary | ICD-10-CM | POA: Diagnosis present

## 2011-05-12 DIAGNOSIS — Z794 Long term (current) use of insulin: Secondary | ICD-10-CM

## 2011-05-12 DIAGNOSIS — F329 Major depressive disorder, single episode, unspecified: Secondary | ICD-10-CM | POA: Diagnosis present

## 2011-05-12 LAB — POCT I-STAT TROPONIN I

## 2011-05-12 LAB — CBC
HCT: 36.6 % — ABNORMAL LOW (ref 39.0–52.0)
MCH: 31.7 pg (ref 26.0–34.0)
MCHC: 35.2 g/dL (ref 30.0–36.0)
MCV: 89.9 fL (ref 78.0–100.0)
Platelets: 170 10*3/uL (ref 150–400)
RDW: 13.2 % (ref 11.5–15.5)

## 2011-05-12 LAB — COMPREHENSIVE METABOLIC PANEL
Albumin: 3.8 g/dL (ref 3.5–5.2)
Alkaline Phosphatase: 86 U/L (ref 39–117)
BUN: 16 mg/dL (ref 6–23)
Creatinine, Ser: 1.37 mg/dL — ABNORMAL HIGH (ref 0.50–1.35)
GFR calc Af Amer: 76 mL/min — ABNORMAL LOW (ref >90)
Glucose, Bld: 220 mg/dL — ABNORMAL HIGH (ref 70–99)
Total Bilirubin: 0.3 mg/dL (ref 0.3–1.2)
Total Protein: 6.9 g/dL (ref 6.0–8.3)

## 2011-05-12 LAB — RAPID URINE DRUG SCREEN, HOSP PERFORMED
Amphetamines: NOT DETECTED
Benzodiazepines: NOT DETECTED
Opiates: NOT DETECTED

## 2011-05-12 LAB — SALICYLATE LEVEL: Salicylate Lvl: <2 mg/dL — ABNORMAL LOW (ref 2.8–20.0)

## 2011-05-12 LAB — DIFFERENTIAL
Eosinophils Absolute: 0.1 10*3/uL (ref 0.0–0.7)
Eosinophils Relative: 1 % (ref 0–5)
Lymphocytes Relative: 28 % (ref 12–46)
Lymphs Abs: 2.2 10*3/uL (ref 0.7–4.0)
Monocytes Absolute: 0.6 10*3/uL (ref 0.1–1.0)
Monocytes Relative: 8 % (ref 3–12)

## 2011-05-12 LAB — POCT I-STAT, CHEM 8
Calcium, Ion: 1.23 mmol/L (ref 1.12–1.32)
Creatinine, Ser: 1.5 mg/dL — ABNORMAL HIGH (ref 0.50–1.35)
Glucose, Bld: 222 mg/dL — ABNORMAL HIGH (ref 70–99)
Hemoglobin: 13.3 g/dL (ref 13.0–17.0)
Sodium: 143 mEq/L (ref 135–145)
TCO2: 25 mmol/L (ref 0–100)

## 2011-05-12 LAB — GLUCOSE, CAPILLARY: Glucose-Capillary: 192 mg/dL — ABNORMAL HIGH (ref 70–99)

## 2011-05-13 ENCOUNTER — Ambulatory Visit: Payer: PRIVATE HEALTH INSURANCE | Admitting: Internal Medicine

## 2011-05-13 ENCOUNTER — Inpatient Hospital Stay (HOSPITAL_COMMUNITY): Payer: PRIVATE HEALTH INSURANCE

## 2011-05-13 LAB — URINE MICROSCOPIC-ADD ON

## 2011-05-13 LAB — CK TOTAL AND CKMB (NOT AT ARMC)
CK, MB: 2.2 ng/mL (ref 0.3–4.0)
Relative Index: 0.7 (ref 0.0–2.5)
Relative Index: 0.7 (ref 0.0–2.5)
Total CK: 381 U/L — ABNORMAL HIGH (ref 7–232)
Total CK: 329 U/L — ABNORMAL HIGH (ref 7–232)

## 2011-05-13 LAB — CBC
Platelets: 171 10*3/uL (ref 150–400)
RBC: 4.07 MIL/uL — ABNORMAL LOW (ref 4.22–5.81)
RDW: 13.4 % (ref 11.5–15.5)
WBC: 9.6 10*3/uL (ref 4.0–10.5)

## 2011-05-13 LAB — URINALYSIS, ROUTINE W REFLEX MICROSCOPIC
Bilirubin Urine: NEGATIVE
Glucose, UA: >1000 mg/dL — AB
Hgb urine dipstick: NEGATIVE
Specific Gravity, Urine: 1.017 (ref 1.005–1.030)

## 2011-05-13 LAB — POCT I-STAT 3, ART BLOOD GAS (G3+)
Bicarbonate: 23.7 mEq/L (ref 20.0–24.0)
O2 Saturation: 97 %
TCO2: 25 mmol/L (ref 0–100)
pCO2 arterial: 43.8 mmHg (ref 35.0–45.0)
pH, Arterial: 7.342 — ABNORMAL LOW (ref 7.350–7.450)

## 2011-05-13 LAB — TROPONIN I
Troponin I: <0.3 ng/mL (ref <0.30)
Troponin I: <0.3 ng/mL (ref <0.30)

## 2011-05-13 LAB — PROTIME-INR
INR: 1.05 (ref 0.00–1.49)
Prothrombin Time: 13.9 seconds (ref 11.6–15.2)

## 2011-05-13 LAB — COMPREHENSIVE METABOLIC PANEL
Albumin: 3.3 g/dL — ABNORMAL LOW (ref 3.5–5.2)
Alkaline Phosphatase: 87 U/L (ref 39–117)
BUN: 13 mg/dL (ref 6–23)
CO2: 23 mEq/L (ref 19–32)
Chloride: 110 mEq/L (ref 96–112)
Creatinine, Ser: 0.93 mg/dL (ref 0.50–1.35)
GFR calc non Af Amer: >90 mL/min (ref >90)
Glucose, Bld: 272 mg/dL — ABNORMAL HIGH (ref 70–99)
Potassium: 3.9 mEq/L (ref 3.5–5.1)
Total Bilirubin: 0.3 mg/dL (ref 0.3–1.2)

## 2011-05-13 LAB — MRSA PCR SCREENING: MRSA by PCR: NEGATIVE

## 2011-05-13 LAB — APTT: aPTT: 25 seconds (ref 24–37)

## 2011-05-13 LAB — GLUCOSE, CAPILLARY

## 2011-05-13 NOTE — H&P (Signed)
NAMEJOHNGABRIEL, VERDE NO.:  [id number]  MEDICAL RECORD NO.:  [id number]  LOCATION:  [facility]                         FACILITY:  [facility]  PHYSICIAN:  Carlota Raspberry, MD         DATE OF BIRTH:  1975-10-08  DATE OF ADMISSION:  05/12/2011 DATE OF DISCHARGE:                             HISTORY & PHYSICAL   PRIMARY CARE PHYSICIAN:  Corwin Levins, MD  PULMONOLOGIST:  Otho Perl as Barbaraann Share, MD, FCCP  CHIEF COMPLAINT:  Altered mental status and somnolence.  HISTORY OF PRESENT ILLNESS:  This is a 35 year old male with a history of type 2 diabetes, hypertension/hyperlipidemia, OSA on CPAP, prior polysubstance abuse, and suicide attempt in August 2012 with ?NyQuil, insulin, and oral hypoglycemics overdose.  He presents with altered mental status and an unclear history.  Per discussion with the emergency room physician, he apparently picked up his sister earlier today and was driving a car and going to church.  At church, he was noted to look very diaphoretic and they thought that he was hypoglycemic and therefore gave him some OJ and some carbs, and EMS was called.  By this time, he was very somnolent.  His initial blood sugar with the EMS was 110.  He was brought to the emergency room.  On arrival here, he was responding to painful stimuli and intermittently to commands.  He was not opening his eyes spontaneously.  His initial vital signs showed a blood pressure of 73/37 with a pulse of 72, respirations 18, temp 97.1 rectally, and O2 saturation 96% on room air.  His blood pressure responded fairly quickly to 124/91 with IV fluids.  He was given Narcan and became a little bit more responsive, but not dramatically so that would be expected with giving Narcan and in opioid overdose.  He continues to remain fairly somnolent and is being admitted for further management.  In discussion with the patient, he initially is quite sleepy, but when aroused a bit more, he does open his  eyes and answer some questions, somewhat appropriately and somewhat not.  He denies having taken any overdose or that he was trying to hurt himself, but does state that he was taking some Zoloft earlier today.  However, he states he only took "2 pills," which does not make a lot of sense.  Otherwise, it is really quite difficult to get any other review of systems out of him.  Of note, the patient was admitted in August 2012 with almost the same history, but it appears that he was actually attempting to commit suicide at that time.  He had stated he had taken some NyQuil and had possibly taken some metformin and insulin as well.  His hemodynamics were clear, and once his somnolence improved, he was transferred to [facility] for further evaluation.  PAST MEDICAL HISTORY: 1. Type 2 diabetes with an admission in December 2011 for early DKA. 2. Hypertension. 3. Hyperlipidemia. 4. OSA on CPAP. 5. Prior polysubstance abuse. 6. Asthma. 7. Depression. 8. Suicide attempt in August 2012 with ?NyQuil, metformin, insulin     overdose.  PAST SURGICAL HISTORY:  Knee surgery.  MEDICATION LIST:  Completely unable to be reconciled at this time, but per documentation includes Seroquel, Zoloft, metformin, Lantus, and Humalog.  ALLERGIES:  No known drug allergies listed.  SOCIAL HISTORY:  Unable to be obtained, but per prior notes positive for tobacco and negative for alcohol or illicit drugs.  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  However, these are per March 15, 2011, discharge summary.  FAMILY HISTORY:  Also unobtainable, but per prior records significant for diabetes in his sister, mother, and father, hypertension in his father, mother, and sister, and coronary artery disease in his mother.  PHYSICAL EXAMINATION:  VITAL SIGNS:  Blood pressure is 105/72 and ranging  105-130/70-90 on the ED monitor, pulse is 93, 100% on room air. GENERAL:  He is a tall large male who was initially quite somnolent and not arousing much to verbal stimuli.  However, with more aggressive verbal and tactile stimuli, he awakens, opens his eyes, looks around, and answers questions in a somewhat appropriate and somewhat not appropriate fashion.  He looks like he is in no cardiopulmonary distress.  He is kind of inattentive.  However, he is able to follow some commands, i.e. he is able to sit up and let me listen to his lungs. HEENT:  His pupils are equal, round, reactive to light, but only for about 3-2 mm and they are, I would say, fairly constricted.  His extraocular muscles are grossly intact and his sclerae are grossly clear.  His mouth is moist and normal appearing.  I do not appreciate any oropharyngeal lesions. LUNGS:  Auscultated posteriorly as he was able to sit forward for me. They are clear to auscultation bilaterally.  No wheezes, crackles, rales, or rhonchi or other adventitious lung sounds. HEART:  Regular rate and rhythm with no murmurs or gallops appreciated, overall benign. ABDOMEN:  A bit protuberant, but is soft, nontender, nondistended, and quite benign.  Extremities are warm and well perfused with no cyanosis, clubbing.  There is no bilateral lower extremity edema.  He has good muscle tone and bulk. NEUROLOGIC:  He is difficult to examine.  His attentiveness and alertness are as above.  His speech was not slurred, but not completely normal either.  He was able to sit forward in the bed and move his upper extremities spontaneously.  LABORATORY WORK:  White blood cell count is 7.8 with a normal differential.  His hematocrit is 36.6 with an MCV of 89, his platelets are 170.  His chemistry panel is sodium of 140, potassium 3.5, chloride 105, bicarb 26, BUN and creatinine are 16 and 1.37 which is above his baseline of 0.9-1.1.  Glucose is 220.  LFTs are  normal with an albumin of 3.8, calcium 9.5.  Troponin is negative x1.  Tylenol and salicylate levels are negative.  His UDS shows THC, but is otherwise negative.  RADIOGRAPHY:  CT head shows no evidence of acute intracranial hemorrhage, mass lesion, or acute infarct.  EKG is normal sinus rhythm with a normal axis.  P-waves are overall normal.  QRS is narrow at 92 msec.  There are no Q-waves, but there may be very minimal PR depression.  ST junctions are a bit elevated in I, II, and V5.  There is some minimal J-point elevation in V2, V3.  There is PR elevation in AVR.  T-waves are all fairly normal appearing with some mild flattening in  III and V6.  However, in comparison to prior of March 15, 2011, the morphologies are actually quite similar and overall probably represents a normal variant.  IMPRESSION:  This is a 35 year old male with a history of type 2 diabetes, hypertension/hyperlipidemia, obstructive sleep apnea, on CPAP, and prior suicide attempt in August 2012 with NyQuil and possibly with metformin and insulin, who presents with an episode of diaphoresis and somnolence at church earlier today, and is found to be persistently somnolent. 1. Altered mental status.  Given the history, I think the differential     would be seizure activity with persistent postictal state.  Given     his elevated creatinine in the setting of taking oral hypoglycemics     and insulin, I question whether he has decreased insulin secretion     that lead to hypoglycemia and seizure activity.  Also on the     differential would be overmedication with SSRIs given that he is     taking Seroquel and Zoloft on his med list.  Suicide attempt with     other ingestibles may be on the differential as well.  We will get     an ABG given his history of CPAP usage.  We will complete his     workup with the UA, chest x-ray, serum osmolarity, lactate.  We     will do a rule out myocardial infarction with enzymes and  trend his     EKGs.  We will monitor him in the step-down unit and see if he     wakes up a bit and can get more history.  We will continue him with     maintenance IV fluids as well. 2. Hypotension.  This appears completely resolved through his ED     course and he is currently hemodynamically stable.  A question     whether he had spurious initial value versus a vasovagal episode     versus postictal state.  At present, he is completely stable and we     will just continue to monitor for now. 3. EKG changes.  The pattern of this would be somewhat consistent with     pericarditis, but I doubt that his presentation would be due to     this.  He is also not having any chest pain that he reports to me.     I would wait until he wakens up a bit and can give a better     history, and then if his history is suggestive, we will consider an     echo.  His troponin is negative x1.  Also of note, the EKG changes     today are also fairly consistent with prior in August 2012, and I     wonder whether this is just normal variant. 4. Diabetes.  We will not cover him with any insulin sliding scale for     now given the above, and monitor CBGs q 2-3. 5. Fluid, electrolytes, and nutrition.  We will give him 100 mL/h of     normal saline continuously until he is able to take p.o.  We will     keep him NPO for now. 6. Prophylaxis.  Subcutaneous heparin, Zofran, Tylenol, Colace and     senna when he is taking p.o. 7. IV access.  He has a peripheral IV. 8. Code status.  Unable to be discussed. 9. The patient will be admitted to the step-down unit to Team 3.  ______________________________ Carlota Raspberry, MD     EB/MEDQ  D:  05/13/2011  T:  05/13/2011  Job:  [id number]  Electronically Signed by Carlota Raspberry MD on 05/13/2011 07:51:24 PM

## 2011-05-14 ENCOUNTER — Inpatient Hospital Stay (HOSPITAL_COMMUNITY): Payer: PRIVATE HEALTH INSURANCE

## 2011-05-14 DIAGNOSIS — R4182 Altered mental status, unspecified: Secondary | ICD-10-CM

## 2011-05-14 LAB — COMPREHENSIVE METABOLIC PANEL
Albumin: 3.3 g/dL — ABNORMAL LOW (ref 3.5–5.2)
BUN: 10 mg/dL (ref 6–23)
Chloride: 106 mEq/L (ref 96–112)
Creatinine, Ser: 0.99 mg/dL (ref 0.50–1.35)
GFR calc non Af Amer: >90 mL/min (ref >90)
Total Bilirubin: 0.4 mg/dL (ref 0.3–1.2)

## 2011-05-14 LAB — CBC
HCT: 33 % — ABNORMAL LOW (ref 39.0–52.0)
MCH: 33.2 pg (ref 26.0–34.0)
MCV: 89.9 fL (ref 78.0–100.0)
RDW: 13.2 % (ref 11.5–15.5)
WBC: 5 10*3/uL (ref 4.0–10.5)

## 2011-05-14 LAB — GLUCOSE, CAPILLARY
Glucose-Capillary: 202 mg/dL — ABNORMAL HIGH (ref 70–99)
Glucose-Capillary: 192 mg/dL — ABNORMAL HIGH (ref 70–99)

## 2011-05-14 LAB — URINE CULTURE: Culture  Setup Time: 201210180903

## 2011-05-14 NOTE — Procedures (Signed)
EEG NUMBER:  [id number].  This routine EEG was requested in this 35 year old man who was admitted with altered mental status and was found to be minimally responsive. The patient has a history of a suicide attempt in August and is known to abuse substances.  His medications include zolpidem, cyclobenzaprine, and sertraline.  The EEG was done with the patient awake and drowsy.  During periods of maximal wakefulness, he had a low amplitude, well regulated 11-cycle per second posterior dominant rhythm that attenuated with eye opening and was symmetric.  Background activities are characterized by low-amplitude beta activities that were symmetric.  Photic stimulation did not produce a driving response.  Hyperventilation was performed for 3 minutes without significant changes in the tracing.  The patient did fall asleep, as characterized by attenuation of the alpha rhythm, decrease in the muscle artifact, as well as the appearance of slower activities in the delta and theta range.  CLINICAL INTERPRETATION:  This routine electroencephalogram done with the patient awake and drowsy is normal.          ______________________________ Denton Meek, MD    NW:GNFA D:  05/14/2011 15:26:40  T:  05/14/2011 15:50:45  Job #:  [id number]

## 2011-05-17 LAB — GLUCOSE, CAPILLARY
Glucose-Capillary: 264 mg/dL — ABNORMAL HIGH (ref 70–99)
Glucose-Capillary: 238 mg/dL — ABNORMAL HIGH (ref 70–99)
Glucose-Capillary: 200 mg/dL — ABNORMAL HIGH (ref 70–99)
Glucose-Capillary: 177 mg/dL — ABNORMAL HIGH (ref 70–99)

## 2011-05-18 NOTE — Discharge Summary (Signed)
  Wayne [name], Wayne [name] NO.:  [id number]  MEDICAL RECORD NO.:  [id number]  LOCATION:  3733                         FACILITY:  [facility]  PHYSICIAN:  Lonia Blood, M.D.       DATE OF BIRTH:  [birth year]  DATE OF ADMISSION:  05/12/2011 DATE OF DISCHARGE:  05/14/2011                              DISCHARGE SUMMARY   PRIMARY CARE PHYSICIAN:  Corwin Levins, MD  DISCHARGE DIAGNOSES: 1. Episode of altered mental status - most likely due to hypoglycemia     - resolved. 2. Diabetes mellitus type 2. 3. Hypertension. 4. Hyperlipidemia. 5. Obstructive sleep apnea. 6. Depression without evidence of suicidal ideations. 7. Asthma.  DISCHARGE MEDICATIONS: 1. Ambien 10 mg by mouth at bedtime as needed for sleep. 2. Aspirin 81 mg daily. 3. Vitamin D 1000 units daily. 4. Flexeril 5 mg every 8 hours as needed. 5. Glucosamine 1 tablet daily. 6. Haldol 5 mg at bedtime. 7. Humalog 4 units 3 times a day with meals. 8. Hydroxyzine 25 mg 3 times a day as needed. 9. Lantus 45 units at bedtime. 10.Lisinopril 20 mg daily. 11.Metformin 500 mg twice a day. 12.Multivitamin 1 tablet daily. 13.Nexium 40 mg daily. 14.Niacin 500 mg by mouth at bedtime. 15.Omega-3 1 g by mouth 2 capsules daily. 16.Senokot 2 tablets daily as needed. 17.Sertraline 150 mg daily. 18.Symbicort 160/4.5 mcg inhaled 2 puffs twice a day as needed.  CONDITION DISCHARGE:  [name] is in good condition, alert, oriented, in no acute distress.  Temperature 97.4, heart rate 83, respirations 18, blood pressure 125/85, saturation 99% on room air.  Discharge CBG 192.  PROCEDURE THIS ADMISSION: 1. The patient underwent a head CT without contrast on May 12, 2011, which was essentially negative for any acute intracranial     abnormality. 2. EEG negative for epileptiform activity.  HISTORY AND PHYSICAL:  Refer to dictated H and P, which was done by Dr. Kaylyn Layer on May 13, 2011, at 2:39 a.m.  HOSPITAL COURSE:  Ms.  Myler is a 35 year old gentleman with multiple medical problems including diabetes mellitus, type 2, obstructive sleep apnea, depression was admitted from the emergency room after he was brought in confused.  The exact etiology of his confusion was unclear. His symptoms though were highly concerning for a hypoglycemic event. The patient was observed in the hospital overnight and he remained stable.  His confusion corrected.  He had no signs of an acute stroke. He had no clinical signs of seizures in the hospital.  His EEG and head CT were within normal limits.  We decided that based on the patient's presentation that we will go ahead and discontinue his sulfonylurea and leave him on Lantus sliding-scale insulin and metformin.  Otherwise, the patient will follow up with his primary care physician, Dr. Oliver Barre.     Lonia Blood, M.D.     SL/MEDQ  D:  05/16/2011  T:  05/16/2011  Job:  [id number]  cc:   Corwin Levins, MD  Electronically Signed by Lonia Blood M.D. on 05/18/2011 07:41:04 AM

## 2011-05-19 ENCOUNTER — Ambulatory Visit: Payer: PRIVATE HEALTH INSURANCE | Admitting: Internal Medicine

## 2011-05-20 ENCOUNTER — Telehealth: Payer: Self-pay | Admitting: Internal Medicine

## 2011-05-20 ENCOUNTER — Ambulatory Visit: Payer: PRIVATE HEALTH INSURANCE | Admitting: Internal Medicine

## 2011-05-20 NOTE — Telephone Encounter (Signed)
Dismissal Letter sent by Certified Mail 05/21/2011  Dismissal Letter returned unclaimed 06/21/2011  Dismissal Letter resent by 1st Class Mail 06/22/2011

## 2011-05-21 ENCOUNTER — Encounter: Payer: Self-pay | Admitting: Internal Medicine

## 2011-05-21 ENCOUNTER — Ambulatory Visit (INDEPENDENT_AMBULATORY_CARE_PROVIDER_SITE_OTHER): Payer: PRIVATE HEALTH INSURANCE | Admitting: Internal Medicine

## 2011-05-21 VITALS — BP 110/80 | HR 91 | Temp 98.3°F | Ht 74.0 in | Wt 204.4 lb

## 2011-05-21 DIAGNOSIS — M79642 Pain in left hand: Secondary | ICD-10-CM

## 2011-05-21 DIAGNOSIS — K219 Gastro-esophageal reflux disease without esophagitis: Secondary | ICD-10-CM

## 2011-05-21 DIAGNOSIS — I10 Essential (primary) hypertension: Secondary | ICD-10-CM

## 2011-05-21 DIAGNOSIS — E119 Type 2 diabetes mellitus without complications: Secondary | ICD-10-CM

## 2011-05-21 DIAGNOSIS — M542 Cervicalgia: Secondary | ICD-10-CM

## 2011-05-21 DIAGNOSIS — M79609 Pain in unspecified limb: Secondary | ICD-10-CM

## 2011-05-21 MED ORDER — INSULIN GLARGINE 100 UNIT/ML ~~LOC~~ SOLN: 45.0000 [IU] | Freq: Every day | SUBCUTANEOUS | Status: DC

## 2011-05-21 MED ORDER — GLUCOSE BLOOD VI STRP: ORAL_STRIP | Status: AC

## 2011-05-21 MED ORDER — GLIPIZIDE ER 10 MG PO TB24: ORAL_TABLET | ORAL | Status: DC

## 2011-05-21 MED ORDER — ESOMEPRAZOLE MAGNESIUM 40 MG PO CPDR: 40.0000 mg | DELAYED_RELEASE_CAPSULE | Freq: Every day | ORAL | Status: DC

## 2011-05-21 MED ORDER — ONETOUCH ULTRASOFT LANCETS MISC: Status: AC

## 2011-05-21 MED ORDER — NAPROXEN 500 MG PO TBEC: 500.0000 mg | DELAYED_RELEASE_TABLET | Freq: Two times a day (BID) | ORAL | Status: AC

## 2011-05-21 NOTE — Patient Instructions (Signed)
Take all new medications as prescribed Continue all other medications as before You are given the new glucometer and supplies today

## 2011-05-22 ENCOUNTER — Encounter: Payer: Self-pay | Admitting: Internal Medicine

## 2011-05-22 NOTE — Assessment & Plan Note (Signed)
stable overall by hx and exam,  and pt to continue medical treatment as before - needs refill today

## 2011-05-22 NOTE — Progress Notes (Signed)
Subjective:    Patient ID: Wayne [name], male    DOB: 1975/09/17, 35 y.o.   MRN: [id number]  HPI  Here to f/u ; c/o soreness and discomfort with clicking/grinding to the posterior lower neck x 3-4 wks, mild, without change in severity, bowel or bladder change, fever, wt loss,  worsening LE pain/numbness/weakness, gait change or falls.  Worse to flex and extend the neck, better with not doing so.  No trauma or other injury.   Also with c/o soreness similar nature it seems for 3-4 wks to the left first MCP without injury, trauma, fever or swelling, worse to flex and extend the finger, mild, better to rest.  Pt denies chest pain, increased sob or doe, wheezing, orthopnea, PND, increased LE swelling, palpitations, dizziness or syncope.  Pt denies new neurological symptoms such as new headache, or facial or extremity weakness or numbness   Pt denies polydipsia, polyuria.  Has been hospd recently for depression. Denies worsening reflux, dysphagia, abd pain, n/v, bowel change or blood.  Past Medical History  Diagnosis Date  . Depression   . Diabetes mellitus   . Hyperlipidemia   . HTN (hypertension)   . Chronic headaches   . Osteoarthritis   . Kidney stones   . Polysubstance abuse     cocaine, marijuana-quit summer 2010  . Tobacco abuse   . Asthma   . Chest pain   . Lumbar disc disease   . GERD (gastroesophageal reflux disease)   . OSA (obstructive sleep apnea)   . Peripheral neuropathy 02/01/2011   Past Surgical History  Procedure Date  . Knee surgery   . Bunionectomy 06-2009    reports that he quit smoking about 2 months ago. His smoking use included Cigarettes. He has a 3.4 pack-year smoking history. He has quit using smokeless tobacco. He reports that he drinks alcohol. He reports that he uses illicit drugs. family history includes Alzheimer's disease in his maternal grandmother; Asthma in his mother and sister; Breast cancer in his maternal aunt; COPD in his mother; Depression in his father;  Diabetes in his father; Emphysema in his maternal uncle; Heart disease in his mother; Hyperlipidemia in his father; Hypertension in his father; and Stroke in his mother. Allergies  Allergen Reactions  . Iodine     rash  . Nsaids     Abdominal cramping   Current Outpatient Prescriptions on File Prior to Visit  Medication Sig Dispense Refill  . Cholecalciferol (VITAMIN D) 1000 UNITS capsule Take 1,000 Units by mouth daily.        . cyclobenzaprine (FLEXERIL) 5 MG tablet Take 1 tablet (5 mg total) by mouth every 8 (eight) hours as needed for muscle spasms.  60 tablet  1  . fish oil-omega-3 fatty acids 1000 MG capsule Take 2 g by mouth daily.        . Glucosamine 500 MG TABS Take 1 tablet by mouth daily.        . haloperidol (HALDOL) 5 MG tablet Take 5 mg by mouth daily. At bedtime       . hydrOXYzine (VISTARIL) 25 MG capsule Take 25 mg by mouth 3 (three) times daily as needed.        [name] lisinopril (PRINIVIL,ZESTRIL) 20 MG tablet Take 1 tablet (20 mg total) by mouth daily.  90 tablet  3  . metFORMIN (GLUCOPHAGE) 500 MG tablet Take 2 tablets (1,000 mg total) by mouth 2 (two) times daily with a meal.  120 tablet  11  .  Multiple Vitamin (MULTIVITAMIN) capsule Take 1 capsule by mouth daily.        [name] zolpidem (AMBIEN) 10 MG tablet Take 10 mg by mouth at bedtime as needed.         Review of Systems Review of Systems  Constitutional: Negative for diaphoresis and unexpected weight change.  HENT: Negative for drooling and tinnitus.   Eyes: Negative for photophobia and visual disturbance.  Respiratory: Negative for choking and stridor.   Gastrointestinal: Negative for vomiting and blood in stool.  Genitourinary: Negative for hematuria and decreased urine volume.      Objective:   Physical Exam BP 110/80  Pulse 91  Temp(Src) 98.3 F (36.8 C) (Oral)  Ht 6\' 2"  (1.88 m)  Wt 204 lb 6 oz (92.704 kg)  BMI 26.24 kg/m2  SpO2 97% Physical Exam  VS noted Constitutional: Pt appears well-developed and  well-nourished.  HENT: Head: Normocephalic.  Right Ear: External ear normal.  Left Ear: External ear normal.  Eyes: Conjunctivae and EOM are normal. Pupils are equal, round, and reactive to light.  Neck: Normal range of motion. Neck supple.  Cardiovascular: Normal rate and regular rhythm.   Pulmonary/Chest: Effort normal and breath sounds normal.  Neurological: Pt is alert. No cranial nerve deficit. motor/dtr/gait intact Skin: Skin is warm. No erythema.  Neck: FROM, NT, no rash, swelling, spine nontender throughout Left first MCP:  Nontender, nonswelling. FROM    Assessment & Plan:

## 2011-05-22 NOTE — Assessment & Plan Note (Signed)
stable overall by hx and exam, most recent data reviewed with pt, and pt to continue medical treatment as before; gave new glucometer today with supplies, declines dietary counseling  Lab Results  Component Value Date   HGBA1C 10.4* 04/19/2011

## 2011-05-22 NOTE — Assessment & Plan Note (Signed)
Mild to mod, for nsaid course,  Most likely relate to underlying DJD, exam o/w benign, to f/u any worsening symptoms or concerns

## 2011-05-22 NOTE — Assessment & Plan Note (Addendum)
Mild to mod, for nsaid course,  Most likely relate to underlying cspine DJD or DDD, exam o/w benign, to f/u any worsening symptoms or concerns

## 2011-05-22 NOTE — Assessment & Plan Note (Signed)
stable overall by hx and exam, most recent data reviewed with pt, and pt to continue medical treatment as before  BP Readings from Last 3 Encounters:  05/21/11 110/80  04/27/11 130/92  04/19/11 158/100

## 2011-05-24 ENCOUNTER — Telehealth: Payer: Self-pay | Admitting: Pulmonary Disease

## 2011-05-24 NOTE — Telephone Encounter (Signed)
ATC pt received a message stating could not leave a message due mailbox was full wcb

## 2011-05-25 ENCOUNTER — Other Ambulatory Visit: Payer: Self-pay | Admitting: Pulmonary Disease

## 2011-05-25 DIAGNOSIS — G4733 Obstructive sleep apnea (adult) (pediatric): Secondary | ICD-10-CM

## 2011-05-25 NOTE — Telephone Encounter (Signed)
[email or web address].Wayne [name], CMA  

## 2011-05-25 NOTE — Telephone Encounter (Signed)
ATC pt and line was busy, [facility] tomorrow

## 2011-05-25 NOTE — Telephone Encounter (Signed)
ATC and line is to a fax machine.

## 2011-05-25 NOTE — Telephone Encounter (Signed)
I spoke with the pt and he reviewed the material given by Dr. Shelle Iron and he wants to try the CPAP again even though he had tolerance issues in the past. I advised that I will forward the message to Dr. Shelle Iron to see if he needs the pt to come in for an appt to discuss this. Please advise.Carron Curie, CMA

## 2011-05-25 NOTE — Telephone Encounter (Signed)
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

## 2011-05-26 NOTE — Telephone Encounter (Signed)
I spoke with pt and he is aware of [facility] response. Pt is scheduled to see [facility] 12/3 at 11/15 for 5 week f/u.[name] Pt aware order has been sent to [facility]

## 2011-06-28 ENCOUNTER — Ambulatory Visit: Payer: PRIVATE HEALTH INSURANCE | Admitting: Pulmonary Disease

## 2011-07-14 ENCOUNTER — Ambulatory Visit: Payer: PRIVATE HEALTH INSURANCE | Admitting: Pulmonary Disease

## 2011-07-24 ENCOUNTER — Other Ambulatory Visit: Payer: Self-pay

## 2011-07-24 ENCOUNTER — Inpatient Hospital Stay (HOSPITAL_COMMUNITY)
Admission: EM | Admit: 2011-07-24 | Discharge: 2011-07-30 | DRG: 917 | Disposition: A | Discharge status: Other Acute Inpatient Hospital | Payer: Self-pay | Attending: Internal Medicine | Admitting: Internal Medicine

## 2011-07-24 DIAGNOSIS — F3289 Other specified depressive episodes: Secondary | ICD-10-CM | POA: Diagnosis present

## 2011-07-24 DIAGNOSIS — T434X1A Poisoning by butyrophenone and thiothixene neuroleptics, accidental (unintentional), initial encounter: Secondary | ICD-10-CM | POA: Diagnosis present

## 2011-07-24 DIAGNOSIS — T43502A Poisoning by unspecified antipsychotics and neuroleptics, intentional self-harm, initial encounter: Secondary | ICD-10-CM | POA: Diagnosis present

## 2011-07-24 DIAGNOSIS — T383X1A Poisoning by insulin and oral hypoglycemic [antidiabetic] drugs, accidental (unintentional), initial encounter: Principal | ICD-10-CM | POA: Diagnosis present

## 2011-07-24 DIAGNOSIS — Z794 Long term (current) use of insulin: Secondary | ICD-10-CM

## 2011-07-24 DIAGNOSIS — T43294A Poisoning by other antidepressants, undetermined, initial encounter: Secondary | ICD-10-CM | POA: Diagnosis present

## 2011-07-24 DIAGNOSIS — F1910 Other psychoactive substance abuse, uncomplicated: Secondary | ICD-10-CM | POA: Diagnosis present

## 2011-07-24 DIAGNOSIS — J45909 Unspecified asthma, uncomplicated: Secondary | ICD-10-CM | POA: Diagnosis present

## 2011-07-24 DIAGNOSIS — G92 Toxic encephalopathy: Secondary | ICD-10-CM | POA: Diagnosis present

## 2011-07-24 DIAGNOSIS — D649 Anemia, unspecified: Secondary | ICD-10-CM | POA: Diagnosis present

## 2011-07-24 DIAGNOSIS — E088 Diabetes mellitus due to underlying condition with unspecified complications: Secondary | ICD-10-CM | POA: Diagnosis present

## 2011-07-24 DIAGNOSIS — N179 Acute kidney failure, unspecified: Secondary | ICD-10-CM | POA: Diagnosis present

## 2011-07-24 DIAGNOSIS — T438X2A Poisoning by other psychotropic drugs, intentional self-harm, initial encounter: Secondary | ICD-10-CM | POA: Diagnosis present

## 2011-07-24 DIAGNOSIS — R7989 Other specified abnormal findings of blood chemistry: Secondary | ICD-10-CM | POA: Diagnosis present

## 2011-07-24 DIAGNOSIS — I10 Essential (primary) hypertension: Secondary | ICD-10-CM | POA: Insufficient documentation

## 2011-07-24 DIAGNOSIS — R739 Hyperglycemia, unspecified: Secondary | ICD-10-CM

## 2011-07-24 DIAGNOSIS — E349 Endocrine disorder, unspecified: Secondary | ICD-10-CM | POA: Diagnosis present

## 2011-07-24 DIAGNOSIS — D638 Anemia in other chronic diseases classified elsewhere: Secondary | ICD-10-CM | POA: Diagnosis present

## 2011-07-24 DIAGNOSIS — T50992A Poisoning by other drugs, medicaments and biological substances, intentional self-harm, initial encounter: Secondary | ICD-10-CM | POA: Diagnosis present

## 2011-07-24 DIAGNOSIS — G929 Unspecified toxic encephalopathy: Secondary | ICD-10-CM | POA: Diagnosis present

## 2011-07-24 DIAGNOSIS — T50902A Poisoning by unspecified drugs, medicaments and biological substances, intentional self-harm, initial encounter: Secondary | ICD-10-CM

## 2011-07-24 DIAGNOSIS — F339 Major depressive disorder, recurrent, unspecified: Secondary | ICD-10-CM | POA: Diagnosis present

## 2011-07-24 DIAGNOSIS — G4730 Sleep apnea, unspecified: Secondary | ICD-10-CM | POA: Diagnosis present

## 2011-07-24 DIAGNOSIS — T50901A Poisoning by unspecified drugs, medicaments and biological substances, accidental (unintentional), initial encounter: Secondary | ICD-10-CM | POA: Diagnosis present

## 2011-07-24 DIAGNOSIS — F1410 Cocaine abuse, uncomplicated: Secondary | ICD-10-CM | POA: Diagnosis present

## 2011-07-24 DIAGNOSIS — R12 Heartburn: Secondary | ICD-10-CM | POA: Diagnosis present

## 2011-07-24 DIAGNOSIS — F17200 Nicotine dependence, unspecified, uncomplicated: Secondary | ICD-10-CM | POA: Diagnosis present

## 2011-07-24 DIAGNOSIS — F329 Major depressive disorder, single episode, unspecified: Secondary | ICD-10-CM | POA: Diagnosis present

## 2011-07-24 DIAGNOSIS — R000 Tachycardia, unspecified: Secondary | ICD-10-CM | POA: Diagnosis present

## 2011-07-24 DIAGNOSIS — E871 Hypo-osmolality and hyponatremia: Secondary | ICD-10-CM | POA: Diagnosis present

## 2011-07-24 DIAGNOSIS — E1142 Type 2 diabetes mellitus with diabetic polyneuropathy: Secondary | ICD-10-CM | POA: Diagnosis present

## 2011-07-24 DIAGNOSIS — E1149 Type 2 diabetes mellitus with other diabetic neurological complication: Secondary | ICD-10-CM | POA: Diagnosis present

## 2011-07-24 DIAGNOSIS — R45851 Suicidal ideations: Secondary | ICD-10-CM

## 2011-07-24 LAB — GLUCOSE, CAPILLARY
Glucose-Capillary: 538 mg/dL — ABNORMAL HIGH (ref 70–99)
Glucose-Capillary: 530 mg/dL — ABNORMAL HIGH (ref 70–99)
Glucose-Capillary: 513 mg/dL — ABNORMAL HIGH (ref 70–99)
Glucose-Capillary: 439 mg/dL — ABNORMAL HIGH (ref 70–99)
Glucose-Capillary: 240 mg/dL — ABNORMAL HIGH (ref 70–99)
Glucose-Capillary: 301 mg/dL — ABNORMAL HIGH (ref 70–99)
Glucose-Capillary: 125 mg/dL — ABNORMAL HIGH (ref 70–99)
Glucose-Capillary: 85 mg/dL (ref 70–99)
Glucose-Capillary: 132 mg/dL — ABNORMAL HIGH (ref 70–99)

## 2011-07-24 LAB — COMPREHENSIVE METABOLIC PANEL
BUN: 22 mg/dL (ref 6–23)
CO2: 25 mEq/L (ref 19–32)
Calcium: 9.6 mg/dL (ref 8.4–10.5)
Chloride: 95 mEq/L — ABNORMAL LOW (ref 96–112)
Creatinine, Ser: 1.52 mg/dL — ABNORMAL HIGH (ref 0.50–1.35)
GFR calc Af Amer: 67 mL/min — ABNORMAL LOW (ref >90)
GFR calc non Af Amer: 58 mL/min — ABNORMAL LOW (ref >90)
Glucose, Bld: 499 mg/dL — ABNORMAL HIGH (ref 70–99)
Total Bilirubin: 0.3 mg/dL (ref 0.3–1.2)

## 2011-07-24 LAB — ACETAMINOPHEN LEVEL: Acetaminophen (Tylenol), Serum: <15 ug/mL (ref 10–30)

## 2011-07-24 LAB — CARDIAC PANEL(CRET KIN+CKTOT+MB+TROPI)
CK, MB: 1.9 ng/mL (ref 0.3–4.0)
Relative Index: 1 (ref 0.0–2.5)
Troponin I: <0.3 ng/mL (ref <0.30)

## 2011-07-24 LAB — CBC
HCT: 36.2 % — ABNORMAL LOW (ref 39.0–52.0)
MCH: 30.8 pg (ref 26.0–34.0)
MCV: 87.9 fL (ref 78.0–100.0)
RBC: 4.12 MIL/uL — ABNORMAL LOW (ref 4.22–5.81)
WBC: 6.9 10*3/uL (ref 4.0–10.5)

## 2011-07-24 LAB — ETHANOL: Alcohol, Ethyl (B): <11 mg/dL (ref 0–11)

## 2011-07-24 LAB — RAPID URINE DRUG SCREEN, HOSP PERFORMED
Cocaine: POSITIVE — AB
Opiates: NOT DETECTED
Tetrahydrocannabinol: NOT DETECTED

## 2011-07-24 MED ORDER — SODIUM CHLORIDE 0.9 % IV SOLN: INTRAVENOUS | Status: DC

## 2011-07-24 MED ORDER — ONDANSETRON HCL 4 MG PO TABS: 4.0000 mg | ORAL_TABLET | Freq: Four times a day (QID) | ORAL | Status: DC | PRN

## 2011-07-24 MED ORDER — SODIUM CHLORIDE 0.9 % IV BOLUS (SEPSIS)
1000.0000 mL | Freq: Once | INTRAVENOUS | Status: AC
  Administered 2011-07-24: 1000 mL via INTRAVENOUS

## 2011-07-24 MED ORDER — INSULIN REGULAR BOLUS VIA INFUSION
0.0000 [IU] | Freq: Three times a day (TID) | INTRAVENOUS | Status: DC
  Filled 2011-07-24 (×3): qty 10

## 2011-07-24 MED ORDER — OXYCODONE HCL 5 MG PO TABS
5.0000 mg | ORAL_TABLET | ORAL | Status: DC | PRN
  Administered 2011-07-29 (×2): 5 mg via ORAL
  Filled 2011-07-24 (×2): qty 1

## 2011-07-24 MED ORDER — SODIUM CHLORIDE 0.9 % IV SOLN
INTRAVENOUS | Status: DC
  Administered 2011-07-24: 22:00:00 via INTRAVENOUS
  Filled 2011-07-24 (×2): qty 1

## 2011-07-24 MED ORDER — ACETAMINOPHEN 325 MG PO TABS
650.0000 mg | ORAL_TABLET | Freq: Four times a day (QID) | ORAL | Status: DC | PRN
  Administered 2011-07-28 – 2011-07-30 (×4): 650 mg via ORAL
  Filled 2011-07-24 (×4): qty 2

## 2011-07-24 MED ORDER — SODIUM CHLORIDE 0.9 % IV SOLN
INTRAVENOUS | Status: DC
  Administered 2011-07-25 – 2011-07-27 (×5): via INTRAVENOUS

## 2011-07-24 MED ORDER — DEXTROSE-NACL 5-0.45 % IV SOLN
INTRAVENOUS | Status: DC
  Administered 2011-07-24 (×2): via INTRAVENOUS

## 2011-07-24 MED ORDER — ONDANSETRON HCL 4 MG/2ML IJ SOLN: 4.0000 mg | Freq: Four times a day (QID) | INTRAMUSCULAR | Status: DC | PRN

## 2011-07-24 MED ORDER — DEXTROSE 50 % IV SOLN: 25.0000 mL | INTRAVENOUS | Status: DC | PRN

## 2011-07-24 MED ORDER — ACETAMINOPHEN 650 MG RE SUPP: 650.0000 mg | Freq: Four times a day (QID) | RECTAL | Status: DC | PRN

## 2011-07-24 MED ORDER — DEXTROSE-NACL 5-0.45 % IV SOLN: INTRAVENOUS | Status: DC

## 2011-07-24 MED ORDER — INSULIN REGULAR BOLUS VIA INFUSION
0.0000 [IU] | Freq: Three times a day (TID) | INTRAVENOUS | Status: DC
  Administered 2011-07-24: 4.7 [IU] via INTRAVENOUS
  Filled 2011-07-24 (×6): qty 10

## 2011-07-24 MED ORDER — ENOXAPARIN SODIUM 40 MG/0.4ML ~~LOC~~ SOLN
40.0000 mg | SUBCUTANEOUS | Status: DC
  Administered 2011-07-25 – 2011-07-29 (×6): 40 mg via SUBCUTANEOUS
  Filled 2011-07-24 (×7): qty 0.4

## 2011-07-24 MED ORDER — SODIUM CHLORIDE 0.9 % IV SOLN
INTRAVENOUS | Status: DC
  Filled 2011-07-24: qty 1

## 2011-07-24 NOTE — ED Notes (Signed)
Blood sugar via glucometer 526. Dr. Patria Mane notified.

## 2011-07-24 NOTE — ED Notes (Signed)
New bed assignment. Attempt to call report to CCU unsuccessful. Receiving RN will return call to ED RN when available for report.

## 2011-07-24 NOTE — ED Notes (Signed)
Attempted to call report to CCU 

## 2011-07-24 NOTE — ED Provider Notes (Addendum)
History     CSN: [id number]  Arrival date & time 07/24/11  0404   First MD Initiated Contact with Patient 07/24/11 [phone]      Chief Complaint  Patient presents with  . Drug Overdose    Suicide     The history is provided by the patient and the EMS personnel. The history is limited by the condition of the patient.   the patient and the EMS report intentional overdose in attempt to try and kill himself.  He took approximately 1500 mg of metformin as well as an unknown amount of his Haldol and his Zoloft.  He reports his occurred approximately 2 hours ago but he is drowsy and unable to provide additional information.  He denies alcohol abuse.  Past Medical History  Diagnosis Date  . Depression   . Diabetes mellitus   . Hyperlipidemia   . HTN (hypertension)   . Chronic headaches   . Osteoarthritis   . Kidney stones   . Polysubstance abuse     cocaine, marijuana-quit summer 2010  . Tobacco abuse   . Asthma   . Chest pain   . Lumbar disc disease   . GERD (gastroesophageal reflux disease)   . OSA (obstructive sleep apnea)   . Peripheral neuropathy 02/01/2011    Past Surgical History  Procedure Date  . Knee surgery   . Bunionectomy 06-2009    Family History  Problem Relation Age of Onset  . Heart disease Mother   . Diabetes Father   . Hyperlipidemia Father   . Emphysema Maternal Uncle   . Asthma Sister   . Asthma Mother   . Stroke Mother   . Hypertension Father   . Alzheimer's disease Maternal Grandmother   . Depression Father   . Breast cancer Maternal Aunt   . COPD Mother     maternal un    History  Substance Use Topics  . Smoking status: Former Smoker -- 0.2 packs/day for 17 years    Types: Cigarettes    Quit date: 02/24/2011  . Smokeless tobacco: Former Neurosurgeon  . Alcohol Use: Yes     socially      Review of Systems  Unable to perform ROS   Allergies  Iodine and Nsaids  Home Medications   Current Outpatient Rx  Name Route Sig Dispense Refill    . VITAMIN D 1000 UNITS PO CAPS Oral Take 1,000 Units by mouth daily.      . CYCLOBENZAPRINE HCL 5 MG PO TABS Oral Take 1 tablet (5 mg total) by mouth every 8 (eight) hours as needed for muscle spasms. 60 tablet 1  . ESOMEPRAZOLE MAGNESIUM 40 MG PO CPDR Oral Take 1 capsule (40 mg total) by mouth daily. 90 capsule 3  . OMEGA-3 FATTY ACIDS 1000 MG PO CAPS Oral Take 2 g by mouth daily.      [name] GLIPIZIDE ER 10 MG PO TB24  1 tab per day for blood sugar more than 200 30 tablet 11  . GLUCOSAMINE 500 MG PO TABS Oral Take 1 tablet by mouth daily.      [name] GLUCOSE BLOOD VI STRP  PRN 100 each 5  . HALOPERIDOL 5 MG PO TABS Oral Take 5 mg by mouth daily. At bedtime     . HYDROXYZINE PAMOATE 25 MG PO CAPS Oral Take 25 mg by mouth 3 (three) times daily as needed.      . INSULIN GLARGINE 100 UNIT/ML [place] SOLN Subcutaneous Inject 45 Units into the  skin at bedtime.      . INSULIN LISPRO (HUMAN) 100 UNIT/ML [place] SOLN Subcutaneous Inject 2 Units into the skin 3 (three) times daily before meals.      Letta Pate ULTRASOFT LANCETS MISC  PRN 100 each 5  . LISINOPRIL 20 MG PO TABS Oral Take 1 tablet (20 mg total) by mouth daily. 90 tablet 3  . METFORMIN HCL 500 MG PO TABS Oral Take 2 tablets (1,000 mg total) by mouth 2 (two) times daily with a meal. 120 tablet 11  . MULTIVITAMINS PO CAPS Oral Take 1 capsule by mouth daily.      [name] NAPROXEN 500 MG PO TBEC Oral Take 1 tablet (500 mg total) by mouth 2 (two) times daily with a meal. 60 tablet 5  . SERTRALINE HCL 100 MG PO TABS Oral Take 100 mg by mouth 2 (two) times daily.      [name] ZOLPIDEM TARTRATE 10 MG PO TABS Oral Take 10 mg by mouth at bedtime as needed.        BP 101/62  Pulse 85  Temp(Src) 97.8 F (36.6 C) (Oral)  Resp 16  SpO2 99%  Physical Exam  Nursing note and vitals reviewed. Constitutional: He appears well-developed and well-nourished.  HENT:  Head: Normocephalic and atraumatic.  Eyes: EOM are normal.  Neck: Normal range of motion.  Cardiovascular: Normal  rate, regular rhythm, normal heart sounds and intact distal pulses.   Pulmonary/Chest: Effort normal and breath sounds normal. No respiratory distress.  Abdominal: Soft. He exhibits no distension. There is no tenderness.  Musculoskeletal: Normal range of motion.  Neurological: He is alert.       arousable to palpation and pain  Skin: Skin is warm and dry.  Psychiatric: He has a normal mood and affect. Judgment normal.    ED Course  Procedures (including critical care time)   Date: 07/24/2011  Rate: 74  Rhythm: normal sinus rhythm  QRS Axis: normal  Intervals: normal  ST/T Wave abnormalities: nonspecific ST changes  Conduction Disutrbances:none  Narrative Interpretation: diffuse ST elevation in all leads, may represent pericarditis vs early repol  Old EKG Reviewed: No significant changes noted     Labs Reviewed  CBC - Abnormal; Notable for the following:    RBC 4.12 (*)    Hemoglobin 12.7 (*)    HCT 36.2 (*)    All other components within normal limits  COMPREHENSIVE METABOLIC PANEL - Abnormal; Notable for the following:    Sodium 133 (*)    Chloride 95 (*)    Glucose, Bld 499 (*)    Creatinine, Ser 1.52 (*)    Alkaline Phosphatase 222 (*)    GFR calc non Af Amer 58 (*)    GFR calc Af Amer 67 (*)    All other components within normal limits  URINE RAPID DRUG SCREEN (HOSP PERFORMED) - Abnormal; Notable for the following:    Cocaine POSITIVE (*)    All other components within normal limits  SALICYLATE LEVEL - Abnormal; Notable for the following:    Salicylate Lvl <2.0 (*)    All other components within normal limits  GLUCOSE, CAPILLARY - Abnormal; Notable for the following:    Glucose-Capillary 538 (*)    All other components within normal limits  GLUCOSE, CAPILLARY - Abnormal; Notable for the following:    Glucose-Capillary 530 (*)    All other components within normal limits  GLUCOSE, CAPILLARY - Abnormal; Notable for the following:    Glucose-Capillary 513 (*)  All other components within normal limits  ETHANOL  ACETAMINOPHEN LEVEL  POCT CBG MONITORING  POCT CBG MONITORING   No results found.   1. Intentional drug overdose   2. Hyperglycemia       MDM  Pt will require admission for observation given multidrug overdose. Hyperglycemia treated with fluids and insulin gtt. [facility] to admit        Lyanne Co, MD 07/24/11 [id number]  Lyanne Co, MD 07/24/11 [phone]

## 2011-07-24 NOTE — ED Notes (Signed)
Attempt to call report to CCU unsuccessful. Was informed "let us call you back."

## 2011-07-24 NOTE — ED Notes (Signed)
Patient laying on stretcher with eyes closed. Patient arouses easily. Sitter at bedside. IV patent, infusing without IV related complications. D5 1/2NS bag empty; new bag hung. Insulin gtt infusing.Patient on Glucostabilizer.

## 2011-07-24 NOTE — ED Notes (Signed)
EMS called to home.  Found patient in car alert and oriented x3 Patient states he has taken three days of medication at one time Admits to suicide. Patient admits to takeing Bupropion SR, Sertraline, Haloperidol, Metformin EMS states that patient has became more lethargic since picking him up

## 2011-07-24 NOTE — H&P (Signed)
PCP:   Oliver Barre, MD, MD   Chief Complaint:  Brought in for drug overdose.  HPI: 35 year old male with h/o uncontrolled diabetes, depression , asthma, gerd, Hypertension was brought in by [facility], for altered mental status secondary to metformin, haldol adn zoloft overdose. Pt appeared very lethargic and did not provide detailed history. He did answer simple questions that he took medications to kill himself and he knows that he is in the hospital. He denied any pain, nausea or vomiting.   Review of Systems: Very limited secondary to lethargy.  Past Medical History: Past Medical History  Diagnosis Date  . Depression   . Diabetes mellitus   . Hyperlipidemia   . HTN (hypertension)   . Chronic headaches   . Osteoarthritis   . Kidney stones   . Polysubstance abuse     cocaine, marijuana-quit summer 2010  . Tobacco abuse   . Asthma   . Chest pain   . Lumbar disc disease   . GERD (gastroesophageal reflux disease)   . OSA (obstructive sleep apnea)   . Peripheral neuropathy 02/01/2011   Past Surgical History  Procedure Date  . Knee surgery   . Bunionectomy 06-2009    Medications: Prior to Admission medications   Medication Sig Start Date End Date Taking? Authorizing Provider  Cholecalciferol (VITAMIN D) 1000 UNITS capsule Take 1,000 Units by mouth daily.      Historical Provider, MD  cyclobenzaprine (FLEXERIL) 5 MG tablet Take 1 tablet (5 mg total) by mouth every 8 (eight) hours as needed for muscle spasms. 03/16/11 03/15/12  Oliver Barre, MD  esomeprazole (NEXIUM) 40 MG capsule Take 1 capsule (40 mg total) by mouth daily. 05/21/11 05/20/12  Oliver Barre, MD  fish oil-omega-3 fatty acids 1000 MG capsule Take 2 g by mouth daily.      Historical Provider, MD  glipiZIDE (GLUCOTROL XL) 10 MG 24 hr tablet 1 tab per day for blood sugar more than 200 05/21/11   Oliver Barre, MD  Glucosamine 500 MG TABS Take 1 tablet by mouth daily.      Historical Provider, MD  glucose blood (ONE TOUCH ULTRA TEST)  test strip PRN 05/21/11 05/20/12  Oliver Barre, MD  haloperidol (HALDOL) 5 MG tablet Take 5 mg by mouth daily. At bedtime     Historical Provider, MD  hydrOXYzine (VISTARIL) 25 MG capsule Take 25 mg by mouth 3 (three) times daily as needed.      Historical Provider, MD  insulin glargine (LANTUS) 100 UNIT/ML injection Inject 45 Units into the skin at bedtime.      Historical Provider, MD  insulin lispro (HUMALOG) 100 UNIT/ML injection Inject 2 Units into the skin 3 (three) times daily before meals.      Historical Provider, MD  Lancets [facility] ULTRASOFT) lancets PRN 05/21/11 05/20/12  Oliver Barre, MD  lisinopril (PRINIVIL,ZESTRIL) 20 MG tablet Take 1 tablet (20 mg total) by mouth daily. 04/19/11 04/18/12  Oliver Barre, MD  metFORMIN (GLUCOPHAGE) 500 MG tablet Take 2 tablets (1,000 mg total) by mouth 2 (two) times daily with a meal. 03/16/11   Oliver Barre, MD  Multiple Vitamin (MULTIVITAMIN) capsule Take 1 capsule by mouth daily.      Historical Provider, MD  naproxen (EC NAPROSYN) 500 MG EC tablet Take 1 tablet (500 mg total) by mouth 2 (two) times daily with a meal. 05/21/11 05/20/12  Oliver Barre, MD  sertraline (ZOLOFT) 100 MG tablet Take 100 mg by mouth 2 (two) times daily.  Historical Provider, MD  zolpidem (AMBIEN) 10 MG tablet Take 10 mg by mouth at bedtime as needed.      Historical Provider, MD    Allergies:   Allergies  Allergen Reactions  . Iodine     rash  . Nsaids     Abdominal cramping    Social History:  reports that he quit smoking about 4 months ago. His smoking use included Cigarettes. He has a 3.4 pack-year smoking history. He has quit using smokeless tobacco. He reports that he drinks alcohol. He reports that he uses illicit drugs.   Family History: Family History  Problem Relation Age of Onset  . Heart disease Mother   . Diabetes Father   . Hyperlipidemia Father   . Emphysema Maternal Uncle   . Asthma Sister   . Asthma Mother   . Stroke Mother   . Hypertension  Father   . Alzheimer's disease Maternal Grandmother   . Depression Father   . Breast cancer Maternal Aunt   . COPD Mother     maternal un    Physical Exam: Filed Vitals:   07/24/11 0801  BP: 101/62  Pulse: 85  Temp: 97.8 F (36.6 C)  TempSrc: Oral  Resp: 16  SpO2: 99%   Constitutional: Vital signs reviewed.  Patient is a well-developed and well-nourished  in no acute distress and cooperative with exam. lethargic.  Head: Normocephalic and atraumatic Mouth: no erythema or exudates, MMM Eyes: PERRL, EOMI, conjunctivae normal, No scleral icterus.  Neck: Supple, Trachea midline normal ROM, No JVD, mass, thyromegaly, or carotid bruit present.  Cardiovascular: RRR, S1 normal, S2 normal, no MRG, pulses symmetric and intact bilaterally Pulmonary/Chest: CTAB, no wheezes, rales, or rhonchi Abdominal: Soft. Tenderness in the epigastric area.  non-distended, bowel sounds are normal, no masses, organomegaly, or guarding present.  Neurological: lethargic , detailed exam could not be performed as he was lethargic.  Skin: Warm, dry and intact. No rash, cyanosis, or clubbing.       Labs on Admission:   [facility] 07/24/11 0456  NA 133*  K 4.2  CL 95*  CO2 25  GLUCOSE 499*  BUN 22  CREATININE 1.52*  CALCIUM 9.6  MG --  PHOS --    Basename 07/24/11 0456  AST 30  ALT 22  ALKPHOS 222*  BILITOT 0.3  PROT 7.6  ALBUMIN 4.2   No results found for this basename: LIPASE:2,AMYLASE:2 in the last 72 hours  Basename 07/24/11 0456  WBC 6.9  NEUTROABS --  HGB 12.7*  HCT 36.2*  MCV 87.9  PLT 167   No results found for this basename: CKTOTAL:3,CKMB:3,CKMBINDEX:3,TROPONINI:3 in the last 72 hours No results found for this basename: TSH,T4TOTAL,FREET3,T3FREE,THYROIDAB in the last 72 hours No results found for this basename: VITAMINB12:2,FOLATE:2,FERRITIN:2,TIBC:2,IRON:2,RETICCTPCT:2 in the last 72 hours  Radiological Exams on Admission: No results found.  Assessment/Plan Present on  Admission:  .Drug overdose: Overdose of metformin, haldol and zoloft, pt stated he took 3 days worth of medications . Currently his airway is patent. He weill be monitored for 48 hours in step down unit.  .Metabolic encephalopathy: secondary to drug overdose.  .Diabetes mellitus due to underlying condition with complication: on admission, his  CBG 's are in 500's , currently he is on iv insulin. hgba1c is ordered.   Marland KitchenDEPRESSION: Hold all antidepressive medications.  Psychiatry consult called.  .Asthma: stable continue with bronchodilator therapy.  Marland KitchenHeartburn: will start him on po protonix.  .Acute renal failure: most likely secondary to dehydration. Will hydrate  him and check BMP in am.  .Hyponatremia: secondary to dehydration and hyperglycemia. .Anemia: normocytic. Anemia panel will be ordered.   Code status could not be discussed at this time secondary to his  Altered mental status.    Time spent on this patient including examination and decision-making process: 45 minutes.  Dejanee Thibeaux [phone] 07/24/2011, 9:39 AM

## 2011-07-25 ENCOUNTER — Other Ambulatory Visit: Payer: Self-pay

## 2011-07-25 DIAGNOSIS — F3289 Other specified depressive episodes: Secondary | ICD-10-CM

## 2011-07-25 DIAGNOSIS — F329 Major depressive disorder, single episode, unspecified: Secondary | ICD-10-CM

## 2011-07-25 DIAGNOSIS — F1910 Other psychoactive substance abuse, uncomplicated: Secondary | ICD-10-CM

## 2011-07-25 LAB — COMPREHENSIVE METABOLIC PANEL
ALT: 21 U/L (ref 0–53)
AST: 20 U/L (ref 0–37)
Alkaline Phosphatase: 87 U/L (ref 39–117)
Calcium: 8.9 mg/dL (ref 8.4–10.5)
GFR calc Af Amer: >90 mL/min (ref >90)
GFR calc non Af Amer: 84 mL/min — ABNORMAL LOW (ref >90)
Glucose, Bld: 240 mg/dL — ABNORMAL HIGH (ref 70–99)
Total Bilirubin: 0.3 mg/dL (ref 0.3–1.2)

## 2011-07-25 LAB — BASIC METABOLIC PANEL
Chloride: 104 mEq/L (ref 96–112)
GFR calc Af Amer: 65 mL/min — ABNORMAL LOW (ref >90)
GFR calc non Af Amer: 56 mL/min — ABNORMAL LOW (ref >90)
Glucose, Bld: 126 mg/dL — ABNORMAL HIGH (ref 70–99)
Potassium: 3.6 mEq/L (ref 3.5–5.1)
Sodium: 137 mEq/L (ref 135–145)

## 2011-07-25 LAB — CARDIAC PANEL(CRET KIN+CKTOT+MB+TROPI)
CK, MB: 1.7 ng/mL (ref 0.3–4.0)
CK, MB: 1.7 ng/mL (ref 0.3–4.0)
Troponin I: <0.3 ng/mL (ref <0.30)

## 2011-07-25 LAB — PROTIME-INR: Prothrombin Time: 13.1 seconds (ref 11.6–15.2)

## 2011-07-25 LAB — CBC
Hemoglobin: 12.5 g/dL — ABNORMAL LOW (ref 13.0–17.0)
MCH: 32.1 pg (ref 26.0–34.0)
MCHC: 36.2 g/dL — ABNORMAL HIGH (ref 30.0–36.0)
RDW: 13.2 % (ref 11.5–15.5)

## 2011-07-25 LAB — HEMOGLOBIN A1C: Hgb A1c MFr Bld: 10.9 % — ABNORMAL HIGH (ref <5.7)

## 2011-07-25 LAB — GLUCOSE, CAPILLARY
Glucose-Capillary: 255 mg/dL — ABNORMAL HIGH (ref 70–99)
Glucose-Capillary: 126 mg/dL — ABNORMAL HIGH (ref 70–99)
Glucose-Capillary: 177 mg/dL — ABNORMAL HIGH (ref 70–99)
Glucose-Capillary: 194 mg/dL — ABNORMAL HIGH (ref 70–99)
Glucose-Capillary: 211 mg/dL — ABNORMAL HIGH (ref 70–99)
Glucose-Capillary: 226 mg/dL — ABNORMAL HIGH (ref 70–99)

## 2011-07-25 MED ORDER — INSULIN GLARGINE 100 UNIT/ML ~~LOC~~ SOLN
10.0000 [IU] | Freq: Every day | SUBCUTANEOUS | Status: DC
  Administered 2011-07-25: 10 [IU] via SUBCUTANEOUS

## 2011-07-25 MED ORDER — INSULIN GLARGINE 100 UNIT/ML ~~LOC~~ SOLN
10.0000 [IU] | Freq: Every day | SUBCUTANEOUS | Status: DC
  Filled 2011-07-25: qty 3

## 2011-07-25 MED ORDER — INSULIN ASPART 100 UNIT/ML ~~LOC~~ SOLN
0.0000 [IU] | SUBCUTANEOUS | Status: DC
  Administered 2011-07-25: 2 [IU] via SUBCUTANEOUS
  Administered 2011-07-25 (×3): 5 [IU] via SUBCUTANEOUS
  Administered 2011-07-25: 2 [IU] via SUBCUTANEOUS
  Administered 2011-07-25: 7 [IU] via SUBCUTANEOUS
  Administered 2011-07-25: 5 [IU] via SUBCUTANEOUS
  Administered 2011-07-26: 2 [IU] via SUBCUTANEOUS
  Administered 2011-07-26: 3 [IU] via SUBCUTANEOUS
  Administered 2011-07-26: 2 [IU] via SUBCUTANEOUS
  Filled 2011-07-25: qty 3

## 2011-07-25 MED ORDER — INSULIN ASPART 100 UNIT/ML ~~LOC~~ SOLN
3.0000 [IU] | Freq: Three times a day (TID) | SUBCUTANEOUS | Status: DC
  Administered 2011-07-25 – 2011-07-28 (×10): 3 [IU] via SUBCUTANEOUS
  Filled 2011-07-25: qty 3

## 2011-07-25 MED ORDER — INSULIN GLARGINE 100 UNIT/ML ~~LOC~~ SOLN
25.0000 [IU] | Freq: Every day | SUBCUTANEOUS | Status: DC
  Administered 2011-07-25 – 2011-07-29 (×5): 25 [IU] via SUBCUTANEOUS

## 2011-07-25 NOTE — Progress Notes (Signed)
Subjective: Pt denies any new complaints.   Objective: Weight change:   Intake/Output Summary (Last 24 hours) at 07/25/11 1052 Last data filed at 07/25/11 0900  Gross per 24 hour  Intake 2183.75 ml  Output    950 ml  Net 1233.75 ml   Pt alert afebrile comfortable cvs s1 and s2 heard Lungs clear abd soft mild tenderness in the epigastric area. Ext: no edema.   Lab Results: Results for orders placed during the hospital encounter of 07/24/11 (from the past 24 hour(s))  GLUCOSE, CAPILLARY     Status: Abnormal   Collection Time   07/24/11  5:11 PM      Component Value Range   Glucose-Capillary 252 (*) 70 - 99 (mg/dL)  GLUCOSE, CAPILLARY     Status: Abnormal   Collection Time   07/24/11  6:32 PM      Component Value Range   Glucose-Capillary 301 (*) 70 - 99 (mg/dL)  GLUCOSE, CAPILLARY     Status: Abnormal   Collection Time   07/24/11  7:42 PM      Component Value Range   Glucose-Capillary 125 (*) 70 - 99 (mg/dL)  GLUCOSE, CAPILLARY     Status: Normal   Collection Time   07/24/11  8:52 PM      Component Value Range   Glucose-Capillary 85  70 - 99 (mg/dL)  GLUCOSE, CAPILLARY     Status: Abnormal   Collection Time   07/24/11  9:54 PM      Component Value Range   Glucose-Capillary 132 (*) 70 - 99 (mg/dL)   Comment 1 Documented in Chart     Comment 2 Notify RN    TSH     Status: Abnormal   Collection Time   07/24/11 10:59 PM      Component Value Range   TSH 0.301 (*) 0.350 - 4.500 (uIU/mL)  HEMOGLOBIN A1C     Status: Abnormal   Collection Time   07/24/11 10:59 PM      Component Value Range   Hemoglobin A1C 10.9 (*) <5.7 (%)   Mean Plasma Glucose 266 (*) <117 (mg/dL)  CARDIAC PANEL(CRET KIN+CKTOT+MB+TROPI)     Status: Normal   Collection Time   07/24/11 11:00 PM      Component Value Range   Total CK 183  7 - 232 (U/L)   CK, MB 1.9  0.3 - 4.0 (ng/mL)   Troponin I <0.30  <0.30 (ng/mL)   Relative Index 1.0  0.0 - 2.5   BASIC METABOLIC PANEL     Status: Abnormal   Collection Time   07/24/11 11:45 PM      Component Value Range   Sodium 137  135 - 145 (mEq/L)   Potassium 3.6  3.5 - 5.1 (mEq/L)   Chloride 104  96 - 112 (mEq/L)   CO2 24  19 - 32 (mEq/L)   Glucose, Bld 126 (*) 70 - 99 (mg/dL)   BUN 28 (*) 6 - 23 (mg/dL)   Creatinine, Ser 9.60 (*) 0.50 - 1.35 (mg/dL)   Calcium 9.0  8.4 - 45.4 (mg/dL)   GFR calc non Af Amer 56 (*) >90 (mL/min)   GFR calc Af Amer 65 (*) >90 (mL/min)  MRSA PCR SCREENING     Status: Normal   Collection Time   07/25/11  3:42 AM      Component Value Range   MRSA by PCR NEGATIVE  NEGATIVE   COMPREHENSIVE METABOLIC PANEL     Status: Abnormal  Collection Time   07/25/11  6:50 AM      Component Value Range   Sodium 135  135 - 145 (mEq/L)   Potassium 3.9  3.5 - 5.1 (mEq/L)   Chloride 103  96 - 112 (mEq/L)   CO2 24  19 - 32 (mEq/L)   Glucose, Bld 240 (*) 70 - 99 (mg/dL)   BUN 22  6 - 23 (mg/dL)   Creatinine, Ser 1.61  0.50 - 1.35 (mg/dL)   Calcium 8.9  8.4 - 09.6 (mg/dL)   Total Protein 6.5  6.0 - 8.3 (g/dL)   Albumin 3.3 (*) 3.5 - 5.2 (g/dL)   AST 20  0 - 37 (U/L)   ALT 21  0 - 53 (U/L)   Alkaline Phosphatase 87  39 - 117 (U/L)   Total Bilirubin 0.3  0.3 - 1.2 (mg/dL)   GFR calc non Af Amer 84 (*) >90 (mL/min)   GFR calc Af Amer >90  >90 (mL/min)  CBC     Status: Abnormal   Collection Time   07/25/11  6:50 AM      Component Value Range   WBC 7.0  4.0 - 10.5 (K/uL)   RBC 3.89 (*) 4.22 - 5.81 (MIL/uL)   Hemoglobin 12.5 (*) 13.0 - 17.0 (g/dL)   HCT 04.5 (*) 40.9 - 52.0 (%)   MCV 88.7  78.0 - 100.0 (fL)   MCH 32.1  26.0 - 34.0 (pg)   MCHC 36.2 (*) 30.0 - 36.0 (g/dL)   RDW 81.1  91.4 - 78.2 (%)   Platelets 144 (*) 150 - 400 (K/uL)  PROTIME-INR     Status: Normal   Collection Time   07/25/11  6:50 AM      Component Value Range   Prothrombin Time 13.1  11.6 - 15.2 (seconds)   INR 0.97  0.00 - 1.49   APTT     Status: Normal   Collection Time   07/25/11  6:50 AM      Component Value Range   aPTT 28  24 - 37  (seconds)  CARDIAC PANEL(CRET KIN+CKTOT+MB+TROPI)     Status: Normal   Collection Time   07/25/11  6:50 AM      Component Value Range   Total CK 173  7 - 232 (U/L)   CK, MB 1.7  0.3 - 4.0 (ng/mL)   Troponin I <0.30  <0.30 (ng/mL)   Relative Index 1.0  0.0 - 2.5   GLUCOSE, CAPILLARY     Status: Abnormal   Collection Time   07/25/11  7:46 AM      Component Value Range   Glucose-Capillary 255 (*) 70 - 99 (mg/dL)   Comment 1 Documented in Chart     Comment 2 Notify RN    CARDIAC PANEL(CRET KIN+CKTOT+MB+TROPI)     Status: Normal   Collection Time   07/25/11  3:20 PM      Component Value Range   Total CK 184  7 - 232 (U/L)   CK, MB 1.7  0.3 - 4.0 (ng/mL)   Troponin I <0.30  <0.30 (ng/mL)   Relative Index 0.9  0.0 - 2.5   GLUCOSE, CAPILLARY     Status: Abnormal   Collection Time   07/25/11  4:38 PM      Component Value Range   Glucose-Capillary 269 (*) 70 - 99 (mg/dL)     Micro Results: Recent Results (from the past 240 hour(s))  MRSA PCR SCREENING     Status: Normal  Collection Time   07/25/11  3:42 AM      Component Value Range Status Comment   MRSA by PCR NEGATIVE  NEGATIVE  Final     Studies/Results: No results found. Medications: Scheduled Meds:   . enoxaparin  40 mg Subcutaneous Q24H  . insulin aspart  0-9 Units Subcutaneous Q4H  . insulin aspart  3 Units Subcutaneous TID WC  . insulin glargine  10 Units Subcutaneous QHS  . DISCONTD: insulin glargine  10 Units Subcutaneous QHS  . DISCONTD: insulin regular  0-10 Units Intravenous TID WC   Continuous Infusions:   . sodium chloride 75 mL/hr at 07/25/11 0103  . dextrose 5 % and 0.45% NaCl 75 mL/hr at 07/25/11 0500  . DISCONTD: sodium chloride    . DISCONTD: sodium chloride    . DISCONTD: insulin (NOVOLIN-R) infusion 0.7 Units/hr (07/24/11 2305)   PRN Meds:.acetaminophen, acetaminophen, dextrose, ondansetron (ZOFRAN) IV, ondansetron, oxyCODONE  Assessment and plan: Present on Admission:  .Drug overdose:  Overdose of metformin, haldol and zoloft, pt stated he took 3 days worth of medications .  EKG does not show qt prolongation. He is asymptomatic   .Metabolic encephalopathy: secondary to drug overdose.  Resolved.  .Diabetes mellitus due to underlying condition with complication: ;.l On lantus and SSI .DEPRESSION: Hold all antidepressive medications. Psychiatry consult called.  .Asthma: stable continue with bronchodilator therapy.  Marland KitchenHeartburn: will start him on po protonix.  .Acute renal failure: most likely secondary to dehydration. Resolved.  Marland KitchenHyponatremia: secondary to dehydration and hyperglycemia. Resolved.  .Anemia: normocytic.       LOS: 1 day   Wayne [name] 07/25/2011, 10:52 AM

## 2011-07-25 NOTE — Consult Note (Signed)
Reason for Consult: Depression with suicidal ideations Referring Physician: Dr. Eilleen Kempf is an 35 y.o. male.  HPI: This is a 35 years old African American young male admitted to the [facility] hospital unit for depression, suicidal attempt with the overdose on prescription medication. Patient has been depressed over severe psychosocial stressors. Patient was lived in the apartment but unable to pay for rent because of low income and relocated to his mom's home. Patient was not making enough and not even able to get  enough food sometimes. 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 He reportedly abused cocaine 3 days ago, next day felt depressed, feeling helpless,  worthless,  sad and then overdosed on his medication Haldol and Zoloft. Patient reportedly taken 30 mg of Haldol and 600 mg of Zoloft. He was brought in by the  EMS after contacted his case manager  Mrs. Kara Mead at [facility]  (psychosocial therapeutic services). Patient was received treatment in the past from the [facility].  He was admitted to [facility] twice in the past,  his last admission was served August 2012 for the suicidal ideation and May 2007 for depression. Patient denied current symptoms of for depression, suicidal ideation and, intentions and plan. He has no homicidal ideations, intentions, or plans. He has denied hallucinations, delusions and paranoia.  Past Medical History  Diagnosis Date  . Depression   . Diabetes mellitus   . Hyperlipidemia   . HTN (hypertension)   . Chronic headaches   . Osteoarthritis   . Kidney stones   . Polysubstance abuse     cocaine, marijuana-quit summer 2010  . Tobacco abuse   . Asthma   . Chest pain   . Lumbar disc disease   . GERD (gastroesophageal reflux disease)   . OSA (obstructive sleep apnea)   . Peripheral neuropathy 02/01/2011    Past Surgical History  Procedure Date  . Knee surgery   .  Bunionectomy 06-2009    Family History  Problem Relation Age of Onset  . Heart disease Mother   . Diabetes Father   . Hyperlipidemia Father   . Emphysema Maternal Uncle   . Asthma Sister   . Asthma Mother   . Stroke Mother   . Hypertension Father   . Alzheimer's disease Maternal Grandmother   . Depression Father   . Breast cancer Maternal Aunt   . COPD Mother     maternal un    Social History:  reports that he quit smoking about 4 months ago. His smoking use included Cigarettes. He has a 3.4 pack-year smoking history. He has quit using smokeless tobacco. He reports that he drinks alcohol. He reports that he uses illicit drugs.  Allergies:  Allergies  Allergen Reactions  . Iodine     rash  . Nsaids     Abdominal cramping    Medications: I have reviewed the patient's current medications.  Results for orders placed during the hospital encounter of 07/24/11 (from the past 48 hour(s))  CBC     Status: Abnormal   Collection Time   07/24/11  4:56 AM      Component Value Range Comment   WBC 6.9  4.0 - 10.5 (K/uL)    RBC 4.12 (*) 4.22 - 5.81 (MIL/uL)    Hemoglobin 12.7 (*) 13.0 - 17.0 (g/dL)    HCT 40.9 (*) 81.1 - 52.0 (%)  MCV 87.9  78.0 - 100.0 (fL)    MCH 30.8  26.0 - 34.0 (pg)    MCHC 35.1  30.0 - 36.0 (g/dL)    RDW 16.1  09.6 - 04.5 (%)    Platelets 167  150 - 400 (K/uL)   COMPREHENSIVE METABOLIC PANEL     Status: Abnormal   Collection Time   07/24/11  4:56 AM      Component Value Range Comment   Sodium 133 (*) 135 - 145 (mEq/L)    Potassium 4.2  3.5 - 5.1 (mEq/L) SLIGHT HEMOLYSIS   Chloride 95 (*) 96 - 112 (mEq/L)    CO2 25  19 - 32 (mEq/L)    Glucose, Bld 499 (*) 70 - 99 (mg/dL)    BUN 22  6 - 23 (mg/dL)    Creatinine, Ser 4.09 (*) 0.50 - 1.35 (mg/dL)    Calcium 9.6  8.4 - 10.5 (mg/dL)    Total Protein 7.6  6.0 - 8.3 (g/dL)    Albumin 4.2  3.5 - 5.2 (g/dL)    AST 30  0 - 37 (U/L) SLIGHT HEMOLYSIS   ALT 22  0 - 53 (U/L)    Alkaline Phosphatase 222 (*) 39  - 117 (U/L)    Total Bilirubin 0.3  0.3 - 1.2 (mg/dL)    GFR calc non Af Amer 58 (*) >90 (mL/min)    GFR calc Af Amer 67 (*) >90 (mL/min)   ETHANOL     Status: Normal   Collection Time   07/24/11  4:56 AM      Component Value Range Comment   Alcohol, Ethyl (B) <11  0 - 11 (mg/dL)   SALICYLATE LEVEL     Status: Abnormal   Collection Time   07/24/11  4:56 AM      Component Value Range Comment   Salicylate Lvl <2.0 (*) 2.8 - 20.0 (mg/dL)   ACETAMINOPHEN LEVEL     Status: Normal   Collection Time   07/24/11  4:56 AM      Component Value Range Comment   Acetaminophen (Tylenol), Serum <15.0  10 - 30 (ug/mL)   GLUCOSE, CAPILLARY     Status: Abnormal   Collection Time   07/24/11  5:45 AM      Component Value Range Comment   Glucose-Capillary 538 (*) 70 - 99 (mg/dL)    Comment 1 Documented in Chart      Comment 2 Notify RN     URINE RAPID DRUG SCREEN (HOSP PERFORMED)     Status: Abnormal   Collection Time   07/24/11  6:00 AM      Component Value Range Comment   Opiates NONE DETECTED  NONE DETECTED     Cocaine POSITIVE (*) NONE DETECTED     Benzodiazepines NONE DETECTED  NONE DETECTED     Amphetamines NONE DETECTED  NONE DETECTED     Tetrahydrocannabinol NONE DETECTED  NONE DETECTED     Barbiturates NONE DETECTED  NONE DETECTED    GLUCOSE, CAPILLARY     Status: Abnormal   Collection Time   07/24/11  7:01 AM      Component Value Range Comment   Glucose-Capillary 530 (*) 70 - 99 (mg/dL)    Comment 1 Documented in Chart      Comment 2 Notify RN     GLUCOSE, CAPILLARY     Status: Abnormal   Collection Time   07/24/11  8:18 AM      Component Value Range Comment  Glucose-Capillary 513 (*) 70 - 99 (mg/dL)    Comment 1 Notify RN     GLUCOSE, CAPILLARY     Status: Abnormal   Collection Time   07/24/11  9:27 AM      Component Value Range Comment   Glucose-Capillary 439 (*) 70 - 99 (mg/dL)   GLUCOSE, CAPILLARY     Status: Abnormal   Collection Time   07/24/11 10:43 AM       Component Value Range Comment   Glucose-Capillary 240 (*) 70 - 99 (mg/dL)   GLUCOSE, CAPILLARY     Status: Normal   Collection Time   07/24/11 11:45 AM      Component Value Range Comment   Glucose-Capillary 86  70 - 99 (mg/dL)   GLUCOSE, CAPILLARY     Status: Abnormal   Collection Time   07/24/11 12:45 PM      Component Value Range Comment   Glucose-Capillary 103 (*) 70 - 99 (mg/dL)   GLUCOSE, CAPILLARY     Status: Abnormal   Collection Time   07/24/11  1:43 PM      Component Value Range Comment   Glucose-Capillary 180 (*) 70 - 99 (mg/dL)   GLUCOSE, CAPILLARY     Status: Abnormal   Collection Time   07/24/11  2:50 PM      Component Value Range Comment   Glucose-Capillary 204 (*) 70 - 99 (mg/dL)   GLUCOSE, CAPILLARY     Status: Abnormal   Collection Time   07/24/11  3:41 PM      Component Value Range Comment   Glucose-Capillary 198 (*) 70 - 99 (mg/dL)   GLUCOSE, CAPILLARY     Status: Abnormal   Collection Time   07/24/11  5:11 PM      Component Value Range Comment   Glucose-Capillary 252 (*) 70 - 99 (mg/dL)   GLUCOSE, CAPILLARY     Status: Abnormal   Collection Time   07/24/11  6:32 PM      Component Value Range Comment   Glucose-Capillary 301 (*) 70 - 99 (mg/dL)   GLUCOSE, CAPILLARY     Status: Abnormal   Collection Time   07/24/11  7:42 PM      Component Value Range Comment   Glucose-Capillary 125 (*) 70 - 99 (mg/dL)   GLUCOSE, CAPILLARY     Status: Normal   Collection Time   07/24/11  8:52 PM      Component Value Range Comment   Glucose-Capillary 85  70 - 99 (mg/dL)   GLUCOSE, CAPILLARY     Status: Abnormal   Collection Time   07/24/11  9:54 PM      Component Value Range Comment   Glucose-Capillary 132 (*) 70 - 99 (mg/dL)    Comment 1 Documented in Chart      Comment 2 Notify RN     TSH     Status: Abnormal   Collection Time   07/24/11 10:59 PM      Component Value Range Comment   TSH 0.301 (*) 0.350 - 4.500 (uIU/mL)   HEMOGLOBIN A1C     Status: Abnormal     Collection Time   07/24/11 10:59 PM      Component Value Range Comment   Hemoglobin A1C 10.9 (*) <5.7 (%)    Mean Plasma Glucose 266 (*) <117 (mg/dL)   CARDIAC PANEL(CRET KIN+CKTOT+MB+TROPI)     Status: Normal   Collection Time   07/24/11 11:00 PM      Component Value  Range Comment   Total CK 183  7 - 232 (U/L)    CK, MB 1.9  0.3 - 4.0 (ng/mL)    Troponin I <0.30  <0.30 (ng/mL)    Relative Index 1.0  0.0 - 2.5    BASIC METABOLIC PANEL     Status: Abnormal   Collection Time   07/24/11 11:45 PM      Component Value Range Comment   Sodium 137  135 - 145 (mEq/L)    Potassium 3.6  3.5 - 5.1 (mEq/L)    Chloride 104  96 - 112 (mEq/L)    CO2 24  19 - 32 (mEq/L)    Glucose, Bld 126 (*) 70 - 99 (mg/dL)    BUN 28 (*) 6 - 23 (mg/dL)    Creatinine, Ser 1.19 (*) 0.50 - 1.35 (mg/dL)    Calcium 9.0  8.4 - 10.5 (mg/dL)    GFR calc non Af Amer 56 (*) >90 (mL/min)    GFR calc Af Amer 65 (*) >90 (mL/min)   MRSA PCR SCREENING     Status: Normal   Collection Time   07/25/11  3:42 AM      Component Value Range Comment   MRSA by PCR NEGATIVE  NEGATIVE    COMPREHENSIVE METABOLIC PANEL     Status: Abnormal   Collection Time   07/25/11  6:50 AM      Component Value Range Comment   Sodium 135  135 - 145 (mEq/L)    Potassium 3.9  3.5 - 5.1 (mEq/L)    Chloride 103  96 - 112 (mEq/L)    CO2 24  19 - 32 (mEq/L)    Glucose, Bld 240 (*) 70 - 99 (mg/dL)    BUN 22  6 - 23 (mg/dL)    Creatinine, Ser 1.47  0.50 - 1.35 (mg/dL)    Calcium 8.9  8.4 - 10.5 (mg/dL)    Total Protein 6.5  6.0 - 8.3 (g/dL)    Albumin 3.3 (*) 3.5 - 5.2 (g/dL)    AST 20  0 - 37 (U/L)    ALT 21  0 - 53 (U/L)    Alkaline Phosphatase 87  39 - 117 (U/L)    Total Bilirubin 0.3  0.3 - 1.2 (mg/dL)    GFR calc non Af Amer 84 (*) >90 (mL/min)    GFR calc Af Amer >90  >90 (mL/min)   CBC     Status: Abnormal   Collection Time   07/25/11  6:50 AM      Component Value Range Comment   WBC 7.0  4.0 - 10.5 (K/uL)    RBC 3.89 (*) 4.22 -  5.81 (MIL/uL)    Hemoglobin 12.5 (*) 13.0 - 17.0 (g/dL)    HCT 82.9 (*) 56.2 - 52.0 (%)    MCV 88.7  78.0 - 100.0 (fL)    MCH 32.1  26.0 - 34.0 (pg)    MCHC 36.2 (*) 30.0 - 36.0 (g/dL)    RDW 13.0  86.5 - 78.4 (%)    Platelets 144 (*) 150 - 400 (K/uL)   PROTIME-INR     Status: Normal   Collection Time   07/25/11  6:50 AM      Component Value Range Comment   Prothrombin Time 13.1  11.6 - 15.2 (seconds)    INR 0.97  0.00 - 1.49    APTT     Status: Normal   Collection Time   07/25/11  6:50 AM  Component Value Range Comment   aPTT 28  24 - 37 (seconds)   CARDIAC PANEL(CRET KIN+CKTOT+MB+TROPI)     Status: Normal   Collection Time   07/25/11  6:50 AM      Component Value Range Comment   Total CK 173  7 - 232 (U/L)    CK, MB 1.7  0.3 - 4.0 (ng/mL)    Troponin I <0.30  <0.30 (ng/mL)    Relative Index 1.0  0.0 - 2.5    GLUCOSE, CAPILLARY     Status: Abnormal   Collection Time   07/25/11  7:46 AM      Component Value Range Comment   Glucose-Capillary 255 (*) 70 - 99 (mg/dL)    Comment 1 Documented in Chart      Comment 2 Notify RN       No results found.  No depression, No anxiety and No psychosis Blood pressure 110/71, pulse 90, temperature 97.5 F (36.4 C), temperature source Oral, resp. rate 33, weight 91.627 kg (202 lb), SpO2 100.00%.   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 Patient may need substance rehabilitation services as a disposition plan. Recommended no medication management at this time. His content is social services for availability of inpatient psychiatric bed.  Trudee Chirino,JANARDHAHA R. 07/25/2011, 2:34 PM

## 2011-07-26 ENCOUNTER — Encounter (HOSPITAL_COMMUNITY): Payer: Self-pay | Admitting: Cardiology

## 2011-07-26 ENCOUNTER — Other Ambulatory Visit: Payer: Self-pay

## 2011-07-26 LAB — GLUCOSE, CAPILLARY
Glucose-Capillary: 526 mg/dL — ABNORMAL HIGH (ref 70–99)
Glucose-Capillary: 151 mg/dL — ABNORMAL HIGH (ref 70–99)
Glucose-Capillary: 168 mg/dL — ABNORMAL HIGH (ref 70–99)
Glucose-Capillary: 226 mg/dL — ABNORMAL HIGH (ref 70–99)

## 2011-07-26 MED ORDER — INSULIN GLARGINE 100 UNIT/ML ~~LOC~~ SOLN: 25.0000 [IU] | Freq: Every day | SUBCUTANEOUS | Status: DC

## 2011-07-26 MED ORDER — LORATADINE 10 MG PO TABS
10.0000 mg | ORAL_TABLET | Freq: Every day | ORAL | Status: DC
  Administered 2011-07-26 – 2011-07-30 (×6): 10 mg via ORAL
  Filled 2011-07-26 (×5): qty 1

## 2011-07-26 MED ORDER — GLIPIZIDE 10 MG PO TABS
10.0000 mg | ORAL_TABLET | Freq: Every day | ORAL | Status: DC
  Administered 2011-07-26 – 2011-07-30 (×5): 10 mg via ORAL
  Filled 2011-07-26 (×5): qty 1

## 2011-07-26 MED ORDER — INSULIN ASPART 100 UNIT/ML ~~LOC~~ SOLN
0.0000 [IU] | Freq: Every day | SUBCUTANEOUS | Status: DC
  Administered 2011-07-26: 2 [IU] via SUBCUTANEOUS
  Administered 2011-07-27: 0 [IU] via SUBCUTANEOUS

## 2011-07-26 MED ORDER — INSULIN ASPART 100 UNIT/ML ~~LOC~~ SOLN
0.0000 [IU] | Freq: Three times a day (TID) | SUBCUTANEOUS | Status: DC
  Administered 2011-07-26 – 2011-07-27 (×2): 5 [IU] via SUBCUTANEOUS
  Administered 2011-07-27: 3 [IU] via SUBCUTANEOUS
  Administered 2011-07-28 (×2): 5 [IU] via SUBCUTANEOUS
  Administered 2011-07-28: 3 [IU] via SUBCUTANEOUS
  Administered 2011-07-29: 5 [IU] via SUBCUTANEOUS
  Administered 2011-07-29 (×2): 3 [IU] via SUBCUTANEOUS
  Administered 2011-07-30: 8 [IU] via SUBCUTANEOUS
  Administered 2011-07-30: 3 [IU] via SUBCUTANEOUS

## 2011-07-26 NOTE — Consult Note (Signed)
Reason for Consult: ?NSVT, abn. EKG Referring Physician: [facility] hospitalist   Wayne [name] is an 35 y.o. male.    Chief Complaint:  Pt. Admitted for drug overdose  HPI: 35 year old male with h/o uncontrolled diabetes, depression , asthma, gerd, Hypertension was brought in by [facility], for altered mental status secondary to metformin, haldol adn zoloft overdose 0n 07/24/11. Pt appeared very lethargic and did not provide detailed history. He did answer simple questions that he took medications to kill himself and he knows that he is in the hospital. He denied any pain, nausea or vomiting. Psych has seen and evaluated. Tele strips yest. With wide complex tachycardia. Though rate would be > 600 min.  Doubt it is anything but artifact.  He was up to [facility] and denied chest pain to the nurse. Now he states he was somewhat dizzy. EKG with early repol vs st elevation. Currently no complaints except with a deep breath. POSITIVE risk factors for CAD, DM, HTN, HYPERLIPIDEMIA,Family history.      Past Medical History  Diagnosis Date  . Depression   . Diabetes mellitus   . Hyperlipidemia   . HTN (hypertension)   . Chronic headaches   . Osteoarthritis   . Kidney stones   . Polysubstance abuse     cocaine, marijuana-quit summer 2010  . Tobacco abuse   . Asthma   . Chest pain   . Lumbar disc disease   . GERD (gastroesophageal reflux disease)   . OSA (obstructive sleep apnea)   . Peripheral neuropathy 02/01/2011    Past Surgical History  Procedure Date  . Knee surgery   . Bunionectomy 06-2009    Family History  Problem Relation Age of Onset  . Heart disease Mother   . Diabetes Father   . Hyperlipidemia Father   . Emphysema Maternal Uncle   . Asthma Sister   . Asthma Mother   . Stroke Mother   . Hypertension Father   . Alzheimer's disease Maternal Grandmother   . Depression Father   . Breast cancer Maternal Aunt   . COPD Mother     maternal un   Social History:  reports that he quit smoking  about 5 months ago. His smoking use included Cigarettes. He has a 3.4 pack-year smoking history. He has quit using smokeless tobacco. He reports that he drinks alcohol. He reports that he uses illicit drugs.  Allergies:  Allergies  Allergen Reactions  . Iodine     rash  . Nsaids     Abdominal cramping    Medications Prior to Admission  Medication Dose Route Frequency Provider Last Rate Last Dose  . 0.9 %  sodium chloride infusion   Intravenous Continuous Vijaya Akula 75 mL/hr at 07/26/11 1500    . acetaminophen (TYLENOL) tablet 650 mg  650 mg Oral Q6H PRN Vijaya Akula       Or  . acetaminophen (TYLENOL) suppository 650 mg  650 mg Rectal Q6H PRN Vijaya Akula      . dextrose 50 % solution 25 mL  25 mL Intravenous PRN Lyanne Co, MD      . enoxaparin (LOVENOX) injection 40 mg  40 mg Subcutaneous Q24H Vijaya Akula   40 mg at 07/25/11 2213  . glipiZIDE (GLUCOTROL) tablet 10 mg  10 mg Oral QAC breakfast Vijaya Akula   10 mg at 07/26/11 1507  . insulin aspart (novoLOG) injection 0-15 Units  0-15 Units Subcutaneous TID WC Vijaya Akula      .  insulin aspart (novoLOG) injection 0-5 Units  0-5 Units Subcutaneous QHS Vijaya Akula      . insulin aspart (novoLOG) injection 3 Units  3 Units Subcutaneous TID WC Vijaya Akula   3 Units at 07/26/11 1226  . insulin glargine (LANTUS) injection 25 Units  25 Units Subcutaneous QHS Vijaya Akula   25 Units at 07/25/11 2141  . loratadine (CLARITIN) tablet 10 mg  10 mg Oral Daily Vijaya Akula   10 mg at 07/26/11 1226  . ondansetron (ZOFRAN) tablet 4 mg  4 mg Oral Q6H PRN Vijaya Akula       Or  . ondansetron (ZOFRAN) injection 4 mg  4 mg Intravenous Q6H PRN Vijaya Akula      . oxyCODONE (Oxy IR/ROXICODONE) immediate release tablet 5 mg  5 mg Oral Q4H PRN Vijaya Akula      . sodium chloride 0.9 % bolus 1,000 mL  1,000 mL Intravenous Once Lyanne Co, MD   1,000 mL at 07/24/11 0450  . sodium chloride 0.9 % bolus 1,000 mL  1,000 mL Intravenous Once Lyanne Co, MD   1,000 mL at 07/24/11 0542  . DISCONTD: 0.9 %  sodium chloride infusion   Intravenous Continuous Lyanne Co, MD      . DISCONTD: 0.9 %  sodium chloride infusion   Intravenous Continuous Lyanne Co, MD      . DISCONTD: dextrose 5 %-0.45 % sodium chloride infusion   Intravenous Continuous Lyanne Co, MD      . DISCONTD: dextrose 5 %-0.45 % sodium chloride infusion   Intravenous Continuous Lyanne Co, MD 75 mL/hr at 07/25/11 0500    . DISCONTD: dextrose 50 % solution 25 mL  25 mL Intravenous PRN Lyanne Co, MD      . DISCONTD: insulin aspart (novoLOG) injection 0-9 Units  0-9 Units Subcutaneous Q4H Rolan Lipa   3 Units at 07/26/11 1226  . DISCONTD: insulin glargine (LANTUS) injection 10 Units  10 Units Subcutaneous QHS Rolan Lipa      . DISCONTD: insulin glargine (LANTUS) injection 10 Units  10 Units Subcutaneous QHS Vijaya Akula   10 Units at 07/25/11 0204  . DISCONTD: insulin glargine (LANTUS) injection 25 Units  25 Units Subcutaneous QHS Vijaya Akula      . DISCONTD: insulin regular (NOVOLIN [name],HUMULIN [name]) 1 Units/mL in sodium chloride 0.9 % 100 mL infusion   Intravenous Continuous Lyanne Co, MD      . DISCONTD: insulin regular (NOVOLIN [name],HUMULIN [name]) 1 Units/mL in sodium chloride 0.9 % 100 mL infusion   Intravenous Continuous Lyanne Co, MD 0.7 mL/hr at 07/24/11 2305 0.7 Units/hr at 07/24/11 2305  . DISCONTD: insulin regular bolus via infusion 0-10 Units  0-10 Units Intravenous TID WC Lyanne Co, MD      . DISCONTD: insulin regular bolus via infusion 0-10 Units  0-10 Units Intravenous TID WC Lyanne Co, MD   4.7 Units at 07/24/11 0715   Medications Prior to Admission  Medication Sig Dispense Refill  . aspirin EC 81 MG tablet Take 81 mg by mouth daily.        [name] buPROPion (WELLBUTRIN SR) 150 MG 12 hr tablet Take 150 mg by mouth daily.        . Cholecalciferol (VITAMIN D) 1000 UNITS capsule Take 1,000 Units by mouth daily.          . cyclobenzaprine (FLEXERIL) 5 MG tablet Take 1 tablet (5 mg total)  by mouth every 8 (eight) hours as needed for muscle spasms.  60 tablet  1  . esomeprazole (NEXIUM) 40 MG capsule Take 1 capsule (40 mg total) by mouth daily.  90 capsule  3  . fish oil-omega-3 fatty acids 1000 MG capsule Take 2 g by mouth daily.        [name] glipiZIDE (GLUCOTROL XL) 10 MG 24 hr tablet 1 tab per day for blood sugar more than 200  30 tablet  11  . Glucosamine 500 MG TABS Take 1 tablet by mouth daily.        [name] glucose blood (ONE TOUCH ULTRA TEST) test strip PRN  100 each  5  . haloperidol (HALDOL) 10 MG tablet Take 10 mg by mouth daily.        . hydrOXYzine (VISTARIL) 25 MG capsule Take 25 mg by mouth 3 (three) times daily as needed.        . insulin glargine (LANTUS) 100 UNIT/ML injection Inject 50 Units into the skin at bedtime.        . insulin lispro (HUMALOG) 100 UNIT/ML injection Inject 10 Units into the skin 3 (three) times daily before meals.        . Lancets (ONETOUCH ULTRASOFT) lancets PRN  100 each  5  . lisinopril (PRINIVIL,ZESTRIL) 20 MG tablet Take 1 tablet (20 mg total) by mouth daily.  90 tablet  3  . metFORMIN (GLUCOPHAGE) 500 MG tablet Take 2 tablets (1,000 mg total) by mouth 2 (two) times daily with a meal.  120 tablet  11  . Multiple Vitamin (MULTIVITAMIN) capsule Take 1 capsule by mouth daily.        . naproxen (EC NAPROSYN) 500 MG EC tablet Take 1 tablet (500 mg total) by mouth 2 (two) times daily with a meal.  60 tablet  5  . sertraline (ZOLOFT) 100 MG tablet Take 200 mg by mouth daily.        [name] zolpidem (AMBIEN) 10 MG tablet Take 10 mg by mouth at bedtime as needed.          Results for orders placed during the hospital encounter of 07/24/11 (from the past 48 hour(s))  GLUCOSE, CAPILLARY     Status: Abnormal   Collection Time   07/24/11  5:11 PM      Component Value Range Comment   Glucose-Capillary 252 (*) 70 - 99 (mg/dL)   GLUCOSE, CAPILLARY     Status: Abnormal   Collection Time    07/24/11  6:32 PM      Component Value Range Comment   Glucose-Capillary 301 (*) 70 - 99 (mg/dL)   GLUCOSE, CAPILLARY     Status: Abnormal   Collection Time   07/24/11  7:42 PM      Component Value Range Comment   Glucose-Capillary 125 (*) 70 - 99 (mg/dL)   GLUCOSE, CAPILLARY     Status: Normal   Collection Time   07/24/11  8:52 PM      Component Value Range Comment   Glucose-Capillary 85  70 - 99 (mg/dL)   GLUCOSE, CAPILLARY     Status: Abnormal   Collection Time   07/24/11  9:54 PM      Component Value Range Comment   Glucose-Capillary 132 (*) 70 - 99 (mg/dL)    Comment 1 Documented in Chart      Comment 2 Notify RN     TSH     Status: Abnormal   Collection Time   07/24/11 10:59  PM      Component Value Range Comment   TSH 0.301 (*) 0.350 - 4.500 (uIU/mL)   HEMOGLOBIN A1C     Status: Abnormal   Collection Time   07/24/11 10:59 PM      Component Value Range Comment   Hemoglobin A1C 10.9 (*) <5.7 (%)    Mean Plasma Glucose 266 (*) <117 (mg/dL)   CARDIAC PANEL(CRET KIN+CKTOT+MB+TROPI)     Status: Normal   Collection Time   07/24/11 11:00 PM      Component Value Range Comment   Total CK 183  7 - 232 (U/L)    CK, MB 1.9  0.3 - 4.0 (ng/mL)    Troponin I <0.30  <0.30 (ng/mL)    Relative Index 1.0  0.0 - 2.5    GLUCOSE, CAPILLARY     Status: Abnormal   Collection Time   07/24/11 11:03 PM      Component Value Range Comment   Glucose-Capillary 133 (*) 70 - 99 (mg/dL)   BASIC METABOLIC PANEL     Status: Abnormal   Collection Time   07/24/11 11:45 PM      Component Value Range Comment   Sodium 137  135 - 145 (mEq/L)    Potassium 3.6  3.5 - 5.1 (mEq/L)    Chloride 104  96 - 112 (mEq/L)    CO2 24  19 - 32 (mEq/L)    Glucose, Bld 126 (*) 70 - 99 (mg/dL)    BUN 28 (*) 6 - 23 (mg/dL)    Creatinine, Ser 4.78 (*) 0.50 - 1.35 (mg/dL)    Calcium 9.0  8.4 - 10.5 (mg/dL)    GFR calc non Af Amer 56 (*) >90 (mL/min)    GFR calc Af Amer 65 (*) >90 (mL/min)   GLUCOSE, CAPILLARY      Status: Abnormal   Collection Time   07/25/11 12:03 AM      Component Value Range Comment   Glucose-Capillary 126 (*) 70 - 99 (mg/dL)   GLUCOSE, CAPILLARY     Status: Abnormal   Collection Time   07/25/11  1:00 AM      Component Value Range Comment   Glucose-Capillary 151 (*) 70 - 99 (mg/dL)   GLUCOSE, CAPILLARY     Status: Abnormal   Collection Time   07/25/11  2:01 AM      Component Value Range Comment   Glucose-Capillary 177 (*) 70 - 99 (mg/dL)   GLUCOSE, CAPILLARY     Status: Abnormal   Collection Time   07/25/11  2:58 AM      Component Value Range Comment   Glucose-Capillary 194 (*) 70 - 99 (mg/dL)   MRSA PCR SCREENING     Status: Normal   Collection Time   07/25/11  3:42 AM      Component Value Range Comment   MRSA by PCR NEGATIVE  NEGATIVE    GLUCOSE, CAPILLARY     Status: Abnormal   Collection Time   07/25/11  4:34 AM      Component Value Range Comment   Glucose-Capillary 211 (*) 70 - 99 (mg/dL)    Comment 1 Documented in Chart      Comment 2 Notify RN     GLUCOSE, CAPILLARY     Status: Abnormal   Collection Time   07/25/11  6:28 AM      Component Value Range Comment   Glucose-Capillary 226 (*) 70 - 99 (mg/dL)   COMPREHENSIVE METABOLIC PANEL  Status: Abnormal   Collection Time   07/25/11  6:50 AM      Component Value Range Comment   Sodium 135  135 - 145 (mEq/L)    Potassium 3.9  3.5 - 5.1 (mEq/L)    Chloride 103  96 - 112 (mEq/L)    CO2 24  19 - 32 (mEq/L)    Glucose, Bld 240 (*) 70 - 99 (mg/dL)    BUN 22  6 - 23 (mg/dL)    Creatinine, Ser 1.61  0.50 - 1.35 (mg/dL)    Calcium 8.9  8.4 - 10.5 (mg/dL)    Total Protein 6.5  6.0 - 8.3 (g/dL)    Albumin 3.3 (*) 3.5 - 5.2 (g/dL)    AST 20  0 - 37 (U/L)    ALT 21  0 - 53 (U/L)    Alkaline Phosphatase 87  39 - 117 (U/L)    Total Bilirubin 0.3  0.3 - 1.2 (mg/dL)    GFR calc non Af Amer 84 (*) >90 (mL/min)    GFR calc Af Amer >90  >90 (mL/min)   CBC     Status: Abnormal   Collection Time   07/25/11  6:50  AM      Component Value Range Comment   WBC 7.0  4.0 - 10.5 (K/uL)    RBC 3.89 (*) 4.22 - 5.81 (MIL/uL)    Hemoglobin 12.5 (*) 13.0 - 17.0 (g/dL)    HCT 09.6 (*) 04.5 - 52.0 (%)    MCV 88.7  78.0 - 100.0 (fL)    MCH 32.1  26.0 - 34.0 (pg)    MCHC 36.2 (*) 30.0 - 36.0 (g/dL)    RDW 40.9  81.1 - 91.4 (%)    Platelets 144 (*) 150 - 400 (K/uL)   PROTIME-INR     Status: Normal   Collection Time   07/25/11  6:50 AM      Component Value Range Comment   Prothrombin Time 13.1  11.6 - 15.2 (seconds)    INR 0.97  0.00 - 1.49    APTT     Status: Normal   Collection Time   07/25/11  6:50 AM      Component Value Range Comment   aPTT 28  24 - 37 (seconds)   CARDIAC PANEL(CRET KIN+CKTOT+MB+TROPI)     Status: Normal   Collection Time   07/25/11  6:50 AM      Component Value Range Comment   Total CK 173  7 - 232 (U/L)    CK, MB 1.7  0.3 - 4.0 (ng/mL)    Troponin I <0.30  <0.30 (ng/mL)    Relative Index 1.0  0.0 - 2.5    GLUCOSE, CAPILLARY     Status: Abnormal   Collection Time   07/25/11  7:46 AM      Component Value Range Comment   Glucose-Capillary 255 (*) 70 - 99 (mg/dL)    Comment 1 Documented in Chart      Comment 2 Notify RN     GLUCOSE, CAPILLARY     Status: Abnormal   Collection Time   07/25/11 12:01 PM      Component Value Range Comment   Glucose-Capillary 326 (*) 70 - 99 (mg/dL)   CARDIAC PANEL(CRET KIN+CKTOT+MB+TROPI)     Status: Normal   Collection Time   07/25/11  3:20 PM      Component Value Range Comment   Total CK 184  7 - 232 (U/L)    CK,  MB 1.7  0.3 - 4.0 (ng/mL)    Troponin I <0.30  <0.30 (ng/mL)    Relative Index 0.9  0.0 - 2.5    GLUCOSE, CAPILLARY     Status: Abnormal   Collection Time   07/25/11  4:38 PM      Component Value Range Comment   Glucose-Capillary 269 (*) 70 - 99 (mg/dL)   GLUCOSE, CAPILLARY     Status: Abnormal   Collection Time   07/25/11  7:30 PM      Component Value Range Comment   Glucose-Capillary 282 (*) 70 - 99 (mg/dL)    Comment 1  Notify RN      Comment 2 Documented in Chart     GLUCOSE, CAPILLARY     Status: Abnormal   Collection Time   07/25/11 11:32 PM      Component Value Range Comment   Glucose-Capillary 257 (*) 70 - 99 (mg/dL)   GLUCOSE, CAPILLARY     Status: Abnormal   Collection Time   07/26/11  3:23 AM      Component Value Range Comment   Glucose-Capillary 151 (*) 70 - 99 (mg/dL)   GLUCOSE, CAPILLARY     Status: Abnormal   Collection Time   07/26/11  7:52 AM      Component Value Range Comment   Glucose-Capillary 168 (*) 70 - 99 (mg/dL)   GLUCOSE, CAPILLARY     Status: Abnormal   Collection Time   07/26/11 12:02 PM      Component Value Range Comment   Glucose-Capillary 242 (*) 70 - 99 (mg/dL)    No results found.  ROS: General:no colds or fevers , + drug use  Skin:no rashes HEENT:no blurred vision CV:No chest pain Pul:no SOB GU:no dysuria GI:no diarrhea, no constipation Neuro:no syncope Endocrine: uncontrolled diabetes.  Blood pressure 133/83, pulse 67, temperature 98.5 F (36.9 C), temperature source Oral, resp. rate 18, height 6\' 1"  (1.854 m), weight 92.2 kg (203 lb 4.2 oz), SpO2 96.00%. PE: General:A&O X3,  Pleasant affect. Skin:W&D Brisk capillary refill Heent:normocephalic, sclera clear Neck:supple no JVD no bruits Heart:S1S2, RRR, no murmur, gallup or rub. Lungs:clear without rales, rhonci or wheezes Abd:+BS, soft, non tender no masses Ext:no edema, 2 + pedal pulses Neuro:A&O X 3, MAE follows commands.   Assessment/Plan Patient Active Problem List  Diagnoses  . DIABETES MELLITUS, TYPE II  . HYPOGONADISM, MALE  . HYPERLIPIDEMIA  . ERECTILE DYSFUNCTION  . SUBSTANCE ABUSE, MULTIPLE  . DEPRESSION  . OBSTRUCTIVE SLEEP APNEA  . HYPERTENSION  . LUNG NODULE  . IBS  . OSTEOARTHRITIS  . SHOULDER PAIN, RIGHT  . SCIATICA, RIGHT  . BACK PAIN  . Headache  . CHEST PAIN  . FRACTURE, INDEX FINGER, RIGHT  . RENAL CALCULUS, HX OF  . GERD  . Peripheral neuropathy  . Asthma  .  Preventative health care  . Neck pain  . Hand pain, left  . Drug overdose  . Metabolic encephalopathy  . Diabetes mellitus due to underlying condition with complication  . Acute renal failure  . Hyponatremia  . Anemia    PLAN: recheck EKG.  Hope to find rhythm strips with NSVT vs. Artifact Dr. Tresa Endo to review.    INGOLD,Wayne [name] 07/26/2011, 4:39 PM       Patient seen and examined. Agree with assessment and plan.  35 yo AAM with a history of htn since 1996, diabetes, depression who was admitted following a self-inflicted overdose/suicide attempt.  Echo in 2010 has shown nl  lv fxn with EF 60% and mild LVH.. ECG here shows NSR with probable early repolarization changes common in AA males.  The abnormal rhythm strip is artifact ( if real rates would be in excess of 700 bpm and would have led to fibrillation).  No chest pain or sob presently.  QTc interval is normal.  Ok to transfer to psychiatry.   Lennette Bihari, MD, [facility] 07/26/2011 6:07 PM

## 2011-07-26 NOTE — Progress Notes (Signed)
Inpatient Diabetes Program Recommendations  AACE/ADA: New Consensus Statement on Inpatient Glycemic Control (2009)  Target Ranges:  Prepandial:   less than 140 mg/dL      Peak postprandial:   less than 180 mg/dL (1-2 hours)      Critically ill patients:  140 - 180 mg/dL   Reason for Visit: Hyperglycemia  Inpatient Diabetes Program Recommendations Insulin - Basal: Increase Lantus to 25 units QHS (Pt on Lantus 45 units QHS at home)  Note: CBGs 251, 151, 168, 242

## 2011-07-26 NOTE — Progress Notes (Signed)
UR CHART REVIEWED; B Sam Overbeck RN, BSN, MHA 

## 2011-07-26 NOTE — Progress Notes (Signed)
Subjective: Requested CPAP sleep apnea.  Objective: Weight change:   Intake/Output Summary (Last 24 hours) at 07/26/11 1932 Last data filed at 07/26/11 1800  Gross per 24 hour  Intake   2610 ml  Output      0 ml  Net   2610 ml   Pt alert afebrile comfortable  cvs s1 and s2 heard  Lungs clear  abd soft mild tenderness in the epigastric area.  Ext: no edema.   Lab Results: No new labs  Micro Results: Recent Results (from the past 240 hour(s))  MRSA PCR SCREENING     Status: Normal   Collection Time   07/25/11  3:42 AM      Component Value Range Status Comment   MRSA by PCR NEGATIVE  NEGATIVE  Final     Studies/Results: No results found. Medications: Scheduled Meds:   . enoxaparin  40 mg Subcutaneous Q24H  . glipiZIDE  10 mg Oral QAC breakfast  . insulin aspart  0-15 Units Subcutaneous TID WC  . insulin aspart  0-5 Units Subcutaneous QHS  . insulin aspart  3 Units Subcutaneous TID WC  . insulin glargine  25 Units Subcutaneous QHS  . loratadine  10 mg Oral Daily  . DISCONTD: insulin aspart  0-9 Units Subcutaneous Q4H  . DISCONTD: insulin glargine  25 Units Subcutaneous QHS   Continuous Infusions:   . sodium chloride 75 mL/hr at 07/26/11 1651  . DISCONTD: dextrose 5 % and 0.45% NaCl 75 mL/hr at 07/25/11 0500   PRN Meds:.acetaminophen, acetaminophen, dextrose, ondansetron (ZOFRAN) IV, ondansetron, oxyCODONE  Assessment/Plan: .Drug overdose: Overdose of metformin, haldol and zoloft, pt stated he took 3 days worth of medications . EKG does not show qt prolongation. He is asymptomatic . His Tele monitor this after noon showed HR of 156, with rhythm saying V TACH,doubt very much its v tach, could be artifact,. [facility] concerned about this , will call cardiology for further recommendations. But he is medically clear to go [facility]  At this time. Awaiting bed.  .Metabolic encephalopathy: secondary to drug overdose. Resolved.  .Diabetes mellitus due to underlying condition with  complication: ;.l  On lantus and SSI  .DEPRESSION: Hold all antidepressive medications. Psychiatry on board and recommended [facility].  .Asthma: stable continue with bronchodilator therapy.  Marland KitchenHeartburn: will start him on po protonix.  .Acute renal failure: most likely secondary to dehydration. Resolved.  Marland KitchenHyponatremia: secondary to dehydration and hyperglycemia. Resolved.  .Anemia: normocytic.    LOS: 2 days   Wayne [name] 07/26/2011, 7:32 PM

## 2011-07-26 NOTE — Progress Notes (Signed)
Psych consult recommendation for [facility].  CSW spoke with [facility] admissions who reviewed pt's information.  [facility] stated that pt was not medically stable for there admission standards.  CSW informed physician.  Tamala Julian, LCSW

## 2011-07-27 LAB — GLUCOSE, CAPILLARY
Glucose-Capillary: 234 mg/dL — ABNORMAL HIGH (ref 70–99)
Glucose-Capillary: 174 mg/dL — ABNORMAL HIGH (ref 70–99)
Glucose-Capillary: 119 mg/dL — ABNORMAL HIGH (ref 70–99)
Glucose-Capillary: 240 mg/dL — ABNORMAL HIGH (ref 70–99)

## 2011-07-27 NOTE — Progress Notes (Signed)
Subjective: No complaints  Interval history: 36 year old male with h/o htn, dm, sleep apnea, depression, overdosed on medications in a suicidal attempt. Awaiting bed at [facility].   Objective: Weight change:   Intake/Output Summary (Last 24 hours) at 07/27/11 2122 Last data filed at 07/27/11 1700  Gross per 24 hour  Intake   1095 ml  Output      0 ml  Net   1095 ml   Pt alert afebrile comfortable  cvs s1 and s2 heard  Lungs clear  abd soft mild tenderness in the epigastric area.  Ext: no edema.   Lab Results: Results for orders placed during the hospital encounter of 07/24/11 (from the past 24 hour(s))  GLUCOSE, CAPILLARY     Status: Abnormal   Collection Time   07/27/11  4:26 AM      Component Value Range   Glucose-Capillary 207 (*) 70 - 99 (mg/dL)   Comment 1 Documented in Chart     Comment 2 Notify RN    GLUCOSE, CAPILLARY     Status: Abnormal   Collection Time   07/27/11  7:47 AM      Component Value Range   Glucose-Capillary 174 (*) 70 - 99 (mg/dL)  GLUCOSE, CAPILLARY     Status: Abnormal   Collection Time   07/27/11 11:49 AM      Component Value Range   Glucose-Capillary 119 (*) 70 - 99 (mg/dL)  GLUCOSE, CAPILLARY     Status: Abnormal   Collection Time   07/27/11  4:49 PM      Component Value Range   Glucose-Capillary 240 (*) 70 - 99 (mg/dL)     Micro Results: Recent Results (from the past 240 hour(s))  MRSA PCR SCREENING     Status: Normal   Collection Time   07/25/11  3:42 AM      Component Value Range Status Comment   MRSA by PCR NEGATIVE  NEGATIVE  Final     Studies/Results: No results found. Medications: Scheduled Meds:   . enoxaparin  40 mg Subcutaneous Q24H  . glipiZIDE  10 mg Oral QAC breakfast  . insulin aspart  0-15 Units Subcutaneous TID WC  . insulin aspart  0-5 Units Subcutaneous QHS  . insulin aspart  3 Units Subcutaneous TID WC  . insulin glargine  25 Units Subcutaneous QHS  . loratadine  10 mg Oral Daily   Continuous Infusions:   .  DISCONTD: sodium chloride Stopped (07/27/11 1200)   PRN Meds:.acetaminophen, acetaminophen, dextrose, ondansetron (ZOFRAN) IV, ondansetron, oxyCODONE  Assessment/Plan:  LOS: 3 days      .Drug overdose: Overdose of metformin, haldol and zoloft, pt stated he took 3 days worth of medications . EKG does not show qt prolongation. He is asymptomatic . His Tele monitor yesterday after noon showed HR of 156, with rhythm saying V TACH,doubt very much its v tach, could be artifact,. [facility] concerned about this , called cardiology, who felt the rhythm is artifact.  But he is medically clear to go [facility] At this time. Awaiting bed.  .Metabolic encephalopathy: secondary to drug overdose. Resolved.  .Diabetes mellitus due to underlying condition with complication: ;.l  On lantus and SSI  .DEPRESSION: Hold all antidepressive medications. Psychiatry on board and recommended [facility]  .Asthma: stable continue with bronchodilator therapy.  Marland KitchenHeartburn: will start him on po protonix.  .Acute renal failure: most likely secondary to dehydration. Resolved.  Marland KitchenHyponatremia: secondary to dehydration and hyperglycemia. Resolved.  .Anemia: normocytic.  Sleep apnea: on cpap  at night.    Monicka Cyran 07/27/2011, 9:22 PM

## 2011-07-28 LAB — GLUCOSE, CAPILLARY
Glucose-Capillary: 152 mg/dL — ABNORMAL HIGH (ref 70–99)
Glucose-Capillary: 211 mg/dL — ABNORMAL HIGH (ref 70–99)
Glucose-Capillary: 198 mg/dL — ABNORMAL HIGH (ref 70–99)

## 2011-07-28 MED ORDER — INSULIN ASPART 100 UNIT/ML ~~LOC~~ SOLN
5.0000 [IU] | Freq: Three times a day (TID) | SUBCUTANEOUS | Status: DC
  Administered 2011-07-28 – 2011-07-29 (×3): 5 [IU] via SUBCUTANEOUS

## 2011-07-28 MED ORDER — SALINE SPRAY 0.65 % NA SOLN
1.0000 | NASAL | Status: DC | PRN
  Administered 2011-07-29 – 2011-07-30 (×3): 1 via NASAL
  Filled 2011-07-28: qty 44

## 2011-07-28 NOTE — Progress Notes (Signed)
CBGs 07/27/2011: 207, 174, 119, 240, 152.  07/28/2011: 239.  Would benefit from slight increase in meal coverage insulin.  Increase Novolog to 5 units tidwc.  Pt eating 100% meals.

## 2011-07-28 NOTE — Progress Notes (Signed)
PATIENT DETAILS Name: Wayne [name] Age: 36 y.o. Sex: male Date of Birth: Jan 13, 1976 Admit Date: 07/24/2011 ZOX:[id number] Jonny Ruiz, MD, MD POA:   CONSULTS: 1.  Dr. Nicki Guadalajara, Cardiology 2.  Dr. Ardelle Balls, Psychiatry  Interval History: Wayne [name] is a 36 year old male who was admitted to the hospital on 07/24/2011 after overdosing on metformin, Haldol, and Zoloft. He was lethargic on initial presentation and admitted to suicidal thoughts. He was monitored in the step down unit for his toxic encephalopathy, which rapidly resolved. He was seen by psychiatry on 07/25/2011 with recommendations for inpatient psychiatric stabilization, and is currently awaiting an inpatient psychiatric bed. Cardiology was also consult and when the patient developed a wide complex tachycardia, however, the abnormalities seen on telemetry were felt to be artifactual. He is medically stable for transfer.  ROS: Wayne [name] is complaining of headaches and dry, irritated nasal passages. Other than that, he has no specific complaints. His mood is better. He does not want to go for inpatient psychiatric treatment.   Objective: Vital signs in last 24 hours: Temp:  [97.4 F (36.3 C)-98.3 F (36.8 C)] 98.1 F (36.7 C) (01/02 1334) Pulse Rate:  [57-77] 77  (01/02 1334) Resp:  [16-18] 18  (01/02 1334) BP: (127-133)/(85-86) 127/85 mmHg (01/02 1334) SpO2:  [98 %] 98 % (01/02 1334) Weight change:  Last BM Date: 07/27/11  Intake/Output from previous day:  Intake/Output Summary (Last 24 hours) at 07/28/11 1532 Last data filed at 07/28/11 1300  Gross per 24 hour  Intake    720 ml  Output      0 ml  Net    720 ml     Physical Exam:  Gen:  NAD Cardiovascular:  RRR, No M/R/G Respiratory: Lungs CTAB Gastrointestinal: Abdomen soft, NT/ND with normal active bowel sounds. Extremities: No C/E/C     Lab Results: Basic Metabolic Panel:  Lab 07/25/11 5409 07/24/11 2345 07/24/11 0456  NA 135 137 133*  K 3.9 3.6  --  CL 103 104 95*  CO2 24 24 25   GLUCOSE 240* 126* 499*  BUN 22 28* 22  CREATININE 1.12 1.56* 1.52*  CALCIUM 8.9 9.0 9.6  MG -- -- --  PHOS -- -- --   GFR Estimated Creatinine Clearance: 104 ml/min (by C-G formula based on Cr of 1.12). Liver Function Tests:  Lab 07/25/11 0650 07/24/11 0456  AST 20 30  ALT 21 22  ALKPHOS 87 222*  BILITOT 0.3 0.3  PROT 6.5 7.6  ALBUMIN 3.3* 4.2   No results found for this basename: LIPASE:5,AMYLASE:5 in the last 168 hours No results found for this basename: AMMONIA:5 in the last 168 hours Coagulation profile  Lab 07/25/11 0650  INR 0.97  PROTIME --    CBC:  Lab 07/25/11 0650 07/24/11 0456  WBC 7.0 6.9  NEUTROABS -- --  HGB 12.5* 12.7*  HCT 34.5* 36.2*  MCV 88.7 87.9  PLT 144* 167   Cardiac Enzymes:  Lab 07/25/11 1520 07/25/11 0650 07/24/11 2300  CKTOTAL 184 173 183  CKMB 1.7 1.7 1.9  CKMBINDEX -- -- --  TROPONINI <0.30 <0.30 <0.30   BNP: No components found with this basename: POCBNP:5 CBG:  Lab 07/28/11 1204 07/28/11 0756 07/27/11 2154 07/27/11 1649 07/27/11 1149  GLUCAP 165* 239* 152* 240* 119*    Ref. Range 07/24/2011 22:59  Hemoglobin A1C Latest Range: <5.7 % 10.9 (H)    Ref. Range 07/24/2011 22:59  TSH Latest Range: 0.350-4.500 uIU/mL 0.301 (L)   Microbiology Recent Results (from  the past 240 hour(s))  MRSA PCR SCREENING     Status: Normal   Collection Time   07/25/11  3:42 AM      Component Value Range Status Comment   MRSA by PCR NEGATIVE  NEGATIVE  Final     Recent Results (from the past 240 hour(s))  MRSA PCR SCREENING     Status: Normal   Collection Time   07/25/11  3:42 AM      Component Value Range Status Comment   MRSA by PCR NEGATIVE  NEGATIVE  Final     Studies/Results: No results found.  Medications: Scheduled Meds:   . enoxaparin  40 mg Subcutaneous Q24H  . glipiZIDE  10 mg Oral QAC breakfast  . insulin aspart  0-15 Units Subcutaneous TID WC  . insulin aspart  0-5 Units  Subcutaneous QHS  . insulin aspart  3 Units Subcutaneous TID WC  . insulin glargine  25 Units Subcutaneous QHS  . loratadine  10 mg Oral Daily   Continuous Infusions:  PRN Meds:.acetaminophen, acetaminophen, dextrose, ondansetron (ZOFRAN) IV, ondansetron, oxyCODONE Antibiotics: Anti-infectives    None       Assessment/Plan:  Principal Problem:  *Drug overdose/ Toxic encephalopathy Patient admits to an intentional overdose of metformin, haldol and zoloft, having taken approximately 3 days worth of medications . EKG did not show qt prolongation. He was asymptomatic, except for some initial toxic encephalopathy, which has cleared. Because of some questionable wide complex tachycardia, a cardiology consultation was requested for her other clearance to inpatient psychiatry. Telemetry strips were felt to be artifactual and this was confirmed by cardiology. The patient is medically cleared for transfer to behavioral health when a bed is available. Active Problems:  SUBSTANCE ABUSE, MULTIPLE Patient admits to recent cocaine use. He also has a history of marijuana and alcohol abuse. Will likely need these issues further addressed at his inpatient psychiatric treatment program.  DEPRESSION Will need further medication stabilization and treatment per psychiatry. At this time, psychiatry as not recommending medication management in acute care setting.  Asthma Patient's asthma and has been stable. He can continue when necessary bronchodilator therapy.  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 Patient has been seen and evaluated by the diabetes coordinator. Recommendations have been noted and patient's insulin adjusted accordingly. His hemoglobin A1c was 10.9% indicating poor outpatient control.  Acute renal failure Patient's renal failure has resolved and his creatinine has normalized.  Hyponatremia Likely secondary to dehydration.  Resolved.  Anemia Mild. No further inpatient workup deemed necessary. Likely related to chronic disease.   LOS: 4 days   Hillery Aldo, MD Pager [phone]  07/28/2011, 3:32 PM

## 2011-07-28 NOTE — Progress Notes (Signed)
Per [facility], no beds available today.  Providence Crosby, LCSWA Clinical Social Work [phone]

## 2011-07-29 DIAGNOSIS — R7989 Other specified abnormal findings of blood chemistry: Secondary | ICD-10-CM | POA: Diagnosis present

## 2011-07-29 LAB — GLUCOSE, CAPILLARY
Glucose-Capillary: 172 mg/dL — ABNORMAL HIGH (ref 70–99)
Glucose-Capillary: 226 mg/dL — ABNORMAL HIGH (ref 70–99)
Glucose-Capillary: 130 mg/dL — ABNORMAL HIGH (ref 70–99)

## 2011-07-29 MED ORDER — LISINOPRIL 20 MG PO TABS
20.0000 mg | ORAL_TABLET | Freq: Every day | ORAL | Status: DC
  Administered 2011-07-30: 20 mg via ORAL
  Filled 2011-07-29 (×2): qty 1

## 2011-07-29 MED ORDER — INSULIN ASPART 100 UNIT/ML ~~LOC~~ SOLN
7.0000 [IU] | Freq: Three times a day (TID) | SUBCUTANEOUS | Status: DC
  Administered 2011-07-29 – 2011-07-30 (×3): 7 [IU] via SUBCUTANEOUS

## 2011-07-29 MED ORDER — AMLODIPINE BESYLATE 10 MG PO TABS
10.0000 mg | ORAL_TABLET | Freq: Every day | ORAL | Status: DC
  Administered 2011-07-29 – 2011-07-30 (×2): 10 mg via ORAL
  Filled 2011-07-29 (×2): qty 1

## 2011-07-29 NOTE — Progress Notes (Signed)
Per [facility], unsure of bed status.  [facility] to contact CSW later today with bed status.  Providence Crosby, LCSWA Clinical Social Work [phone]

## 2011-07-29 NOTE — Progress Notes (Addendum)
PATIENT DETAILS Name: Wayne [name] Age: 36 y.o. Sex: male Date of Birth: 10-26-1975 Admit Date: 07/24/2011 JYN:[id number] Jonny Ruiz, MD, MD POA:   CONSULTS: 1.  Dr. Nicki Guadalajara, Cardiology 2.  Dr. Ardelle Balls, Psychiatry  Interval History: Wayne [name] is a 36 year old male who was admitted to the hospital on 07/24/2011 after overdosing on metformin, Haldol, and Zoloft. He was lethargic on initial presentation and admitted to suicidal thoughts. He was monitored in the step down unit for his toxic encephalopathy, which rapidly resolved. He was seen by psychiatry on 07/25/2011 with recommendations for inpatient psychiatric stabilization, and is currently awaiting an inpatient psychiatric bed. Cardiology was also consult and when the patient developed a wide complex tachycardia, however, the abnormalities seen on telemetry were felt to be artifactual. He is medically stable for transfer.  ROS: Wayne [name] continues to complain of headaches and dry, irritated nasal passages. Other than that, he has no specific complaints. His mood is better. He does not want to go for inpatient psychiatric treatment.  He thinks his physical problems may have caused him to become depressed.  Objective: Vital signs in last 24 hours: Temp:  [97.5 F (36.4 C)-98.5 F (36.9 C)] 97.7 F (36.5 C) (01/03 1357) Pulse Rate:  [61-73] 73  (01/03 1357) Resp:  [18-19] 18  (01/03 1357) BP: (138-177)/(87-114) 177/114 mmHg (01/03 1357) SpO2:  [97 %-99 %] 98 % (01/03 1357) Weight change:  Last BM Date: 07/29/11  Intake/Output from previous day:  Intake/Output Summary (Last 24 hours) at 07/29/11 1609 Last data filed at 07/29/11 1300  Gross per 24 hour  Intake   1560 ml  Output      0 ml  Net   1560 ml     Physical Exam:  Gen:  NAD Cardiovascular:  RRR, No M/R/G Respiratory: Lungs CTAB Gastrointestinal: Abdomen soft, NT/ND with normal active bowel sounds. Extremities: No C/E/C     Lab Results: Basic Metabolic  Panel:  Lab 07/25/11 0650 07/24/11 2345 07/24/11 0456  NA 135 137 133*  K 3.9 3.6 --  CL 103 104 95*  CO2 24 24 25   GLUCOSE 240* 126* 499*  BUN 22 28* 22  CREATININE 1.12 1.56* 1.52*  CALCIUM 8.9 9.0 9.6  MG -- -- --  PHOS -- -- --   GFR Estimated Creatinine Clearance: 104 ml/min (by C-G formula based on Cr of 1.12). Liver Function Tests:  Lab 07/25/11 0650 07/24/11 0456  AST 20 30  ALT 21 22  ALKPHOS 87 222*  BILITOT 0.3 0.3  PROT 6.5 7.6  ALBUMIN 3.3* 4.2   Coagulation profile  Lab 07/25/11 0650  INR 0.97  PROTIME --    CBC:  Lab 07/25/11 0650 07/24/11 0456  WBC 7.0 6.9  NEUTROABS -- --  HGB 12.5* 12.7*  HCT 34.5* 36.2*  MCV 88.7 87.9  PLT 144* 167   Cardiac Enzymes:  Lab 07/25/11 1520 07/25/11 0650 07/24/11 2300  CKTOTAL 184 173 183  CKMB 1.7 1.7 1.9  CKMBINDEX -- -- --  TROPONINI <0.30 <0.30 <0.30    CBG:  Lab 07/29/11 1152 07/29/11 0750 07/28/11 2050 07/28/11 1646 07/28/11 1204  GLUCAP 172* 175* 198* 211* 165*    Ref. Range 07/24/2011 22:59  Hemoglobin A1C Latest Range: <5.7 % 10.9 (H)    Ref. Range 07/24/2011 22:59  TSH Latest Range: 0.350-4.500 uIU/mL 0.301 (L)   Microbiology Recent Results (from the past 240 hour(s))  MRSA PCR SCREENING     Status: Normal   Collection Time  07/25/11  3:42 AM      Component Value Range Status Comment   MRSA by PCR NEGATIVE  NEGATIVE  Final     Recent Results (from the past 240 hour(s))  MRSA PCR SCREENING     Status: Normal   Collection Time   07/25/11  3:42 AM      Component Value Range Status Comment   MRSA by PCR NEGATIVE  NEGATIVE  Final     Studies/Results: No results found.  Medications: Scheduled Meds:    . amLODipine  10 mg Oral Daily  . enoxaparin  40 mg Subcutaneous Q24H  . glipiZIDE  10 mg Oral QAC breakfast  . insulin aspart  0-15 Units Subcutaneous TID WC  . insulin aspart  0-5 Units Subcutaneous QHS  . insulin aspart  5 Units Subcutaneous TID WC  . insulin glargine  25  Units Subcutaneous QHS  . loratadine  10 mg Oral Daily   Continuous Infusions:  PRN Meds:.acetaminophen, acetaminophen, dextrose, ondansetron (ZOFRAN) IV, ondansetron, oxyCODONE, sodium chloride Antibiotics: Anti-infectives    None       Assessment/Plan:  Principal Problem:  *Drug overdose/ Toxic encephalopathy Patient admits to an intentional overdose of metformin, haldol and zoloft, having taken approximately 3 days worth of medications. EKG did not show qt prolongation. He was asymptomatic, except for some initial toxic encephalopathy, which has cleared. Because of some questionable wide complex tachycardia, a cardiology consultation was requested for her other clearance to inpatient psychiatry. Telemetry strips were felt to be artifactual and this was confirmed by cardiology. The patient is medically cleared for transfer to behavioral health when a bed is available.  His mood and affect remain stable. Active Problems:  SUBSTANCE ABUSE, MULTIPLE Patient admits to recent cocaine use. He also has a history of marijuana and alcohol abuse. Will likely need these issues further addressed at his inpatient psychiatric treatment program.  DEPRESSION Will need further medication stabilization and treatment per psychiatry. At this time, psychiatry as not recommending medication management in acute care setting.  Asthma Patient's asthma and has been stable. He can continue when necessary bronchodilator therapy.  Diabetes mellitus due to underlying condition with complication CBGs 165-211 over the past 24 hours. Patient has been seen and evaluated by the diabetes coordinator. Recommendations have been noted and patient's insulin adjusted accordingly. Given his ongoing hyperglycemia, will increase his meal coverage to 7 units Q AC. His hemoglobin A1c was 10.9% indicating poor outpatient control.  Acute renal failure Patient's renal failure has resolved and his creatinine has normalized.   Hyponatremia Likely secondary to dehydration. Resolved.  Anemia Mild. No further inpatient workup deemed necessary. Likely related to chronic disease.  Hypertension The patient's blood pressures have been elevated. We resumed Norvasc and lisinopril on 07/29/11.    Low TSH level Check free T4.   LOS: 5 days   Hillery Aldo, MD Pager [phone]  07/29/2011, 4:09 PM

## 2011-07-30 ENCOUNTER — Inpatient Hospital Stay (HOSPITAL_COMMUNITY)
Admission: AD | Admit: 2011-07-30 | Discharge: 2011-08-03 | DRG: 897 | Disposition: A | Discharge status: Home / Self Care | Payer: No Typology Code available for payment source | Attending: Psychiatry | Admitting: Psychiatry

## 2011-07-30 ENCOUNTER — Encounter (HOSPITAL_COMMUNITY): Payer: Self-pay | Admitting: *Deleted

## 2011-07-30 DIAGNOSIS — F339 Major depressive disorder, recurrent, unspecified: Secondary | ICD-10-CM | POA: Diagnosis present

## 2011-07-30 DIAGNOSIS — IMO0002 Reserved for concepts with insufficient information to code with codable children: Secondary | ICD-10-CM

## 2011-07-30 DIAGNOSIS — I10 Essential (primary) hypertension: Secondary | ICD-10-CM

## 2011-07-30 DIAGNOSIS — M1990 Unspecified osteoarthritis, unspecified site: Secondary | ICD-10-CM

## 2011-07-30 DIAGNOSIS — F17200 Nicotine dependence, unspecified, uncomplicated: Secondary | ICD-10-CM

## 2011-07-30 DIAGNOSIS — E785 Hyperlipidemia, unspecified: Secondary | ICD-10-CM

## 2011-07-30 DIAGNOSIS — K219 Gastro-esophageal reflux disease without esophagitis: Secondary | ICD-10-CM

## 2011-07-30 DIAGNOSIS — Z79899 Other long term (current) drug therapy: Secondary | ICD-10-CM

## 2011-07-30 DIAGNOSIS — G609 Hereditary and idiopathic neuropathy, unspecified: Secondary | ICD-10-CM

## 2011-07-30 DIAGNOSIS — R45851 Suicidal ideations: Secondary | ICD-10-CM

## 2011-07-30 DIAGNOSIS — F329 Major depressive disorder, single episode, unspecified: Secondary | ICD-10-CM

## 2011-07-30 DIAGNOSIS — F1994 Other psychoactive substance use, unspecified with psychoactive substance-induced mood disorder: Secondary | ICD-10-CM

## 2011-07-30 DIAGNOSIS — R51 Headache: Secondary | ICD-10-CM

## 2011-07-30 DIAGNOSIS — F3289 Other specified depressive episodes: Secondary | ICD-10-CM

## 2011-07-30 DIAGNOSIS — F1910 Other psychoactive substance abuse, uncomplicated: Principal | ICD-10-CM

## 2011-07-30 DIAGNOSIS — E119 Type 2 diabetes mellitus without complications: Secondary | ICD-10-CM

## 2011-07-30 DIAGNOSIS — M542 Cervicalgia: Secondary | ICD-10-CM

## 2011-07-30 DIAGNOSIS — J45909 Unspecified asthma, uncomplicated: Secondary | ICD-10-CM

## 2011-07-30 DIAGNOSIS — G4733 Obstructive sleep apnea (adult) (pediatric): Secondary | ICD-10-CM

## 2011-07-30 DIAGNOSIS — Z794 Long term (current) use of insulin: Secondary | ICD-10-CM

## 2011-07-30 DIAGNOSIS — Z7982 Long term (current) use of aspirin: Secondary | ICD-10-CM

## 2011-07-30 LAB — BASIC METABOLIC PANEL
BUN: 13 mg/dL (ref 6–23)
Calcium: 9.5 mg/dL (ref 8.4–10.5)
GFR calc Af Amer: >90 mL/min (ref >90)
GFR calc non Af Amer: >90 mL/min (ref >90)
Glucose, Bld: 336 mg/dL — ABNORMAL HIGH (ref 70–99)
Potassium: 3.5 mEq/L (ref 3.5–5.1)
Sodium: 133 mEq/L — ABNORMAL LOW (ref 135–145)

## 2011-07-30 LAB — GLUCOSE, CAPILLARY: Glucose-Capillary: 177 mg/dL — ABNORMAL HIGH (ref 70–99)

## 2011-07-30 MED ORDER — PANTOPRAZOLE SODIUM 40 MG PO TBEC
80.0000 mg | DELAYED_RELEASE_TABLET | Freq: Every day | ORAL | Status: DC
  Administered 2011-07-30 – 2011-08-03 (×5): 80 mg via ORAL
  Filled 2011-07-30 (×6): qty 2

## 2011-07-30 MED ORDER — OMEGA-3-ACID ETHYL ESTERS 1 G PO CAPS
2.0000 g | ORAL_CAPSULE | Freq: Every day | ORAL | Status: DC
  Administered 2011-07-31 – 2011-08-03 (×4): 2 g via ORAL
  Filled 2011-07-30 (×3): qty 2
  Filled 2011-07-30: qty 14
  Filled 2011-07-30 (×2): qty 2

## 2011-07-30 MED ORDER — INSULIN GLARGINE 100 UNIT/ML ~~LOC~~ SOLN
45.0000 [IU] | Freq: Every day | SUBCUTANEOUS | Status: DC
  Administered 2011-07-30 – 2011-08-02 (×4): 45 [IU] via SUBCUTANEOUS
  Filled 2011-07-30: qty 3

## 2011-07-30 MED ORDER — INSULIN GLARGINE 100 UNIT/ML ~~LOC~~ SOLN: 45.0000 [IU] | Freq: Every day | SUBCUTANEOUS | Status: DC

## 2011-07-30 MED ORDER — IBUPROFEN 400 MG PO TABS
400.0000 mg | ORAL_TABLET | Freq: Four times a day (QID) | ORAL | Status: DC | PRN
  Administered 2011-07-30 – 2011-08-02 (×6): 400 mg via ORAL
  Filled 2011-07-30 (×6): qty 1

## 2011-07-30 MED ORDER — SALINE SPRAY 0.65 % NA SOLN
1.0000 | NASAL | Status: DC | PRN
  Filled 2011-07-30: qty 44

## 2011-07-30 MED ORDER — ASPIRIN EC 81 MG PO TBEC
81.0000 mg | DELAYED_RELEASE_TABLET | Freq: Every day | ORAL | Status: DC
  Administered 2011-07-31 – 2011-08-03 (×4): 81 mg via ORAL
  Filled 2011-07-30 (×6): qty 1

## 2011-07-30 MED ORDER — INSULIN ASPART PROT & ASPART (70-30 MIX) 100 UNIT/ML ~~LOC~~ SUSP: 10.0000 [IU] | Freq: Three times a day (TID) | SUBCUTANEOUS | Status: DC

## 2011-07-30 MED ORDER — NICOTINE 21 MG/24HR TD PT24
21.0000 mg | MEDICATED_PATCH | Freq: Every day | TRANSDERMAL | Status: DC
  Administered 2011-07-31 – 2011-08-03 (×4): 21 mg via TRANSDERMAL
  Filled 2011-07-30 (×7): qty 1

## 2011-07-30 MED ORDER — INSULIN ASPART 100 UNIT/ML ~~LOC~~ SOLN
10.0000 [IU] | Freq: Three times a day (TID) | SUBCUTANEOUS | Status: DC
  Administered 2011-07-31 – 2011-08-03 (×11): 10 [IU] via SUBCUTANEOUS
  Filled 2011-07-30: qty 3

## 2011-07-30 MED ORDER — METFORMIN HCL 500 MG PO TABS
1000.0000 mg | ORAL_TABLET | Freq: Two times a day (BID) | ORAL | Status: DC
  Administered 2011-07-31 – 2011-08-03 (×7): 1000 mg via ORAL
  Filled 2011-07-30 (×11): qty 2

## 2011-07-30 MED ORDER — OMEGA-3 FATTY ACIDS 1000 MG PO CAPS: 2.0000 g | ORAL_CAPSULE | Freq: Every day | ORAL | Status: DC

## 2011-07-30 MED ORDER — AMLODIPINE BESYLATE 10 MG PO TABS: 10.0000 mg | ORAL_TABLET | Freq: Every day | ORAL | Status: DC

## 2011-07-30 MED ORDER — AMLODIPINE BESYLATE 10 MG PO TABS
10.0000 mg | ORAL_TABLET | Freq: Every day | ORAL | Status: DC
  Administered 2011-07-31 – 2011-08-03 (×4): 10 mg via ORAL
  Filled 2011-07-30 (×6): qty 1

## 2011-07-30 MED ORDER — NAPROXEN 500 MG PO TABS
500.0000 mg | ORAL_TABLET | Freq: Two times a day (BID) | ORAL | Status: DC
  Administered 2011-07-31 – 2011-08-03 (×7): 500 mg via ORAL
  Filled 2011-07-30 (×11): qty 1

## 2011-07-30 MED ORDER — ADULT MULTIVITAMIN W/MINERALS CH
1.0000 | ORAL_TABLET | Freq: Every day | ORAL | Status: DC
  Administered 2011-07-31 – 2011-08-03 (×4): 1 via ORAL
  Filled 2011-07-30 (×5): qty 1

## 2011-07-30 MED ORDER — MAGNESIUM HYDROXIDE 400 MG/5ML PO SUSP: 30.0000 mL | Freq: Every day | ORAL | Status: DC | PRN

## 2011-07-30 MED ORDER — ACETAMINOPHEN 325 MG PO TABS
650.0000 mg | ORAL_TABLET | Freq: Four times a day (QID) | ORAL | Status: DC | PRN
  Administered 2011-07-30 – 2011-08-02 (×2): 650 mg via ORAL

## 2011-07-30 MED ORDER — GLIPIZIDE ER 5 MG PO TB24
10.0000 mg | ORAL_TABLET | Freq: Every day | ORAL | Status: DC | PRN
  Administered 2011-07-30 – 2011-08-03 (×5): 10 mg via ORAL
  Filled 2011-07-30 (×5): qty 2

## 2011-07-30 MED ORDER — LORATADINE 10 MG PO TABS: 10.0000 mg | ORAL_TABLET | Freq: Every day | ORAL | Status: DC

## 2011-07-30 MED ORDER — LISINOPRIL 20 MG PO TABS
20.0000 mg | ORAL_TABLET | Freq: Every day | ORAL | Status: DC
  Administered 2011-07-31 – 2011-08-03 (×4): 20 mg via ORAL
  Filled 2011-07-30 (×6): qty 1

## 2011-07-30 MED ORDER — ALUM & MAG HYDROXIDE-SIMETH 200-200-20 MG/5ML PO SUSP: 30.0000 mL | ORAL | Status: DC | PRN

## 2011-07-30 MED ORDER — INSULIN ASPART 100 UNIT/ML ~~LOC~~ SOLN: 10.0000 [IU] | Freq: Three times a day (TID) | SUBCUTANEOUS | Status: DC

## 2011-07-30 MED ORDER — MULTIVITAMINS PO CAPS: 1.0000 | ORAL_CAPSULE | Freq: Every day | ORAL | Status: DC

## 2011-07-30 MED ORDER — SALINE SPRAY 0.65 % NA SOLN: 1.0000 | NASAL | Status: DC | PRN

## 2011-07-30 MED ORDER — LORATADINE 10 MG PO TABS
10.0000 mg | ORAL_TABLET | Freq: Every day | ORAL | Status: DC
  Administered 2011-07-31 – 2011-08-03 (×4): 10 mg via ORAL
  Filled 2011-07-30 (×6): qty 1

## 2011-07-30 NOTE — Progress Notes (Signed)
Bed available at [facility].  Notified MD and RN.  MD to to d/c summary.  RN to give report.  Pt signed [facility] admission documents.  Faxed documents to [facility].  Pt to be dc'd.  Providence Crosby, LCSWA Clinical Social Work [phone]

## 2011-07-30 NOTE — Progress Notes (Signed)
PATIENT DETAILS Name: Square Providence Crosby Age: 36 y.o. Sex: male Date of Birth: 12-May-1976 Admit Date: 07/24/2011 ZOX:[id number] Jonny Ruiz, MD, MD POA:   CONSULTS: 1.  Dr. Nicki Guadalajara, Cardiology 2.  Dr. Ardelle Balls, Psychiatry  Interval History: Mr. Grasmick is a 36 year old male who was admitted to the hospital on 07/24/2011 after overdosing on metformin, Haldol, and Zoloft. He was lethargic on initial presentation and admitted to suicidal thoughts. He was monitored in the step down unit for his toxic encephalopathy, which rapidly resolved. He was seen by psychiatry on 07/25/2011 with recommendations for inpatient psychiatric stabilization, and is currently awaiting an inpatient psychiatric bed. Cardiology was also consult and when the patient developed a wide complex tachycardia, however, the abnormalities seen on telemetry were felt to be artifactual. He is medically stable for transfer.  ROS: Mr. Cuny denies headache today.  Continues to feel well and denies any current suicidal feelings and is anxious to go home.  Objective: Vital signs in last 24 hours: Temp:  [97.7 F (36.5 C)-98.4 F (36.9 C)] 98.4 F (36.9 C) (01/04 0609) Pulse Rate:  [73-76] 76  (01/04 0609) Resp:  [16-18] 18  (01/04 0609) BP: (125-177)/(80-114) 138/80 mmHg (01/04 0931) SpO2:  [98 %-100 %] 98 % (01/04 0609) Weight change:  Last BM Date: 07/29/11  Intake/Output from previous day:  Intake/Output Summary (Last 24 hours) at 07/30/11 1251 Last data filed at 07/30/11 0745  Gross per 24 hour  Intake    960 ml  Output      0 ml  Net    960 ml     Physical Exam:  Gen:  NAD Cardiovascular:  RRR, No M/R/G Respiratory: Lungs CTAB Gastrointestinal: Abdomen soft, NT/ND with normal active bowel sounds. Extremities: No C/E/C     Lab Results: Basic Metabolic Panel:  Lab 07/30/11 5409 07/25/11 0650 07/24/11 2345 07/24/11 0456  NA 133* 135 137 133*  K 3.5 3.9 -- --  CL 99 103 104 95*  CO2 29 24 24 25   GLUCOSE  336* 240* 126* 499*  BUN 13 22 28* 22  CREATININE 0.92 1.12 1.56* 1.52*  CALCIUM 9.5 8.9 9.0 9.6  MG -- -- -- --  PHOS -- -- -- --   GFR Estimated Creatinine Clearance: 126.7 ml/min (by C-G formula based on Cr of 0.92). Liver Function Tests:  Lab 07/25/11 0650 07/24/11 0456  AST 20 30  ALT 21 22  ALKPHOS 87 222*  BILITOT 0.3 0.3  PROT 6.5 7.6  ALBUMIN 3.3* 4.2   Coagulation profile  Lab 07/25/11 0650  INR 0.97  PROTIME --    CBC:  Lab 07/25/11 0650 07/24/11 0456  WBC 7.0 6.9  NEUTROABS -- --  HGB 12.5* 12.7*  HCT 34.5* 36.2*  MCV 88.7 87.9  PLT 144* 167   Cardiac Enzymes:  Lab 07/25/11 1520 07/25/11 0650 07/24/11 2300  CKTOTAL 184 173 183  CKMB 1.7 1.7 1.9  CKMBINDEX -- -- --  TROPONINI <0.30 <0.30 <0.30    CBG:  Lab 07/30/11 1145 07/30/11 0739 07/29/11 2049 07/29/11 1634 07/29/11 1152  GLUCAP 177* 298* 130* 226* 172*    Ref. Range 07/24/2011 22:59  Hemoglobin A1C Latest Range: <5.7 % 10.9 (H)    Ref. Range 07/24/2011 22:59  TSH Latest Range: 0.350-4.500 uIU/mL 0.301 (L)    Ref. Range 07/30/2011 04:16  Free T4 Latest Range: 0.80-1.80 ng/dL 8.11   Microbiology Recent Results (from the past 240 hour(s))  MRSA PCR SCREENING     Status: Normal  Collection Time   07/25/11  3:42 AM      Component Value Range Status Comment   MRSA by PCR NEGATIVE  NEGATIVE  Final     Recent Results (from the past 240 hour(s))  MRSA PCR SCREENING     Status: Normal   Collection Time   07/25/11  3:42 AM      Component Value Range Status Comment   MRSA by PCR NEGATIVE  NEGATIVE  Final     Studies/Results: No results found.  Medications: Scheduled Meds:    . amLODipine  10 mg Oral Daily  . enoxaparin  40 mg Subcutaneous Q24H  . glipiZIDE  10 mg Oral QAC breakfast  . insulin aspart  0-15 Units Subcutaneous TID WC  . insulin aspart  0-5 Units Subcutaneous QHS  . insulin aspart  7 Units Subcutaneous TID WC  . insulin glargine  25 Units Subcutaneous QHS  .  lisinopril  20 mg Oral Daily  . loratadine  10 mg Oral Daily  . DISCONTD: insulin aspart  5 Units Subcutaneous TID WC   Continuous Infusions:  PRN Meds:.acetaminophen, acetaminophen, dextrose, ondansetron (ZOFRAN) IV, ondansetron, oxyCODONE, sodium chloride Antibiotics: Anti-infectives    None       Assessment/Plan:  Principal Problem:  *Drug overdose/ Toxic encephalopathy Patient admits to an intentional overdose of metformin, haldol and zoloft, having taken approximately 3 days worth of medications. EKG did not show qt prolongation. He was asymptomatic, except for some initial toxic encephalopathy, which has cleared. Because of some questionable wide complex tachycardia, a cardiology consultation was requested for her other clearance to inpatient psychiatry. Telemetry strips were felt to be artifactual and this was confirmed by cardiology. The patient is medically cleared for transfer to behavioral health when a bed is available.  His mood and affect remain stable.  He is still awaiting transfer to inpatient psychiatry, but no bed currently available. Active Problems:  SUBSTANCE ABUSE, MULTIPLE Patient admits to recent cocaine use. He also has a history of marijuana and alcohol abuse. Will likely need these issues further addressed at his inpatient psychiatric treatment program.  DEPRESSION Will need further medication stabilization and treatment per psychiatry. At this time, psychiatry as not recommending medication management in acute care setting.  Asthma Patient's asthma and has been stable. He can continue when necessary bronchodilator therapy.  Diabetes mellitus due to underlying condition with complication CBGs 130-298 over the past 24 hours. Patient has been seen and evaluated by the diabetes coordinator. Recommendations have been noted and patient's insulin adjusted accordingly. Given his ongoing hyperglycemia, will increase his meal coverage to 10 units Q AC and his Lantus to 45  units daily, which is his home regimen.   His hemoglobin A1c was 10.9% indicating poor outpatient control.  Acute renal failure Patient's renal failure has resolved and his creatinine has normalized.  Hyponatremia Likely secondary to dehydration. Resolved.  Anemia Mild. No further inpatient workup deemed necessary. Likely related to chronic disease.  Hypertension The patient's blood pressures have been elevated. We resumed Norvasc and lisinopril on 07/29/11.    Low TSH level Free T4 is normal.   LOS: 6 days   Hillery Aldo, MD Pager [phone]  07/30/2011, 12:51 PM

## 2011-07-30 NOTE — Progress Notes (Signed)
Pt refused to wear CPAP tonight.  Pt encouraged to notify RN/ RT of any concerns.

## 2011-07-30 NOTE — Progress Notes (Signed)
[facility] unsure of bed status.  CSW to continue to follow.  Amanda Gabreal Worton, LCSWA Clinical Social Work [phone]  

## 2011-07-30 NOTE — Progress Notes (Signed)
Per [facility], still unsure of bed status.  CSW to continue to follow.  Providence Crosby, LCSWA Clinical Social Work [phone]
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
## 2011-07-30 NOTE — Discharge Summary (Signed)
Physician Discharge Summary  Patient ID: Wayne [name] MRN: [id number] DOB/AGE: Dec 13, 1975 36 y.o.  Admit date: 07/24/2011 Discharge date: 07/30/2011  Primary Care Physician:  Oliver Barre, MD, MD   Discharge Diagnoses:    Present on Admission:  .Drug overdose .Toxic encephalopathy .Diabetes mellitus due to underlying condition with complication .DEPRESSION .Asthma .Heartburn .SUBSTANCE ABUSE, MULTIPLE .Acute renal failure .Hyponatremia .Anemia .Low TSH level  Discharge Medications:  Current Discharge Medication List    START taking these medications   Details  amLODipine (NORVASC) 10 MG tablet Take 1 tablet (10 mg total) by mouth daily.    loratadine (CLARITIN) 10 MG tablet Take 1 tablet (10 mg total) by mouth daily.    sodium chloride (OCEAN) 0.65 % SOLN nasal spray Place 1 spray into the nose as needed for congestion.      CONTINUE these medications which have CHANGED   Details  insulin glargine (LANTUS) 100 UNIT/ML injection Inject 45 Units into the skin at bedtime. Qty: 10 mL      CONTINUE these medications which have NOT CHANGED   Details  aspirin EC 81 MG tablet Take 81 mg by mouth daily.      esomeprazole (NEXIUM) 40 MG capsule Take 1 capsule (40 mg total) by mouth daily. Qty: 90 capsule, Refills: 3    fish oil-omega-3 fatty acids 1000 MG capsule Take 2 g by mouth daily.      glipiZIDE (GLUCOTROL XL) 10 MG 24 hr tablet 1 tab per day for blood sugar more than 200 Qty: 30 tablet, Refills: 11    Glucosamine 500 MG TABS Take 1 tablet by mouth daily.      glucose blood (ONE TOUCH ULTRA TEST) test strip PRN Qty: 100 each, Refills: 5   Associated Diagnoses: Type II or unspecified type diabetes mellitus without mention of complication, not stated as uncontrolled    insulin lispro (HUMALOG) 100 UNIT/ML injection Inject 10 Units into the skin 3 (three) times daily before meals.      Lancets (ONETOUCH ULTRASOFT) lancets PRN Qty: 100 each, Refills: 5   Associated Diagnoses: Type II or unspecified type diabetes mellitus without mention of complication, not stated as uncontrolled    lisinopril (PRINIVIL,ZESTRIL) 20 MG tablet Take 1 tablet (20 mg total) by mouth daily. Qty: 90 tablet, Refills: 3    metFORMIN (GLUCOPHAGE) 500 MG tablet Take 2 tablets (1,000 mg total) by mouth 2 (two) times daily with a meal. Qty: 120 tablet, Refills: 11   Associated Diagnoses: Type II or unspecified type diabetes mellitus without mention of complication, not stated as uncontrolled    Multiple Vitamin (MULTIVITAMIN) capsule Take 1 capsule by mouth daily.      naproxen (EC NAPROSYN) 500 MG EC tablet Take 1 tablet (500 mg total) by mouth 2 (two) times daily with a meal. Qty: 60 tablet, Refills: 5   Associated Diagnoses: Neck pain      STOP taking these medications     buPROPion (WELLBUTRIN SR) 150 MG 12 hr tablet      Cholecalciferol (VITAMIN D) 1000 UNITS capsule      cyclobenzaprine (FLEXERIL) 5 MG tablet      haloperidol (HALDOL) 10 MG tablet      hydrOXYzine (VISTARIL) 25 MG capsule      sertraline (ZOLOFT) 100 MG tablet      zolpidem (AMBIEN) 10 MG tablet          Disposition and Follow-up: The patient is being discharged to [facility].  He is instructed to follow  up with his PCP in 3 weeks.  Consults:  1. Dr. Nicki Guadalajara, Cardiology  2. Dr. Ardelle Balls, Psychiatry   Significant Diagnostic Studies:  No results found.  Discharge Laboratory Values: Basic Metabolic Panel:  Lab 07/30/11 2130 07/25/11 0650 07/24/11 2345 07/24/11 0456  NA 133* 135 137 133*  K 3.5 3.9 -- --  CL 99 103 104 95*  CO2 29 24 24 25   GLUCOSE 336* 240* 126* 499*  BUN 13 22 28* 22  CREATININE 0.92 1.12 1.56* 1.52*  CALCIUM 9.5 8.9 9.0 9.6  MG -- -- -- --  PHOS -- -- -- --   GFR Estimated Creatinine Clearance: 126.7 ml/min (by C-G formula based on Cr of 0.92). Liver Function Tests:  Lab 07/25/11 0650 07/24/11 0456  AST 20 30  ALT  21 22  ALKPHOS 87 222*  BILITOT 0.3 0.3  PROT 6.5 7.6  ALBUMIN 3.3* 4.2   Coagulation profile  Lab 07/25/11 0650  INR 0.97  PROTIME --    CBC:  Lab 07/25/11 0650 07/24/11 0456  WBC 7.0 6.9  NEUTROABS -- --  HGB 12.5* 12.7*  HCT 34.5* 36.2*  MCV 88.7 87.9  PLT 144* 167   Cardiac Enzymes:  Lab 07/25/11 1520 07/25/11 0650 07/24/11 2300  CKTOTAL 184 173 183  CKMB 1.7 1.7 1.9  CKMBINDEX -- -- --  TROPONINI <0.30 <0.30 <0.30   CBG:  Lab 07/30/11 1145 07/30/11 0739 07/29/11 2049 07/29/11 1634 07/29/11 1152  GLUCAP 177* 298* 130* 226* 172*    Ref. Range  07/24/2011 22:59   Hemoglobin A1C  Latest Range: <5.7 %  10.9 (H)     Ref. Range  07/24/2011 22:59   TSH  Latest Range: 0.350-4.500 uIU/mL  0.301 (L)     Ref. Range  07/30/2011 04:16   Free T4  Latest Range: 0.80-1.80 ng/dL  8.65    Microbiology Recent Results (from the past 240 hour(s))  MRSA PCR SCREENING     Status: Normal   Collection Time   07/25/11  3:42 AM      Component Value Range Status Comment   MRSA by PCR NEGATIVE  NEGATIVE  Final      Brief H and P: For complete details please refer to admission H and P, but in brief, Wayne [name] is a 36 year old male who was admitted to the hospital on 07/24/2011 after overdosing on metformin, Haldol, and Zoloft. He was lethargic on initial presentation and admitted to suicidal thoughts. He was referred to the hospitalist service for medical stabilization and psychiatry consultation.   Physical Exam at Discharge: BP 138/80  Pulse 76  Temp(Src) 98.4 F (36.9 C) (Oral)  Resp 18  Ht 6\' 1"  (1.854 m)  Wt 92.2 kg (203 lb 4.2 oz)  BMI 26.82 kg/m2  SpO2 98% Gen: NAD  Cardiovascular: RRR, No M/R/G  Respiratory: Lungs CTAB  Gastrointestinal: Abdomen soft, NT/ND with normal active bowel sounds.  Extremities: No C/E/C   Hospital Course:  Principal Problem:  *Drug overdose/ Toxic encephalopathy  Patient admits to an intentional overdose of metformin, haldol and  zoloft, having taken approximately 3 days worth of medications. EKG did not show qt prolongation. He was asymptomatic, except for some initial toxic encephalopathy, which has cleared. Because of some questionable wide complex tachycardia, a cardiology consultation was requested for her other clearance to inpatient psychiatry. Telemetry strips were felt to be artifactual and this was confirmed by cardiology. The patient is medically cleared for transfer to behavioral health when a bed  is available. His mood and affect remain stable. He is still awaiting transfer to inpatient psychiatry, which will be done today. Active Problems:  SUBSTANCE ABUSE, MULTIPLE  Patient admits to recent cocaine use. He also has a history of marijuana and alcohol abuse. Will likely need these issues further addressed at his inpatient psychiatric treatment program.  DEPRESSION  Will need further medication stabilization and treatment per psychiatry. At this time, psychiatry as not recommending medication management in acute care setting.  Asthma  Patient's asthma and has been stable. He can continue when necessary bronchodilator therapy.  Diabetes mellitus due to underlying condition with complication  CBGs 130-298 over the past 24 hours. Patient has been seen and evaluated by the diabetes coordinator. Recommendations have been noted and patient's insulin adjusted accordingly. Given his ongoing hyperglycemia, will increase his meal coverage to 10 units Q AC and his Lantus to 45 units daily, which is his home regimen. His hemoglobin A1c was 10.9% indicating poor outpatient control.  Acute renal failure  Patient's renal failure has resolved and his creatinine has normalized.  Hyponatremia  Likely secondary to dehydration. Resolved.  Anemia  Mild. No further inpatient workup deemed necessary. Likely related to chronic disease.  Hypertension  The patient's blood pressures have been elevated. We resumed Norvasc and lisinopril on  07/29/11.  Low TSH level  Free T4 is normal.      Recommendations for hospital follow-up: 1.  Close follow up with PCP to evaluate glycemic control.  Diet: Carbohydrate modified.  Activity: No restrictions  Condition at Discharge: Stable, improved.  Time spent on Discharge: 35 minutes.  Signed: Dr. Trula Ore Rama Pager [phone] 07/30/2011, 2:00 PM

## 2011-07-30 NOTE — BH Assessment (Signed)
Assessment Note   Wayne [name] is an 36 y.o. male was admitted to [facility] after a overdose on prescription medication. Pt reported that he overdosed on Haldol and Zoloft, pt also reported that he had been using Cocaine.  Pt reported that he was depressed, and was feeling very hopeless and helpless. Patient reported that he had to move in with his parents after he was unable to to pay for rent due to low income. 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 Pt reported that he continues to feel very depressed, helpless, worthless, and sad. Pt reported having thoughts of wanting to hurt self.   Axis I: Major Depression, Recurrent severe Axis II: Deferred Axis III:  Past Medical History  Diagnosis Date  . Depression   . Diabetes mellitus   . Hyperlipidemia   . HTN (hypertension)   . Chronic headaches   . Osteoarthritis   . Kidney stones   . Polysubstance abuse     cocaine, marijuana-quit summer 2010  . Tobacco abuse   . Asthma   . Chest pain   . Lumbar disc disease   . GERD (gastroesophageal reflux disease)   . OSA (obstructive sleep apnea)   . Peripheral neuropathy 02/01/2011   Axis IV: economic problems, housing problems and occupational problems Axis V: 61-70 mild symptoms  Past Medical History:  Past Medical History  Diagnosis Date  . Depression   . Diabetes mellitus   . Hyperlipidemia   . HTN (hypertension)   . Chronic headaches   . Osteoarthritis   . Kidney stones   . Polysubstance abuse     cocaine, marijuana-quit summer 2010  . Tobacco abuse   . Asthma   . Chest pain   . Lumbar disc disease   . GERD (gastroesophageal reflux disease)   . OSA (obstructive sleep apnea)   . Peripheral neuropathy 02/01/2011    Past Surgical History  Procedure Date  . Knee surgery   . Bunionectomy 06-2009    Family History:  Family History  Problem Relation Age of Onset  . Heart disease Mother   . Diabetes  Father   . Hyperlipidemia Father   . Emphysema Maternal Uncle   . Asthma Sister   . Asthma Mother   . Stroke Mother   . Hypertension Father   . Alzheimer's disease Maternal Grandmother   . Depression Father   . Breast cancer Maternal Aunt   . COPD Mother     maternal un    Social History:  reports that he quit smoking about 5 months ago. His smoking use included Cigarettes. He has a 3.4 pack-year smoking history. He has quit using smokeless tobacco. He reports that he drinks alcohol. He reports that he uses illicit drugs.  Additional Social History:  Alcohol / Drug Use Pain Medications: none noted Prescriptions: none noted Over the Counter: none noted History of alcohol / drug use?: Yes Negative Consequences of Use: Financial Substance #1 Name of Substance 1: Cocaine and THC 1 - Age of First Use: unknown 1 - Amount (size/oz): unknown 1 - Frequency: daily 1 - Duration: years 1 - Last Use / Amount: a week ago  Allergies:  Allergies  Allergen Reactions  . Iodine     rash  . Nsaids     Abdominal cramping    Home Medications:  Medications Prior to Admission  Medication Dose Route Frequency Provider Last Rate Last  Dose  . DISCONTD: acetaminophen (TYLENOL) suppository 650 mg  650 mg Rectal Q6H PRN Vijaya Akula      . DISCONTD: acetaminophen (TYLENOL) tablet 650 mg  650 mg Oral Q6H PRN Vijaya Akula   650 mg at 07/30/11 0713  . DISCONTD: amLODipine (NORVASC) tablet 10 mg  10 mg Oral Daily Christina Rama   10 mg at 07/30/11 0932  . DISCONTD: dextrose 50 % solution 25 mL  25 mL Intravenous PRN Lyanne Co, MD      . DISCONTD: enoxaparin (LOVENOX) injection 40 mg  40 mg Subcutaneous Q24H Vijaya Akula   40 mg at 07/29/11 2334  . DISCONTD: glipiZIDE (GLUCOTROL) tablet 10 mg  10 mg Oral QAC breakfast Vijaya Akula   10 mg at 07/30/11 0802  . DISCONTD: insulin aspart (novoLOG) injection 0-15 Units  0-15 Units Subcutaneous TID WC Vijaya Akula   3 Units at 07/30/11 1240  . DISCONTD:  insulin aspart (novoLOG) injection 0-5 Units  0-5 Units Subcutaneous QHS Vijaya Akula   0 Units at 07/27/11 2154  . DISCONTD: insulin aspart (novoLOG) injection 10 Units  10 Units Subcutaneous TID WC Christina Rama      . DISCONTD: insulin aspart (novoLOG) injection 5 Units  5 Units Subcutaneous TID WC Christina Rama   5 Units at 07/29/11 1214  . DISCONTD: insulin aspart (novoLOG) injection 7 Units  7 Units Subcutaneous TID WC Christina Rama   7 Units at 07/30/11 1239  . DISCONTD: insulin glargine (LANTUS) injection 25 Units  25 Units Subcutaneous QHS Vijaya Akula   25 Units at 07/29/11 2335  . DISCONTD: insulin glargine (LANTUS) injection 45 Units  45 Units Subcutaneous QHS Christina Rama      . DISCONTD: lisinopril (PRINIVIL,ZESTRIL) tablet 20 mg  20 mg Oral Daily Christina Rama   20 mg at 07/30/11 0932  . DISCONTD: loratadine (CLARITIN) tablet 10 mg  10 mg Oral Daily Vijaya Akula   10 mg at 07/30/11 0931  . DISCONTD: ondansetron (ZOFRAN) injection 4 mg  4 mg Intravenous Q6H PRN Vijaya Akula      . DISCONTD: ondansetron (ZOFRAN) tablet 4 mg  4 mg Oral Q6H PRN Vijaya Akula      . DISCONTD: oxyCODONE (Oxy IR/ROXICODONE) immediate release tablet 5 mg  5 mg Oral Q4H PRN Vijaya Akula   5 mg at 07/29/11 2336  . DISCONTD: sodium chloride (OCEAN) 0.65 % nasal spray 1 spray  1 spray Each Nare PRN Christina Rama   1 spray at 07/30/11 0151   Medications Prior to Admission  Medication Sig Dispense Refill  . amLODipine (NORVASC) 10 MG tablet Take 1 tablet (10 mg total) by mouth daily.      [name] aspirin EC 81 MG tablet Take 81 mg by mouth daily.        [name] esomeprazole (NEXIUM) 40 MG capsule Take 1 capsule (40 mg total) by mouth daily.  90 capsule  3  . fish oil-omega-3 fatty acids 1000 MG capsule Take 2 g by mouth daily.        [name] glipiZIDE (GLUCOTROL XL) 10 MG 24 hr tablet 1 tab per day for blood sugar more than 200  30 tablet  11  . Glucosamine 500 MG TABS Take 1 tablet by mouth daily.        [name] glucose blood  (ONE TOUCH ULTRA TEST) test strip PRN  100 each  5  . insulin glargine (LANTUS) 100 UNIT/ML injection Inject 45 Units into the skin at bedtime.  10  mL    . insulin lispro (HUMALOG) 100 UNIT/ML injection Inject 10 Units into the skin 3 (three) times daily before meals.        . Lancets (ONETOUCH ULTRASOFT) lancets PRN  100 each  5  . lisinopril (PRINIVIL,ZESTRIL) 20 MG tablet Take 1 tablet (20 mg total) by mouth daily.  90 tablet  3  . loratadine (CLARITIN) 10 MG tablet Take 1 tablet (10 mg total) by mouth daily.      . metFORMIN (GLUCOPHAGE) 500 MG tablet Take 2 tablets (1,000 mg total) by mouth 2 (two) times daily with a meal.  120 tablet  11  . Multiple Vitamin (MULTIVITAMIN) capsule Take 1 capsule by mouth daily.        . naproxen (EC NAPROSYN) 500 MG EC tablet Take 1 tablet (500 mg total) by mouth 2 (two) times daily with a meal.  60 tablet  5  . sodium chloride (OCEAN) 0.65 % SOLN nasal spray Place 1 spray into the nose as needed for congestion.        OB/GYN Status:  No LMP for male patient.  General Assessment Data Location of Assessment: [facility] Services Living Arrangements: Parent Can pt return to current living arrangement?: Yes Admission Status: Voluntary Is patient capable of signing voluntary admission?: Yes Transfer from: Acute Hospital Referral Source: Medical Floor Inpatient  Education Status Is patient currently in school?: No  Risk to self Suicidal Ideation: Yes-Currently Present Suicidal Intent: Yes-Currently Present Is patient at risk for suicide?: Yes Suicidal Plan?: No Access to Means: Yes Previous Attempts/Gestures: Yes How many times?: 1  Depression: Yes  Risk to Others Homicidal Ideation: No Thoughts of Harm to Others: No Current Homicidal Intent: No Current Homicidal Plan: No  Psychosis Hallucinations: None noted Delusions: None noted  Mental Status Report Appear/Hygiene: Improved Eye Contact: Fair Speech: Soft Level of Consciousness:  Alert Mood: Helpless Affect: Depressed Anxiety Level: None Thought Processes: Coherent Judgement: Impaired Orientation: Person;Place;Time;Situation Obsessive Compulsive Thoughts/Behaviors: None  Cognitive Functioning Concentration: Normal Memory: Recent Intact;Remote Intact IQ: Average Insight: Fair Impulse Control: Fair Appetite: Fair  Prior Inpatient Therapy Prior Inpatient Therapy: Yes Prior Therapy Dates: 2012 Prior Therapy Facilty/Provider(s): [facility] Reason for Treatment: depression  Prior Outpatient Therapy Prior Outpatient Therapy: Yes Prior Therapy Dates: 2012 Prior Therapy Facilty/Provider(s): [facility] [place] Reason for Treatment: depression  ADL Screening (condition at time of admission) Patient's cognitive ability adequate to safely complete daily activities?: Yes Patient able to express need for assistance with ADLs?: Yes Independently performs ADLs?: Yes Weakness of Legs: None Weakness of Arms/Hands: None  Home Assistive Devices/Equipment Home Assistive Devices/Equipment: None  Therapy Consults (therapy consults require a physician order) PT Evaluation Needed: No OT Evalulation Needed: No SLP Evaluation Needed: No Abuse/Neglect Assessment (Assessment to be complete while patient is alone) Physical Abuse: Denies Verbal Abuse: Denies Sexual Abuse: Denies Exploitation of patient/patient's resources: Denies Self-Neglect: Denies Values / Beliefs Cultural Requests During Hospitalization: None Spiritual Requests During Hospitalization: None     Nutrition Screen Diet: Carb modified Unintentional weight loss greater than 10lbs within the last month: No Dysphagia: No Home Tube Feeding or Total Parenteral Nutrition (TPN): No Patient appears severely malnourished: No Pregnant or Lactating: No Dietitian Consult Needed: No  Additional Information 1:1 In Past 12 Months?: No CIRT Risk: No Elopement Risk: No Does patient have medical clearance?: Yes      Disposition: Pt will be admitted to [facility] [address]     On Site Evaluation by:   Reviewed with Physician:  Jacquelyne Balint Shanta 07/30/2011 4:22 PM

## 2011-07-31 LAB — GLUCOSE, CAPILLARY
Glucose-Capillary: 192 mg/dL — ABNORMAL HIGH (ref 70–99)
Glucose-Capillary: 303 mg/dL — ABNORMAL HIGH (ref 70–99)
Glucose-Capillary: 277 mg/dL — ABNORMAL HIGH (ref 70–99)

## 2011-07-31 MED ORDER — GABAPENTIN 400 MG PO CAPS
400.0000 mg | ORAL_CAPSULE | Freq: Every day | ORAL | Status: DC
  Administered 2011-07-31 – 2011-08-02 (×3): 400 mg via ORAL
  Filled 2011-07-31 (×5): qty 1

## 2011-07-31 MED ORDER — LEVOFLOXACIN IN D5W 750 MG/150ML IV SOLN: 750.0000 mg | INTRAVENOUS | Status: DC

## 2011-07-31 MED ORDER — GABAPENTIN 100 MG PO CAPS
200.0000 mg | ORAL_CAPSULE | Freq: Three times a day (TID) | ORAL | Status: DC
  Administered 2011-07-31 – 2011-08-03 (×9): 200 mg via ORAL
  Filled 2011-07-31 (×14): qty 2

## 2011-07-31 MED ORDER — SERTRALINE HCL 100 MG PO TABS
200.0000 mg | ORAL_TABLET | Freq: Every day | ORAL | Status: DC
  Administered 2011-07-31 – 2011-08-03 (×4): 200 mg via ORAL
  Filled 2011-07-31 (×2): qty 2
  Filled 2011-07-31: qty 28
  Filled 2011-07-31 (×3): qty 2

## 2011-07-31 MED ORDER — BUPROPION HCL ER (SR) 100 MG PO TB12
100.0000 mg | ORAL_TABLET | Freq: Every day | ORAL | Status: DC
  Administered 2011-07-31 – 2011-08-03 (×4): 100 mg via ORAL
  Filled 2011-07-31 (×3): qty 1
  Filled 2011-07-31: qty 14
  Filled 2011-07-31 (×2): qty 1

## 2011-07-31 NOTE — H&P (Signed)
Psychiatric Admission Assessment Adult  Patient Identification:  Wayne [name] Date of Evaluation:  07/31/2011 36yo SAAM History of Present Illness:: Has been using cocaine and overdosed on his prescribed meds. 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 He lent his mom's car to a friend and his sister fussed him out. The car was returned undamaged but he still felt depressed worthless and hopeless.    Past Psychiatric History:Last admission was August here.   Substance Abuse History:  Social History:    reports that he quit smoking about 5 months ago. His smoking use included Cigarettes. He has a 3.4 pack-year smoking history. He has quit using smokeless tobacco. He reports that he drinks alcohol. He reports that he uses illicit drugs. UDS + Cocaine  Family Psych History:  Past Medical History:     Past Medical History  Diagnosis Date  . Depression   . Diabetes mellitus   . Hyperlipidemia   . HTN (hypertension)   . Chronic headaches   . Osteoarthritis   . Kidney stones   . Polysubstance abuse     cocaine, marijuana-quit summer 2010  . Tobacco abuse   . Asthma   . Chest pain   . Lumbar disc disease   . GERD (gastroesophageal reflux disease)   . OSA (obstructive sleep apnea)   . Peripheral neuropathy 02/01/2011       Past Surgical History  Procedure Date  . Knee surgery   . Bunionectomy 06-2009    Allergies:  Allergies  Allergen Reactions  . Iodine     rash  . Statins Other (See Comments)    Muscle weakness, stomach cramps  . Niacin And Related Rash    Current Medications:  Prior to Admission medications   Medication Sig Start Date End Date Taking? Authorizing Provider  amLODipine (NORVASC) 10 MG tablet Take 10 mg by mouth daily.  07/30/11 07/29/12  Orson Aloe  aspirin EC 81 MG tablet Take 81 mg by mouth daily.     Historical Provider, MD  esomeprazole  (NEXIUM) 40 MG capsule Take 1 capsule (40 mg total) by mouth daily. 05/21/11 05/20/12  Oliver Barre, MD  fish oil-omega-3 fatty acids 1000 MG capsule Take 2 g by mouth daily.      Historical Provider, MD  glipiZIDE (GLUCOTROL XL) 10 MG 24 hr tablet 1 tab per day for blood sugar more than 200 05/21/11   Oliver Barre, MD  Glucosamine 500 MG TABS Take 1 tablet by mouth daily.      Historical Provider, MD  glucose blood (ONE TOUCH ULTRA TEST) test strip PRN 05/21/11 05/20/12  Oliver Barre, MD  insulin glargine (LANTUS) 100 UNIT/ML injection Inject 45 Units into the skin at bedtime. 07/30/11   Christina Rama  insulin lispro (HUMALOG) 100 UNIT/ML injection Inject 10 Units into the skin 3 (three) times daily before meals.      Historical Provider, MD  Lancets [facility] ULTRASOFT) lancets PRN 05/21/11 05/20/12  Oliver Barre, MD  lisinopril (PRINIVIL,ZESTRIL) 20 MG tablet Take 1 tablet (20 mg total) by mouth daily. 04/19/11 04/18/12  Oliver Barre, MD  loratadine (CLARITIN) 10 MG tablet Take 1 tablet (10 mg total) by mouth daily. 07/30/11 07/29/12  Christina Rama  metFORMIN (GLUCOPHAGE) 500 MG tablet Take 2 tablets (1,000 mg total) by mouth 2 (two) times daily with a meal. 03/16/11   Oliver Barre, MD  Multiple  Vitamin (MULTIVITAMIN) capsule Take 1 capsule by mouth daily.      Historical Provider, MD  naproxen (EC NAPROSYN) 500 MG EC tablet Take 1 tablet (500 mg total) by mouth 2 (two) times daily with a meal. 05/21/11 05/20/12  Oliver Barre, MD  sodium chloride (OCEAN) 0.65 % SOLN nasal spray Place 1 spray into the nose as needed for congestion. 07/30/11   Christina Rama    Mental Status Examination/Evaluation: Objective:  Appearance: Well Groomed  Psychomotor Activity:  Normal  Eye Contact::  Good  Speech:  Normal Rate  Volume:  Normal  Mood: depressed    Affect:  Congruent  Thought Process:somewhat clear rational goal oriented -wants to be independent     Orientation:  Full  Thought Content:  Denies AVH or delusions     Suicidal Thoughts:  No not at this time   Homicidal Thoughts:  No  Judgement:  Impaired  Insight:  Shallow    DIAGNOSIS:    AXIS I Substance Abuse and Substance Induced Mood Disorder  AXIS II Deferred  AXIS III See medical history.  AXIS IV economic problems, housing problems, occupational problems and problems with primary support group  AXIS V 41-50 serious symptoms     Treatment Plan Summary: Wants to have different placement away from his mother.  Adjust meds as indicated   Agree with H&P from ED.

## 2011-07-31 NOTE — Progress Notes (Signed)
Pt has attended the group, participates and interacts with his peers. Shared in group that he has been living with a group of women (sister and grandmother) for a long time. States that he has learned to become very sensitive to his feelings and tends to cry easily when he feels bad.  Denies SI and HI. Given support and reassurance along with appropriate praise.

## 2011-07-31 NOTE — Progress Notes (Signed)
[facility] Group Notes:  (Counselor/Nursing/MHT/Case Management/Adjunct)  07/31/2011 10:30 AM  Type of Therapy:  After care Planning group Pt. attended after care planning group and was given [place] Suicide Prevention Information with crisis and hotline numbers to use. The patients agreed to use them if needed. The pt. Spoke about his past use of Cocaine and his issues of struggling  with Depression and SI thoughts. Pt. Stated  he was in hospital in August 2012.   Lamar Blinks Jeneen 07/31/2011, 10:30 AM

## 2011-07-31 NOTE — Progress Notes (Signed)
[facility] Group Notes:  (Counselor/Nursing/MHT/Case Management/Adjunct)  07/31/2011 4:23 PM  Type of Therapy:  Counseling Group  Participation Level:  Active  Participation Quality:  Appropriate  Affect:  Appropriate  Cognitive:  Appropriate  Insight:  Good  Engagement in Group:  Good  Engagement in Therapy:  Good  Modes of Intervention:  Clarification and Support  Summary of Progress/Problems: Pt. Participated in group session on self sabotaging behaviors and enabling. Each pt. Identified their self sabotaging behaviors and how to positively enable themselves to make the changes they need in their life. Pt. Spoke about not being able to take time for himself. Pt. Spoke about returning to school to find another career  Neila Gear 07/31/2011, 4:23 PM
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
## 2011-07-31 NOTE — Progress Notes (Signed)
Pt bright on approach, positive for group, denies SI/HI/hallucinations at this time.  Pt's blood sugar elevated but PT had eaten a snack before being tested.  Support and encouragement offered, will continue to monitor.

## 2011-07-31 NOTE — Progress Notes (Signed)
This pt pharmacological regimen has been continued accordingly from his prior to admission meds. Pt has been started on 45 units of lantus alone with a prn order of glipizide due to his cbg being greater than 200. Pt is well informed of his medical condition. Pt was also prescribed 400 mg of ibprofen for pain located in the posterior region of his head that wasn't relived with tylenol. The ibprofen along with a heat pack has been effective for this patient. Patient denies any thoughts of harming himself at this time. Pt has been observed mingling approprietly with others on the unit.

## 2011-07-31 NOTE — Progress Notes (Signed)
Adult Psychosocial Assessment Update Interdisciplinary Team  Previous [facility] admissions/discharges:  Admissions Discharges  Date: 02-27-11 Date: 03-09-11  Date: Date:  Date: Date:  Date: Date:  Date: Date:   Changes since the last Psychosocial Assessment (including adherence to outpatient mental health and/or substance abuse treatment, situational issues contributing to decompensation and/or relapse). Pt. Reports SI thoughts. Pt. Denies drug use but states that he overdosed on his prescribed medications. Pt. States that a argument with a friend  That triggered his SI attempt this time.             Discharge Plan 1. Will you be returning to the same living situation after discharge?   Yes: No:      If no, what is your plan?    Yes, the pt. Will return home to live with his mother. Pt. Would like to discuss housing and work options with a case manger  On 08-02-11.       2. Would you like a referral for services when you are discharged? Yes:     If yes, for what services?  No:       Yes, Pt. Is seen by Dr. Katrinka Blazing at [facility] located in [place]. Pt. Is seen by ACTT team.       Summary and Recommendations (to be completed by the evaluator) Pt is a 36 year old male admitted to [facility]  Depression  and SI attempt.  Pt would like to be referred back to his ACTT Team  For his follow up. Pt. Would like to discuss housing and work options with case manger. Pt. Recommendations include: Crisis Stabilization, Case Management, Group therapy, and Medication management.                       Signature:  Neila Gear, 07/31/2011 11:03 AM

## 2011-08-01 LAB — GLUCOSE, CAPILLARY
Glucose-Capillary: 242 mg/dL — ABNORMAL HIGH (ref 70–99)
Glucose-Capillary: 232 mg/dL — ABNORMAL HIGH (ref 70–99)

## 2011-08-01 MED ORDER — ZOLPIDEM TARTRATE 10 MG PO TABS
10.0000 mg | ORAL_TABLET | Freq: Every day | ORAL | Status: DC
  Administered 2011-08-01 – 2011-08-02 (×2): 10 mg via ORAL
  Filled 2011-08-01 (×5): qty 1

## 2011-08-01 NOTE — Progress Notes (Signed)
[facility] Group Notes:  (Counselor/Nursing/MHT/Case Management/Adjunct)  08/01/2011 4:37 PM Type of Therapy:  Counseling Group Therapy  Participation Level:  Active  Participation Quality:  Appropriate  Affect:  Appropriate  Cognitive:  Appropriate  Insight:  Good  Engagement in Group:  Good  Engagement in Therapy:  Good  Modes of Intervention:  Clarification and Support  Summary of Progress/Problems: Pt.  participated in group session on supports and how to find supports if they have none. The pt shared who their supports wee in their lives and identified unhealthy and healthy supports. Pt.'s were encouraged to have more than one support if their support persons were not able to be there for them. Pt. Spoke about his mother being a support but states they do not always agree on  Wayne [name] 08/01/2011, 4:37 PM

## 2011-08-01 NOTE — Progress Notes (Signed)
[facility] Adult Inpatient Family/Significant Other Suicide Prevention Education  Suicide Prevention Education:  Education Completed; Corrie Dandy [phone]) has been identified by the patient as the family member/significant other with whom the patient will be residing, and identified as the person(s) who will aid the patient in the event of a mental health crisis (suicidal ideations/suicide attempt).  With written consent from the patient, the family member/significant other has been provided the following suicide prevention education, prior to the and/or following the discharge of the patient.  The suicide prevention education provided includes the following:  Suicide risk factors  Suicide prevention and interventions  National Suicide Hotline telephone number  [facility] assessment telephone number  [facility] Emergency Assistance 911  [facility] and/or Residential Mobile Crisis Unit telephone number  Request made of family/significant other to:  Remove weapons (e.g., guns, rifles, knives), all items previously/currently identified as safety concern. The pt. States that there are no guns in the home.     Remove drugs/medications (over-the-counter, prescriptions, illicit drugs), all items previously/currently identified as a safety concern. Pt.'s mother will secure the pt.'s room and home and get help with removing all medication the pt. overdosed on in his SI attempt.  Pt.'s mother states this is the pt.'s second SI attempt and each SI attempt by the pt. was as the result of a argument between th e pt. And his sister. Pt.'s mother states that pt.'s sister call the pt. out when he is doing something he is not supposed to do.   The family member/significant other verbalizes understanding of the suicide prevention education information provided.  The family member/significant other agrees to remove the items of safety concern listed above.  Lamar Blinks  [place] 08/01/2011, 3:05 PM

## 2011-08-01 NOTE — Progress Notes (Signed)
Pt has attended the groups, interacts with his peers and participates. Tends to want to take over in groups and has to be gently redirected. Affect and mood are appropriate. Pt does talk about his home life and how hard it is for him to care for his 3 generation maternal side of his family. States he does all the cleaning, laundry, cooking etc.  Is insightful and is looking for answers to his issues. Given support, reassurance and praise.

## 2011-08-01 NOTE — Progress Notes (Signed)
Pt up and active in group, interacting appropriately with peers on unit.  Pt complains of continued neck pain, received Ibuprofen for this as ordered.  No acute distress noted, bright affect.  Denies SI/HI/hallucinations, support and encouragement offered.  Will continue to monitor.

## 2011-08-01 NOTE — Progress Notes (Signed)
  Wayne [name] is a 36 y.o. male [id number] Apr 22, 1976  07/30/2011 Active Problems:  DEPRESSION   Mental Status: alert & oriented mood is better denies SI/HI/AVH   Subjective/Objective: Says he slept well and can tell he is back on his meds.Wonders if he can afford Zoloft and will ask case manager to check with is ACT team.Says he had a good chat with his mother about learning to do something to get him out of the group home,     Filed Vitals:   08/01/11 0905  BP: 160/102  Pulse: 96  Temp:   Resp:     Lab Results:   BMET    Component Value Date/Time   NA 133* 07/30/2011 0416   K 3.5 07/30/2011 0416   CL 99 07/30/2011 0416   CO2 29 07/30/2011 0416   GLUCOSE 336* 07/30/2011 0416   BUN 13 07/30/2011 0416   CREATININE 0.92 07/30/2011 0416   CALCIUM 9.5 07/30/2011 0416   GFRNONAA >90 07/30/2011 0416   GFRAA >90 07/30/2011 0416    Medications:  Scheduled:     . amLODipine  10 mg Oral Daily  . aspirin EC  81 mg Oral Daily  . buPROPion  100 mg Oral Daily  . gabapentin  200 mg Oral TID  . gabapentin  400 mg Oral QHS  . insulin aspart  10 Units Subcutaneous TID WC  . insulin glargine  45 Units Subcutaneous QHS  . lisinopril  20 mg Oral Daily  . loratadine  10 mg Oral Daily  . metFORMIN  1,000 mg Oral BID WC  . mulitivitamin with minerals  1 tablet Oral Daily  . naproxen  500 mg Oral BID WC  . nicotine  21 mg Transdermal Q0600  . omega-3 acid ethyl esters  2 g Oral Daily  . pantoprazole  80 mg Oral Q1200  . sertraline  200 mg Oral Daily     PRN Meds acetaminophen, alum & mag hydroxide-simeth, glipiZIDE, ibuprofen, magnesium hydroxide, sodium chloride  Plan:No med changes today.   Aprille Sawhney,MICKIE D. 08/01/2011

## 2011-08-02 DIAGNOSIS — F329 Major depressive disorder, single episode, unspecified: Secondary | ICD-10-CM

## 2011-08-02 LAB — GLUCOSE, CAPILLARY
Glucose-Capillary: 162 mg/dL — ABNORMAL HIGH (ref 70–99)
Glucose-Capillary: 220 mg/dL — ABNORMAL HIGH (ref 70–99)

## 2011-08-02 NOTE — Progress Notes (Signed)
[facility] MD Progress Note  08/02/2011 1:25 PM  Patient reports during rounds "I am not depressed today. I feel like I am getting better. I slept well with the help of Ambien. I would like to be discharged on Ambien"  Diagnosis:   Axis I: Major depressive disorder, Hx. Polysubstance abuse Axis II: Deferred Axis III:  Past Medical History  Diagnosis Date  . Depression   . Diabetes mellitus   . Hyperlipidemia   . HTN (hypertension)   . Chronic headaches   . Osteoarthritis   . Kidney stones   . Polysubstance abuse     cocaine, marijuana-quit summer 2010  . Tobacco abuse   . Asthma   . Chest pain   . Lumbar disc disease   . GERD (gastroesophageal reflux disease)   . OSA (obstructive sleep apnea)   . Peripheral neuropathy 02/01/2011   Axis IV: No changes  ADL's:  Intact  Sleep:  4.75  Appetite:  Yes,  AEB: "my appetite is very good"  Suicidal Ideation: Denies  Plan:  No  Intent:  No  Means:  No  Homicidal Ideation: Denies  Plan:  No  Intent:  No  Means:  No    Mental Status: General Appearance /Behavior:  Casual Eye Contact:  Good Motor Behavior:  Normal Speech:  Normal Level of Consciousness:  Alert Mood:  "I do not feel depressed today, I feel I'm getting better" Affect:  Appropriate Anxiety Level:  Minimal Thought Process:  Coherent Thought Content:  WNL Perception:  Normal Judgment:  Fair Insight:  Present Cognition:  Orientation time, place and person Sleep:  Number of Hours: 4.75   Vital Signs:Blood pressure 156/101, pulse 89, temperature 96.9 F (36.1 C), temperature source Oral, resp. rate 18, height 6' (1.829 m), weight 93.441 kg (206 lb).  Lab Results:  Results for orders placed during the hospital encounter of 07/30/11 (from the past 48 hour(s))  GLUCOSE, CAPILLARY     Status: Abnormal   Collection Time   07/31/11  5:26 PM      Component Value Range Comment   Glucose-Capillary 277 (*) 70 - 99 (mg/dL)    Comment 1 Notify RN     GLUCOSE,  CAPILLARY     Status: Abnormal   Collection Time   07/31/11  8:52 PM      Component Value Range Comment   Glucose-Capillary 234 (*) 70 - 99 (mg/dL)    Comment 1 Notify RN     GLUCOSE, CAPILLARY     Status: Abnormal   Collection Time   08/01/11  6:16 AM      Component Value Range Comment   Glucose-Capillary 242 (*) 70 - 99 (mg/dL)   GLUCOSE, CAPILLARY     Status: Abnormal   Collection Time   08/01/11 11:28 AM      Component Value Range Comment   Glucose-Capillary 289 (*) 70 - 99 (mg/dL)   GLUCOSE, CAPILLARY     Status: Abnormal   Collection Time   08/01/11  5:22 PM      Component Value Range Comment   Glucose-Capillary 259 (*) 70 - 99 (mg/dL)    Comment 1 Notify RN     GLUCOSE, CAPILLARY     Status: Abnormal   Collection Time   08/01/11  9:03 PM      Component Value Range Comment   Glucose-Capillary 232 (*) 70 - 99 (mg/dL)   GLUCOSE, CAPILLARY     Status: Abnormal   Collection Time   08/02/11  6:11 AM      Component Value Range Comment   Glucose-Capillary 162 (*) 70 - 99 (mg/dL)   GLUCOSE, CAPILLARY     Status: Abnormal   Collection Time   08/02/11 11:50 AM      Component Value Range Comment   Glucose-Capillary 235 (*) 70 - 99 (mg/dL)     Physical Findings: AIMS:  , ,  ,  ,    CIWA:    COWS:     Treatment Plan Summary: Daily contact with patient to assess and evaluate symptoms and progress in treatment Medication management Continue group therapy.  Plan: Continue current current treatment.  Armandina Stammer I 08/02/2011, 1:25 PM
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
## 2011-08-02 NOTE — Progress Notes (Signed)
Recreation Therapy Group Note  Date: 08/02/2011         Time: 1145       Group Topic/Focus: Patient invited to participate in animal assisted therapy. Pets as a coping skill and responsibility were discussed.   Participation Level: Active  Participation Quality: Appropriate and Attentive  Affect: Appropriate  Cognitive: Appropriate and Oriented   Additional Comments: None  Wayne [name] 08/02/2011 12:44 PM 

## 2011-08-02 NOTE — Progress Notes (Signed)
Pt is very visible on the unit today. Denies SI or feeling hopeless. Rates depression at 2 and feeling anxious at 8. Pt will go back to live with mother after d/c. Patient does appear anxious at times. Reports that he is benefiting from sharing during the groups. Pt received a dose of prn glipizide at lunch for blood sugar of 235. He remains safe on the unit.

## 2011-08-02 NOTE — Progress Notes (Signed)
Patient seen during d/c planning group.  He advised of ODIng on pills following a disagreement with his sister.  Patient currently denies SI/HI and hopes to discharge home with mother soon.  He currently rates depression at two, anxiety at eight, and helplessness/hopelessness at zero.  He is followed outpatient by [facility].  Suicide prevention education reviewed.

## 2011-08-02 NOTE — Tx Team (Signed)
Interdisciplinary Treatment Plan Update (Adult)  Date:  08/02/2011  Time Reviewed:  10:21 AM   Progress in Treatment: Attending groups:   Yes   Participating in groups:  Yes Taking medication as prescribed:  Yes Tolerating medication:  Yes Family/Significant othe contact made:  Patient understands diagnosis:  Yes Discussing patient identified problems/goals with staff: Yes Medical problems stabilized or resolved: Yes Denies suicidal/homicidal ideation:Yes Issues/concerns per patient self-inventory: no Other:  New problem(s) identified: None  Reason for Continuation of Hospitalization: Anxiety Depression Medication stabilization  Interventions implemented related to continuation of hospitalization: Medication Management; safety checks q 15 mins Additional comments:  Estimated length of stay: 1-2 days  Discharge Plan: Pt will have follow up via in home with [facility] Tuesday Jan 8th 2013 at 3pm  New goal(s): N/A  Review of initial/current patient goals per problem list:   1.  Goal(s): Reduce depressive symptoms from 10 to 4  Met:  Yes  Target date:  As evidenced by: Roosevelt reports depression at   2.  Goal (s): Reduce anxiety from 10 to 3  Met:  No  Target date:discharge  As evidenced by: Pt rates anxiety at 7-8  3.  Goal(s):  Met:    Target date:  As evidenced by:  4.  Goal(s):  Met:    Target date:  As evidenced by:  Attendees: Patient:     Family:     Physician:  Orson Aloe, MD 08/02/2011 10:21 AM   Nursing:   Neill Loft, RN 08/02/2011 10:21 AM   CaseManager:  Juline Patch, LCSW 08/02/2011 10:21 AM   Counselor:  [name], LPCA 08/02/2011 10:21 AM   Other:  Consuello Bossier, NP 08/02/2011 10:21 AM   Other:  Reyes Ivan, LCSWA 08/02/2011 10:21 AM  Other:     Other:      Scribe for Treatment Team:   Wynn Banker, LCSW,  08/02/2011 10:21 AM

## 2011-08-03 ENCOUNTER — Ambulatory Visit: Payer: PRIVATE HEALTH INSURANCE | Admitting: Pulmonary Disease

## 2011-08-03 DIAGNOSIS — M542 Cervicalgia: Secondary | ICD-10-CM

## 2011-08-03 LAB — GLUCOSE, CAPILLARY: Glucose-Capillary: 240 mg/dL — ABNORMAL HIGH (ref 70–99)

## 2011-08-03 MED ORDER — SERTRALINE HCL 100 MG PO TABS: 200.0000 mg | ORAL_TABLET | Freq: Every day | ORAL | Status: DC

## 2011-08-03 MED ORDER — GABAPENTIN 300 MG PO CAPS: 300.0000 mg | ORAL_CAPSULE | Freq: Three times a day (TID) | ORAL | Status: DC

## 2011-08-03 MED ORDER — OMEGA-3-ACID ETHYL ESTERS 1 G PO CAPS: 2.0000 g | ORAL_CAPSULE | Freq: Every day | ORAL | Status: DC

## 2011-08-03 MED ORDER — BUPROPION HCL ER (SR) 100 MG PO TB12: 100.0000 mg | ORAL_TABLET | Freq: Every day | ORAL | Status: DC

## 2011-08-03 MED ORDER — GABAPENTIN 600 MG PO TABS
300.0000 mg | ORAL_TABLET | Freq: Three times a day (TID) | ORAL | Status: DC
  Filled 2011-08-03: qty 35

## 2011-08-03 MED ORDER — ZOLPIDEM TARTRATE 10 MG PO TABS: 10.0000 mg | ORAL_TABLET | Freq: Every day | ORAL | Status: AC

## 2011-08-03 NOTE — Progress Notes (Signed)
Grief and Loss Group  Group addressed losses related to relationships and self. Discussion focused on nature of losses, feelings experienced and how they have reacted to their grief. In the end of the group we processed how it is to come to terms with what cannot be changed and what it means to be open to the options and possibilities which exist now. Pt was active in group and supportive of others.  He focused on the losses and grief he feels over his life dream to become a Psychologist, occupational and realizes that life choices and circumstances and how he has reacted has impacted his life path. He wishes that he could repair some of the family relationships especially his relationship with his mother who he knows loves him and he wants to be able to be the " rock " for her that he use to be.  We talked about what it might mean to accept what he has to accept about himself and life now in order to discover the choices and options for the life he can create from this point  Carlyle Basques.Div B[employer] Director, [employer]

## 2011-08-03 NOTE — Tx Team (Signed)
Interdisciplinary Treatment Plan Update (Adult)  Date:  08/03/2011  Time Reviewed:  10:47 AM   Progress in Treatment: Attending groups: Yes Participating in groups:  Yes, open in processing and supportive to other group members Taking medication as prescribed:  Yes Tolerating medication: Yes Family/Significant othe contact made:  Yes, contact made with: mother, Corrie Dandy Patient understands diagnosis: Yes Discussing patient identified problems/goals with staff:  Yes Medical problems stabilized or resolved: Yes Denies suicidal/homicidal ideation: Yes Issues/concerns per patient self-inventory:  None identified Other:  New problem(s) identified: None  Reason for Continuation of Hospitalization: appropriate for discharge today   Interventions implemented related to continuation of hospitalization:  Medication stabilization, safety checks q 15 mins, group attendance  Additional comments:  Wayne [name] reports that he has recognized he needs to take better care of himself, rather than putting his own needs aside in order to help others. He states he has learned that he can still be kind and helpful without neglecting himself.   Estimated length of stay: discharge today  Discharge Plan: [name] will return home and be followed by ACTT from [facility]; will be transported with ACTT team and follow up with them at that time  New goal(s):  Review of initial/current patient goals per problem list:   1.  Goal(s): Reduce depressive symptoms from 10 to 4   Met:  Yes  Target date: by discharge  As evidenced by: Wayne [name] reports depression at a   2.  Goal (s): Reduce anxiety from a 10 to a 3  Met:  Yes  Target date: by discharge  As evidenced by: Wayne [name] rates anxiety at   3.  Goal(s): Reduce potential for self-harm  Met:  Yes  Target date: by discharge  As evidenced by: Wayne [name] reports no suicidal thoughts or self-harm thoughts today    Attendees: Patient:  Wayne [name] 08/03/2011 10:47 AM  Family:    08/03/2011 10:47 AM  Physician:  Dr Orson Aloe, MD 08/03/2011 10:47 AM  Nursing:   Neill Loft, RN 08/03/2011 10:47 AM  Case Manager:  Juline Patch, LCSW 08/03/2011 10:47 AM  Counselor:  Angus Palms, LCSW 08/03/2011 10:47 AM  Other:   08/03/2011 10:47 AM  Other:   08/03/2011 10:47 AM  Other:   08/03/2011 10:47 AM  Other:   08/03/2011 10:47 AM   Scribe for Treatment Team:   Billie Lade, 08/03/2011 10:47 AM

## 2011-08-03 NOTE — Progress Notes (Signed)
[facility] Case Management Discharge Plan:  Will you be returning to the same living situation after discharge: Yes Would you like a referral for services when you are discharged No Do you have access to transportation at discharge:Yes,  [facility] to provide transportation Do you have the ability to pay for your medications: No - [name] does not have coverage - he will be assisted with indigent meicaitons  Interagency Information:     Patient to Follow up at:  Follow-up Information    Follow up with [facility] on 08/03/2011. ([facility] will see you at home Tuesday, August 03, 2011 @ 3:00 p.m.)    Contact information:    [address] [place], [place]   [phone]         Patient denies SI/HI:   Yes,  Does not endorse SI today    Safety Planning and Suicide Prevention discussed:  Yes,  Reviewed during group on 08/01/10  Barrier to discharge identified:No.  Summary and Recommendations:  Wayne [name] was assisted with indigent medication.  Contact made with [facility] for transportation and follow up.   He reports plans to stop taking things personally   Wayne [name] 08/03/2011, 9:56 AM

## 2011-08-03 NOTE — Progress Notes (Signed)
Patient ID: Wayne [name], male   DOB: 10-24-1975, 36 y.o.   MRN: [id number] Patient asleep in bed, resting with eyes closed. Appears in no distress. Respirations even, normal and unlabored. Continue to monitor q15 minutes for safety.

## 2011-08-03 NOTE — Progress Notes (Signed)
Patient ID: Wayne [name], male   DOB: 03-Nov-1975, 36 y.o.   MRN: [id number] Pt denied SI/HI/AVH on discharge.  His belongings were returned.  Rx, medications, and discharge instructions were given and explained with emergency/crisis contact numbers.  [name] was escorted to the front entrance to his ACT Team for transportation by this RN

## 2011-08-03 NOTE — Progress Notes (Signed)
Slept well last nite, appetite is good, energy level is low ability to pay attention is improving, depressed 1/10 and hopeless 1/10, denies SI or Hi, wants to know when is he leaving here, taking meds as ordered, pleasant and cooperative, attending group q67min safety checks continue and support offeed Safety maintained

## 2011-08-03 NOTE — Discharge Summary (Signed)
Discharge Note  Patient:  Wayne [name] is an 36 y.o., male DOB:  [birth year]  Date of Admission:  07/30/2011  Date of Discharge:  08/03/2011  Level of Care:  OP  Discharge destination:  HOME  Is patient on multiple antipsychotic therapies at discharge:  NO  Patient phone:  [phone] (home) Patient address:   [address] [place] [address]  The patient received suicide prevention pamphlet:  YES Belongings returned:  Valuables  [place], Jerin Franzel 08/03/2011,11:54 AM

## 2011-08-03 NOTE — Progress Notes (Signed)
Suicide Risk Assessment  Discharge Assessment     Demographic factors:  Assessment Details Time of Assessment: Admission Information Obtained From: Patient Current Mental Status:  Current Mental Status:  (Denies) Risk Reduction Factors:  Risk Reduction Factors: Religious beliefs about death  CLINICAL FACTORS:   Severe Anxiety and/or Agitation Depression:   Anhedonia Comorbid alcohol abuse/dependence Hopelessness Alcohol/Substance Abuse/Dependencies Previous Psychiatric Diagnoses and Treatments Medical Diagnoses and Treatments/Surgeries  COGNITIVE FEATURES THAT CONTRIBUTE TO RISK:  Thought constriction (tunnel vision)    SUICIDE RISK:   Minimal: No identifiable suicidal ideation.  Patients presenting with no risk factors but with morbid ruminations; may be classified as minimal risk based on the severity of the depressive symptoms  Suicidal Ideation:   Plan:  No  Intent:  No  Means:  No  Homicidal Ideation:   Plan:  No  Intent:  No  Means:  No  Mental Status: General Appearance /Behavior:  Neat Eye Contact:  Good Motor Behavior:  Normal Speech:  Normal Level of Consciousness:  Alert Mood:  2 on a scale of 1 is the least and 10 is the most Affect:  Appropriate Anxiety Level:  3 on a scale of 1 is the least and 10 is the most Thought Process:  Coherent Thought Content:  WNL Perception:  Normal Judgment:  Good Insight:  Present Cognition:  Orientation time, place and person Concentration Yes Sleep:  Number of Hours: 6   Vital Signs:Blood pressure 149/103, pulse 89, temperature 96.3 F (35.7 C), temperature source Oral, resp. rate 18, height 6' (1.829 m), weight 93.441 kg (206 lb). Lab Results: Results for orders placed during the hospital encounter of 07/30/11 (from the past 48 hour(s))  GLUCOSE, CAPILLARY     Status: Abnormal   Collection Time   08/01/11  5:22 PM      Component Value Range Comment   Glucose-Capillary 259 (*) 70 - 99 (mg/dL)    Comment 1  Notify RN     GLUCOSE, CAPILLARY     Status: Abnormal   Collection Time   08/01/11  9:03 PM      Component Value Range Comment   Glucose-Capillary 232 (*) 70 - 99 (mg/dL)   GLUCOSE, CAPILLARY     Status: Abnormal   Collection Time   08/02/11  6:11 AM      Component Value Range Comment   Glucose-Capillary 162 (*) 70 - 99 (mg/dL)   GLUCOSE, CAPILLARY     Status: Abnormal   Collection Time   08/02/11 11:50 AM      Component Value Range Comment   Glucose-Capillary 235 (*) 70 - 99 (mg/dL)   GLUCOSE, CAPILLARY     Status: Abnormal   Collection Time   08/02/11  4:56 PM      Component Value Range Comment   Glucose-Capillary 220 (*) 70 - 99 (mg/dL)   GLUCOSE, CAPILLARY     Status: Abnormal   Collection Time   08/02/11 10:22 PM      Component Value Range Comment   Glucose-Capillary 162 (*) 70 - 99 (mg/dL)   GLUCOSE, CAPILLARY     Status: Abnormal   Collection Time   08/03/11  5:58 AM      Component Value Range Comment   Glucose-Capillary 196 (*) 70 - 99 (mg/dL)    Comment 1 Notify RN       Risk of harm to self is elevated by his history of mental illness and substance abuse, but he is seeking ways to help himself manage  his anxiety and depression and addictions through medications and counseling.  He seems to have learned more about taking care of himself and to stay away from abusable substances from this hospital stay.  Risk is elevated only because of his mental health and substance abuse history.  He denies being involved in altercations with others or having any legal charges of same.  As above has learned some better methods to cope and seems intent on applying them,  Man Effertz 08/03/2011, 11:45 AM

## 2011-08-03 NOTE — Discharge Summary (Signed)
Patient ID: Wayne [name] MRN: [id number] DOB/AGE: [birth year] 36 y.o.  Admit date: 07/30/2011 Discharge date: 08/03/2011  Reason for Admission: 36 y.o. male was admitted to [facility] after a overdose on prescription medication. Pt reported that he overdosed on Haldol and Zoloft, pt also reported that he had been using Cocaine. Pt reported that he was depressed, and was feeling very hopeless and helpless. Patient reported that he had to move in with his parents after he was unable to to pay for rent due to low income.  Hospital Course:  Pt was admitted and placed on his home meds of Wellbutrin and Zoloft and also on Neurontin for management of his anxiety and pain.  He noted good results from that.    He reported on discharge that he had learned that he needed to start taking care of himself and to stay away from abusable substances.    Discharge Diagnoses:  AXIS I Anxiety Disorder NOS, Depressive Disorder NOS and Substance Abuse  AXIS II Deferred  AXIS III Past Medical History  Diagnosis Date  . Depression   . Diabetes mellitus   . Hyperlipidemia   . HTN (hypertension)   . Chronic headaches   . Osteoarthritis   . Kidney stones   . Polysubstance abuse     cocaine, marijuana-quit summer 2010  . Tobacco abuse   . Asthma   . Chest pain   . Lumbar disc disease   . GERD (gastroesophageal reflux disease)   . OSA (obstructive sleep apnea)   . Peripheral neuropathy 02/01/2011    AXIS IV other psychosocial or environmental problems and problems with primary support group  AXIS V 51-60 moderate symptoms   Condition on  Discharge: Suicidal Ideation:   Plan:  No  Intent:  No  Means:  No  Homicidal Ideation:   Plan:  No  Intent:  No  Means:  No  Mental Status: General Appearance /Behavior:  Neat Eye Contact:  Good Motor Behavior:  Normal Speech:  Normal Level of Consciousness:  Alert Mood:  2 on a scale of 1 is the least and 10 is the most Affect:   Appropriate Anxiety Level:  3 on a scale of 1 is the least and 10 is the most Thought Process:  Coherent Thought Content:  WNL Perception:  Normal Judgment:  Good Insight:  Present Cognition:  Orientation time, place and person Concentration Yes Sleep:  Number of Hours: 6   Vital Signs:Blood pressure 149/103, pulse 89, temperature 96.3 F (35.7 C), temperature source Oral, resp. rate 18, height 6' (1.829 m), weight 93.441 kg (206 lb). Lab Results: Results for orders placed during the hospital encounter of 07/30/11 (from the past 48 hour(s))  GLUCOSE, CAPILLARY     Status: Abnormal   Collection Time   08/01/11  5:22 PM      Component Value Range Comment   Glucose-Capillary 259 (*) 70 - 99 (mg/dL)    Comment 1 Notify RN     GLUCOSE, CAPILLARY     Status: Abnormal   Collection Time   08/01/11  9:03 PM      Component Value Range Comment   Glucose-Capillary 232 (*) 70 - 99 (mg/dL)   GLUCOSE, CAPILLARY     Status: Abnormal   Collection Time   08/02/11  6:11 AM      Component Value Range Comment   Glucose-Capillary 162 (*) 70 - 99 (mg/dL)   GLUCOSE, CAPILLARY     Status: Abnormal   Collection Time  08/02/11 11:50 AM      Component Value Range Comment   Glucose-Capillary 235 (*) 70 - 99 (mg/dL)   GLUCOSE, CAPILLARY     Status: Abnormal   Collection Time   08/02/11  4:56 PM      Component Value Range Comment   Glucose-Capillary 220 (*) 70 - 99 (mg/dL)   GLUCOSE, CAPILLARY     Status: Abnormal   Collection Time   08/02/11 10:22 PM      Component Value Range Comment   Glucose-Capillary 162 (*) 70 - 99 (mg/dL)   GLUCOSE, CAPILLARY     Status: Abnormal   Collection Time   08/03/11  5:58 AM      Component Value Range Comment   Glucose-Capillary 196 (*) 70 - 99 (mg/dL)    Comment 1 Notify RN       Risk of harm to self is elevated by his history of mental illness and substance abuse, but he is seeking ways to help himself manage his anxiety and depression and addictions through medications  and counseling.  He seems to have learned more about taking care of himself and to stay away from abusable substances from this hospital stay.  Risk is elevated only because of his mental health and substance abuse history.  He denies being involved in altercations with others or having any legal charges of same.  As above has learned some better methods to cope and seems intent on applying them,  Plan:  Current Discharge Medication List    START taking these medications   Details  buPROPion (WELLBUTRIN SR) 100 MG 12 hr tablet Take 1 tablet (100 mg total) by mouth daily. Qty: 30 tablet, Refills: 0    gabapentin (NEURONTIN) 300 MG capsule Take 1 capsule (300 mg total) by mouth 3 (three) times daily. Qty: 150 capsule, Refills: 0    sertraline (ZOLOFT) 100 MG tablet Take 2 tablets (200 mg total) by mouth daily. Qty: 60 tablet, Refills: 0    zolpidem (AMBIEN) 10 MG tablet Take 1 tablet (10 mg total) by mouth at bedtime. Qty: 30 tablet, Refills: 0      CONTINUE these medications which have NOT CHANGED   Details  amLODipine (NORVASC) 10 MG tablet Take 10 mg by mouth daily.     aspirin EC 81 MG tablet Take 81 mg by mouth daily.     esomeprazole (NEXIUM) 40 MG capsule Take 1 capsule (40 mg total) by mouth daily. Qty: 90 capsule, Refills: 3    fish oil-omega-3 fatty acids 1000 MG capsule Take 2 g by mouth daily.      glipiZIDE (GLUCOTROL XL) 10 MG 24 hr tablet 1 tab per day for blood sugar more than 200 Qty: 30 tablet, Refills: 11    Glucosamine 500 MG TABS Take 1 tablet by mouth daily.      glucose blood (ONE TOUCH ULTRA TEST) test strip PRN Qty: 100 each, Refills: 5   Associated Diagnoses: Type II or unspecified type diabetes mellitus without mention of complication, not stated as uncontrolled    insulin glargine (LANTUS) 100 UNIT/ML injection Inject 45 Units into the skin at bedtime. Qty: 10 mL    insulin lispro (HUMALOG) 100 UNIT/ML injection Inject 10 Units into the skin 3  (three) times daily before meals.      Lancets (ONETOUCH ULTRASOFT) lancets PRN Qty: 100 each, Refills: 5   Associated Diagnoses: Type II or unspecified type diabetes mellitus without mention of complication, not stated as uncontrolled  lisinopril (PRINIVIL,ZESTRIL) 20 MG tablet Take 1 tablet (20 mg total) by mouth daily. Qty: 90 tablet, Refills: 3    loratadine (CLARITIN) 10 MG tablet Take 1 tablet (10 mg total) by mouth daily.    metFORMIN (GLUCOPHAGE) 500 MG tablet Take 2 tablets (1,000 mg total) by mouth 2 (two) times daily with a meal. Qty: 120 tablet, Refills: 11   Associated Diagnoses: Type II or unspecified type diabetes mellitus without mention of complication, not stated as uncontrolled    Multiple Vitamin (MULTIVITAMIN) capsule Take 1 capsule by mouth daily.      naproxen (EC NAPROSYN) 500 MG EC tablet Take 1 tablet (500 mg total) by mouth 2 (two) times daily with a meal. Qty: 60 tablet, Refills: 5   Associated Diagnoses: Neck pain    sodium chloride (OCEAN) 0.65 % SOLN nasal spray Place 1 spray into the nose as needed for congestion.       Follow-up Information    Follow up with [facility] on 08/03/2011. ([facility] will see you at home Tuesday, August 03, 2011 @ 3:00 p.m.)    Contact information:    [address] [place], [place]   [phone]        Signed: Orson Aloe 08/03/2011, 11:55 AM

## 2011-08-03 NOTE — Progress Notes (Signed)
[facility] Group Notes: (Counselor/Nursing/MHT/Case Management/Adjunct) 08/03/2011   @  11:00am   Type of Therapy:  Group Therapy  Participation Level:  Active  Participation Quality:  Attentive, Appropriate, Sharing  Affect:  Appropriate  Cognitive:  Appropriate  Insight:  Good  Engagement in Group: Good  Engagement in Therapy:  Good  Modes of Intervention:  Support and Exploration  Summary of Progress/Problems: Fannie explored his concept of recovery, stating that he plans to set boundaries with others, be assertive about his needs, stop neglecting himself, and get back into school. He also considered the concept of becoming a peer support specialist and was able to acknowledge how he can use his knowledge and experience to help others around him. Jasai examined the Recovery Model and how it works in his life differently than the medical model, stating that it allows him to go further than just existing and functioning. He was supportive of other defining their own idea of recovery.   Billie Lade 07/29/2011  3:56 PM

## 2011-08-05 NOTE — Progress Notes (Signed)
Patient Discharge Instructions:  Admission Note Faxed,  08/04/2011 Discharge Note Faxed,   08/04/2011 After Visit Summary Faxed,  08/04/2011 Faxed to the Next Level Care provider:  08/04/2011 facesheet faxed 08/04/2011   Faxed to [facility] @ [phone]  Wandra Scot, 08/05/2011, 11:04 AM

## 2011-08-10 ENCOUNTER — Ambulatory Visit: Payer: PRIVATE HEALTH INSURANCE | Admitting: Pulmonary Disease

## 2011-08-11 NOTE — Letter (Signed)
August 10, 2011   Ary Hebb [address] [place], [place] [address]  RE:  Wayne [name], Wayne [name] MRN:  [id number]  /  DOB:  1976/02/18  Dear Mr. Kibble:  I am writing to inform you that I can no longer provide care for management of your obstructive sleep apnea.  You have either canceled or failed to show for the last 4 appointments, since September of 2012.  We are obviously not communicating on the importance of followup and management of your underlying sleep apnea.  I am more than happy to provide care for you in the event of emergencies related to your sleep apnea over the next 30 days, but would strongly encourage you to find another sleep physician outside of this practice.  I will be happy to transfer my current records to your new physician of choice.   Sincerely,     Barbaraann Share, MD,FCCP Electronically Signed   [facility]  DD: 08/10/2011  DT: 08/10/2011  Job #: [id number]

## 2011-08-17 ENCOUNTER — Telehealth: Payer: Self-pay | Admitting: Pulmonary Disease

## 2011-08-17 NOTE — Telephone Encounter (Signed)
Dismissal Letter sent by Certified Mail 08/17/2011  Dismissal Letter returned Unclaimed 09/15/2011  Dismissal Letter sent by 1st Class Mail  to the [address] [place] address on 09/15/2011

## 2011-08-23 ENCOUNTER — Ambulatory Visit: Payer: PRIVATE HEALTH INSURANCE | Admitting: Internal Medicine

## 2011-09-03 ENCOUNTER — Ambulatory Visit (INDEPENDENT_AMBULATORY_CARE_PROVIDER_SITE_OTHER): Payer: PRIVATE HEALTH INSURANCE | Admitting: Internal Medicine

## 2011-09-03 DIAGNOSIS — E119 Type 2 diabetes mellitus without complications: Secondary | ICD-10-CM

## 2011-09-03 MED ORDER — INSULIN LISPRO 100 UNIT/ML ~~LOC~~ SOLN: 10.0000 [IU] | Freq: Three times a day (TID) | SUBCUTANEOUS | Status: DC

## 2011-09-03 MED ORDER — INSULIN GLARGINE 100 UNIT/ML ~~LOC~~ SOLN: 45.0000 [IU] | Freq: Every day | SUBCUTANEOUS | Status: DC

## 2011-09-03 NOTE — Patient Instructions (Signed)
You need to get a doctor within 30 days Do not run out of you medicines

## 2011-09-03 NOTE — Progress Notes (Signed)
  Subjective:    Patient ID: Wayne [name], male    DOB: 1976/06/02, 36 y.o.   MRN: [id number]  HPI Here to get establish. No complaints except that he run out of Lantus and Humalog a week ago.  Past Medical History  Diagnosis Date  . Depression   . Diabetes mellitus   . Hyperlipidemia   . HTN (hypertension)   . Chronic headaches   . Osteoarthritis   . Kidney stones   . Polysubstance abuse     cocaine, marijuana-quit summer 2010  . Tobacco abuse   . Asthma   . Chest pain   . Lumbar disc disease   . GERD (gastroesophageal reflux disease)   . OSA (obstructive sleep apnea)   . Peripheral neuropathy 02/01/2011     Review of Systems     Objective:   Physical Exam  Alert, oriented x3, in no apparent distress. Cooperative and coherent.      Assessment & Plan:  Patient presents as a new patient, as I started to review the chart, I realize that he was a [facility] patient up until  a few months ago when he was discharged by one of my colleagues in primary care. He was also discharged from pulmonology. I explained to the  patient our policy that we can not see patient that has been discharged from our group. I apologize for the confusion. I think the best thing to do for him is to refilled his Lantus-Humalog, I also told him that I will help him for the next 30 days but after that I won't be able to do that anymore. Patient seem to understand. I also encouraged him to take his medications as prescribed.

## 2011-09-05 ENCOUNTER — Encounter: Payer: Self-pay | Admitting: Internal Medicine

## 2011-10-02 ENCOUNTER — Emergency Department (HOSPITAL_BASED_OUTPATIENT_CLINIC_OR_DEPARTMENT_OTHER)
Admission: EM | Admit: 2011-10-02 | Discharge: 2011-10-02 | Disposition: A | Discharge status: Home / Self Care | Payer: Self-pay | Attending: Emergency Medicine | Admitting: Emergency Medicine

## 2011-10-02 ENCOUNTER — Encounter (HOSPITAL_BASED_OUTPATIENT_CLINIC_OR_DEPARTMENT_OTHER): Payer: Self-pay | Admitting: Emergency Medicine

## 2011-10-02 ENCOUNTER — Emergency Department (INDEPENDENT_AMBULATORY_CARE_PROVIDER_SITE_OTHER): Payer: Self-pay

## 2011-10-02 DIAGNOSIS — R739 Hyperglycemia, unspecified: Secondary | ICD-10-CM

## 2011-10-02 DIAGNOSIS — E785 Hyperlipidemia, unspecified: Secondary | ICD-10-CM | POA: Insufficient documentation

## 2011-10-02 DIAGNOSIS — K219 Gastro-esophageal reflux disease without esophagitis: Secondary | ICD-10-CM | POA: Insufficient documentation

## 2011-10-02 DIAGNOSIS — F3289 Other specified depressive episodes: Secondary | ICD-10-CM | POA: Insufficient documentation

## 2011-10-02 DIAGNOSIS — F329 Major depressive disorder, single episode, unspecified: Secondary | ICD-10-CM | POA: Insufficient documentation

## 2011-10-02 DIAGNOSIS — I10 Essential (primary) hypertension: Secondary | ICD-10-CM | POA: Insufficient documentation

## 2011-10-02 DIAGNOSIS — R5383 Other fatigue: Secondary | ICD-10-CM

## 2011-10-02 DIAGNOSIS — E1169 Type 2 diabetes mellitus with other specified complication: Secondary | ICD-10-CM | POA: Insufficient documentation

## 2011-10-02 DIAGNOSIS — G4733 Obstructive sleep apnea (adult) (pediatric): Secondary | ICD-10-CM | POA: Insufficient documentation

## 2011-10-02 DIAGNOSIS — Z79899 Other long term (current) drug therapy: Secondary | ICD-10-CM | POA: Insufficient documentation

## 2011-10-02 DIAGNOSIS — R0602 Shortness of breath: Secondary | ICD-10-CM

## 2011-10-02 DIAGNOSIS — J45909 Unspecified asthma, uncomplicated: Secondary | ICD-10-CM | POA: Insufficient documentation

## 2011-10-02 LAB — URINALYSIS, ROUTINE W REFLEX MICROSCOPIC
Ketones, ur: >80 mg/dL — AB
Leukocytes, UA: NEGATIVE
Nitrite: NEGATIVE
Protein, ur: NEGATIVE mg/dL

## 2011-10-02 LAB — COMPREHENSIVE METABOLIC PANEL
Alkaline Phosphatase: 91 U/L (ref 39–117)
BUN: 13 mg/dL (ref 6–23)
Chloride: 100 mEq/L (ref 96–112)
GFR calc Af Amer: >90 mL/min (ref >90)
GFR calc non Af Amer: >90 mL/min (ref >90)
Glucose, Bld: 283 mg/dL — ABNORMAL HIGH (ref 70–99)
Potassium: 3.8 mEq/L (ref 3.5–5.1)
Total Bilirubin: 0.4 mg/dL (ref 0.3–1.2)

## 2011-10-02 LAB — DIFFERENTIAL
Eosinophils Absolute: 0 10*3/uL (ref 0.0–0.7)
Lymphs Abs: 1.6 10*3/uL (ref 0.7–4.0)
Monocytes Relative: 12 % (ref 3–12)
Neutro Abs: 3.7 10*3/uL (ref 1.7–7.7)
Neutrophils Relative %: 61 % (ref 43–77)

## 2011-10-02 LAB — URINE MICROSCOPIC-ADD ON

## 2011-10-02 LAB — CBC
HCT: 36.3 % — ABNORMAL LOW (ref 39.0–52.0)
Hemoglobin: 12.8 g/dL — ABNORMAL LOW (ref 13.0–17.0)
MCH: 31.5 pg (ref 26.0–34.0)
RBC: 4.06 MIL/uL — ABNORMAL LOW (ref 4.22–5.81)

## 2011-10-02 LAB — POCT I-STAT 3, VENOUS BLOOD GAS (G3P V)
O2 Saturation: 86 %
TCO2: 24 mmol/L (ref 0–100)
pCO2, Ven: 43.6 mmHg — ABNORMAL LOW (ref 45.0–50.0)
pO2, Ven: 56 mmHg — ABNORMAL HIGH (ref 30.0–45.0)

## 2011-10-02 LAB — GLUCOSE, CAPILLARY

## 2011-10-02 MED ORDER — INSULIN REGULAR HUMAN 100 UNIT/ML IJ SOLN
5.0000 [IU] | Freq: Once | INTRAMUSCULAR | Status: AC
  Administered 2011-10-02: 5 [IU] via SUBCUTANEOUS
  Filled 2011-10-02: qty 3

## 2011-10-02 MED ORDER — SODIUM CHLORIDE 0.9 % IV BOLUS (SEPSIS)
1000.0000 mL | Freq: Once | INTRAVENOUS | Status: AC
  Administered 2011-10-02: 1000 mL via INTRAVENOUS

## 2011-10-02 MED ORDER — IBUPROFEN 800 MG PO TABS
800.0000 mg | ORAL_TABLET | Freq: Once | ORAL | Status: AC
  Administered 2011-10-02: 800 mg via ORAL
  Filled 2011-10-02: qty 1

## 2011-10-02 MED ORDER — LISINOPRIL 10 MG PO TABS: 20.0000 mg | ORAL_TABLET | Freq: Every day | ORAL | Status: DC

## 2011-10-02 MED ORDER — INSULIN GLARGINE 100 UNIT/ML ~~LOC~~ SOLN: 45.0000 [IU] | Freq: Every day | SUBCUTANEOUS | Status: DC

## 2011-10-02 MED ORDER — INSULIN LISPRO 100 UNIT/ML ~~LOC~~ SOLN: 10.0000 [IU] | Freq: Three times a day (TID) | SUBCUTANEOUS | Status: DC

## 2011-10-02 NOTE — Discharge Instructions (Signed)
Hyperglycemia  Hyperglycemia occurs when the glucose (sugar) in your blood is too high. Hyperglycemia can happen for many reasons, but it most often happens to people who do not know they have diabetes or are not managing their diabetes properly.   CAUSES   Whether you have diabetes or not, there are other causes of hyperglycemia. Hyperglycemia can occur when you have diabetes, but it can also occur in other situations that you might not be as aware of, such as:  Diabetes  · If you have diabetes and are having problems controlling your blood glucose, hyperglycemia could occur because of some of the following reasons:  · Not following your meal plan.  · Not taking your diabetes medications or not taking it properly.  · Exercising less or doing less activity than you normally do.  · Being sick.  Pre-diabetes  · This cannot be ignored. Before people develop Type 2 diabetes, they almost always have "pre-diabetes." This is when your blood glucose levels are higher than normal, but not yet high enough to be diagnosed as diabetes. Research has shown that some long-term damage to the body, especially the heart and circulatory system, may already be occurring during pre-diabetes. If you take action to manage your blood glucose when you have pre-diabetes, you may delay or prevent Type 2 diabetes from developing.  Stress  · If you have diabetes, you may be "diet" controlled or on oral medications or insulin to control your diabetes. However, you may find that your blood glucose is higher than usual in the hospital whether you have diabetes or not. This is often referred to as "stress hyperglycemia." Stress can elevate your blood glucose. This happens because of hormones put out by the body during times of stress. If stress has been the cause of your high blood glucose, it can be followed regularly by your caregiver. That way he/she can make sure your hyperglycemia does not continue to get worse or progress to  diabetes.  Steroids  · Steroids are medications that act on the infection fighting system (immune system) to block inflammation or infection. One side effect can be a rise in blood glucose. Most people can produce enough extra insulin to allow for this rise, but for those who cannot, steroids make blood glucose levels go even higher. It is not unusual for steroid treatments to "uncover" diabetes that is developing. It is not always possible to determine if the hyperglycemia will go away after the steroids are stopped. A special blood test called an A1c is sometimes done to determine if your blood glucose was elevated before the steroids were started.  SYMPTOMS  · Thirsty.  · Frequent urination.  · Dry mouth.  · Blurred vision.  · Tired or fatigue.  · Weakness.  · Sleepy.  · Tingling in feet or leg.  DIAGNOSIS   Diagnosis is made by monitoring blood glucose in one or all of the following ways:  · A1c test. This is a chemical found in your blood.  · Fingerstick blood glucose monitoring.  · Laboratory results.  TREATMENT   First, knowing the cause of the hyperglycemia is important before the hyperglycemia can be treated. Treatment may include, but is not be limited to:  · Education.  · Change or adjustment in medications.  · Change or adjustment in meal plan.  · Treatment for an illness, infection, etc.  · More frequent blood glucose monitoring.  · Change in exercise plan.  · Decreasing or stopping steroids.  ·   Lifestyle changes.  HOME CARE INSTRUCTIONS   · Test your blood glucose as directed.  · Exercise regularly. Your caregiver will give you instructions about exercise. Pre-diabetes or diabetes which comes on with stress is helped by exercising.  · Eat wholesome, balanced meals. Eat often and at regular, fixed times. Your caregiver or nutritionist will give you a meal plan to guide your sugar intake.  · Being at an ideal weight is important. If needed, losing as little as 10 to 15 pounds may help improve blood  glucose levels.  SEEK MEDICAL CARE IF:   · You have questions about medicine, activity, or diet.  · You continue to have symptoms (problems such as increased thirst, urination, or weight gain).  SEEK IMMEDIATE MEDICAL CARE IF:   · You are vomiting or have diarrhea.  · Your breath smells fruity.  · You are breathing faster or slower.  · You are very sleepy or incoherent.  · You have numbness, tingling, or pain in your feet or hands.  · You have chest pain.  · Your symptoms get worse even though you have been following your caregiver's orders.  · If you have any other questions or concerns.  Document Released: 01/05/2001 Document Revised: 07/01/2011 Document Reviewed: 03/03/2009  [employer]® Patient Information ©2012 [employer].

## 2011-10-02 NOTE — ED Notes (Signed)
The patient's CBG was 237.

## 2011-10-02 NOTE — ED Notes (Signed)
Pt states he wants all of his medications refilled due to his MD had d/c'd him due to missing numerous appts.  Related info to Dr.Hosmer.
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
## 2011-10-03 ENCOUNTER — Emergency Department (HOSPITAL_COMMUNITY)
Admission: EM | Admit: 2011-10-03 | Discharge: 2011-10-04 | Disposition: A | Discharge status: Home / Self Care | Payer: PRIVATE HEALTH INSURANCE | Attending: Emergency Medicine | Admitting: Emergency Medicine

## 2011-10-03 ENCOUNTER — Encounter (HOSPITAL_COMMUNITY): Payer: Self-pay | Admitting: *Deleted

## 2011-10-03 DIAGNOSIS — F329 Major depressive disorder, single episode, unspecified: Secondary | ICD-10-CM

## 2011-10-03 DIAGNOSIS — J069 Acute upper respiratory infection, unspecified: Secondary | ICD-10-CM | POA: Insufficient documentation

## 2011-10-03 DIAGNOSIS — F3289 Other specified depressive episodes: Secondary | ICD-10-CM | POA: Insufficient documentation

## 2011-10-03 DIAGNOSIS — F1410 Cocaine abuse, uncomplicated: Secondary | ICD-10-CM | POA: Insufficient documentation

## 2011-10-03 DIAGNOSIS — E119 Type 2 diabetes mellitus without complications: Secondary | ICD-10-CM | POA: Insufficient documentation

## 2011-10-03 DIAGNOSIS — Z794 Long term (current) use of insulin: Secondary | ICD-10-CM | POA: Insufficient documentation

## 2011-10-03 DIAGNOSIS — R0602 Shortness of breath: Secondary | ICD-10-CM | POA: Insufficient documentation

## 2011-10-03 DIAGNOSIS — I10 Essential (primary) hypertension: Secondary | ICD-10-CM | POA: Insufficient documentation

## 2011-10-03 HISTORY — DX: Bipolar disorder, unspecified: F31.9

## 2011-10-03 LAB — COMPREHENSIVE METABOLIC PANEL WITH GFR
ALT: 19 U/L (ref 0–53)
AST: 28 U/L (ref 0–37)
Albumin: 3.7 g/dL (ref 3.5–5.2)
Alkaline Phosphatase: 82 U/L (ref 39–117)
BUN: 8 mg/dL (ref 6–23)
CO2: 25 meq/L (ref 19–32)
Calcium: 9.4 mg/dL (ref 8.4–10.5)
Chloride: 102 meq/L (ref 96–112)
Creatinine, Ser: 0.81 mg/dL (ref 0.50–1.35)
GFR calc Af Amer: >90 mL/min
GFR calc non Af Amer: >90 mL/min
Glucose, Bld: 253 mg/dL — ABNORMAL HIGH (ref 70–99)
Potassium: 4 meq/L (ref 3.5–5.1)
Sodium: 138 meq/L (ref 135–145)
Total Bilirubin: 0.5 mg/dL (ref 0.3–1.2)
Total Protein: 7.7 g/dL (ref 6.0–8.3)

## 2011-10-03 LAB — RAPID URINE DRUG SCREEN, HOSP PERFORMED: Barbiturates: NOT DETECTED

## 2011-10-03 LAB — GLUCOSE, CAPILLARY: Glucose-Capillary: 339 mg/dL — ABNORMAL HIGH (ref 70–99)

## 2011-10-03 LAB — DIFFERENTIAL
Basophils Absolute: 0 10*3/uL (ref 0.0–0.1)
Eosinophils Relative: 1 % (ref 0–5)
Lymphocytes Relative: 29 % (ref 12–46)
Lymphs Abs: 1.6 10*3/uL (ref 0.7–4.0)
Neutro Abs: 3.5 10*3/uL (ref 1.7–7.7)

## 2011-10-03 LAB — CBC
MCV: 90.5 fL (ref 78.0–100.0)
Platelets: 188 10*3/uL (ref 150–400)
RBC: 4.09 MIL/uL — ABNORMAL LOW (ref 4.22–5.81)
RDW: 13.2 % (ref 11.5–15.5)
WBC: 5.8 10*3/uL (ref 4.0–10.5)

## 2011-10-03 LAB — ETHANOL: Alcohol, Ethyl (B): <11 mg/dL (ref 0–11)

## 2011-10-03 LAB — ACETAMINOPHEN LEVEL: Acetaminophen (Tylenol), Serum: <15 ug/mL (ref 10–30)

## 2011-10-03 MED ORDER — IBUPROFEN 200 MG PO TABS: 600.0000 mg | ORAL_TABLET | Freq: Three times a day (TID) | ORAL | Status: DC | PRN

## 2011-10-03 MED ORDER — INSULIN LISPRO 100 UNIT/ML ~~LOC~~ SOLN: 10.0000 [IU] | Freq: Three times a day (TID) | SUBCUTANEOUS | Status: DC

## 2011-10-03 MED ORDER — BUDESONIDE-FORMOTEROL FUMARATE 80-4.5 MCG/ACT IN AERO
2.0000 | INHALATION_SPRAY | Freq: Two times a day (BID) | RESPIRATORY_TRACT | Status: DC
  Administered 2011-10-03 – 2011-10-04 (×2): 2 via RESPIRATORY_TRACT
  Filled 2011-10-03: qty 6.9

## 2011-10-03 MED ORDER — METFORMIN HCL 500 MG PO TABS
1000.0000 mg | ORAL_TABLET | Freq: Two times a day (BID) | ORAL | Status: DC
  Administered 2011-10-03 – 2011-10-04 (×2): 1000 mg via ORAL
  Filled 2011-10-03 (×3): qty 2

## 2011-10-03 MED ORDER — GLIPIZIDE ER 2.5 MG PO TB24
2.5000 mg | ORAL_TABLET | Freq: Every day | ORAL | Status: DC
  Administered 2011-10-04: 2.5 mg via ORAL
  Filled 2011-10-03 (×2): qty 1

## 2011-10-03 MED ORDER — GABAPENTIN 300 MG PO CAPS
300.0000 mg | ORAL_CAPSULE | Freq: Three times a day (TID) | ORAL | Status: DC
  Administered 2011-10-03 – 2011-10-04 (×3): 300 mg via ORAL
  Filled 2011-10-03 (×5): qty 1

## 2011-10-03 MED ORDER — LISINOPRIL 20 MG PO TABS
20.0000 mg | ORAL_TABLET | Freq: Every day | ORAL | Status: DC
  Administered 2011-10-03 – 2011-10-04 (×2): 20 mg via ORAL
  Filled 2011-10-03 (×2): qty 1

## 2011-10-03 MED ORDER — PANTOPRAZOLE SODIUM 40 MG PO TBEC
40.0000 mg | DELAYED_RELEASE_TABLET | Freq: Every day | ORAL | Status: DC
  Administered 2011-10-03 – 2011-10-04 (×2): 40 mg via ORAL
  Filled 2011-10-03 (×2): qty 1

## 2011-10-03 MED ORDER — SERTRALINE HCL 50 MG PO TABS
200.0000 mg | ORAL_TABLET | Freq: Every day | ORAL | Status: DC
  Administered 2011-10-03 – 2011-10-04 (×2): 200 mg via ORAL
  Filled 2011-10-03: qty 1
  Filled 2011-10-03: qty 4
  Filled 2011-10-03: qty 3
  Filled 2011-10-03: qty 4
  Filled 2011-10-03: qty 1

## 2011-10-03 MED ORDER — INSULIN ASPART 100 UNIT/ML ~~LOC~~ SOLN
10.0000 [IU] | Freq: Three times a day (TID) | SUBCUTANEOUS | Status: DC
  Administered 2011-10-03 – 2011-10-04 (×3): 10 [IU] via SUBCUTANEOUS
  Filled 2011-10-03 (×2): qty 1
  Filled 2011-10-03: qty 3

## 2011-10-03 MED ORDER — ACETAMINOPHEN 325 MG PO TABS
650.0000 mg | ORAL_TABLET | ORAL | Status: DC | PRN
  Administered 2011-10-03: 650 mg via ORAL
  Filled 2011-10-03: qty 2

## 2011-10-03 MED ORDER — INSULIN GLARGINE 100 UNIT/ML ~~LOC~~ SOLN
45.0000 [IU] | Freq: Every day | SUBCUTANEOUS | Status: DC
  Administered 2011-10-03: 45 [IU] via SUBCUTANEOUS
  Filled 2011-10-03: qty 1

## 2011-10-03 MED ORDER — ALUM & MAG HYDROXIDE-SIMETH 200-200-20 MG/5ML PO SUSP
30.0000 mL | ORAL | Status: DC | PRN
  Administered 2011-10-04: 30 mL via ORAL
  Filled 2011-10-03: qty 30

## 2011-10-03 MED ORDER — BUPROPION HCL ER (SR) 100 MG PO TB12
100.0000 mg | ORAL_TABLET | Freq: Every day | ORAL | Status: DC
  Administered 2011-10-03 – 2011-10-04 (×2): 100 mg via ORAL
  Filled 2011-10-03 (×2): qty 1

## 2011-10-03 MED ORDER — ONDANSETRON HCL 4 MG PO TABS: 4.0000 mg | ORAL_TABLET | Freq: Three times a day (TID) | ORAL | Status: DC | PRN

## 2011-10-03 MED ORDER — ALBUTEROL SULFATE HFA 108 (90 BASE) MCG/ACT IN AERS: 2.0000 | INHALATION_SPRAY | Freq: Four times a day (QID) | RESPIRATORY_TRACT | Status: DC | PRN

## 2011-10-03 NOTE — ED Notes (Signed)
ACT team in to see patient. 

## 2011-10-03 NOTE — ED Notes (Signed)
Pt changing into blue scrubs. ?
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
## 2011-10-03 NOTE — ED Notes (Signed)
Pt. Soiled his pants, given new scrub pants.

## 2011-10-03 NOTE — ED Notes (Addendum)
Per EMS, pt called 911 due to depression, SI, leg pain, hyperglycemia and dysuria. Pt reports being homeless and using crack.
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
## 2011-10-04 LAB — GLUCOSE, CAPILLARY: Glucose-Capillary: 171 mg/dL — ABNORMAL HIGH (ref 70–99)

## 2011-10-04 MED ORDER — BUPROPION HCL 100 MG PO TABS: 100.0000 mg | ORAL_TABLET | Freq: Every day | ORAL | Status: DC

## 2011-10-04 MED ORDER — GABAPENTIN 300 MG PO CAPS: 300.0000 mg | ORAL_CAPSULE | Freq: Three times a day (TID) | ORAL | Status: DC

## 2011-10-04 MED ORDER — LOPERAMIDE HCL 2 MG PO CAPS
2.0000 mg | ORAL_CAPSULE | Freq: Once | ORAL | Status: AC
  Administered 2011-10-04: 2 mg via ORAL
  Filled 2011-10-04: qty 1

## 2011-10-04 MED ORDER — SERTRALINE HCL 100 MG PO TABS: 200.0000 mg | ORAL_TABLET | Freq: Every day | ORAL | Status: DC

## 2011-10-04 NOTE — ED Notes (Signed)
Telepsych with tech at bedside

## 2011-10-04 NOTE — [facility] Counselor (Signed)
Received call from [facility] stating that patient is under review for a in-pt admission. Meanwhile, patient is also pending a telepsych consult.

## 2011-10-04 NOTE — ED Notes (Signed)
Pt to be discharged, MD to bedside explaining discharge to patient, belongings returned to patient at this time, awaiting paper work

## 2011-10-04 NOTE — ED Notes (Signed)
Report received from previous nurse.  First contact with patient. Pt moved to TCU room 26.  Pt denies needs at this time.  Will continue to monitor. Sitter at bedside. Pt resting comfortably at this time.

## 2011-10-04 NOTE — ED Provider Notes (Signed)
Psychiatric consult complete. Patient has no evidence of delusional thought process her cognitive impairment. He denies any suicidal or homicidal ideation. He is an element of malingering. He is stable for discharge. BP 131/71  Pulse 68  Temp(Src) 98.2 F (36.8 C) (Oral)  Resp 16  Ht 6\' 1"  (1.854 m)  Wt 200 lb (90.719 kg)  BMI 26.39 kg/m2  SpO2 99%   Glynn Octave, MD 10/04/11 1208

## 2011-10-04 NOTE — [facility] Counselor (Signed)
Per telepsych, pt to be discharged home. Writer contacted Tameka at [facility] and made her aware that patient no longer needed a in-pt admission at their facility.

## 2011-10-04 NOTE — ED Notes (Signed)
Pt having loose stools.  meds given.

## 2011-11-26 ENCOUNTER — Emergency Department (INDEPENDENT_AMBULATORY_CARE_PROVIDER_SITE_OTHER)
Admission: EM | Admit: 2011-11-26 | Discharge: 2011-11-26 | Disposition: A | Discharge status: Home / Self Care | Payer: Self-pay | Source: Home / Self Care | Attending: Family Medicine | Admitting: Family Medicine

## 2011-11-26 ENCOUNTER — Encounter (HOSPITAL_COMMUNITY): Payer: Self-pay | Admitting: *Deleted

## 2011-11-26 DIAGNOSIS — A084 Viral intestinal infection, unspecified: Secondary | ICD-10-CM

## 2011-11-26 DIAGNOSIS — A09 Infectious gastroenteritis and colitis, unspecified: Secondary | ICD-10-CM

## 2011-11-26 DIAGNOSIS — A088 Other specified intestinal infections: Secondary | ICD-10-CM

## 2011-11-26 LAB — POCT URINALYSIS DIP (DEVICE)
Ketones, ur: NEGATIVE mg/dL
Protein, ur: NEGATIVE mg/dL
Specific Gravity, Urine: >=1.03 (ref 1.005–1.030)
pH: 5.5 (ref 5.0–8.0)

## 2011-11-26 MED ORDER — ONDANSETRON HCL 4 MG PO TABS: 4.0000 mg | ORAL_TABLET | Freq: Four times a day (QID) | ORAL | Status: AC

## 2011-11-26 MED ORDER — ONDANSETRON 4 MG PO TBDP
ORAL_TABLET | ORAL | Status: AC
  Filled 2011-11-26: qty 1

## 2011-11-26 MED ORDER — ONDANSETRON 4 MG PO TBDP: 4.0000 mg | ORAL_TABLET | Freq: Once | ORAL | Status: DC

## 2011-11-26 MED ORDER — LOPERAMIDE HCL 2 MG PO CAPS: 2.0000 mg | ORAL_CAPSULE | Freq: Four times a day (QID) | ORAL | Status: AC | PRN

## 2011-11-26 NOTE — ED Provider Notes (Signed)
History     CSN: [id number]  Arrival date & time 11/26/11  8657   First MD Initiated Contact with Patient 11/26/11 0831      Chief Complaint  Patient presents with  . Flank Pain    (Consider location/radiation/quality/duration/timing/severity/associated sxs/prior treatment) Patient is a 36 y.o. male presenting with flank pain. The history is provided by the patient.  Flank Pain This is a new problem. The current episode started 6 to 12 hours ago. The problem has been gradually improving. Associated symptoms include abdominal pain. Pertinent negatives include no chest pain. Associated symptoms comments: Vomited x3 with diarrhea, no blood seen, took imodium, sore bilat flank., no fever..    Past Medical History  Diagnosis Date  . Depression   . Diabetes mellitus   . Hyperlipidemia   . HTN (hypertension)   . Chronic headaches   . Osteoarthritis   . Kidney stones   . Polysubstance abuse     cocaine, marijuana-quit summer 2010  . Tobacco abuse   . Asthma   . Chest pain   . Lumbar disc disease   . GERD (gastroesophageal reflux disease)   . OSA (obstructive sleep apnea)   . Peripheral neuropathy 02/01/2011  . Bipolar 1 disorder     Past Surgical History  Procedure Date  . Knee surgery   . Bunionectomy 06-2009    Family History  Problem Relation Age of Onset  . Heart disease Mother   . Diabetes Father   . Hyperlipidemia Father   . Emphysema Maternal Uncle   . Asthma Sister   . Asthma Mother   . Stroke Mother   . Hypertension Father   . Alzheimer's disease Maternal Grandmother   . Depression Father   . Breast cancer Maternal Aunt   . COPD Mother     maternal un    History  Substance Use Topics  . Smoking status: Current Everyday Smoker -- 0.2 packs/day for 17 years    Types: Cigarettes    Last Attempt to Quit: 02/24/2011  . Smokeless tobacco: Former Neurosurgeon  . Alcohol Use: Yes     socially      Review of Systems  Constitutional: Negative.     Cardiovascular: Negative for chest pain.  Gastrointestinal: Positive for vomiting, abdominal pain and diarrhea. Negative for nausea and blood in stool.  Genitourinary: Positive for flank pain. Negative for urgency and discharge.    Allergies  Iodine; Lipitor; Nystatin; and Statins  Home Medications   Current Outpatient Rx  Name Route Sig Dispense Refill  . TRAZODONE HCL 100 MG PO TABS Oral Take 100 mg by mouth at bedtime.    . ALBUTEROL SULFATE HFA 108 (90 BASE) MCG/ACT IN AERS Inhalation Inhale 2 puffs into the lungs every 6 (six) hours as needed. For shortness of breath    . ALUM & MAG HYDROXIDE-SIMETH 200-200-20 MG/5ML PO SUSP Oral Take 15 mLs by mouth every 6 (six) hours as needed. For diarrhea    . ASPIRIN EC 81 MG PO TBEC Oral Take 81 mg by mouth daily.     . BUDESONIDE-FORMOTEROL FUMARATE 80-4.5 MCG/ACT IN AERO Inhalation Inhale 2 puffs into the lungs 2 (two) times daily.    . BUPROPION HCL ER (SR) 100 MG PO TB12 Oral Take 1 tablet (100 mg total) by mouth daily. 30 tablet 0  . BUPROPION HCL 100 MG PO TABS Oral Take 1 tablet (100 mg total) by mouth daily. 30 tablet 0  . CYCLOBENZAPRINE HCL 5 MG PO TABS Oral  Take 5 mg by mouth 2 (two) times daily as needed. For muscle spasm    . ESOMEPRAZOLE MAGNESIUM 40 MG PO CPDR Oral Take 1 capsule (40 mg total) by mouth daily. 90 capsule 3  . OMEGA-3 FATTY ACIDS 1000 MG PO CAPS Oral Take 2 g by mouth daily.      [name] GABAPENTIN 300 MG PO CAPS Oral Take 1 capsule (300 mg total) by mouth 3 (three) times daily. 150 capsule 0    Also take 2 capsules at bedtime.  [name] GABAPENTIN 300 MG PO CAPS Oral Take 1 capsule (300 mg total) by mouth 3 (three) times daily. 90 capsule 0  . GLIPIZIDE ER 10 MG PO TB24  1 tab per day for blood sugar more than 200 30 tablet 11  . GLUCOSAMINE 500 MG PO TABS Oral Take 1 tablet by mouth daily.     [name] GLUCOSE BLOOD VI STRP  PRN 100 each 5  . IBUPROFEN 800 MG PO TABS Oral Take 800 mg by mouth every 8 (eight) hours as needed. For  pain    . INSULIN GLARGINE 100 UNIT/ML [place] SOLN Subcutaneous Inject 45 Units into the skin at bedtime. 10 mL 12  . INSULIN LISPRO (HUMAN) 100 UNIT/ML [place] SOLN Subcutaneous Inject 10 Units into the skin 3 (three) times daily before meals. 10 mL 12  . ONETOUCH ULTRASOFT LANCETS MISC  PRN 100 each 5  . LISINOPRIL 20 MG PO TABS Oral Take 20 mg by mouth daily.    [name] LOPERAMIDE HCL 2 MG PO CAPS Oral Take 1 capsule (2 mg total) by mouth 4 (four) times daily as needed for diarrhea or loose stools. 12 capsule 0  . LORATADINE 10 MG PO TABS Oral Take 1 tablet (10 mg total) by mouth daily.    [name] METFORMIN HCL 500 MG PO TABS Oral Take 2 tablets (1,000 mg total) by mouth 2 (two) times daily with a meal. 120 tablet 11  . MULTIVITAMINS PO CAPS Oral Take 1 capsule by mouth daily.      [name] NAPROXEN 500 MG PO TBEC Oral Take 1 tablet (500 mg total) by mouth 2 (two) times daily with a meal. 60 tablet 5  . ONDANSETRON HCL 4 MG PO TABS Oral Take 1 tablet (4 mg total) by mouth every 6 (six) hours. 8 tablet 0  . SERTRALINE HCL 100 MG PO TABS Oral Take 200 mg by mouth daily.    . SERTRALINE HCL 100 MG PO TABS Oral Take 2 tablets (200 mg total) by mouth daily. 60 tablet 0  . SALINE 0.65 % NA SOLN Nasal Place 1 spray into the nose as needed for congestion.    . TETRAHYDROZOLINE HCL 0.05 % OP SOLN Both Eyes Place 2 drops into both eyes 2 (two) times daily. For dry eyes      BP 115/67  Pulse 80  Temp(Src) 97.4 F (36.3 C) (Oral)  Resp 16  SpO2 98%  Physical Exam  Nursing note and vitals reviewed. Constitutional: He is oriented to person, place, and time. He appears well-developed and well-nourished. No distress.  Neck: Normal range of motion. Neck supple.  Abdominal: Soft. He exhibits no distension and no mass. Bowel sounds are increased. There is no hepatosplenomegaly. There is generalized tenderness. There is no rigidity, no rebound, no guarding, no CVA tenderness and no tenderness at McBurney's point.  Lymphadenopathy:      He has no cervical adenopathy.  Neurological: He is alert and oriented to person, place, and  time.  Skin: Skin is warm and dry.    ED Course  Procedures (including critical care time)  Labs Reviewed  POCT URINALYSIS DIP (DEVICE) - Abnormal; Notable for the following:    Glucose, UA 250 (*)    Hgb urine dipstick LARGE (*)    All other components within normal limits   No results found.   1. Gastroenteritis and colitis, viral       MDM          Linna Hoff, MD 11/26/11 1005

## 2011-11-26 NOTE — Discharge Instructions (Signed)
Clear liquid , bland diet today as tolerated, advance on sat as improved, use medicine as needed, see your doctor for eval of blood in urine next week., drink plenty of fluids.

## 2011-11-26 NOTE — ED Notes (Signed)
Pt is here with complaints of right and left flank pain with dk urine and burning with urination X 2 months.  Pt also reports vomiting and diarrhea and states he feels like his glands in his neck are swollen.  Pt denies exposure to STD, states he has not been sexually active X 7 years.

## 2012-04-02 ENCOUNTER — Encounter (HOSPITAL_BASED_OUTPATIENT_CLINIC_OR_DEPARTMENT_OTHER): Payer: Self-pay | Admitting: Emergency Medicine

## 2012-04-02 ENCOUNTER — Emergency Department (HOSPITAL_BASED_OUTPATIENT_CLINIC_OR_DEPARTMENT_OTHER)
Admission: EM | Admit: 2012-04-02 | Discharge: 2012-04-02 | Disposition: A | Discharge status: Home / Self Care | Payer: Self-pay | Attending: Emergency Medicine | Admitting: Emergency Medicine

## 2012-04-02 DIAGNOSIS — E1169 Type 2 diabetes mellitus with other specified complication: Secondary | ICD-10-CM | POA: Insufficient documentation

## 2012-04-02 DIAGNOSIS — E785 Hyperlipidemia, unspecified: Secondary | ICD-10-CM | POA: Insufficient documentation

## 2012-04-02 DIAGNOSIS — K219 Gastro-esophageal reflux disease without esophagitis: Secondary | ICD-10-CM | POA: Insufficient documentation

## 2012-04-02 DIAGNOSIS — B3749 Other urogenital candidiasis: Secondary | ICD-10-CM | POA: Insufficient documentation

## 2012-04-02 DIAGNOSIS — Z794 Long term (current) use of insulin: Secondary | ICD-10-CM | POA: Insufficient documentation

## 2012-04-02 DIAGNOSIS — Z79899 Other long term (current) drug therapy: Secondary | ICD-10-CM | POA: Insufficient documentation

## 2012-04-02 DIAGNOSIS — R739 Hyperglycemia, unspecified: Secondary | ICD-10-CM

## 2012-04-02 DIAGNOSIS — I10 Essential (primary) hypertension: Secondary | ICD-10-CM | POA: Insufficient documentation

## 2012-04-02 DIAGNOSIS — Z7982 Long term (current) use of aspirin: Secondary | ICD-10-CM | POA: Insufficient documentation

## 2012-04-02 DIAGNOSIS — J45909 Unspecified asthma, uncomplicated: Secondary | ICD-10-CM | POA: Insufficient documentation

## 2012-04-02 DIAGNOSIS — B370 Candidal stomatitis: Secondary | ICD-10-CM

## 2012-04-02 DIAGNOSIS — F319 Bipolar disorder, unspecified: Secondary | ICD-10-CM | POA: Insufficient documentation

## 2012-04-02 DIAGNOSIS — F17200 Nicotine dependence, unspecified, uncomplicated: Secondary | ICD-10-CM | POA: Insufficient documentation

## 2012-04-02 DIAGNOSIS — B3742 Candidal balanitis: Secondary | ICD-10-CM

## 2012-04-02 LAB — URINALYSIS, ROUTINE W REFLEX MICROSCOPIC
Glucose, UA: >1000 mg/dL — AB
Hgb urine dipstick: NEGATIVE
Ketones, ur: 15 mg/dL — AB
Leukocytes, UA: NEGATIVE
pH: 5.5 (ref 5.0–8.0)

## 2012-04-02 LAB — BASIC METABOLIC PANEL
BUN: 19 mg/dL (ref 6–23)
BUN: 18 mg/dL (ref 6–23)
CO2: 25 mEq/L (ref 19–32)
Chloride: 86 mEq/L — ABNORMAL LOW (ref 96–112)
Chloride: 92 mEq/L — ABNORMAL LOW (ref 96–112)
Creatinine, Ser: 1.1 mg/dL (ref 0.50–1.35)
GFR calc Af Amer: >90 mL/min (ref >90)
Potassium: 4.3 mEq/L (ref 3.5–5.1)
Sodium: 130 mEq/L — ABNORMAL LOW (ref 135–145)

## 2012-04-02 LAB — CBC WITH DIFFERENTIAL/PLATELET
Basophils Relative: 0 % (ref 0–1)
Hemoglobin: 14.7 g/dL (ref 13.0–17.0)
Lymphs Abs: 2 10*3/uL (ref 0.7–4.0)
MCHC: 35.3 g/dL (ref 30.0–36.0)
Monocytes Relative: 9 % (ref 3–12)
Neutro Abs: 4.9 10*3/uL (ref 1.7–7.7)
Neutrophils Relative %: 63 % (ref 43–77)
RBC: 4.7 MIL/uL (ref 4.22–5.81)

## 2012-04-02 LAB — POCT I-STAT 3, VENOUS BLOOD GAS (G3P V)
O2 Saturation: 74 %
pCO2, Ven: 47.5 mmHg (ref 45.0–50.0)
pH, Ven: 7.355 — ABNORMAL HIGH (ref 7.250–7.300)
pO2, Ven: 41 mmHg (ref 30.0–45.0)

## 2012-04-02 LAB — GLUCOSE, CAPILLARY

## 2012-04-02 MED ORDER — INSULIN REGULAR HUMAN 100 UNIT/ML IJ SOLN
10.0000 [IU] | Freq: Once | INTRAMUSCULAR | Status: AC
  Administered 2012-04-02: 10 [IU] via SUBCUTANEOUS

## 2012-04-02 MED ORDER — SODIUM CHLORIDE 0.9 % IV SOLN: 1000.0000 mL | Freq: Once | INTRAVENOUS | Status: DC

## 2012-04-02 MED ORDER — LORAZEPAM 1 MG PO TABS
1.0000 mg | ORAL_TABLET | Freq: Once | ORAL | Status: AC
  Administered 2012-04-02: 1 mg via ORAL
  Filled 2012-04-02: qty 1

## 2012-04-02 MED ORDER — INSULIN REGULAR HUMAN 100 UNIT/ML IJ SOLN
INTRAMUSCULAR | Status: AC
  Filled 2012-04-02: qty 1

## 2012-04-02 MED ORDER — SODIUM CHLORIDE 0.9 % IV BOLUS (SEPSIS)
1000.0000 mL | Freq: Once | INTRAVENOUS | Status: AC
  Administered 2012-04-02: 1000 mL via INTRAVENOUS

## 2012-04-02 MED ORDER — INSULIN REGULAR HUMAN 100 UNIT/ML IJ SOLN: 10.0000 [IU] | Freq: Once | INTRAMUSCULAR | Status: DC

## 2012-04-02 MED ORDER — SODIUM CHLORIDE 0.9 % IV BOLUS (SEPSIS): 1000.0000 mL | Freq: Once | INTRAVENOUS | Status: DC

## 2012-04-02 MED ORDER — FIRST-DUKES MOUTHWASH MT SUSP: 10.0000 mL | Freq: Four times a day (QID) | OROMUCOSAL | Status: DC | PRN

## 2012-04-02 MED ORDER — SODIUM CHLORIDE 0.9 % IV SOLN: 1000.0000 mL | INTRAVENOUS | Status: DC

## 2012-04-02 MED ORDER — INSULIN REGULAR HUMAN 100 UNIT/ML IJ SOLN
15.0000 [IU] | Freq: Once | INTRAMUSCULAR | Status: AC
  Administered 2012-04-02: 15 [IU] via SUBCUTANEOUS

## 2012-04-02 MED ORDER — MICONAZOLE NITRATE 2 % EX CREA: TOPICAL_CREAM | Freq: Two times a day (BID) | CUTANEOUS | Status: DC

## 2012-04-02 MED ORDER — SODIUM CHLORIDE 0.9 % IV SOLN: INTRAVENOUS | Status: DC

## 2012-04-02 MED ORDER — FLUCONAZOLE 200 MG PO TABS: 200.0000 mg | ORAL_TABLET | Freq: Every day | ORAL | Status: AC

## 2012-04-02 NOTE — ED Notes (Signed)
The patient's CBG was 380.

## 2012-04-02 NOTE — ED Notes (Signed)
Pt states tongue started swelling this am.  Pt states his tongue feels thick.  No airway impairment.  No obvious tongue swelling.

## 2012-04-02 NOTE — ED Provider Notes (Signed)
History     CSN: [id number]  Arrival date & time 04/02/12  0941   First MD Initiated Contact with Patient 04/02/12 1413      Chief Complaint  Patient presents with  . Oral Swelling    (Consider location/radiation/quality/duration/timing/severity/associated sxs/prior treatment) The history is provided by the patient and medical records.    Wayne [name] is a 36 y.o. male presents to the emergency department complaining of visual disturbance, tongue pain.  The onset of the symptoms was  gradual starting 2 weeks ago.  The patient has associated jock itch.  The symptoms have been  persistent, gradually worsened.  nothing makes the symptoms worse and nothing makes symptoms better.  The patient denies fever, chills, headache, CP, SOB, dysphagia, nausea, vomiting, diarrhea, dysuria, hematuria.  Hx uncontrolled DM, HTN, depression.  Pt with sugars in the 500's.  Pt is out of his insulin as he has been unable to find a doctor since [facility] closed.  Patient endorses polyuria, polydipsia, polyphagia and weight loss. Pt c/o white plaques on his tongue and pain around the site of the plaques.  Pt also c/o "white cheese-like stuff" around the glans, under his foreskin.   Pt denies sexual activity, penile discharge or hx of STI.  He states he as an appointment with the [facility] tomorrow morning for F/U of his routine medications.      Past Medical History  Diagnosis Date  . Depression   . Diabetes mellitus   . Hyperlipidemia   . HTN (hypertension)   . Chronic headaches   . Osteoarthritis   . Kidney stones   . Polysubstance abuse     cocaine, marijuana-quit summer 2010  . Tobacco abuse   . Asthma   . Chest pain   . Lumbar disc disease   . GERD (gastroesophageal reflux disease)   . OSA (obstructive sleep apnea)   . Peripheral neuropathy 02/01/2011  . Bipolar 1 disorder     Past Surgical History  Procedure Date  . Knee surgery   . Bunionectomy 06-2009    Family History  Problem  Relation Age of Onset  . Heart disease Mother   . Diabetes Father   . Hyperlipidemia Father   . Emphysema Maternal Uncle   . Asthma Sister   . Asthma Mother   . Stroke Mother   . Hypertension Father   . Alzheimer's disease Maternal Grandmother   . Depression Father   . Breast cancer Maternal Aunt   . COPD Mother     maternal un    History  Substance Use Topics  . Smoking status: Current Everyday Smoker -- 0.2 packs/day for 17 years    Types: Cigarettes    Last Attempt to Quit: 02/24/2011  . Smokeless tobacco: Former Neurosurgeon  . Alcohol Use: Yes     socially      Review of Systems  Constitutional: Negative for fever, diaphoresis, appetite change, fatigue and unexpected weight change.  HENT: Positive for mouth sores. Negative for hearing loss, ear pain, nosebleeds, congestion, sore throat, sneezing, trouble swallowing, neck stiffness and voice change.   Eyes: Negative for visual disturbance.  Respiratory: Negative for cough, chest tightness, shortness of breath and wheezing.   Cardiovascular: Negative for chest pain.  Gastrointestinal: Negative for nausea, vomiting, abdominal pain, diarrhea and constipation.  Genitourinary: Negative for dysuria, urgency, frequency, hematuria, decreased urine volume, discharge, penile swelling, scrotal swelling, penile pain and testicular pain.  Skin: Negative for rash.  Neurological: Negative for syncope, light-headedness and  headaches.  Hematological: Does not bruise/bleed easily.  Psychiatric/Behavioral: Negative for disturbed wake/sleep cycle. The patient is not nervous/anxious.     Allergies  Iodine; Lipitor; Nystatin; and Statins  Home Medications   Current Outpatient Rx  Name Route Sig Dispense Refill  . ALBUTEROL SULFATE HFA 108 (90 BASE) MCG/ACT IN AERS Inhalation Inhale 2 puffs into the lungs every 6 (six) hours as needed. For shortness of breath    . ALUM & MAG HYDROXIDE-SIMETH 200-200-20 MG/5ML PO SUSP Oral Take 15 mLs by mouth  every 6 (six) hours as needed. For diarrhea    . ASPIRIN EC 81 MG PO TBEC Oral Take 81 mg by mouth daily.     . BUDESONIDE-FORMOTEROL FUMARATE 80-4.5 MCG/ACT IN AERO Inhalation Inhale 2 puffs into the lungs 2 (two) times daily.    . BUPROPION HCL ER (SR) 100 MG PO TB12 Oral Take 1 tablet (100 mg total) by mouth daily. 30 tablet 0  . BUPROPION HCL 100 MG PO TABS Oral Take 1 tablet (100 mg total) by mouth daily. 30 tablet 0  . CYCLOBENZAPRINE HCL 5 MG PO TABS Oral Take 5 mg by mouth 2 (two) times daily as needed. For muscle spasm    . ESOMEPRAZOLE MAGNESIUM 40 MG PO CPDR Oral Take 1 capsule (40 mg total) by mouth daily. 90 capsule 3  . OMEGA-3 FATTY ACIDS 1000 MG PO CAPS Oral Take 2 g by mouth daily.      [name] FLUCONAZOLE 200 MG PO TABS Oral Take 1 tablet (200 mg total) by mouth daily. 7 tablet 0  . GABAPENTIN 300 MG PO CAPS Oral Take 1 capsule (300 mg total) by mouth 3 (three) times daily. 150 capsule 0    Also take 2 capsules at bedtime.  [name] GABAPENTIN 300 MG PO CAPS Oral Take 1 capsule (300 mg total) by mouth 3 (three) times daily. 90 capsule 0  . GLIPIZIDE ER 10 MG PO TB24  1 tab per day for blood sugar more than 200 30 tablet 11  . GLUCOSAMINE 500 MG PO TABS Oral Take 1 tablet by mouth daily.     [name] GLUCOSE BLOOD VI STRP  PRN 100 each 5  . IBUPROFEN 800 MG PO TABS Oral Take 800 mg by mouth every 8 (eight) hours as needed. For pain    . INSULIN GLARGINE 100 UNIT/ML [place] SOLN Subcutaneous Inject 45 Units into the skin at bedtime. 10 mL 12  . INSULIN LISPRO (HUMAN) 100 UNIT/ML [place] SOLN Subcutaneous Inject 10 Units into the skin 3 (three) times daily before meals. 10 mL 12  . ONETOUCH ULTRASOFT LANCETS MISC  PRN 100 each 5  . LISINOPRIL 20 MG PO TABS Oral Take 20 mg by mouth daily.    [name] LORATADINE 10 MG PO TABS Oral Take 1 tablet (10 mg total) by mouth daily.    [name] METFORMIN HCL 500 MG PO TABS Oral Take 2 tablets (1,000 mg total) by mouth 2 (two) times daily with a meal. 120 tablet 11  . MICONAZOLE  NITRATE 2 % EX CREA Topical Apply topically 2 (two) times daily. 28.35 g 0  . MULTIVITAMINS PO CAPS Oral Take 1 capsule by mouth daily.      [name] NAPROXEN 500 MG PO TBEC Oral Take 1 tablet (500 mg total) by mouth 2 (two) times daily with a meal. 60 tablet 5  . SERTRALINE HCL 100 MG PO TABS Oral Take 200 mg by mouth daily.    . SERTRALINE HCL 100 MG  PO TABS Oral Take 2 tablets (200 mg total) by mouth daily. 60 tablet 0  . SALINE 0.65 % NA SOLN Nasal Place 1 spray into the nose as needed for congestion.    . TETRAHYDROZOLINE HCL 0.05 % OP SOLN Both Eyes Place 2 drops into both eyes 2 (two) times daily. For dry eyes    . TRAZODONE HCL 100 MG PO TABS Oral Take 100 mg by mouth at bedtime.      BP 110/72  Pulse 101  Temp 97.7 F (36.5 C) (Oral)  Resp 20  SpO2 99%  Physical Exam  Nursing note and vitals reviewed. Constitutional: He appears well-developed and well-nourished. No distress.  HENT:  Head: Normocephalic and atraumatic.  Right Ear: Tympanic membrane, external ear and ear canal normal.  Left Ear: Tympanic membrane, external ear and ear canal normal.  Nose: Nose normal.  Mouth/Throat: Uvula is midline and oropharynx is clear and moist. Oral lesions present. No uvula swelling. No oropharyngeal exudate.         White plaques on the sides of the tongues, oral mucosa and oropharynx that scraped off with an erythematous base.  Eyes: Conjunctivae are normal. No scleral icterus.  Neck: Normal range of motion. Neck supple.  Cardiovascular: Normal rate, regular rhythm and intact distal pulses.   Pulmonary/Chest: Effort normal and breath sounds normal. No respiratory distress. He has no wheezes.  Abdominal: Soft. Bowel sounds are normal. He exhibits no mass. There is no tenderness. There is no rebound and no guarding.  Genitourinary: Testes normal.    Circumcised. Penile erythema and penile tenderness present. No discharge found.       Pain to palpation and erythema at the base of the  glans. No white discharge seen however patient states this is where it is located when he sees a. No discharge seen from the tip of the penis. No scrotal tenderness.  Musculoskeletal: Normal range of motion. He exhibits no edema.  Neurological: He is alert.       Speech is clear and goal oriented Moves extremities without ataxia  Skin: Skin is warm and dry. He is not diaphoretic.  Psychiatric: He has a normal mood and affect.    ED Course  Procedures (including critical care time)  Labs Reviewed  URINALYSIS, ROUTINE W REFLEX MICROSCOPIC - Abnormal; Notable for the following:    Specific Gravity, Urine 1.035 (*)     Glucose, UA >1000 (*)     Ketones, ur 15 (*)     All other components within normal limits  GLUCOSE, CAPILLARY - Abnormal; Notable for the following:    Glucose-Capillary >600 (*)     All other components within normal limits  BASIC METABOLIC PANEL - Abnormal; Notable for the following:    Sodium 130 (*)     Chloride 86 (*)     Glucose, Bld 693 (*)     Calcium 10.8 (*)     GFR calc non Af Amer 85 (*)     All other components within normal limits  KETONES, QUALITATIVE - Abnormal; Notable for the following:    Acetone, Bld SMALL (*)     All other components within normal limits  POCT I-STAT 3, BLOOD GAS (G3P V) - Abnormal; Notable for the following:    pH, Ven 7.355 (*)     Bicarbonate 26.7 (*)     All other components within normal limits  GLUCOSE, CAPILLARY - Abnormal; Notable for the following:    Glucose-Capillary 496 (*)  All other components within normal limits  BASIC METABOLIC PANEL - Abnormal; Notable for the following:    Sodium 132 (*)     Chloride 92 (*)     Glucose, Bld 558 (*)     GFR calc non Af Amer 85 (*)     All other components within normal limits  GLUCOSE, CAPILLARY - Abnormal; Notable for the following:    Glucose-Capillary 380 (*)     All other components within normal limits  URINE MICROSCOPIC-ADD ON  CBC WITH DIFFERENTIAL   No  results found. Results for orders placed during the hospital encounter of 04/02/12  URINALYSIS, ROUTINE W REFLEX MICROSCOPIC      Component Value Range   Color, Urine YELLOW  YELLOW   APPearance CLEAR  CLEAR   Specific Gravity, Urine 1.035 (*) 1.005 - 1.030   pH 5.5  5.0 - 8.0   Glucose, UA >1000 (*) NEGATIVE mg/dL   Hgb urine dipstick NEGATIVE  NEGATIVE   Bilirubin Urine NEGATIVE  NEGATIVE   Ketones, ur 15 (*) NEGATIVE mg/dL   Protein, ur NEGATIVE  NEGATIVE mg/dL   Urobilinogen, UA 0.2  0.0 - 1.0 mg/dL   Nitrite NEGATIVE  NEGATIVE   Leukocytes, UA NEGATIVE  NEGATIVE  URINE MICROSCOPIC-ADD ON      Component Value Range   WBC, UA 0-2  <3 WBC/hpf   Bacteria, UA RARE  RARE  GLUCOSE, CAPILLARY      Component Value Range   Glucose-Capillary >600 (*) 70 - 99 mg/dL   Comment 1 Notify RN     Comment 2 Documented in Chart     Comment 3 Call MD NNP PA CNM    CBC WITH DIFFERENTIAL      Component Value Range   WBC 7.7  4.0 - 10.5 K/uL   RBC 4.70  4.22 - 5.81 MIL/uL   Hemoglobin 14.7  13.0 - 17.0 g/dL   HCT 91.4  78.2 - 95.6 %   MCV 88.5  78.0 - 100.0 fL   MCH 31.3  26.0 - 34.0 pg   MCHC 35.3  30.0 - 36.0 g/dL   RDW 21.3  08.6 - 57.8 %   Platelets 211  150 - 400 K/uL   Neutrophils Relative 63  43 - 77 %   Neutro Abs 4.9  1.7 - 7.7 K/uL   Lymphocytes Relative 27  12 - 46 %   Lymphs Abs 2.0  0.7 - 4.0 K/uL   Monocytes Relative 9  3 - 12 %   Monocytes Absolute 0.7  0.1 - 1.0 K/uL   Eosinophils Relative 1  0 - 5 %   Eosinophils Absolute 0.1  0.0 - 0.7 K/uL   Basophils Relative 0  0 - 1 %   Basophils Absolute 0.0  0.0 - 0.1 K/uL  BASIC METABOLIC PANEL      Component Value Range   Sodium 130 (*) 135 - 145 mEq/L   Potassium 4.3  3.5 - 5.1 mEq/L   Chloride 86 (*) 96 - 112 mEq/L   CO2 24  19 - 32 mEq/L   Glucose, Bld 693 (*) 70 - 99 mg/dL   BUN 19  6 - 23 mg/dL   Creatinine, Ser 4.69  0.50 - 1.35 mg/dL   Calcium 62.9 (*) 8.4 - 10.5 mg/dL   GFR calc non Af Amer 85 (*) >90 mL/min    GFR calc Af Amer >90  >90 mL/min  KETONES, QUALITATIVE      Component Value  Range   Acetone, Bld SMALL (*) NEGATIVE  POCT I-STAT 3, BLOOD GAS (G3P V)      Component Value Range   pH, Ven 7.355 (*) 7.250 - 7.300   pCO2, Ven 47.5  45.0 - 50.0 mmHg   pO2, Ven 41.0  30.0 - 45.0 mmHg   Bicarbonate 26.7 (*) 20.0 - 24.0 mEq/L   TCO2 28  0 - 100 mmol/L   O2 Saturation 74.0     Patient temperature 97.7 F     Collection site IV START     Drawn by Nurse     Sample type VENOUS    GLUCOSE, CAPILLARY      Component Value Range   Glucose-Capillary 496 (*) 70 - 99 mg/dL   Comment 1 Notify RN    BASIC METABOLIC PANEL      Component Value Range   Sodium 132 (*) 135 - 145 mEq/L   Potassium 4.0  3.5 - 5.1 mEq/L   Chloride 92 (*) 96 - 112 mEq/L   CO2 25  19 - 32 mEq/L   Glucose, Bld 558 (*) 70 - 99 mg/dL   BUN 18  6 - 23 mg/dL   Creatinine, Ser 1.61  0.50 - 1.35 mg/dL   Calcium 9.9  8.4 - 09.6 mg/dL   GFR calc non Af Amer 85 (*) >90 mL/min   GFR calc Af Amer >90  >90 mL/min  GLUCOSE, CAPILLARY      Component Value Range   Glucose-Capillary 380 (*) 70 - 99 mg/dL   Comment 1 Notify RN     Comment 2 Documented in Chart     No results found.    1. Oral candida   2. Candidal balanitis   3. Hyperglycemia       MDM  Wayne [name] presents with tongue pain, penile pain.  She will history of uncontrolled diabetes with blood sugars in the 500s. For more than a few weeks. Gradual increase in pain in both areas for the past week.  Likely candida infection of both the oral mucosa and balanitis.  Will treat with anti-fungals.  Recommended that the patient to mention this at his appointment tomorrow morning with the physician.    Patient also with blood sugar greater than 600. He endorses polyuria polydipsia and polyphagia.   Will give fluids and insulin here in the emergency department to bring his blood sugar down.  Urine with ketones and glucose.  Anion gap of 20.  Insulin and fluids given.   Re-check of CBG was 496.  VBG is without evidence of acidosis.  Will re-dose insulin, fluid bolus and re-check BMP for re-eval of anion gap.  Repeat glucose 380.  Repeat anion gap is 15.  Patient remains alert and oriented. He states he feels much better.  I discussed with him the importance of following up tomorrow morning with his physician and obtaining the supplies that he needs to regulate his blood glucose. I have also discussed reasons to return immediately to the ER.  Patient expresses understanding and agrees with plan.  1. Medications: Mouthwash, fluconazole, miconazole cream. 2. Treatment: Use medications as described 3. Follow Up: With physician in the morning about hyperglycemia and candidal infection.        Dahlia Client Cannon Arreola, PA-C 04/02/12 1930

## 2012-04-02 NOTE — ED Notes (Signed)
The patient's CBG reads greater than 600.

## 2012-04-08 NOTE — ED Provider Notes (Signed)
Medical screening examination/treatment/procedure(s) were conducted as a shared visit with non-physician practitioner(s) and myself.  I personally evaluated the patient during the encounter  Cyndra Numbers, MD 04/08/12 1431

## 2012-05-11 ENCOUNTER — Encounter (HOSPITAL_BASED_OUTPATIENT_CLINIC_OR_DEPARTMENT_OTHER): Payer: Self-pay | Admitting: Emergency Medicine

## 2012-05-11 ENCOUNTER — Emergency Department (HOSPITAL_BASED_OUTPATIENT_CLINIC_OR_DEPARTMENT_OTHER): Payer: Self-pay

## 2012-05-11 ENCOUNTER — Emergency Department (HOSPITAL_BASED_OUTPATIENT_CLINIC_OR_DEPARTMENT_OTHER)
Admission: EM | Admit: 2012-05-11 | Discharge: 2012-05-11 | Disposition: A | Discharge status: Home / Self Care | Payer: Self-pay | Attending: Emergency Medicine | Admitting: Emergency Medicine

## 2012-05-11 DIAGNOSIS — M1990 Unspecified osteoarthritis, unspecified site: Secondary | ICD-10-CM | POA: Insufficient documentation

## 2012-05-11 DIAGNOSIS — I10 Essential (primary) hypertension: Secondary | ICD-10-CM | POA: Insufficient documentation

## 2012-05-11 DIAGNOSIS — K5900 Constipation, unspecified: Secondary | ICD-10-CM | POA: Insufficient documentation

## 2012-05-11 DIAGNOSIS — F319 Bipolar disorder, unspecified: Secondary | ICD-10-CM | POA: Insufficient documentation

## 2012-05-11 DIAGNOSIS — E119 Type 2 diabetes mellitus without complications: Secondary | ICD-10-CM | POA: Insufficient documentation

## 2012-05-11 DIAGNOSIS — Z794 Long term (current) use of insulin: Secondary | ICD-10-CM | POA: Insufficient documentation

## 2012-05-11 DIAGNOSIS — G4733 Obstructive sleep apnea (adult) (pediatric): Secondary | ICD-10-CM | POA: Insufficient documentation

## 2012-05-11 DIAGNOSIS — Z79899 Other long term (current) drug therapy: Secondary | ICD-10-CM | POA: Insufficient documentation

## 2012-05-11 DIAGNOSIS — R739 Hyperglycemia, unspecified: Secondary | ICD-10-CM

## 2012-05-11 DIAGNOSIS — E785 Hyperlipidemia, unspecified: Secondary | ICD-10-CM | POA: Insufficient documentation

## 2012-05-11 DIAGNOSIS — F17200 Nicotine dependence, unspecified, uncomplicated: Secondary | ICD-10-CM | POA: Insufficient documentation

## 2012-05-11 DIAGNOSIS — R109 Unspecified abdominal pain: Secondary | ICD-10-CM

## 2012-05-11 DIAGNOSIS — K219 Gastro-esophageal reflux disease without esophagitis: Secondary | ICD-10-CM | POA: Insufficient documentation

## 2012-05-11 LAB — URINALYSIS, ROUTINE W REFLEX MICROSCOPIC
Leukocytes, UA: NEGATIVE
Nitrite: NEGATIVE
Specific Gravity, Urine: 1.034 — ABNORMAL HIGH (ref 1.005–1.030)
Urobilinogen, UA: 0.2 mg/dL (ref 0.0–1.0)

## 2012-05-11 LAB — COMPREHENSIVE METABOLIC PANEL
ALT: 18 U/L (ref 0–53)
AST: 21 U/L (ref 0–37)
Alkaline Phosphatase: 139 U/L — ABNORMAL HIGH (ref 39–117)
CO2: 28 mEq/L (ref 19–32)
Calcium: 9.8 mg/dL (ref 8.4–10.5)
Chloride: 97 mEq/L (ref 96–112)
GFR calc non Af Amer: >90 mL/min (ref >90)
Potassium: 3.7 mEq/L (ref 3.5–5.1)
Sodium: 135 mEq/L (ref 135–145)
Total Bilirubin: 0.4 mg/dL (ref 0.3–1.2)

## 2012-05-11 LAB — URINE MICROSCOPIC-ADD ON

## 2012-05-11 LAB — GLUCOSE, CAPILLARY: Glucose-Capillary: 344 mg/dL — ABNORMAL HIGH (ref 70–99)

## 2012-05-11 LAB — KETONES, QUALITATIVE: Acetone, Bld: NEGATIVE

## 2012-05-11 MED ORDER — LACTATED RINGERS IV BOLUS (SEPSIS)
1000.0000 mL | Freq: Once | INTRAVENOUS | Status: AC
  Administered 2012-05-11: 1000 mL via INTRAVENOUS

## 2012-05-11 MED ORDER — DISPOSABLE ENEMA 19-7 GM/118ML RE ENEM: 1.0000 | ENEMA | Freq: Once | RECTAL | Status: DC

## 2012-05-11 MED ORDER — POLYETHYLENE GLYCOL 3350 17 GM/SCOOP PO POWD: 17.0000 g | Freq: Every day | ORAL | Status: DC

## 2012-05-11 MED ORDER — LACTATED RINGERS IV BOLUS (SEPSIS): 1000.0000 mL | Freq: Once | INTRAVENOUS | Status: DC

## 2012-05-11 MED ORDER — DOCUSATE SODIUM 100 MG PO CAPS: 100.0000 mg | ORAL_CAPSULE | Freq: Two times a day (BID) | ORAL | Status: DC

## 2012-05-11 NOTE — ED Provider Notes (Signed)
History     CSN: [id number]  Arrival date & time 05/11/12  1308   None     Chief Complaint  Patient presents with  . Altered Mental Status    (Consider location/radiation/quality/duration/timing/severity/associated sxs/prior treatment) HPI Wayne [name] is a 36 y.o. male with a long history of medication noncompliance diabetes mellitus, hypertension and previous history of kidney stones and UTI presents with one to 2 weeks of left flank pain it is dull in the back and occasionally sharp in the front, the pain is moderate to severe. Patient is had a history of stones in the distant past. He endorses polyuria and polydipsia, dizziness, shortness of breath which helped by using his inhaler as he also has a history of asthma (this feels like his typical asthma symptoms). He denies any chest pain, fevers. She complains that he is "dry." Patient says he's not taking his medications as prescribed because he is trying to conserve them due to cost issues. Patient's also complaining about some mild penile discomfort around the head of the penis. No discharge or lesions.   Patient previously was followed by primary care physician at [facility] Dr. Jonny Ruiz, was dismissed from the practice for failure to show up on multiple occasions. Patient denies any alcohol abuse or recreational drugs.   Past Medical History  Diagnosis Date  . Depression   . Diabetes mellitus   . Hyperlipidemia   . HTN (hypertension)   . Chronic headaches   . Osteoarthritis   . Kidney stones   . Polysubstance abuse     cocaine, marijuana-quit summer 2010  . Tobacco abuse   . Asthma   . Chest pain   . Lumbar disc disease   . GERD (gastroesophageal reflux disease)   . OSA (obstructive sleep apnea)   . Peripheral neuropathy 02/01/2011  . Bipolar 1 disorder     Past Surgical History  Procedure Date  . Knee surgery   . Bunionectomy 06-2009    Family History  Problem Relation Age of Onset  . Heart disease Mother   .  Diabetes Father   . Hyperlipidemia Father   . Emphysema Maternal Uncle   . Asthma Sister   . Asthma Mother   . Stroke Mother   . Hypertension Father   . Alzheimer's disease Maternal Grandmother   . Depression Father   . Breast cancer Maternal Aunt   . COPD Mother     maternal un    History  Substance Use Topics  . Smoking status: Current Every Day Smoker -- 0.2 packs/day for 17 years    Types: Cigarettes    Last Attempt to Quit: 02/24/2011  . Smokeless tobacco: Former Neurosurgeon  . Alcohol Use: Yes     socially      Review of Systems At least 10pt or greater review of systems completed and are negative except where specified in the HPI.  Allergies  Iodine; Lipitor; Nystatin; and Statins  Home Medications   Current Outpatient Rx  Name Route Sig Dispense Refill  . ALBUTEROL SULFATE HFA 108 (90 BASE) MCG/ACT IN AERS Inhalation Inhale 2 puffs into the lungs every 6 (six) hours as needed. For shortness of breath    . ALUM & MAG HYDROXIDE-SIMETH 200-200-20 MG/5ML PO SUSP Oral Take 15 mLs by mouth every 6 (six) hours as needed. For diarrhea    . ASPIRIN EC 81 MG PO TBEC Oral Take 81 mg by mouth daily.     . BUDESONIDE-FORMOTEROL FUMARATE 80-4.5 MCG/ACT IN AERO  Inhalation Inhale 2 puffs into the lungs 2 (two) times daily.    . BUPROPION HCL ER (SR) 100 MG PO TB12 Oral Take 1 tablet (100 mg total) by mouth daily. 30 tablet 0  . BUPROPION HCL 100 MG PO TABS Oral Take 1 tablet (100 mg total) by mouth daily. 30 tablet 0  . CYCLOBENZAPRINE HCL 5 MG PO TABS Oral Take 5 mg by mouth 2 (two) times daily as needed. For muscle spasm    . FIRST-DUKES MOUTHWASH MT SUSP Mouth/Throat Use as directed 10 mLs in the mouth or throat 4 (four) times daily as needed. 237 mL 0  . ESOMEPRAZOLE MAGNESIUM 40 MG PO CPDR Oral Take 1 capsule (40 mg total) by mouth daily. 90 capsule 3  . GABAPENTIN 300 MG PO CAPS Oral Take 1 capsule (300 mg total) by mouth 3 (three) times daily. 90 capsule 0  . GLIPIZIDE ER 10 MG  PO TB24  1 tab per day for blood sugar more than 200 30 tablet 11  . GLUCOSAMINE 500 MG PO TABS Oral Take 1 tablet by mouth daily.     [name] GLUCOSE BLOOD VI STRP  PRN 100 each 5  . IBUPROFEN 800 MG PO TABS Oral Take 800 mg by mouth every 8 (eight) hours as needed. For pain    . INSULIN GLARGINE 100 UNIT/ML [place] SOLN Subcutaneous Inject 45 Units into the skin at bedtime. 10 mL 12  . INSULIN LISPRO (HUMAN) 100 UNIT/ML [place] SOLN Subcutaneous Inject 10 Units into the skin 3 (three) times daily before meals. 10 mL 12  . ONETOUCH ULTRASOFT LANCETS MISC  PRN 100 each 5  . LISINOPRIL 20 MG PO TABS Oral Take 20 mg by mouth daily.    [name] LORATADINE 10 MG PO TABS Oral Take 1 tablet (10 mg total) by mouth daily.    [name] MICONAZOLE NITRATE 2 % EX CREA Topical Apply topically 2 (two) times daily. 28.35 g 0  . MULTIVITAMINS PO CAPS Oral Take 1 capsule by mouth daily.      [name] NAPROXEN 500 MG PO TBEC Oral Take 1 tablet (500 mg total) by mouth 2 (two) times daily with a meal. 60 tablet 5  . SERTRALINE HCL 100 MG PO TABS Oral Take 200 mg by mouth daily.    . SERTRALINE HCL 100 MG PO TABS Oral Take 2 tablets (200 mg total) by mouth daily. 60 tablet 0  . SALINE 0.65 % NA SOLN Nasal Place 1 spray into the nose as needed for congestion.    . TETRAHYDROZOLINE HCL 0.05 % OP SOLN Both Eyes Place 2 drops into both eyes 2 (two) times daily. For dry eyes    . TRAZODONE HCL 100 MG PO TABS Oral Take 100 mg by mouth at bedtime.      BP 111/75  Pulse 79  Temp 97.7 F (36.5 C) (Oral)  Resp 16  Ht 6\' 1"  (1.854 m)  Wt 189 lb (85.73 kg)  BMI 24.94 kg/m2  SpO2 98%  Physical Exam  Nursing notes reviewed.  Electronic medical record reviewed. VITAL SIGNS:   Filed Vitals:   05/11/12 0816  BP: 111/75  Pulse: 79  Temp: 97.7 F (36.5 C)  TempSrc: Oral  Resp: 16  Height: 6\' 1"  (1.854 m)  Weight: 189 lb (85.73 kg)  SpO2: 98%   CONSTITUTIONAL: Awake, oriented, appears non-toxic HENT: Atraumatic, normocephalic, oral mucosa pink and  moist, airway patent. Nares patent without drainage. External ears normal. EYES: Conjunctiva clear, EOMI, PERRLA  NECK: Trachea midline, non-tender, supple CARDIOVASCULAR: Normal heart rate, Normal rhythm, No murmurs, rubs, gallops PULMONARY/CHEST: Clear to auscultation, no rhonchi, wheezes, or rales. Symmetrical breath sounds. Non-tender. ABDOMINAL: Non-distended, soft, non-tender - no rebound or guarding.  BS normal. NEUROLOGIC: Non-focal, moving all four extremities, no gross sensory or motor deficits. GU: Normal circumcised male. Notable adenitis. [place]ona mildly irritated. No tenderness to palpation. No lesions. EXTREMITIES: No clubbing, cyanosis, or edema SKIN: Warm, Dry, No erythema, No rash  ED Course  Procedures (including critical care time)  Labs Reviewed  GLUCOSE, CAPILLARY - Abnormal; Notable for the following:    Glucose-Capillary 344 (*)     All other components within normal limits  COMPREHENSIVE METABOLIC PANEL - Abnormal; Notable for the following:    Glucose, Bld 358 (*)     Alkaline Phosphatase 139 (*)     All other components within normal limits  URINALYSIS, ROUTINE W REFLEX MICROSCOPIC - Abnormal; Notable for the following:    Specific Gravity, Urine 1.034 (*)     Glucose, UA >1000 (*)     All other components within normal limits  KETONES, QUALITATIVE  URINE MICROSCOPIC-ADD ON   Dg Abd 2 Views  05/11/2012  *RADIOLOGY REPORT*  Clinical Data: Difficult to arouse, UTI, history kidney stones, depression, diabetes, hypertension, hyperlipidemia, asthma  ABDOMEN - 2 VIEW  Comparison: 07/13/2010  Findings: Lung bases clear. Increased stool diffusely throughout colon. No small bowel dilatation or bowel wall thickening identified. No free intraperitoneal air. Bones unremarkable. Small left pelvic phlebolith stable. No definite urinary tract calcification.  IMPRESSION: Increased stool throughout colon. No urinary tract calcification identified.   Original Report Authenticated  By: Lollie Marrow, M.D.      1. Abdominal pain   2. Hyperglycemia   3. Constipation       MDM  Zayvon A Griep is a 36 y.o. male Patient appears well-hydrated however is mildly hyperglycemic. This is consistent with his admission of noncompliance secondary to not using his medications as prescribed. I do not think he is in DKA. Think is report of shortness of breath is related to asthma and nothing more severe.  Will obtain some screening labs as he does have a history of stones and is complaining about some left flank pain. We'll obtain a urine, also screen for DKA with acetone BMP, and get an x-ray of the abdomen.   Labs show some mild hyperglycemia glucose of 358, is not acidotic, has no acetone in serum. Patient does have some increased stool throughout the colon however no radiopaque calcifications are noted. No blood in the urine. No indication of urinary tract infection.  Patient is feeling better after 2 L of fluid. Patient be discharged to a bowel regimen for constipation likely causing his abdominal pain. Patient is also given resource guide for which she can find a primary care physician   I explained the diagnosis and have given explicit precautions to return to the ER including blood in urine, worsening pain, vomiting  or any other new or worsening symptoms. The patient understands and accepts the medical plan as it's been dictated and I have answered their questions. Discharge instructions concerning home care and prescriptions have been given.  The patient is STABLE and is discharged to home in good condition.         Wayne Skene, MD 05/12/12 [phone]

## 2012-05-11 NOTE — ED Notes (Signed)
CBG 344mg Excell Seltzer

## 2012-05-11 NOTE — ED Notes (Signed)
EMS reports family had a hard time getting the patient awake this am.  He does not recall the events.  He has had UTI sx's.  He reports was discharged from [facility] and no longer has a PMD.

## 2012-05-11 NOTE — ED Notes (Signed)
#  20G angiocath removed from left forearm.  Site unremarkable, tip intact,

## 2012-05-29 ENCOUNTER — Emergency Department (HOSPITAL_COMMUNITY): Payer: Self-pay

## 2012-05-29 ENCOUNTER — Encounter (HOSPITAL_COMMUNITY): Payer: Self-pay | Admitting: *Deleted

## 2012-05-29 ENCOUNTER — Emergency Department (HOSPITAL_COMMUNITY)
Admission: EM | Admit: 2012-05-29 | Discharge: 2012-05-29 | Disposition: A | Discharge status: Home / Self Care | Payer: Self-pay | Attending: Emergency Medicine | Admitting: Emergency Medicine

## 2012-05-29 DIAGNOSIS — G4733 Obstructive sleep apnea (adult) (pediatric): Secondary | ICD-10-CM | POA: Insufficient documentation

## 2012-05-29 DIAGNOSIS — E1110 Type 2 diabetes mellitus with ketoacidosis without coma: Secondary | ICD-10-CM | POA: Insufficient documentation

## 2012-05-29 DIAGNOSIS — R739 Hyperglycemia, unspecified: Secondary | ICD-10-CM

## 2012-05-29 DIAGNOSIS — M1990 Unspecified osteoarthritis, unspecified site: Secondary | ICD-10-CM | POA: Insufficient documentation

## 2012-05-29 DIAGNOSIS — K219 Gastro-esophageal reflux disease without esophagitis: Secondary | ICD-10-CM | POA: Insufficient documentation

## 2012-05-29 DIAGNOSIS — K5900 Constipation, unspecified: Secondary | ICD-10-CM | POA: Insufficient documentation

## 2012-05-29 DIAGNOSIS — Z794 Long term (current) use of insulin: Secondary | ICD-10-CM | POA: Insufficient documentation

## 2012-05-29 DIAGNOSIS — F319 Bipolar disorder, unspecified: Secondary | ICD-10-CM | POA: Insufficient documentation

## 2012-05-29 DIAGNOSIS — Z7982 Long term (current) use of aspirin: Secondary | ICD-10-CM | POA: Insufficient documentation

## 2012-05-29 DIAGNOSIS — Z87891 Personal history of nicotine dependence: Secondary | ICD-10-CM | POA: Insufficient documentation

## 2012-05-29 DIAGNOSIS — E1310 Other specified diabetes mellitus with ketoacidosis without coma: Secondary | ICD-10-CM

## 2012-05-29 DIAGNOSIS — I10 Essential (primary) hypertension: Secondary | ICD-10-CM | POA: Insufficient documentation

## 2012-05-29 DIAGNOSIS — F1910 Other psychoactive substance abuse, uncomplicated: Secondary | ICD-10-CM | POA: Insufficient documentation

## 2012-05-29 DIAGNOSIS — R109 Unspecified abdominal pain: Secondary | ICD-10-CM | POA: Insufficient documentation

## 2012-05-29 DIAGNOSIS — Z87442 Personal history of urinary calculi: Secondary | ICD-10-CM | POA: Insufficient documentation

## 2012-05-29 DIAGNOSIS — J45909 Unspecified asthma, uncomplicated: Secondary | ICD-10-CM | POA: Insufficient documentation

## 2012-05-29 DIAGNOSIS — M5137 Other intervertebral disc degeneration, lumbosacral region: Secondary | ICD-10-CM | POA: Insufficient documentation

## 2012-05-29 DIAGNOSIS — M51379 Other intervertebral disc degeneration, lumbosacral region without mention of lumbar back pain or lower extremity pain: Secondary | ICD-10-CM | POA: Insufficient documentation

## 2012-05-29 DIAGNOSIS — Z79899 Other long term (current) drug therapy: Secondary | ICD-10-CM | POA: Insufficient documentation

## 2012-05-29 LAB — BASIC METABOLIC PANEL
BUN: 29 mg/dL — ABNORMAL HIGH (ref 6–23)
Chloride: 93 mEq/L — ABNORMAL LOW (ref 96–112)
GFR calc Af Amer: >90 mL/min (ref >90)
Glucose, Bld: 496 mg/dL — ABNORMAL HIGH (ref 70–99)
Potassium: 4.7 mEq/L (ref 3.5–5.1)

## 2012-05-29 LAB — URINALYSIS, ROUTINE W REFLEX MICROSCOPIC
Hgb urine dipstick: NEGATIVE
Nitrite: NEGATIVE
Specific Gravity, Urine: 1.036 — ABNORMAL HIGH (ref 1.005–1.030)
Urobilinogen, UA: 0.2 mg/dL (ref 0.0–1.0)

## 2012-05-29 LAB — GLUCOSE, CAPILLARY
Glucose-Capillary: 554 mg/dL (ref 70–99)
Glucose-Capillary: 441 mg/dL — ABNORMAL HIGH (ref 70–99)

## 2012-05-29 LAB — URINE MICROSCOPIC-ADD ON

## 2012-05-29 LAB — ETHANOL: Alcohol, Ethyl (B): <11 mg/dL (ref 0–11)

## 2012-05-29 MED ORDER — INSULIN ASPART 100 UNIT/ML ~~LOC~~ SOLN
10.0000 [IU] | Freq: Once | SUBCUTANEOUS | Status: AC
  Administered 2012-05-29: 10 [IU] via SUBCUTANEOUS
  Filled 2012-05-29: qty 1

## 2012-05-29 MED ORDER — GLIPIZIDE ER 10 MG PO TB24: ORAL_TABLET | ORAL | Status: DC

## 2012-05-29 MED ORDER — FLEET ENEMA 7-19 GM/118ML RE ENEM
1.0000 | ENEMA | Freq: Once | RECTAL | Status: AC
  Administered 2012-05-29: 1 via RECTAL
  Filled 2012-05-29: qty 1

## 2012-05-29 MED ORDER — LACTATED RINGERS IV BOLUS (SEPSIS)
2000.0000 mL | Freq: Once | INTRAVENOUS | Status: AC
  Administered 2012-05-29: 2000 mL via INTRAVENOUS

## 2012-05-29 MED ORDER — INSULIN REGULAR HUMAN 100 UNIT/ML IJ SOLN
10.0000 [IU] | Freq: Once | INTRAMUSCULAR | Status: DC
  Filled 2012-05-29: qty 0.1

## 2012-05-29 MED ORDER — INSULIN GLARGINE 100 UNIT/ML ~~LOC~~ SOLN: 45.0000 [IU] | Freq: Every day | SUBCUTANEOUS | Status: DC

## 2012-05-29 MED ORDER — POLYETHYLENE GLYCOL 3350 17 GM/SCOOP PO POWD: 17.0000 g | Freq: Three times a day (TID) | ORAL | Status: DC

## 2012-05-29 MED ORDER — DISPOSABLE ENEMA 19-7 GM/118ML RE ENEM: 3.0000 | ENEMA | Freq: Once | RECTAL | Status: DC

## 2012-05-29 MED ORDER — INSULIN LISPRO 100 UNIT/ML ~~LOC~~ SOLN: 10.0000 [IU] | Freq: Three times a day (TID) | SUBCUTANEOUS | Status: DC

## 2012-05-29 NOTE — ED Notes (Signed)
Per pt, he had results from the enema: large brown formed stool.

## 2012-05-29 NOTE — ED Provider Notes (Signed)
History     CSN: [id number]  Arrival date & time 05/29/12  1404   First MD Initiated Contact with Patient 05/29/12 1602      Chief Complaint  Patient presents with  . Abdominal Pain  . Constipation    (Consider location/radiation/quality/duration/timing/severity/associated sxs/prior treatment) HPIAsmar A [name] is a 36 y.o. male presenting from home with multiple complaints. I saw this patient in the [facility] ED on October 17.  Patient had been kicked out of Dr. Melvyn Novas practice due to missed appointments and had been getting his insulin from that practice. He did not mention this the last time he was in the emergency department. He's been barring his sister's insulin-she is let him know that he can no longer do this. He has had an increase in polyuria and polydipsia he thinks his blood sugars been running high. Also had problems with constipation, he has not had a bowel movement in one week. He did have good results with using a Fleet enema however did not realize he get this over-the-counter. He has been taking Dulcolax and MiraLax twice a day.   Patient says he is supposed to see [facility] but cannot see them until January when his "oh, care" goes into effect. Patient denies any vomiting but has had some dry heaving and nausea. He said some occasional left lower quadrant pain which is been moderate to severe, it comes and goes, is crampy not associated with bloody stools or black stools.   Past Medical History  Diagnosis Date  . Depression   . Diabetes mellitus   . Hyperlipidemia   . HTN (hypertension)   . Chronic headaches   . Osteoarthritis   . Kidney stones   . Polysubstance abuse     cocaine, marijuana-quit summer 2010  . Tobacco abuse   . Asthma   . Chest pain   . Lumbar disc disease   . GERD (gastroesophageal reflux disease)   . OSA (obstructive sleep apnea)   . Peripheral neuropathy 02/01/2011  . Bipolar 1 disorder     Past Surgical  History  Procedure Date  . Knee surgery   . Bunionectomy 06-2009    Family History  Problem Relation Age of Onset  . Heart disease Mother   . Diabetes Father   . Hyperlipidemia Father   . Emphysema Maternal Uncle   . Asthma Sister   . Asthma Mother   . Stroke Mother   . Hypertension Father   . Alzheimer's disease Maternal Grandmother   . Depression Father   . Breast cancer Maternal Aunt   . COPD Mother     maternal un    History  Substance Use Topics  . Smoking status: Former Smoker -- 0.2 packs/day for 17 years    Types: Cigarettes    Quit date: 02/24/2011  . Smokeless tobacco: Former Neurosurgeon  . Alcohol Use: Yes     Comment: socially      Review of Systems At least 10pt or greater review of systems completed and are negative except where specified in the HPI.  Allergies  Iodine; Lipitor; Nystatin; and Statins  Home Medications   Current Outpatient Rx  Name  Route  Sig  Dispense  Refill  . ALBUTEROL SULFATE HFA 108 (90 BASE) MCG/ACT IN AERS   Inhalation   Inhale 2 puffs into the lungs every 6 (six) hours as needed. For shortness of breath         . ALUM & MAG HYDROXIDE-SIMETH  200-200-20 MG/5ML PO SUSP   Oral   Take 15 mLs by mouth every 6 (six) hours as needed. For diarrhea         . ASPIRIN EC 81 MG PO TBEC   Oral   Take 81 mg by mouth daily.          . BUDESONIDE-FORMOTEROL FUMARATE 80-4.5 MCG/ACT IN AERO   Inhalation   Inhale 2 puffs into the lungs 2 (two) times daily.         . BUPROPION HCL ER (SR) 100 MG PO TB12   Oral   Take 1 tablet (100 mg total) by mouth daily.   30 tablet   0   . BUPROPION HCL 100 MG PO TABS   Oral   Take 1 tablet (100 mg total) by mouth daily.   30 tablet   0   . CYCLOBENZAPRINE HCL 5 MG PO TABS   Oral   Take 5 mg by mouth 2 (two) times daily as needed. For muscle spasm         . DOCUSATE SODIUM 100 MG PO CAPS   Oral   Take 1 capsule (100 mg total) by mouth every 12 (twelve) hours.   60 capsule   0     . GABAPENTIN 300 MG PO CAPS   Oral   Take 1 capsule (300 mg total) by mouth 3 (three) times daily.   90 capsule   0   . GLIPIZIDE ER 10 MG PO TB24      1 tab per day for blood sugar more than 200   30 tablet   11   . GLUCOSAMINE 500 MG PO TABS   Oral   Take 1 tablet by mouth daily.          . IBUPROFEN 800 MG PO TABS   Oral   Take 800 mg by mouth every 8 (eight) hours as needed. For pain         . LORATADINE 10 MG PO TABS   Oral   Take 1 tablet (10 mg total) by mouth daily.         [name] MICONAZOLE NITRATE 2 % EX CREA   Topical   Apply topically 2 (two) times daily.   28.35 g   0   . MULTIVITAMINS PO CAPS   Oral   Take 1 capsule by mouth daily.           [name] POLYETHYLENE GLYCOL 3350 PO POWD   Oral   Take 17 g by mouth daily.   255 g   0   . SERTRALINE HCL 100 MG PO TABS   Oral   Take 2 tablets (200 mg total) by mouth daily.   60 tablet   0   . TETRAHYDROZOLINE HCL 0.05 % OP SOLN   Both Eyes   Place 2 drops into both eyes 2 (two) times daily. For dry eyes         . TRAZODONE HCL 100 MG PO TABS   Oral   Take 100 mg by mouth at bedtime.         [name] FIRST-DUKES MOUTHWASH MT SUSP   Mouth/Throat   Use as directed 10 mLs in the mouth or throat 4 (four) times daily as needed.   237 mL   0   . ESOMEPRAZOLE MAGNESIUM 40 MG PO CPDR   Oral   Take 1 capsule (40 mg total) by mouth daily.   90 capsule   3   .  INSULIN GLARGINE 100 UNIT/ML [place] SOLN   Subcutaneous   Inject 45 Units into the skin at bedtime.   10 mL   12   . INSULIN LISPRO (HUMAN) 100 UNIT/ML  SOLN   Subcutaneous   Inject 10 Units into the skin 3 (three) times daily before meals.   10 mL   12   . LISINOPRIL 20 MG PO TABS   Oral   Take 20 mg by mouth daily.         [name] SALINE 0.65 % NA SOLN   Nasal   Place 1 spray into the nose as needed for congestion.         . DISPOSABLE ENEMA 19-7 GM/118ML RE ENEM   Rectal   Place 1 enema rectally once. follow package directions   135  mL   0     BP 119/90  Pulse 97  Temp 98.1 F (36.7 C) (Oral)  Resp 16  Ht 6\' 1"  (1.854 m)  Wt 190 lb (86.183 kg)  BMI 25.07 kg/m2  SpO2 98%  Physical Exam  Nursing notes reviewed.  Electronic medical record reviewed. VITAL SIGNS:   Filed Vitals:   05/29/12 1900 05/29/12 2038 05/29/12 2145 05/29/12 2153  BP: 106/73 114/71 104/61   Pulse: 82 86 88   Temp:    98.7 F (37.1 C)  TempSrc:    Oral  Resp:  16    Height:      Weight:      SpO2: 98% 98% 98%    CONSTITUTIONAL: Awake, oriented, appears non-toxic HENT: Atraumatic, normocephalic, oral mucosa pink and moist, airway patent. Nares patent without drainage. External ears normal. EYES: Conjunctiva clear, EOMI, PERRLA NECK: Trachea midline, non-tender, supple CARDIOVASCULAR: Normal heart rate, Normal rhythm, No murmurs, rubs, gallops PULMONARY/CHEST: Clear to auscultation, no rhonchi, wheezes, or rales. Symmetrical breath sounds. Non-tender. ABDOMINAL: Non-distended, soft, mild tenderness to palpation in the left lower quadrant - no rebound or guarding.  BS normal. NEUROLOGIC: Non-focal, moving all four extremities, no gross sensory or motor deficits. EXTREMITIES: No clubbing, cyanosis, or edema SKIN: Warm, Dry, No erythema, No rash  ED Course  Procedures (including critical care time)  Labs Reviewed  GLUCOSE, CAPILLARY - Abnormal; Notable for the following:    Glucose-Capillary 554 (*)     All other components within normal limits   Dg Abd 2 Views  05/29/2012  *RADIOLOGY REPORT*  Clinical Data: Constipation, stones.  Left lower quadrant pain.  ABDOMEN - 2 VIEW  Comparison: 05/11/2012  Findings: Large stool burden throughout the colon, stable.  Small calcifications within the anatomic pelvis are stable since prior study and likely reflect phleboliths.  No evidence of bowel obstruction or free air.  No organomegaly.  Lung bases are clear.  IMPRESSION: Stable large stool burden throughout the colon.  No visible urinary  tract calculi.  Calcified phleboliths in the pelvis, stable.   Original Report Authenticated By: Charlett Nose, M.D.      1. Hyperglycemia   2. 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 Constipation       MDM  Val A Cleland is a 36 y.o. male presents due to to noncompliance medications, hyperglycemia and constipation. Patient's abdominal exam is benign and x-ray shows a very large quantity of stool throughout the entire colon. Patient is hyperglycemic however is not acidotic, it is slightly ketonemic. No indication of UTI.  Patient is Nontoxic, afebrile, vital signs been stable throughout his stay in the emergency department, and given him some insulin  here as well as fluids. He's feeling much better and has had a good result with a fleets enema.  We'll increase the patient's dose of MiraLax 2 3 times a day with twice a day dosing of Dulcolax and give him a prescription for fleets enema, given cautioned not to use these in succession. He may use saline enemas for more frequent needs.  Have written him prescriptions for his glipizide, and insulin. I given him further resources to follow up with resident clinic or another [facility] of his choice.  I explained the diagnosis and have given explicit precautions to return to the ER including worsening symptoms, diffuse abdominal pain, vomiting or any other new or worsening symptoms. The patient understands and accepts the medical plan as it's been dictated and I have answered their questions. Discharge instructions concerning home care and prescriptions have been given.  The patient is STABLE and is discharged to home in good condition.          Jones Skene, MD 05/30/12 0010

## 2012-05-29 NOTE — ED Notes (Signed)
Pt from home with multiple complaints. Sts that he was kicked out of [facility] [employer] due to missed appointments. Pt sts he has not had his regular dose of insulin in over 1 month- has been feeling numbness in his legs, feet and hands. Sts he has been urinating all the time, has not been able to defecate in over 3 weeks. Pt sts that he is also having some penile discharge and "skin peeling off". Also having lower back pain. Has tried enema at home for constipation without relief and naproxen at home for pain without relief.Pt alert, oriented and ambulatory in triage.

## 2012-05-29 NOTE — ED Notes (Signed)
Pt. C/o left sided flank pain he rates as a 6.  Pt also c/o numbness in his feet and legs.  Multiple other complaints.

## 2012-07-26 ENCOUNTER — Emergency Department (HOSPITAL_BASED_OUTPATIENT_CLINIC_OR_DEPARTMENT_OTHER)
Admission: EM | Admit: 2012-07-26 | Discharge: 2012-07-26 | Disposition: A | Discharge status: Home / Self Care | Payer: Self-pay | Attending: Emergency Medicine | Admitting: Emergency Medicine

## 2012-07-26 DIAGNOSIS — K219 Gastro-esophageal reflux disease without esophagitis: Secondary | ICD-10-CM | POA: Insufficient documentation

## 2012-07-26 DIAGNOSIS — Z7982 Long term (current) use of aspirin: Secondary | ICD-10-CM | POA: Insufficient documentation

## 2012-07-26 DIAGNOSIS — E119 Type 2 diabetes mellitus without complications: Secondary | ICD-10-CM | POA: Insufficient documentation

## 2012-07-26 DIAGNOSIS — R0982 Postnasal drip: Secondary | ICD-10-CM | POA: Insufficient documentation

## 2012-07-26 DIAGNOSIS — Z791 Long term (current) use of non-steroidal anti-inflammatories (NSAID): Secondary | ICD-10-CM | POA: Insufficient documentation

## 2012-07-26 DIAGNOSIS — Z794 Long term (current) use of insulin: Secondary | ICD-10-CM | POA: Insufficient documentation

## 2012-07-26 DIAGNOSIS — Z8679 Personal history of other diseases of the circulatory system: Secondary | ICD-10-CM | POA: Insufficient documentation

## 2012-07-26 DIAGNOSIS — I10 Essential (primary) hypertension: Secondary | ICD-10-CM | POA: Insufficient documentation

## 2012-07-26 DIAGNOSIS — Z79899 Other long term (current) drug therapy: Secondary | ICD-10-CM | POA: Insufficient documentation

## 2012-07-26 DIAGNOSIS — G4730 Sleep apnea, unspecified: Secondary | ICD-10-CM | POA: Insufficient documentation

## 2012-07-26 DIAGNOSIS — J45909 Unspecified asthma, uncomplicated: Secondary | ICD-10-CM | POA: Insufficient documentation

## 2012-07-26 DIAGNOSIS — M1990 Unspecified osteoarthritis, unspecified site: Secondary | ICD-10-CM | POA: Insufficient documentation

## 2012-07-26 DIAGNOSIS — F329 Major depressive disorder, single episode, unspecified: Secondary | ICD-10-CM | POA: Insufficient documentation

## 2012-07-26 DIAGNOSIS — F3289 Other specified depressive episodes: Secondary | ICD-10-CM | POA: Insufficient documentation

## 2012-07-26 DIAGNOSIS — Z87891 Personal history of nicotine dependence: Secondary | ICD-10-CM | POA: Insufficient documentation

## 2012-07-26 DIAGNOSIS — J069 Acute upper respiratory infection, unspecified: Secondary | ICD-10-CM | POA: Insufficient documentation

## 2012-07-26 DIAGNOSIS — E785 Hyperlipidemia, unspecified: Secondary | ICD-10-CM | POA: Insufficient documentation

## 2012-07-26 DIAGNOSIS — F309 Manic episode, unspecified: Secondary | ICD-10-CM | POA: Insufficient documentation

## 2012-07-26 DIAGNOSIS — M519 Unspecified thoracic, thoracolumbar and lumbosacral intervertebral disc disorder: Secondary | ICD-10-CM | POA: Insufficient documentation

## 2012-07-26 DIAGNOSIS — IMO0002 Reserved for concepts with insufficient information to code with codable children: Secondary | ICD-10-CM | POA: Insufficient documentation

## 2012-07-26 MED ORDER — OSELTAMIVIR PHOSPHATE 75 MG PO CAPS: 75.0000 mg | ORAL_CAPSULE | Freq: Two times a day (BID) | ORAL | Status: DC

## 2012-07-26 MED ORDER — FLUTICASONE PROPIONATE 50 MCG/ACT NA SUSP: 2.0000 | Freq: Every day | NASAL | Status: DC

## 2012-07-26 NOTE — ED Notes (Signed)
Pt c/o cold flu like symptoms with generalized body aches

## 2012-07-26 NOTE — ED Provider Notes (Signed)
History     CSN: [id number]  Arrival date & time 07/26/12  0126   First MD Initiated Contact with Patient 07/26/12 0150      Chief Complaint  Patient presents with  . URI  . Influenza    (Consider location/radiation/quality/duration/timing/severity/associated sxs/prior treatment) Patient is a 37 y.o. male presenting with URI. The history is provided by the patient. No language interpreter was used.  URI The primary symptoms include myalgias. Primary symptoms do not include fever, fatigue, ear pain, sore throat, swollen glands, wheezing, nausea, vomiting, arthralgias or rash. The current episode started 2 days ago. This is a new problem. The problem has not changed since onset. Myalgias began 2 days ago. The myalgias have been unchanged since their onset. The myalgias are generalized. The myalgias are dull. The discomfort from the myalgias is mild. The myalgias are not associated with weakness, tenderness or swelling.  The illness is not associated with chills, plugged ear sensation, facial pain, sinus pressure, congestion or rhinorrhea. The following treatments were addressed: NSAIDs were ineffective. Risk factors for severe complications from URI include diabetes mellitus.    Past Medical History  Diagnosis Date  . Depression   . Diabetes mellitus   . Hyperlipidemia   . HTN (hypertension)   . Chronic headaches   . Osteoarthritis   . Kidney stones   . Polysubstance abuse     cocaine, marijuana-quit summer 2010  . Tobacco abuse   . Asthma   . Chest pain   . Lumbar disc disease   . GERD (gastroesophageal reflux disease)   . OSA (obstructive sleep apnea)   . Peripheral neuropathy 02/01/2011  . Bipolar 1 disorder     Past Surgical History  Procedure Date  . Knee surgery   . Bunionectomy 06-2009    Family History  Problem Relation Age of Onset  . Heart disease Mother   . Diabetes Father   . Hyperlipidemia Father   . Emphysema Maternal Uncle   . Asthma Sister   .  Asthma Mother   . Stroke Mother   . Hypertension Father   . Alzheimer's disease Maternal Grandmother   . Depression Father   . Breast cancer Maternal Aunt   . COPD Mother     maternal un    History  Substance Use Topics  . Smoking status: Former Smoker -- 0.2 packs/day for 17 years    Types: Cigarettes    Quit date: 02/24/2011  . Smokeless tobacco: Former Neurosurgeon  . Alcohol Use: Yes     Comment: socially      Review of Systems  Constitutional: Negative for fever, chills and fatigue.  HENT: Negative for ear pain, congestion, sore throat, rhinorrhea and sinus pressure.   Respiratory: Negative for wheezing.   Gastrointestinal: Negative for nausea and vomiting.  Musculoskeletal: Positive for myalgias. Negative for arthralgias.  Skin: Negative for rash.  Neurological: Negative for weakness.  All other systems reviewed and are negative.    Allergies  Iodine; Lipitor; Nystatin; and Statins  Home Medications   Current Outpatient Rx  Name  Route  Sig  Dispense  Refill  . ALBUTEROL SULFATE HFA 108 (90 BASE) MCG/ACT IN AERS   Inhalation   Inhale 2 puffs into the lungs every 6 (six) hours as needed. For shortness of breath         . ALUM & MAG HYDROXIDE-SIMETH 200-200-20 MG/5ML PO SUSP   Oral   Take 15 mLs by mouth every 6 (six) hours as needed. For diarrhea         .  ASPIRIN EC 81 MG PO TBEC   Oral   Take 81 mg by mouth daily.          . BUDESONIDE-FORMOTEROL FUMARATE 80-4.5 MCG/ACT IN AERO   Inhalation   Inhale 2 puffs into the lungs 2 (two) times daily.         . BUPROPION HCL ER (SR) 100 MG PO TB12   Oral   Take 1 tablet (100 mg total) by mouth daily.   30 tablet   0   . BUPROPION HCL 100 MG PO TABS   Oral   Take 1 tablet (100 mg total) by mouth daily.   30 tablet   0   . CYCLOBENZAPRINE HCL 5 MG PO TABS   Oral   Take 5 mg by mouth 2 (two) times daily as needed. For muscle spasm         . FIRST-DUKES MOUTHWASH MT SUSP   Mouth/Throat   Use as  directed 10 mLs in the mouth or throat 4 (four) times daily as needed.   237 mL   0   . DOCUSATE SODIUM 100 MG PO CAPS   Oral   Take 1 capsule (100 mg total) by mouth every 12 (twelve) hours.   60 capsule   0   . ESOMEPRAZOLE MAGNESIUM 40 MG PO CPDR   Oral   Take 1 capsule (40 mg total) by mouth daily.   90 capsule   3   . GABAPENTIN 300 MG PO CAPS   Oral   Take 1 capsule (300 mg total) by mouth 3 (three) times daily.   90 capsule   0   . GLIPIZIDE ER 10 MG PO TB24      1 tab per day for blood sugar more than 200   30 tablet   2   . GLUCOSAMINE 500 MG PO TABS   Oral   Take 1 tablet by mouth daily.          . IBUPROFEN 800 MG PO TABS   Oral   Take 800 mg by mouth every 8 (eight) hours as needed. For pain         . INSULIN GLARGINE 100 UNIT/ML [place] SOLN   Subcutaneous   Inject 45 Units into the skin at bedtime.   10 mL   4   . INSULIN LISPRO (HUMAN) 100 UNIT/ML [place] SOLN   Subcutaneous   Inject 10 Units into the skin 3 (three) times daily before meals.   10 mL   4   . LISINOPRIL 20 MG PO TABS   Oral   Take 20 mg by mouth daily.         [name] LORATADINE 10 MG PO TABS   Oral   Take 1 tablet (10 mg total) by mouth daily.         [name] MICONAZOLE NITRATE 2 % EX CREA   Topical   Apply topically 2 (two) times daily.   28.35 g   0   . MULTIVITAMINS PO CAPS   Oral   Take 1 capsule by mouth daily.           [name] POLYETHYLENE GLYCOL 3350 PO POWD   Oral   Take 17 g by mouth daily.   255 g   0   . POLYETHYLENE GLYCOL 3350 PO POWD   Oral   Take 17 g by mouth 3 (three) times daily.   255 g   0   . SERTRALINE HCL 100 MG  PO TABS   Oral   Take 2 tablets (200 mg total) by mouth daily.   60 tablet   0   . SALINE 0.65 % NA SOLN   Nasal   Place 1 spray into the nose as needed for congestion.         . DISPOSABLE ENEMA 19-7 GM/118ML RE ENEM   Rectal   Place 3 enemas rectally once. follow package directions   135 mL   0   . TETRAHYDROZOLINE HCL 0.05 %  OP SOLN   Both Eyes   Place 2 drops into both eyes 2 (two) times daily. For dry eyes         . TRAZODONE HCL 100 MG PO TABS   Oral   Take 100 mg by mouth at bedtime.           BP 105/75  Pulse 108  Temp 98.8 F (37.1 C) (Oral)  Resp 18  SpO2 98%  Physical Exam  Constitutional: He is oriented to person, place, and time. He appears well-developed and well-nourished. No distress.  HENT:  Head: Normocephalic and atraumatic.  Mouth/Throat: Oropharynx is clear and moist. No oropharyngeal exudate.       Clear colorless drainage cw PND  Eyes: Conjunctivae normal and EOM are normal. Pupils are equal, round, and reactive to light.  Cardiovascular: Normal rate, regular rhythm and intact distal pulses.   Pulmonary/Chest: Effort normal and breath sounds normal. No stridor. He has no wheezes. He has no rales.  Abdominal: Soft. Bowel sounds are normal. There is no tenderness. There is no rebound and no guarding.  Musculoskeletal: Normal range of motion. He exhibits no tenderness.  Lymphadenopathy:    He has no cervical adenopathy.  Neurological: He is alert and oriented to person, place, and time. He has normal reflexes.  Skin: Skin is warm and dry.  Psychiatric: He has a normal mood and affect.    ED Course  Procedures (including critical care time)  Labs Reviewed - No data to display No results found.   No diagnosis found.    MDM  According to lab flu test can only be sent if patient being admitted will treat       Myha Arizpe K Shanna Un-Rasch, MD 07/26/12 [phone]

## 2013-02-27 ENCOUNTER — Emergency Department (HOSPITAL_BASED_OUTPATIENT_CLINIC_OR_DEPARTMENT_OTHER)
Admission: EM | Admit: 2013-02-27 | Discharge: 2013-02-27 | Disposition: A | Discharge status: Home / Self Care | Payer: Self-pay | Attending: Emergency Medicine | Admitting: Emergency Medicine

## 2013-02-27 ENCOUNTER — Encounter (HOSPITAL_BASED_OUTPATIENT_CLINIC_OR_DEPARTMENT_OTHER): Payer: Self-pay | Admitting: *Deleted

## 2013-02-27 DIAGNOSIS — Z8669 Personal history of other diseases of the nervous system and sense organs: Secondary | ICD-10-CM | POA: Insufficient documentation

## 2013-02-27 DIAGNOSIS — M542 Cervicalgia: Secondary | ICD-10-CM | POA: Insufficient documentation

## 2013-02-27 DIAGNOSIS — Z794 Long term (current) use of insulin: Secondary | ICD-10-CM | POA: Insufficient documentation

## 2013-02-27 DIAGNOSIS — F319 Bipolar disorder, unspecified: Secondary | ICD-10-CM | POA: Insufficient documentation

## 2013-02-27 DIAGNOSIS — I10 Essential (primary) hypertension: Secondary | ICD-10-CM | POA: Insufficient documentation

## 2013-02-27 DIAGNOSIS — Z79899 Other long term (current) drug therapy: Secondary | ICD-10-CM | POA: Insufficient documentation

## 2013-02-27 DIAGNOSIS — Z862 Personal history of diseases of the blood and blood-forming organs and certain disorders involving the immune mechanism: Secondary | ICD-10-CM | POA: Insufficient documentation

## 2013-02-27 DIAGNOSIS — Z8739 Personal history of other diseases of the musculoskeletal system and connective tissue: Secondary | ICD-10-CM | POA: Insufficient documentation

## 2013-02-27 DIAGNOSIS — G8929 Other chronic pain: Secondary | ICD-10-CM | POA: Insufficient documentation

## 2013-02-27 DIAGNOSIS — Z87891 Personal history of nicotine dependence: Secondary | ICD-10-CM | POA: Insufficient documentation

## 2013-02-27 DIAGNOSIS — Z87442 Personal history of urinary calculi: Secondary | ICD-10-CM | POA: Insufficient documentation

## 2013-02-27 DIAGNOSIS — E119 Type 2 diabetes mellitus without complications: Secondary | ICD-10-CM | POA: Insufficient documentation

## 2013-02-27 DIAGNOSIS — F3289 Other specified depressive episodes: Secondary | ICD-10-CM | POA: Insufficient documentation

## 2013-02-27 DIAGNOSIS — M129 Arthropathy, unspecified: Secondary | ICD-10-CM | POA: Insufficient documentation

## 2013-02-27 DIAGNOSIS — Z8719 Personal history of other diseases of the digestive system: Secondary | ICD-10-CM | POA: Insufficient documentation

## 2013-02-27 DIAGNOSIS — Z7982 Long term (current) use of aspirin: Secondary | ICD-10-CM | POA: Insufficient documentation

## 2013-02-27 DIAGNOSIS — F329 Major depressive disorder, single episode, unspecified: Secondary | ICD-10-CM | POA: Insufficient documentation

## 2013-02-27 DIAGNOSIS — J45909 Unspecified asthma, uncomplicated: Secondary | ICD-10-CM | POA: Insufficient documentation

## 2013-02-27 DIAGNOSIS — Z8639 Personal history of other endocrine, nutritional and metabolic disease: Secondary | ICD-10-CM | POA: Insufficient documentation

## 2013-02-27 LAB — URINALYSIS, ROUTINE W REFLEX MICROSCOPIC
Glucose, UA: >1000 mg/dL — AB
Leukocytes, UA: NEGATIVE
Specific Gravity, Urine: 1.04 — ABNORMAL HIGH (ref 1.005–1.030)
pH: 6.5 (ref 5.0–8.0)

## 2013-02-27 LAB — BASIC METABOLIC PANEL
BUN: 19 mg/dL (ref 6–23)
Calcium: 11 mg/dL — ABNORMAL HIGH (ref 8.4–10.5)
Creatinine, Ser: 0.9 mg/dL (ref 0.50–1.35)
GFR calc non Af Amer: >90 mL/min (ref >90)
Glucose, Bld: 83 mg/dL (ref 70–99)
Sodium: 146 mEq/L — ABNORMAL HIGH (ref 135–145)

## 2013-02-27 LAB — CBC
Hemoglobin: 14.9 g/dL (ref 13.0–17.0)
MCH: 32 pg (ref 26.0–34.0)
MCHC: 35.9 g/dL (ref 30.0–36.0)
MCV: 89.1 fL (ref 78.0–100.0)

## 2013-02-27 LAB — RAPID URINE DRUG SCREEN, HOSP PERFORMED
Cocaine: NOT DETECTED
Opiates: NOT DETECTED
Tetrahydrocannabinol: NOT DETECTED

## 2013-02-27 MED ORDER — IBUPROFEN 400 MG PO TABS
600.0000 mg | ORAL_TABLET | Freq: Once | ORAL | Status: AC
  Administered 2013-02-27: 600 mg via ORAL
  Filled 2013-02-27: qty 1

## 2013-02-27 MED ORDER — IBUPROFEN 600 MG PO TABS: 600.0000 mg | ORAL_TABLET | Freq: Three times a day (TID) | ORAL | Status: DC | PRN

## 2013-02-27 MED ORDER — OXYCODONE-ACETAMINOPHEN 5-325 MG PO TABS
1.0000 | ORAL_TABLET | Freq: Once | ORAL | Status: AC
  Administered 2013-02-27: 1 via ORAL
  Filled 2013-02-27 (×2): qty 1

## 2013-02-27 MED ORDER — HYDROCODONE-ACETAMINOPHEN 5-325 MG PO TABS: 1.0000 | ORAL_TABLET | ORAL | Status: DC | PRN

## 2013-02-27 MED ORDER — SODIUM CHLORIDE 0.9 % IV SOLN
1000.0000 mL | INTRAVENOUS | Status: DC
  Administered 2013-02-27: 1000 mL via INTRAVENOUS

## 2013-02-27 MED ORDER — SODIUM CHLORIDE 0.9 % IV SOLN
1000.0000 mL | Freq: Once | INTRAVENOUS | Status: AC
  Administered 2013-02-27: 1000 mL via INTRAVENOUS

## 2013-02-27 NOTE — ED Notes (Signed)
Back and neck pain   X 2 hrs  States has this when glucose is high.

## 2013-02-27 NOTE — ED Provider Notes (Signed)
CSN: [id number]     Arrival date & time 02/27/13  1840 History     First MD Initiated Contact with Patient 02/27/13 1842     Chief Complaint  Patient presents with  . Back Pain   (Consider location/radiation/quality/duration/timing/severity/associated sxs/prior Treatment) The history is provided by the patient and a relative.   patient ports in his blood sugar is high earlier today and it sounds like he was in the 400 range.  His family member came over and helped him use his NovoLog.  His blood sugar on arrival to the emergency apartment is in the 150s.  He states that he laid down after he got the insulin when he woke up he was hurting in his neck.  Reports his pain is worse with range of motion.  Denies fevers or chills.  No nausea or vomiting.  No diarrhea.  No chest pain shortness of breath.  No abdominal pain.  No low back pain.  No recent fall or trauma.  He states is intermittently had pain like this in his neck before without a clear etiology ever determined as the cause.  He states his pain is intermittent.  Currently his pain is moderate in severity.  He currently is without a primary care physician.  Past Medical History  Diagnosis Date  . Depression   . Diabetes mellitus   . Hyperlipidemia   . HTN (hypertension)   . Chronic headaches   . Osteoarthritis   . Kidney stones   . Polysubstance abuse     cocaine, marijuana-quit summer 2010  . Tobacco abuse   . Asthma   . Chest pain   . Lumbar disc disease   . GERD (gastroesophageal reflux disease)   . OSA (obstructive sleep apnea)   . Peripheral neuropathy 02/01/2011  . Bipolar 1 disorder    Past Surgical History  Procedure Laterality Date  . Knee surgery    . Bunionectomy  06-2009   Family History  Problem Relation Age of Onset  . Heart disease Mother   . Diabetes Father   . Hyperlipidemia Father   . Emphysema Maternal Uncle   . Asthma Sister   . Asthma Mother   . Stroke Mother   . Hypertension Father   .  Alzheimer's disease Maternal Grandmother   . Depression Father   . Breast cancer Maternal Aunt   . COPD Mother     maternal un   History  Substance Use Topics  . Smoking status: Former Smoker -- 0.20 packs/day for 17 years    Types: Cigarettes    Quit date: 02/24/2011  . Smokeless tobacco: Former Neurosurgeon  . Alcohol Use: Yes     Comment: socially    Review of Systems  Musculoskeletal: Positive for back pain.  All other systems reviewed and are negative.    Allergies  Iodine; Lipitor; Nystatin; and Statins  Home Medications   Current Outpatient Rx  Name  Route  Sig  Dispense  Refill  . albuterol (PROVENTIL HFA;VENTOLIN HFA) 108 (90 BASE) MCG/ACT inhaler   Inhalation   Inhale 2 puffs into the lungs every 6 (six) hours as needed. For shortness of breath         . alum & mag hydroxide-simeth (MAALOX/MYLANTA) 200-200-20 MG/5ML suspension   Oral   Take 15 mLs by mouth every 6 (six) hours as needed. For diarrhea         . aspirin EC 81 MG tablet   Oral   Take 81 mg by  mouth daily.          . budesonide-formoterol (SYMBICORT) 80-4.5 MCG/ACT inhaler   Inhalation   Inhale 2 puffs into the lungs 2 (two) times daily.         [name] EXPIRED: buPROPion (WELLBUTRIN SR) 100 MG 12 hr tablet   Oral   Take 1 tablet (100 mg total) by mouth daily.   30 tablet   0   . EXPIRED: buPROPion (WELLBUTRIN) 100 MG tablet   Oral   Take 1 tablet (100 mg total) by mouth daily.   30 tablet   0   . cyclobenzaprine (FLEXERIL) 5 MG tablet   Oral   Take 5 mg by mouth 2 (two) times daily as needed. For muscle spasm         . Diphenhyd-Hydrocort-Nystatin (FIRST-DUKES MOUTHWASH) SUSP   Mouth/Throat   Use as directed 10 mLs in the mouth or throat 4 (four) times daily as needed.   237 mL   0   . docusate sodium (COLACE) 100 MG capsule   Oral   Take 1 capsule (100 mg total) by mouth every 12 (twelve) hours.   60 capsule   0   . fluticasone (FLONASE) 50 MCG/ACT nasal spray   Nasal    Place 2 sprays into the nose daily.   16 g   0   . EXPIRED: gabapentin (NEURONTIN) 300 MG capsule   Oral   Take 1 capsule (300 mg total) by mouth 3 (three) times daily.   90 capsule   0   . glipiZIDE (GLUCOTROL XL) 10 MG 24 hr tablet      1 tab per day for blood sugar more than 200   30 tablet   2   . Glucosamine 500 MG TABS   Oral   Take 1 tablet by mouth daily.          [name] HYDROcodone-acetaminophen (NORCO/VICODIN) 5-325 MG per tablet   Oral   Take 1 tablet by mouth every 4 (four) hours as needed for pain.   8 tablet   0   . ibuprofen (ADVIL,MOTRIN) 600 MG tablet   Oral   Take 1 tablet (600 mg total) by mouth every 8 (eight) hours as needed for pain.   15 tablet   0   . ibuprofen (ADVIL,MOTRIN) 800 MG tablet   Oral   Take 800 mg by mouth every 8 (eight) hours as needed. For pain         . insulin glargine (LANTUS) 100 UNIT/ML injection   Subcutaneous   Inject 45 Units into the skin at bedtime.   10 mL   4   . insulin lispro (HUMALOG) 100 UNIT/ML injection   Subcutaneous   Inject 10 Units into the skin 3 (three) times daily before meals.   10 mL   4   . EXPIRED: lisinopril (PRINIVIL,ZESTRIL) 20 MG tablet   Oral   Take 20 mg by mouth daily.         [name] EXPIRED: loratadine (CLARITIN) 10 MG tablet   Oral   Take 1 tablet (10 mg total) by mouth daily.         . Multiple Vitamin (MULTIVITAMIN) capsule   Oral   Take 1 capsule by mouth daily.           [name] oseltamivir (TAMIFLU) 75 MG capsule   Oral   Take 1 capsule (75 mg total) by mouth every 12 (twelve) hours.   10 capsule   0   .  polyethylene glycol powder (GLYCOLAX) powder   Oral   Take 17 g by mouth daily.   255 g   0   . polyethylene glycol powder (GLYCOLAX) powder   Oral   Take 17 g by mouth 3 (three) times daily.   255 g   0   . EXPIRED: sertraline (ZOLOFT) 100 MG tablet   Oral   Take 2 tablets (200 mg total) by mouth daily.   60 tablet   0   . sodium chloride (OCEAN) 0.65 %  SOLN nasal spray   Nasal   Place 1 spray into the nose as needed for congestion.         . sodium phosphate (FLEET) enema   Rectal   Place 3 enemas rectally once. follow package directions   135 mL   0    BP 123/80  Pulse 79  Temp(Src) 98.5 F (36.9 C) (Oral)  Resp 16  Ht 6\' 1"  (1.854 m)  Wt 170 lb (77.111 kg)  BMI 22.43 kg/m2  SpO2 99% Physical Exam  Nursing note and vitals reviewed. Constitutional: He is oriented to person, place, and time. He appears well-developed and well-nourished.  HENT:  Head: Normocephalic and atraumatic.  Eyes: EOM are normal.  Neck: Normal range of motion. Neck supple. No tracheal deviation present. No thyromegaly present.  No cervical or paracervical tenderness.  No anterior neck lymphadenopathy.  Cardiovascular: Normal rate, regular rhythm, normal heart sounds and intact distal pulses.   Pulmonary/Chest: Effort normal and breath sounds normal. No stridor. No respiratory distress.  Abdominal: Soft. He exhibits no distension. There is no tenderness.  Genitourinary: Rectum normal.  Musculoskeletal: Normal range of motion.  No thoracic or lumbar tenderness.  Lymphadenopathy:    He has no cervical adenopathy.  Neurological: He is alert and oriented to person, place, and time.  Skin: Skin is warm and dry.  Psychiatric: He has a normal mood and affect. Judgment normal.    ED Course   Procedures (including critical care time)  Labs Reviewed  GLUCOSE, CAPILLARY - Abnormal; Notable for the following:    Glucose-Capillary 155 (*)    All other components within normal limits  URINALYSIS, ROUTINE W REFLEX MICROSCOPIC - Abnormal; Notable for the following:    Specific Gravity, Urine 1.040 (*)    Glucose, UA >1000 (*)    Ketones, ur 15 (*)    All other components within normal limits  BASIC METABOLIC PANEL - Abnormal; Notable for the following:    Sodium 146 (*)    Calcium 11.0 (*)    All other components within normal limits  URINE RAPID  DRUG SCREEN (HOSP PERFORMED)  CBC  URINE MICROSCOPIC-ADD ON   No results found. 1. Neck pain     MDM  8:21 PM Patient feels much better after pain medicine.  Labs without significant abnormality.  Hydrated in the emergency department as his likely had some hyperglycemia for several days as he does report that he had some urinary frequency.  No signs of urinary tract infection.  Urine drug screen is negative.  Discharge home in good condition.  History working on developing a relationship with a new primary care physician.  He continues to have his diabetic meds at home.  Family seems supportive.  Discharge home in good condition.  Lyanne Co, MD 02/27/13 2021

## 2013-03-26 ENCOUNTER — Emergency Department (HOSPITAL_BASED_OUTPATIENT_CLINIC_OR_DEPARTMENT_OTHER)
Admission: EM | Admit: 2013-03-26 | Discharge: 2013-03-26 | Disposition: A | Discharge status: Home / Self Care | Payer: Self-pay | Attending: Emergency Medicine | Admitting: Emergency Medicine

## 2013-03-26 ENCOUNTER — Encounter (HOSPITAL_BASED_OUTPATIENT_CLINIC_OR_DEPARTMENT_OTHER): Payer: Self-pay | Admitting: *Deleted

## 2013-03-26 DIAGNOSIS — Z8679 Personal history of other diseases of the circulatory system: Secondary | ICD-10-CM | POA: Insufficient documentation

## 2013-03-26 DIAGNOSIS — F319 Bipolar disorder, unspecified: Secondary | ICD-10-CM | POA: Insufficient documentation

## 2013-03-26 DIAGNOSIS — G8929 Other chronic pain: Secondary | ICD-10-CM

## 2013-03-26 DIAGNOSIS — Z7982 Long term (current) use of aspirin: Secondary | ICD-10-CM | POA: Insufficient documentation

## 2013-03-26 DIAGNOSIS — W230XXA Caught, crushed, jammed, or pinched between moving objects, initial encounter: Secondary | ICD-10-CM | POA: Insufficient documentation

## 2013-03-26 DIAGNOSIS — M545 Low back pain, unspecified: Secondary | ICD-10-CM | POA: Insufficient documentation

## 2013-03-26 DIAGNOSIS — R739 Hyperglycemia, unspecified: Secondary | ICD-10-CM

## 2013-03-26 DIAGNOSIS — Z87891 Personal history of nicotine dependence: Secondary | ICD-10-CM | POA: Insufficient documentation

## 2013-03-26 DIAGNOSIS — I10 Essential (primary) hypertension: Secondary | ICD-10-CM | POA: Insufficient documentation

## 2013-03-26 DIAGNOSIS — Y9389 Activity, other specified: Secondary | ICD-10-CM | POA: Insufficient documentation

## 2013-03-26 DIAGNOSIS — E785 Hyperlipidemia, unspecified: Secondary | ICD-10-CM | POA: Insufficient documentation

## 2013-03-26 DIAGNOSIS — E119 Type 2 diabetes mellitus without complications: Secondary | ICD-10-CM | POA: Insufficient documentation

## 2013-03-26 DIAGNOSIS — Z8719 Personal history of other diseases of the digestive system: Secondary | ICD-10-CM | POA: Insufficient documentation

## 2013-03-26 DIAGNOSIS — Z87442 Personal history of urinary calculi: Secondary | ICD-10-CM | POA: Insufficient documentation

## 2013-03-26 DIAGNOSIS — M1990 Unspecified osteoarthritis, unspecified site: Secondary | ICD-10-CM | POA: Insufficient documentation

## 2013-03-26 DIAGNOSIS — S8012XA Contusion of left lower leg, initial encounter: Secondary | ICD-10-CM

## 2013-03-26 DIAGNOSIS — J45909 Unspecified asthma, uncomplicated: Secondary | ICD-10-CM | POA: Insufficient documentation

## 2013-03-26 DIAGNOSIS — Z794 Long term (current) use of insulin: Secondary | ICD-10-CM | POA: Insufficient documentation

## 2013-03-26 DIAGNOSIS — Z8669 Personal history of other diseases of the nervous system and sense organs: Secondary | ICD-10-CM | POA: Insufficient documentation

## 2013-03-26 DIAGNOSIS — Z79899 Other long term (current) drug therapy: Secondary | ICD-10-CM | POA: Insufficient documentation

## 2013-03-26 DIAGNOSIS — Y92009 Unspecified place in unspecified non-institutional (private) residence as the place of occurrence of the external cause: Secondary | ICD-10-CM | POA: Insufficient documentation

## 2013-03-26 DIAGNOSIS — S8010XA Contusion of unspecified lower leg, initial encounter: Secondary | ICD-10-CM | POA: Insufficient documentation

## 2013-03-26 MED ORDER — EZETIMIBE 10 MG PO TABS: 10.0000 mg | ORAL_TABLET | Freq: Every day | ORAL | Status: DC

## 2013-03-26 MED ORDER — NAPROXEN 500 MG PO TABS: 500.0000 mg | ORAL_TABLET | Freq: Two times a day (BID) | ORAL | Status: DC

## 2013-03-26 MED ORDER — HYDROCHLOROTHIAZIDE 25 MG PO TABS: 25.0000 mg | ORAL_TABLET | Freq: Every day | ORAL | Status: DC

## 2013-03-26 NOTE — ED Notes (Signed)
Left leg pain x 4 days since lawn mower ran over his foot causing his leg to twist. Separate complaint of lower back pain for a month. 3rd complaint of neck pain unrelated to lawn mower injury. 4th complaint his blood sugar is out of control per patient.

## 2013-03-26 NOTE — ED Notes (Signed)
Pt reports that he was mowing yard on Thursday and left leg got caught under wheelwell and twisted knee inward, pt reports pain to left medial ankle and left medial knee, pt reports that pain has increased despite rest and ice, he did take a percocet last evening and pain was controlled enough to sleep.

## 2013-03-26 NOTE — ED Provider Notes (Signed)
CSN: [id number]     Arrival date & time 03/26/13  4098 History  This chart was scribed for Wayne Roller, MD by Leone Payor, ED Scribe. This patient was seen in room MH06/MH06 and the patient's care was started 7:19 PM.    Chief Complaint  Patient presents with  . Leg Pain    HPI  HPI Comments: Wayne [name] is a 37 y.o. male who presents to the Emergency Department complaining of constant left leg pain that began 4 days ago. States the pain began after a lawn mower ran over his left foot causing external rotation of left knee and ankle. He was able to walk after the injury and had onset of pain later on. He reports taking OTC anti-inflammatories.   Pt states he had sudden onset, constant, unchanged neck pain that began today. He also complains of coccyx pain that radiates up to the low back that began 1 month ago. He denies any recent falls or trauma to the neck or back.  Pt has history of DM, HTN, neuropathy,    Past Medical History  Diagnosis Date  . Depression   . Diabetes mellitus   . Hyperlipidemia   . HTN (hypertension)   . Chronic headaches   . Osteoarthritis   . Kidney stones   . Polysubstance abuse     cocaine, marijuana-quit summer 2010  . Tobacco abuse   . Asthma   . Chest pain   . Lumbar disc disease   . GERD (gastroesophageal reflux disease)   . OSA (obstructive sleep apnea)   . Peripheral neuropathy 02/01/2011  . Bipolar 1 disorder    Past Surgical History  Procedure Laterality Date  . Knee surgery    . Bunionectomy  06-2009   Family History  Problem Relation Age of Onset  . Heart disease Mother   . Diabetes Father   . Hyperlipidemia Father   . Emphysema Maternal Uncle   . Asthma Sister   . Asthma Mother   . Stroke Mother   . Hypertension Father   . Alzheimer's disease Maternal Grandmother   . Depression Father   . Breast cancer Maternal Aunt   . COPD Mother     maternal un   History  Substance Use Topics  . Smoking status: Former Smoker -- 0.20  packs/day for 17 years    Types: Cigarettes    Quit date: 02/24/2011  . Smokeless tobacco: Former Neurosurgeon  . Alcohol Use: Yes     Comment: socially    Review of Systems A complete 10 system review of systems was obtained and all systems are negative except as noted in the HPI and PMH.   Allergies  Iodine; Lipitor; Nystatin; and Statins  Home Medications   Current Outpatient Rx  Name  Route  Sig  Dispense  Refill  . albuterol (PROVENTIL HFA;VENTOLIN HFA) 108 (90 BASE) MCG/ACT inhaler   Inhalation   Inhale 2 puffs into the lungs every 6 (six) hours as needed. For shortness of breath         . alum & mag hydroxide-simeth (MAALOX/MYLANTA) 200-200-20 MG/5ML suspension   Oral   Take 15 mLs by mouth every 6 (six) hours as needed. For diarrhea         . aspirin EC 81 MG tablet   Oral   Take 81 mg by mouth daily.          . budesonide-formoterol (SYMBICORT) 80-4.5 MCG/ACT inhaler   Inhalation   Inhale 2 puffs  into the lungs 2 (two) times daily.         [name] EXPIRED: buPROPion (WELLBUTRIN SR) 100 MG 12 hr tablet   Oral   Take 1 tablet (100 mg total) by mouth daily.   30 tablet   0   . EXPIRED: buPROPion (WELLBUTRIN) 100 MG tablet   Oral   Take 1 tablet (100 mg total) by mouth daily.   30 tablet   0   . cyclobenzaprine (FLEXERIL) 5 MG tablet   Oral   Take 5 mg by mouth 2 (two) times daily as needed. For muscle spasm         . Diphenhyd-Hydrocort-Nystatin (FIRST-DUKES MOUTHWASH) SUSP   Mouth/Throat   Use as directed 10 mLs in the mouth or throat 4 (four) times daily as needed.   237 mL   0   . docusate sodium (COLACE) 100 MG capsule   Oral   Take 1 capsule (100 mg total) by mouth every 12 (twelve) hours.   60 capsule   0   . ezetimibe (ZETIA) 10 MG tablet   Oral   Take 1 tablet (10 mg total) by mouth daily.   30 tablet   1   . fluticasone (FLONASE) 50 MCG/ACT nasal spray   Nasal   Place 2 sprays into the nose daily.   16 g   0   . EXPIRED: gabapentin  (NEURONTIN) 300 MG capsule   Oral   Take 1 capsule (300 mg total) by mouth 3 (three) times daily.   90 capsule   0   . glipiZIDE (GLUCOTROL XL) 10 MG 24 hr tablet      1 tab per day for blood sugar more than 200   30 tablet   2   . Glucosamine 500 MG TABS   Oral   Take 1 tablet by mouth daily.          . hydrochlorothiazide (HYDRODIURIL) 25 MG tablet   Oral   Take 1 tablet (25 mg total) by mouth daily.   30 tablet   1   . HYDROcodone-acetaminophen (NORCO/VICODIN) 5-325 MG per tablet   Oral   Take 1 tablet by mouth every 4 (four) hours as needed for pain.   8 tablet   0   . ibuprofen (ADVIL,MOTRIN) 600 MG tablet   Oral   Take 1 tablet (600 mg total) by mouth every 8 (eight) hours as needed for pain.   15 tablet   0   . ibuprofen (ADVIL,MOTRIN) 800 MG tablet   Oral   Take 800 mg by mouth every 8 (eight) hours as needed. For pain         . insulin glargine (LANTUS) 100 UNIT/ML injection   Subcutaneous   Inject 45 Units into the skin at bedtime.   10 mL   4   . insulin lispro (HUMALOG) 100 UNIT/ML injection   Subcutaneous   Inject 10 Units into the skin 3 (three) times daily before meals.   10 mL   4   . EXPIRED: lisinopril (PRINIVIL,ZESTRIL) 20 MG tablet   Oral   Take 20 mg by mouth daily.         [name] EXPIRED: loratadine (CLARITIN) 10 MG tablet   Oral   Take 1 tablet (10 mg total) by mouth daily.         . Multiple Vitamin (MULTIVITAMIN) capsule   Oral   Take 1 capsule by mouth daily.           [name]  naproxen (NAPROSYN) 500 MG tablet   Oral   Take 1 tablet (500 mg total) by mouth 2 (two) times daily with a meal.   30 tablet   0   . oseltamivir (TAMIFLU) 75 MG capsule   Oral   Take 1 capsule (75 mg total) by mouth every 12 (twelve) hours.   10 capsule   0   . polyethylene glycol powder (GLYCOLAX) powder   Oral   Take 17 g by mouth daily.   255 g   0   . polyethylene glycol powder (GLYCOLAX) powder   Oral   Take 17 g by mouth 3 (three)  times daily.   255 g   0   . EXPIRED: sertraline (ZOLOFT) 100 MG tablet   Oral   Take 2 tablets (200 mg total) by mouth daily.   60 tablet   0   . sodium chloride (OCEAN) 0.65 % SOLN nasal spray   Nasal   Place 1 spray into the nose as needed for congestion.         . sodium phosphate (FLEET) enema   Rectal   Place 3 enemas rectally once. follow package directions   135 mL   0    BP 131/94  Pulse 95  Temp(Src) 99 F (37.2 C) (Oral)  Resp 20  Ht 6\' 1"  (1.854 m)  Wt 165 lb (74.844 kg)  BMI 21.77 kg/m2  SpO2 99% Physical Exam  Nursing note and vitals reviewed. Constitutional: He is oriented to person, place, and time. He appears well-developed and well-nourished. No distress.  HENT:  Head: Normocephalic and atraumatic.  Mouth/Throat: Oropharynx is clear and moist.  Eyes: Conjunctivae are normal. Pupils are equal, round, and reactive to light. No scleral icterus.  Neck: Neck supple.  Cardiovascular: Normal rate, regular rhythm, normal heart sounds and intact distal pulses.   No murmur heard. Pulmonary/Chest: Effort normal and breath sounds normal. No stridor. No respiratory distress. He has no wheezes. He has no rales.  Abdominal: Soft. He exhibits no distension. There is no tenderness.  Musculoskeletal: Normal range of motion. He exhibits no edema.  Right paraspinal tenderness.   Neurological: He is alert and oriented to person, place, and time.  Skin: Skin is warm and dry. No rash noted.  Psychiatric: He has a normal mood and affect. His behavior is normal.    ED Course  Procedures (including critical care time)  DIAGNOSTIC STUDIES: Oxygen Saturation is 99% on RA, normal by my interpretation.    COORDINATION OF CARE: 7:28 PM Discussed treatment plan with pt at bedside and pt agreed to plan.   Labs Review Labs Reviewed  GLUCOSE, CAPILLARY - Abnormal; Notable for the following:    Glucose-Capillary 362 (*)    All other components within normal limits    Imaging Review No results found.   MDM   1. Contusion of left leg, initial encounter   2. Hyperglycemia   3. Chronic back pain    Pt has no acute findings to suggest fractures - no swelling, no redness, normal NV exam, VS WNL accepting for no anti hypertensive meds recnetly - states he has had no dietary control today eating high sugar foods all day - this explains CBG - no tachycarida - doubt any more sig illnesses - he has insulin at home.  Meds given in ED:  Medications - No data to display  New Prescriptions   EZETIMIBE (ZETIA) 10 MG TABLET    Take 1 tablet (10 mg total)  by mouth daily.   HYDROCHLOROTHIAZIDE (HYDRODIURIL) 25 MG TABLET    Take 1 tablet (25 mg total) by mouth daily.   NAPROXEN (NAPROSYN) 500 MG TABLET    Take 1 tablet (500 mg total) by mouth 2 (two) times daily with a meal.   I personally performed the services described in this documentation, which was scribed in my presence. The recorded information has been reviewed and is accurate.      Wayne Roller, MD 03/26/13 [phone]

## 2013-05-18 DIAGNOSIS — G8929 Other chronic pain: Secondary | ICD-10-CM | POA: Insufficient documentation

## 2013-06-18 ENCOUNTER — Emergency Department (HOSPITAL_BASED_OUTPATIENT_CLINIC_OR_DEPARTMENT_OTHER): Payer: Self-pay

## 2013-06-18 ENCOUNTER — Emergency Department (HOSPITAL_BASED_OUTPATIENT_CLINIC_OR_DEPARTMENT_OTHER)
Admission: EM | Admit: 2013-06-18 | Discharge: 2013-06-18 | Disposition: A | Discharge status: Home / Self Care | Payer: Self-pay | Attending: Emergency Medicine | Admitting: Emergency Medicine

## 2013-06-18 ENCOUNTER — Encounter (HOSPITAL_BASED_OUTPATIENT_CLINIC_OR_DEPARTMENT_OTHER): Payer: Self-pay | Admitting: Emergency Medicine

## 2013-06-18 DIAGNOSIS — IMO0002 Reserved for concepts with insufficient information to code with codable children: Secondary | ICD-10-CM | POA: Insufficient documentation

## 2013-06-18 DIAGNOSIS — E1149 Type 2 diabetes mellitus with other diabetic neurological complication: Secondary | ICD-10-CM | POA: Insufficient documentation

## 2013-06-18 DIAGNOSIS — E785 Hyperlipidemia, unspecified: Secondary | ICD-10-CM | POA: Insufficient documentation

## 2013-06-18 DIAGNOSIS — F319 Bipolar disorder, unspecified: Secondary | ICD-10-CM | POA: Insufficient documentation

## 2013-06-18 DIAGNOSIS — G589 Mononeuropathy, unspecified: Secondary | ICD-10-CM | POA: Insufficient documentation

## 2013-06-18 DIAGNOSIS — L84 Corns and callosities: Secondary | ICD-10-CM | POA: Insufficient documentation

## 2013-06-18 DIAGNOSIS — G8929 Other chronic pain: Secondary | ICD-10-CM | POA: Insufficient documentation

## 2013-06-18 DIAGNOSIS — E119 Type 2 diabetes mellitus without complications: Secondary | ICD-10-CM

## 2013-06-18 DIAGNOSIS — Z8719 Personal history of other diseases of the digestive system: Secondary | ICD-10-CM | POA: Insufficient documentation

## 2013-06-18 DIAGNOSIS — I10 Essential (primary) hypertension: Secondary | ICD-10-CM | POA: Insufficient documentation

## 2013-06-18 DIAGNOSIS — R112 Nausea with vomiting, unspecified: Secondary | ICD-10-CM | POA: Insufficient documentation

## 2013-06-18 DIAGNOSIS — R61 Generalized hyperhidrosis: Secondary | ICD-10-CM | POA: Insufficient documentation

## 2013-06-18 DIAGNOSIS — Z7982 Long term (current) use of aspirin: Secondary | ICD-10-CM | POA: Insufficient documentation

## 2013-06-18 DIAGNOSIS — Z79899 Other long term (current) drug therapy: Secondary | ICD-10-CM | POA: Insufficient documentation

## 2013-06-18 DIAGNOSIS — R739 Hyperglycemia, unspecified: Secondary | ICD-10-CM

## 2013-06-18 DIAGNOSIS — Z87442 Personal history of urinary calculi: Secondary | ICD-10-CM | POA: Insufficient documentation

## 2013-06-18 DIAGNOSIS — J45909 Unspecified asthma, uncomplicated: Secondary | ICD-10-CM | POA: Insufficient documentation

## 2013-06-18 DIAGNOSIS — Z87891 Personal history of nicotine dependence: Secondary | ICD-10-CM | POA: Insufficient documentation

## 2013-06-18 DIAGNOSIS — G609 Hereditary and idiopathic neuropathy, unspecified: Secondary | ICD-10-CM | POA: Insufficient documentation

## 2013-06-18 DIAGNOSIS — M1990 Unspecified osteoarthritis, unspecified site: Secondary | ICD-10-CM | POA: Insufficient documentation

## 2013-06-18 DIAGNOSIS — E1140 Type 2 diabetes mellitus with diabetic neuropathy, unspecified: Secondary | ICD-10-CM

## 2013-06-18 LAB — GLUCOSE, CAPILLARY
Glucose-Capillary: 560 mg/dL (ref 70–99)
Glucose-Capillary: 205 mg/dL — ABNORMAL HIGH (ref 70–99)

## 2013-06-18 LAB — CBC
HCT: 35.7 % — ABNORMAL LOW (ref 39.0–52.0)
Hemoglobin: 12.8 g/dL — ABNORMAL LOW (ref 13.0–17.0)
MCH: 31.5 pg (ref 26.0–34.0)
MCV: 87.9 fL (ref 78.0–100.0)
RBC: 4.06 MIL/uL — ABNORMAL LOW (ref 4.22–5.81)

## 2013-06-18 LAB — BASIC METABOLIC PANEL
CO2: 26 mEq/L (ref 19–32)
Calcium: 9.7 mg/dL (ref 8.4–10.5)
Chloride: 95 mEq/L — ABNORMAL LOW (ref 96–112)
Creatinine, Ser: 0.9 mg/dL (ref 0.50–1.35)
Glucose, Bld: 576 mg/dL (ref 70–99)

## 2013-06-18 MED ORDER — SODIUM CHLORIDE 0.9 % IV BOLUS (SEPSIS)
1000.0000 mL | Freq: Once | INTRAVENOUS | Status: AC
  Administered 2013-06-18: 1000 mL via INTRAVENOUS

## 2013-06-18 MED ORDER — INSULIN REGULAR HUMAN 100 UNIT/ML IJ SOLN
10.0000 [IU] | Freq: Once | INTRAMUSCULAR | Status: AC
  Administered 2013-06-18: 10 [IU] via INTRAVENOUS
  Filled 2013-06-18: qty 1

## 2013-06-18 MED ORDER — GABAPENTIN 300 MG PO CAPS: 1200.0000 mg | ORAL_CAPSULE | Freq: Two times a day (BID) | ORAL | Status: DC

## 2013-06-18 MED ORDER — ONDANSETRON 4 MG PO TBDP
4.0000 mg | ORAL_TABLET | Freq: Once | ORAL | Status: AC
  Administered 2013-06-18: 4 mg via ORAL
  Filled 2013-06-18: qty 1

## 2013-06-18 NOTE — ED Provider Notes (Signed)
CSN: [id number]     Arrival date & time 06/18/13  1219 History   First MD Initiated Contact with Patient 06/18/13 1250     Chief Complaint  Patient presents with  . Recurrent Skin Infections    right foot   (Consider location/radiation/quality/duration/timing/severity/associated sxs/prior Treatment) HPI  37 year old male here with right foot pain for the last 2 months. He states the pain started perhaps as a cord about 2 months ago, after about a month it had a fluid collection under it that appeared to be blood in it drained slightly. He closed up and about 1-1/2 weeks and he was seen at urgent care where he was given naproxen and did not help his pain much but he is taking 2 prescription strength naproxen twice a day. Scribed is pain as a severe sharp pain shooting into his foot worsened by pressure, warm soak, or touch. He has diabetes and requires 45 units of Lantus daily as well as 25 units of Humalog 3 times a day a.c. to control his blood sugars. He states he has not been seen by Dr. for diabetes and every year. He self adjusts the dose based on his blood sugars and states that his blood sugars have been 120-150 over the last 2 weeks. He states that for about 2 months she's had intermittent night sweats, although he denies fevers, and chills. He has had nausea in the last few days and one episode of vomit described as "charcoal" one day ago.  Also complains of an itchy intermittent groin without rash. It's Vasiline on this and it helps some. He has severe neuropathy which is managed with gabapentin, he is prescribed 300 mg 3 times a day and is currently taking 1200 mg twice a day.    Past Medical History  Diagnosis Date  . Depression   . Diabetes mellitus   . Hyperlipidemia   . HTN (hypertension)   . Chronic headaches   . Osteoarthritis   . Kidney stones   . Polysubstance abuse     cocaine, marijuana-quit summer 2010  . Tobacco abuse   . Asthma   . Chest pain   . Lumbar disc  disease   . GERD (gastroesophageal reflux disease)   . OSA (obstructive sleep apnea)   . Peripheral neuropathy 02/01/2011  . Bipolar 1 disorder    Past Surgical History  Procedure Laterality Date  . Knee surgery    . Bunionectomy  06-2009   Family History  Problem Relation Age of Onset  . Heart disease Mother   . Diabetes Father   . Hyperlipidemia Father   . Emphysema Maternal Uncle   . Asthma Sister   . Asthma Mother   . Stroke Mother   . Hypertension Father   . Alzheimer's disease Maternal Grandmother   . Depression Father   . Breast cancer Maternal Aunt   . COPD Mother     maternal un   History  Substance Use Topics  . Smoking status: Former Smoker -- 0.20 packs/day for 17 years    Types: Cigarettes    Quit date: 02/24/2011  . Smokeless tobacco: Former Neurosurgeon  . Alcohol Use: Yes     Comment: socially    Review of Systems  Constitutional: Positive for diaphoresis. Negative for fever and chills.  HENT: Negative for sore throat.   Eyes: Negative for visual disturbance.  Respiratory: Negative for cough and shortness of breath.   Gastrointestinal: Positive for nausea and vomiting. Negative for abdominal pain and diarrhea.  Genitourinary: Negative for dysuria.  Musculoskeletal: Negative for back pain.  Skin: Negative for rash.  Neurological: Negative for headaches.    Allergies  Iodine; Lipitor; Nystatin; and Statins  Home Medications   Current Outpatient Rx  Name  Route  Sig  Dispense  Refill  . albuterol (PROVENTIL HFA;VENTOLIN HFA) 108 (90 BASE) MCG/ACT inhaler   Inhalation   Inhale 2 puffs into the lungs every 6 (six) hours as needed. For shortness of breath         . alum & mag hydroxide-simeth (MAALOX/MYLANTA) 200-200-20 MG/5ML suspension   Oral   Take 15 mLs by mouth every 6 (six) hours as needed. For diarrhea         . aspirin EC 81 MG tablet   Oral   Take 81 mg by mouth daily.          . budesonide-formoterol (SYMBICORT) 80-4.5 MCG/ACT  inhaler   Inhalation   Inhale 2 puffs into the lungs 2 (two) times daily.         [name] EXPIRED: buPROPion (WELLBUTRIN SR) 100 MG 12 hr tablet   Oral   Take 1 tablet (100 mg total) by mouth daily.   30 tablet   0   . EXPIRED: buPROPion (WELLBUTRIN) 100 MG tablet   Oral   Take 1 tablet (100 mg total) by mouth daily.   30 tablet   0   . cyclobenzaprine (FLEXERIL) 5 MG tablet   Oral   Take 5 mg by mouth 2 (two) times daily as needed. For muscle spasm         . Diphenhyd-Hydrocort-Nystatin (FIRST-DUKES MOUTHWASH) SUSP   Mouth/Throat   Use as directed 10 mLs in the mouth or throat 4 (four) times daily as needed.   237 mL   0   . docusate sodium (COLACE) 100 MG capsule   Oral   Take 1 capsule (100 mg total) by mouth every 12 (twelve) hours.   60 capsule   0   . ezetimibe (ZETIA) 10 MG tablet   Oral   Take 1 tablet (10 mg total) by mouth daily.   30 tablet   1   . fluticasone (FLONASE) 50 MCG/ACT nasal spray   Nasal   Place 2 sprays into the nose daily.   16 g   0   . EXPIRED: gabapentin (NEURONTIN) 300 MG capsule   Oral   Take 1 capsule (300 mg total) by mouth 3 (three) times daily.   90 capsule   0   . gabapentin (NEURONTIN) 300 MG capsule   Oral   Take 4 capsules (1,200 mg total) by mouth 2 (two) times daily.   240 capsule   0   . glipiZIDE (GLUCOTROL XL) 10 MG 24 hr tablet      1 tab per day for blood sugar more than 200   30 tablet   2   . Glucosamine 500 MG TABS   Oral   Take 1 tablet by mouth daily.          . hydrochlorothiazide (HYDRODIURIL) 25 MG tablet   Oral   Take 1 tablet (25 mg total) by mouth daily.   30 tablet   1   . HYDROcodone-acetaminophen (NORCO/VICODIN) 5-325 MG per tablet   Oral   Take 1 tablet by mouth every 4 (four) hours as needed for pain.   8 tablet   0   . ibuprofen (ADVIL,MOTRIN) 600 MG tablet   Oral   Take 1 tablet (  600 mg total) by mouth every 8 (eight) hours as needed for pain.   15 tablet   0   .  ibuprofen (ADVIL,MOTRIN) 800 MG tablet   Oral   Take 800 mg by mouth every 8 (eight) hours as needed. For pain         . EXPIRED: insulin glargine (LANTUS) 100 UNIT/ML injection   Subcutaneous   Inject 45 Units into the skin at bedtime.   10 mL   4   . EXPIRED: insulin lispro (HUMALOG) 100 UNIT/ML injection   Subcutaneous   Inject 10 Units into the skin 3 (three) times daily before meals.   10 mL   4   . EXPIRED: lisinopril (PRINIVIL,ZESTRIL) 20 MG tablet   Oral   Take 20 mg by mouth daily.         [name] EXPIRED: loratadine (CLARITIN) 10 MG tablet   Oral   Take 1 tablet (10 mg total) by mouth daily.         . Multiple Vitamin (MULTIVITAMIN) capsule   Oral   Take 1 capsule by mouth daily.           . naproxen (NAPROSYN) 500 MG tablet   Oral   Take 1 tablet (500 mg total) by mouth 2 (two) times daily with a meal.   30 tablet   0   . oseltamivir (TAMIFLU) 75 MG capsule   Oral   Take 1 capsule (75 mg total) by mouth every 12 (twelve) hours.   10 capsule   0   . polyethylene glycol powder (GLYCOLAX) powder   Oral   Take 17 g by mouth daily.   255 g   0   . polyethylene glycol powder (GLYCOLAX) powder   Oral   Take 17 g by mouth 3 (three) times daily.   255 g   0   . EXPIRED: sertraline (ZOLOFT) 100 MG tablet   Oral   Take 2 tablets (200 mg total) by mouth daily.   60 tablet   0   . sodium chloride (OCEAN) 0.65 % SOLN nasal spray   Nasal   Place 1 spray into the nose as needed for congestion.         . sodium phosphate (FLEET) enema   Rectal   Place 3 enemas rectally once. follow package directions   135 mL   0    BP 108/80  Temp(Src) 98.3 F (36.8 C) (Oral)  Resp 20  Ht 6\' 1"  (1.854 m)  Wt 175 lb (79.379 kg)  BMI 23.09 kg/m2  SpO2 100% Physical Exam  Nursing note and vitals reviewed. Constitutional: He is oriented to person, place, and time. He appears well-developed and well-nourished. No distress.  HENT:  Head: Normocephalic and  atraumatic.  Eyes: EOM are normal. Pupils are equal, round, and reactive to light.  Neck: Normal range of motion. Neck supple.  Cardiovascular: Normal rate, regular rhythm and normal heart sounds.   Pulmonary/Chest: Effort normal and breath sounds normal. No respiratory distress. He has no wheezes.  Abdominal: Soft. Bowel sounds are normal. There is no tenderness.  Musculoskeletal: He exhibits no edema.  Neurological: He is alert and oriented to person, place, and time.  Skin: Skin is warm and dry. He is not diaphoretic.  R foot dorsum with 2 cm by 1.5 cm tender callus without erythema, warmth, or induration.  Second hyperpigmented circular lesion approx 7-10 mm in diameter just medial to above described corn.   Psychiatric: He has  a normal mood and affect.    ED Course  Procedures (including critical care time) Labs Review Labs Reviewed  CBC - Abnormal; Notable for the following:    RBC 4.06 (*)    Hemoglobin 12.8 (*)    HCT 35.7 (*)    All other components within normal limits  BASIC METABOLIC PANEL - Abnormal; Notable for the following:    Sodium 131 (*)    Chloride 95 (*)    Glucose, Bld 576 (*)    All other components within normal limits  GLUCOSE, CAPILLARY - Abnormal; Notable for the following:    Glucose-Capillary 560 (*)    All other components within normal limits  GLUCOSE, CAPILLARY - Abnormal; Notable for the following:    Glucose-Capillary 205 (*)    All other components within normal limits  OCCULT BLOOD X 1 CARD TO LAB, STOOL  HEMOGLOBIN A1C   Imaging Review [place] Extrem Low Right Comp  06/18/2013   CLINICAL DATA:  Pain along the ball of the right foot. Neuropathy. Diabetes.  EXAM: RIGHT LOWER EXTREMITY SOFT TISSUE ULTRASOUND COMPLETE  TECHNIQUE: Ultrasound examination was performed including evaluation of the muscles, tendons, joint, and adjacent soft tissues.  COMPARISON:  None.  FINDINGS: Mass or cyst is observed. No abscess. Specific abnormality observed.   IMPRESSION: No specific abnormality along the plantar ball of the foot is observed on ultrasound. Please note that MRI has a much higher sensitivity for entities such as Morton's neuroma.   Electronically Signed   By: Herbie Baltimore M.D.   On: 06/18/2013 14:08    EKG Interpretation   None       MDM   1. Hyperglycemia   2. Callus of foot   3. Diabetic neuropathy   4. T2DM (type 2 diabetes mellitus)     37 year old male here with right foot pain most consistent with painful callus in the setting of diabetic neuropathy, will consider abscess versus foreign body as well. Initial CBG 560 and anion gap is 10 on BMP. Patient states that he has not taken his insulin this morning and did eat breakfast. Advised to continue his current insulin regimen, but to work on compliance.  Callus without erythema induration or warmth to indicate infection. Ultrasound soft tissue the right foot was performed which did not show abscess or discrete foreign body. Treat supportively as severe diabetic neuropathy in the setting of painful callus. Rx given for gabapentin at 1200 mg twice a day, which is established dose.  [facility]. Encouraged to establish care with PCP.  Discussed red flags at length, advised returning for worsening symptoms.   Upper GI bleed considered with "charcoal" emesis, stool guaiac is negative here. Advised patient to take naproxen only as prescribed.  Murtis Sink, MD [facility] Family Medicine Resident, PGY-2 06/18/2013, 3:59 PM       Elenora Gamma, MD 06/18/13 1600

## 2013-06-18 NOTE — ED Notes (Signed)
Patient states he has DM and neuropathy of his feet.  States he has an infection on the ball of the right foot for the last one month.  States he was seen and treated at [facility] two weeks ago with Naproxen only.  States he has continued to soak the foot, but now has increased pain.

## 2013-06-19 NOTE — ED Provider Notes (Signed)
Medical screening examination/treatment/procedure(s) were conducted as a shared visit with resident-physician practitioner(s) and myself.  I personally evaluated the patient during the encounter.  Pt is a 37 y.o. male with pmhx as above presenting with multiple complains.  Pt found to have 2 corns on bottom of R foot, but also had FSBG >500 w/o labs c/w DKA.  Pt given IVF, IV insulin and strongly encouraged he establish with a PCP.  Will take no intervention on his foot as his diabetes is poorly controlled ans he is at risk for poor wound healing.     Shanna Cisco, MD 06/19/13 [phone]

## 2013-08-06 ENCOUNTER — Ambulatory Visit: Payer: Self-pay

## 2013-08-17 ENCOUNTER — Ambulatory Visit (INDEPENDENT_AMBULATORY_CARE_PROVIDER_SITE_OTHER): Payer: BC Managed Care – PPO | Admitting: Podiatry

## 2013-08-17 ENCOUNTER — Encounter: Payer: Self-pay | Admitting: Podiatry

## 2013-08-17 VITALS — BP 115/97 | HR 104 | Ht 74.0 in | Wt 172.0 lb

## 2013-08-17 DIAGNOSIS — M21969 Unspecified acquired deformity of unspecified lower leg: Secondary | ICD-10-CM

## 2013-08-17 DIAGNOSIS — M21961 Unspecified acquired deformity of right lower leg: Secondary | ICD-10-CM | POA: Insufficient documentation

## 2013-08-17 DIAGNOSIS — G629 Polyneuropathy, unspecified: Secondary | ICD-10-CM

## 2013-08-17 DIAGNOSIS — G609 Hereditary and idiopathic neuropathy, unspecified: Secondary | ICD-10-CM

## 2013-08-17 NOTE — Progress Notes (Signed)
Subjective:  38 year old male presents stating that he has neuropathy pain, callus hurts, pinky toe hurts, has fungus in nail, and hurts to walk.  When on feet, hurts at instep and lateral column of right foot. Both feet hurts in center ball and tip of toes.  [facility] States surgery helped at that time, but now it hurts again.  Stated that his blood sugar this morning was 340.  He is taking Gabapentin for nerve pain.   Review of Systems - General ROS: negative for - chills, fatigue, fever, malaise, night sweats or sleep disturbance Ophthalmic ROS: Has blurred vision at times. ENT ROS: negative Hematological and Lymphatic ROS: negative Endocrine ROS: negative Respiratory ROS: Has difficulty breathing.  Cardiovascular ROS: Occasional chest pain. Gastrointestinal ROS: Gets constipation and acid reflux with certain food.  Genito-Urinary ROS: Bubbling and dark in color.  Musculoskeletal ROS: Knees, feet, back, and shoulder pain constant.  Neurological ROS: Always shocking pain in hands, feet, and neck. Dermatological ROS: Dry skin and white marks with itch feeling.   Objective: Dermatologic: Plantar callus under the 2nd MPJ right. Vascular: Pedal pulses are all faintly palpable.  Both feet are cold to touch. Neurologic: Subjective numbness and tingling on both feet. Failed to respond to Monofilament sensory testing.  Orthopedic: Hypermobile first ray bilateral. Plantar flexed 2nd and 4 th MPJ right, elevated first and 5 th Metatarsal right.   Assessment: Non controlled INDDM. Painful callus under 2nd MPJ right. Diabetic neuropathy. Deformed Metatarsal bilateral.   Plan: Reviewed clinical findings and available options. Debrided painful callus from right foot.  Continue to keep padded shoe liner in place of Orthotics. Return as needed.

## 2013-08-17 NOTE — Patient Instructions (Signed)
Seen for painful feet.  Reviewed clinical findings and available options. Most of all, keeping the blood sugar under control to lessen the neuropathy pain.  Debrided painful callus from right foot.  Continue to keep padded shoe liner in place of Orthotics. Return as needed.

## 2013-11-12 ENCOUNTER — Encounter: Payer: Self-pay | Admitting: Podiatry

## 2013-11-12 ENCOUNTER — Ambulatory Visit (INDEPENDENT_AMBULATORY_CARE_PROVIDER_SITE_OTHER): Payer: BC Managed Care – PPO | Admitting: Podiatry

## 2013-11-12 VITALS — BP 113/74 | HR 90 | Ht 74.0 in | Wt 169.0 lb

## 2013-11-12 DIAGNOSIS — L600 Ingrowing nail: Secondary | ICD-10-CM

## 2013-11-12 DIAGNOSIS — L03039 Cellulitis of unspecified toe: Secondary | ICD-10-CM | POA: Insufficient documentation

## 2013-11-12 HISTORY — DX: Ingrowing nail: L60.0

## 2013-11-12 MED ORDER — EFINACONAZOLE 10 % EX SOLN: 1.0000 | Freq: Every morning | CUTANEOUS | Status: DC

## 2013-11-12 NOTE — Progress Notes (Signed)
38 year old male presents with infected nail on right great toe. 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 Today came in with soiled foot.  Objective: Draining ingrown nail right hallux lateral border. Hallux valgus with overlapping first and 2nd digit right.  Neurovascular status are within normal.  Assessment: Ingrown nail with infection right great toe.  Plan: He works Monday through Saturday. Will benefit to have ingrown nail surgery this Friday and Saturday off to give two days of rest before going back to work.  He will return this Friday for P&A procedure.

## 2013-11-12 NOTE — Patient Instructions (Signed)
Need ingrown nail surgery on right great toe this Friday. Also need Orthotic shoe inserts to reduce pain from callus on ball of foot. Meantime soak in Vinegar water and keep the nail clean and covered.

## 2013-11-16 ENCOUNTER — Ambulatory Visit: Payer: BC Managed Care – PPO | Admitting: Podiatry

## 2013-11-18 ENCOUNTER — Emergency Department (HOSPITAL_COMMUNITY): Payer: BC Managed Care – PPO

## 2013-11-18 ENCOUNTER — Emergency Department (HOSPITAL_COMMUNITY)
Admission: EM | Admit: 2013-11-18 | Discharge: 2013-11-18 | Disposition: A | Discharge status: Home / Self Care | Payer: BC Managed Care – PPO | Attending: Emergency Medicine | Admitting: Emergency Medicine

## 2013-11-18 ENCOUNTER — Encounter (HOSPITAL_COMMUNITY): Payer: Self-pay | Admitting: Emergency Medicine

## 2013-11-18 DIAGNOSIS — Z91199 Patient's noncompliance with other medical treatment and regimen due to unspecified reason: Secondary | ICD-10-CM | POA: Insufficient documentation

## 2013-11-18 DIAGNOSIS — G8929 Other chronic pain: Secondary | ICD-10-CM | POA: Insufficient documentation

## 2013-11-18 DIAGNOSIS — IMO0002 Reserved for concepts with insufficient information to code with codable children: Secondary | ICD-10-CM | POA: Insufficient documentation

## 2013-11-18 DIAGNOSIS — K219 Gastro-esophageal reflux disease without esophagitis: Secondary | ICD-10-CM | POA: Insufficient documentation

## 2013-11-18 DIAGNOSIS — F319 Bipolar disorder, unspecified: Secondary | ICD-10-CM | POA: Insufficient documentation

## 2013-11-18 DIAGNOSIS — I10 Essential (primary) hypertension: Secondary | ICD-10-CM | POA: Insufficient documentation

## 2013-11-18 DIAGNOSIS — Z8739 Personal history of other diseases of the musculoskeletal system and connective tissue: Secondary | ICD-10-CM | POA: Insufficient documentation

## 2013-11-18 DIAGNOSIS — Z87442 Personal history of urinary calculi: Secondary | ICD-10-CM | POA: Insufficient documentation

## 2013-11-18 DIAGNOSIS — Z79899 Other long term (current) drug therapy: Secondary | ICD-10-CM | POA: Insufficient documentation

## 2013-11-18 DIAGNOSIS — S43109A Unspecified dislocation of unspecified acromioclavicular joint, initial encounter: Secondary | ICD-10-CM

## 2013-11-18 DIAGNOSIS — Z87891 Personal history of nicotine dependence: Secondary | ICD-10-CM | POA: Insufficient documentation

## 2013-11-18 DIAGNOSIS — Z794 Long term (current) use of insulin: Secondary | ICD-10-CM | POA: Insufficient documentation

## 2013-11-18 DIAGNOSIS — E119 Type 2 diabetes mellitus without complications: Secondary | ICD-10-CM | POA: Insufficient documentation

## 2013-11-18 DIAGNOSIS — Z7982 Long term (current) use of aspirin: Secondary | ICD-10-CM | POA: Insufficient documentation

## 2013-11-18 DIAGNOSIS — Z9119 Patient's noncompliance with other medical treatment and regimen: Secondary | ICD-10-CM | POA: Insufficient documentation

## 2013-11-18 DIAGNOSIS — Y9389 Activity, other specified: Secondary | ICD-10-CM | POA: Insufficient documentation

## 2013-11-18 DIAGNOSIS — J45909 Unspecified asthma, uncomplicated: Secondary | ICD-10-CM | POA: Insufficient documentation

## 2013-11-18 DIAGNOSIS — Y92410 Unspecified street and highway as the place of occurrence of the external cause: Secondary | ICD-10-CM | POA: Insufficient documentation

## 2013-11-18 DIAGNOSIS — E785 Hyperlipidemia, unspecified: Secondary | ICD-10-CM | POA: Insufficient documentation

## 2013-11-18 DIAGNOSIS — Z8669 Personal history of other diseases of the nervous system and sense organs: Secondary | ICD-10-CM | POA: Insufficient documentation

## 2013-11-18 LAB — CBG MONITORING, ED: Glucose-Capillary: 195 mg/dL — ABNORMAL HIGH (ref 70–99)

## 2013-11-18 LAB — I-STAT CHEM 8, ED
BUN: 17 mg/dL (ref 6–23)
CALCIUM ION: 1.24 mmol/L — AB (ref 1.12–1.23)
Chloride: 98 mEq/L (ref 96–112)
Creatinine, Ser: 1.1 mg/dL (ref 0.50–1.35)
Glucose, Bld: 532 mg/dL — ABNORMAL HIGH (ref 70–99)
HCT: 44 % (ref 39.0–52.0)
Hemoglobin: 15 g/dL (ref 13.0–17.0)
Potassium: 4 mEq/L (ref 3.7–5.3)
Sodium: 138 mEq/L (ref 137–147)
TCO2: 25 mmol/L (ref 0–100)

## 2013-11-18 MED ORDER — MORPHINE SULFATE 4 MG/ML IJ SOLN
6.0000 mg | Freq: Once | INTRAMUSCULAR | Status: AC
  Administered 2013-11-18: 6 mg via INTRAVENOUS
  Filled 2013-11-18: qty 2

## 2013-11-18 MED ORDER — SODIUM CHLORIDE 0.9 % IV BOLUS (SEPSIS)
1000.0000 mL | Freq: Once | INTRAVENOUS | Status: AC
  Administered 2013-11-18: 1000 mL via INTRAVENOUS

## 2013-11-18 MED ORDER — INSULIN ASPART 100 UNIT/ML ~~LOC~~ SOLN
12.0000 [IU] | Freq: Once | SUBCUTANEOUS | Status: AC
  Administered 2013-11-18: 12 [IU] via SUBCUTANEOUS
  Filled 2013-11-18: qty 1

## 2013-11-18 MED ORDER — MORPHINE SULFATE 4 MG/ML IJ SOLN
4.0000 mg | Freq: Once | INTRAMUSCULAR | Status: AC
  Administered 2013-11-18: 4 mg via INTRAVENOUS
  Filled 2013-11-18: qty 1

## 2013-11-18 MED ORDER — HYDROCODONE-ACETAMINOPHEN 5-325 MG PO TABS: 1.0000 | ORAL_TABLET | Freq: Four times a day (QID) | ORAL | Status: DC | PRN

## 2013-11-18 MED ORDER — HYDROCODONE-ACETAMINOPHEN 5-325 MG PO TABS
1.0000 | ORAL_TABLET | Freq: Once | ORAL | Status: AC
  Administered 2013-11-18: 1 via ORAL
  Filled 2013-11-18: qty 1
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
## 2013-11-18 NOTE — ED Notes (Signed)
Bed: WA18 Expected date:  Expected time:  Means of arrival:  Comments: EMS- fall, back pain 

## 2013-11-18 NOTE — ED Notes (Signed)
Pt ambulated in the room without assistance.  
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
## 2013-11-18 NOTE — Discharge Instructions (Signed)
Acromioclavicular Injuries Call Dr. Gary FleetBrooks's office tomorrow to schedule the next available appointment. Tell Office tablet you were seen here. Take Tylenol for mild pain or the pain medicine prescribed for bad pain. Take your diabetic medications and all medications as prescribed. Your blood sugar when he arrived here today was 532. It came down to 195 after treatment here. The acromioclavicular [facility](AC) joint is the joint in the shoulder. There are many bands of tissue (ligaments) that surround the [facility] bones and joints. These bands of tissue can tear, which can lead to sprains and separations. The bones of the [facility] joint can also break (fracture).  HOME CARE   Put ice on the injured area.  Put ice in a plastic bag.  Place a towel between your skin and the bag.  Leave the ice on for 15-20 minutes, 03-04 times a day.  Wear your sling as told by your doctor. Remove the sling before showering and bathing. Keep the shoulder in the same place as when the sling is on. Do not lift the arm.  Gently tighten your figure-eight splint (if applied) every day. Tighten it enough to keep the shoulders held back. There should be room to place your finger between your body and the strap. Loosen the splint right away if you lose feeling (numbness) or have tingling in your hands.  Only take medicine as told by your doctor.  Keep all follow-up visits with your doctor. GET HELP RIGHT AWAY IF:   Your medicine does not help your pain.  You have more puffiness (swelling) or your bruising gets worse rather than better.  You were unable to follow up as told by your doctor.  You have tingling or lose even more feeling in your arm, forearm, or hand.  Your arm is cold or pale.  You have more pain in the hand, forearm, or fingers. MAKE SURE YOU:   Understand these instructions.  Will watch your condition.  Will get help right away if you are not doing well or get worse. Document Released: 12/30/2009 Document  Revised: 10/04/2011 Document Reviewed: 12/30/2009 [facility] Patient Information 2014 [place], [place].

## 2013-11-23 ENCOUNTER — Ambulatory Visit: Payer: BC Managed Care – PPO | Admitting: Podiatry

## 2013-11-30 ENCOUNTER — Ambulatory Visit: Payer: BC Managed Care – PPO | Admitting: Podiatry

## 2013-12-04 ENCOUNTER — Ambulatory Visit: Payer: BC Managed Care – PPO | Admitting: Podiatry

## 2014-01-02 ENCOUNTER — Encounter (HOSPITAL_BASED_OUTPATIENT_CLINIC_OR_DEPARTMENT_OTHER): Payer: Self-pay | Admitting: Emergency Medicine

## 2014-01-02 ENCOUNTER — Emergency Department (HOSPITAL_BASED_OUTPATIENT_CLINIC_OR_DEPARTMENT_OTHER)
Admission: EM | Admit: 2014-01-02 | Discharge: 2014-01-02 | Disposition: A | Discharge status: Home / Self Care | Payer: BC Managed Care – PPO | Attending: Emergency Medicine | Admitting: Emergency Medicine

## 2014-01-02 DIAGNOSIS — Z8739 Personal history of other diseases of the musculoskeletal system and connective tissue: Secondary | ICD-10-CM | POA: Insufficient documentation

## 2014-01-02 DIAGNOSIS — Z87891 Personal history of nicotine dependence: Secondary | ICD-10-CM | POA: Insufficient documentation

## 2014-01-02 DIAGNOSIS — E785 Hyperlipidemia, unspecified: Secondary | ICD-10-CM | POA: Insufficient documentation

## 2014-01-02 DIAGNOSIS — H2000 Unspecified acute and subacute iridocyclitis: Secondary | ICD-10-CM | POA: Insufficient documentation

## 2014-01-02 DIAGNOSIS — Z872 Personal history of diseases of the skin and subcutaneous tissue: Secondary | ICD-10-CM | POA: Insufficient documentation

## 2014-01-02 DIAGNOSIS — G609 Hereditary and idiopathic neuropathy, unspecified: Secondary | ICD-10-CM | POA: Insufficient documentation

## 2014-01-02 DIAGNOSIS — E119 Type 2 diabetes mellitus without complications: Secondary | ICD-10-CM | POA: Insufficient documentation

## 2014-01-02 DIAGNOSIS — Z794 Long term (current) use of insulin: Secondary | ICD-10-CM | POA: Insufficient documentation

## 2014-01-02 DIAGNOSIS — G8929 Other chronic pain: Secondary | ICD-10-CM | POA: Insufficient documentation

## 2014-01-02 DIAGNOSIS — Z7982 Long term (current) use of aspirin: Secondary | ICD-10-CM | POA: Insufficient documentation

## 2014-01-02 DIAGNOSIS — F319 Bipolar disorder, unspecified: Secondary | ICD-10-CM | POA: Insufficient documentation

## 2014-01-02 DIAGNOSIS — Z79899 Other long term (current) drug therapy: Secondary | ICD-10-CM | POA: Insufficient documentation

## 2014-01-02 DIAGNOSIS — J45909 Unspecified asthma, uncomplicated: Secondary | ICD-10-CM | POA: Insufficient documentation

## 2014-01-02 DIAGNOSIS — M1990 Unspecified osteoarthritis, unspecified site: Secondary | ICD-10-CM | POA: Insufficient documentation

## 2014-01-02 DIAGNOSIS — R000 Tachycardia, unspecified: Secondary | ICD-10-CM | POA: Insufficient documentation

## 2014-01-02 DIAGNOSIS — I10 Essential (primary) hypertension: Secondary | ICD-10-CM | POA: Insufficient documentation

## 2014-01-02 DIAGNOSIS — IMO0002 Reserved for concepts with insufficient information to code with codable children: Secondary | ICD-10-CM | POA: Insufficient documentation

## 2014-01-02 DIAGNOSIS — H109 Unspecified conjunctivitis: Secondary | ICD-10-CM | POA: Insufficient documentation

## 2014-01-02 DIAGNOSIS — K219 Gastro-esophageal reflux disease without esophagitis: Secondary | ICD-10-CM | POA: Insufficient documentation

## 2014-01-02 DIAGNOSIS — Z87442 Personal history of urinary calculi: Secondary | ICD-10-CM | POA: Insufficient documentation

## 2014-01-02 MED ORDER — TOBRAMYCIN 0.3 % OP SOLN
1.0000 [drp] | OPHTHALMIC | Status: DC
  Administered 2014-01-02: 1 [drp] via OPHTHALMIC
  Filled 2014-01-02: qty 5

## 2014-01-02 MED ORDER — TETRACAINE HCL 0.5 % OP SOLN
OPHTHALMIC | Status: AC
  Administered 2014-01-02: 2 [drp] via OPHTHALMIC
  Filled 2014-01-02: qty 2

## 2014-01-02 MED ORDER — TETRACAINE HCL 0.5 % OP SOLN
2.0000 [drp] | Freq: Once | OPHTHALMIC | Status: AC
  Administered 2014-01-02: 2 [drp] via OPHTHALMIC

## 2014-01-02 MED ORDER — ERYTHROMYCIN 5 MG/GM OP OINT
TOPICAL_OINTMENT | Freq: Every day | OPHTHALMIC | Status: DC
  Administered 2014-01-02: 12:00:00 via OPHTHALMIC
  Filled 2014-01-02: qty 3.5

## 2014-01-02 MED ORDER — HOMATROPINE HBR 2 % OP SOLN
1.0000 [drp] | OPHTHALMIC | Status: DC
  Filled 2014-01-02: qty 5

## 2014-01-02 MED ORDER — FLUORESCEIN SODIUM 1 MG OP STRP
1.0000 | ORAL_STRIP | Freq: Once | OPHTHALMIC | Status: AC
  Administered 2014-01-02: 1 via OPHTHALMIC

## 2014-01-02 MED ORDER — FLUORESCEIN SODIUM 1 MG OP STRP
ORAL_STRIP | OPHTHALMIC | Status: AC
  Administered 2014-01-02: 1 via OPHTHALMIC
  Filled 2014-01-02: qty 1

## 2014-01-02 NOTE — ED Notes (Signed)
?  Fb to right eye while cutting grass Saturday

## 2014-01-02 NOTE — Discharge Instructions (Signed)
Iritis °Iritis is an inflammation of the colored part of the eye (iris). Other parts at the front of the eye may also be inflamed. The iris is part of the middle layer of the eyeball which is called the uvea or the uveal track. Any part of the uveal track can become inflamed. The other portions of the uveal track are the choroid (the thin membrane under the outer layer of the eye), and the ciliary body (joins the choroid and the iris and produces the fluid in the front of the eye).  °It is extremely important to treat iritis early, as it may lead to internal eye damage causing scarring or diseases such as glaucoma. Some people have only one attack of iritis (in one or both eyes) in their lifetime, while others may get it many times. °CAUSES °Iritis can be associated with many different diseases, but mostly occurs in otherwise healthy people. Examples of diseases that can be associated with iritis include: °· Diseases where the body's immune system attacks tissues within your own body (autoimmune diseases). °· Infections (tuberculosis, gonorrhea, fungus infections, Lyme disease, infection of the lining of the heart). °· Trauma or injury. °· Eye diseases (acute glaucoma and others). °· Inflammation from other parts of the uveal track. °· Severe eye infections. °· Other rare diseases. °SYMPTOMS °· Eye pain or aching. °· Sensitivity to light. °· Loss of sight or blurred vision. °· Redness of the eye. This is often accompanied by a ring of redness around the outside of the cornea, or clear covering at the front of the eye (ciliary flush). °· Excessive tearing of the eye(s). °· A small pupil that does not enlarge in the dark and stays smaller than the other eye's pupil. °· A whitish area that obscures the lower part of the colored circular iris. Sometimes this is visible when looking at the eye, where the whitish area has a "fluid level" or flat top. This is called a "hypopyon" and is actually pus inside the eye. °Since  iritis causes the eye to become red, it is often confused with a much less dangerous form of "pink eye" or conjunctivitis. One of the most important symptoms is sensitivity to light. Anytime there is redness, discomfort in the eye(s) and extreme light sensitivity, it is extremely important to see an ophthalmologist as soon as possible. °TREATMENT °Acute iritis requires prompt medical evaluation by an eye specialist (ophthalmologist.) Treatment depends on the underlying cause but may include: °· Corticosteroid eye drops and dilating eye drops. Follow your caregiver's exact instructions on taking and stopping corticosteroid medications (drops or pills). °· Occasionally, the iritis will be so severe that it will not respond to commonly used medications. If this happens, it may be necessary to use steroid injections. The injections are given under the eye's outer surface. Sometimes oral medications are given. The decision on treatment used for iritis is usually made on an individual basis. °HOME CARE INSTRUCTIONS °Your care giver will give specific instructions regarding the use of eye medications or other medications. Be certain to follow all instructions in both taking and stopping the medications. °SEEK IMMEDIATE MEDICAL CARE IF: °· You have redness of one or both eye. °· You experience a great deal of light sensitivity. °· You have pain or aching in either eye. °MAKE SURE YOU:  °· Understand these instructions. °· Will watch your condition. °· Will get help right away if you are not doing well or get worse. °Document Released: 07/12/2005 Document Revised: 10/04/2011 Document   Reviewed: 12/30/2006 °ExitCare® Patient Information ©2014 [employer]. ° °

## 2014-01-02 NOTE — ED Provider Notes (Addendum)
CSN: [id number]     Arrival date & time 01/02/14  1113 History   First MD Initiated Contact with Patient 01/02/14 1136     Chief Complaint  Patient presents with  . Eye Pain     (Consider location/radiation/quality/duration/timing/severity/associated sxs/prior Treatment) Patient is a 38 y.o. male presenting with eye pain. The history is provided by the patient.  Eye Pain This is a new problem. Episode onset: 4 days ago. The problem occurs constantly. The problem has been gradually worsening. Associated symptoms comments: After mowing grass on sat pt developed redness, drainage and pain in the right eye that continues to worsen. Exacerbated by: bright light and opening eye. Nothing relieves the symptoms. Treatments tried: artificial tears. The treatment provided no relief.    Past Medical History  Diagnosis Date  . Depression   . Diabetes mellitus   . Hyperlipidemia   . HTN (hypertension)   . Chronic headaches   . Osteoarthritis   . Kidney stones   . Polysubstance abuse     cocaine, marijuana-quit summer 2010  . Tobacco abuse   . Asthma   . Chest pain   . Lumbar disc disease   . GERD (gastroesophageal reflux disease)   . OSA (obstructive sleep apnea)   . Peripheral neuropathy 02/01/2011  . Bipolar 1 disorder    Past Surgical History  Procedure Laterality Date  . Knee surgery    . Bunionectomy  06-2009   Family History  Problem Relation Age of Onset  . Heart disease Mother   . Diabetes Father   . Hyperlipidemia Father   . Emphysema Maternal Uncle   . Asthma Sister   . Asthma Mother   . Stroke Mother   . Hypertension Father   . Alzheimer's disease Maternal Grandmother   . Depression Father   . Breast cancer Maternal Aunt   . COPD Mother     maternal un   History  Substance Use Topics  . Smoking status: Former Smoker -- 0.20 packs/day for 17 years    Types: Cigarettes    Quit date: 02/24/2011  . Smokeless tobacco: Former Neurosurgeon  . Alcohol Use: Yes     Comment:  socially    Review of Systems  Eyes: Positive for photophobia, pain and discharge. Negative for visual disturbance.  All other systems reviewed and are negative.     Allergies  Iodine; Lipitor; Nystatin; and Statins  Home Medications   Prior to Admission medications   Medication Sig Start Date End Date Taking? Authorizing Provider  Insulin Detemir (LEVEMIR FLEXPEN [place]) Inject into the skin.   Yes Historical Provider, MD  ABILIFY 10 MG tablet Take 10 mg by mouth 2 (two) times daily.  10/25/13   Historical Provider, MD  albuterol (PROVENTIL HFA;VENTOLIN HFA) 108 (90 BASE) MCG/ACT inhaler Inhale 2 puffs into the lungs every 6 (six) hours as needed. For shortness of breath    Historical Provider, MD  alum & mag hydroxide-simeth (MAALOX/MYLANTA) 200-200-20 MG/5ML suspension Take 15 mLs by mouth every 6 (six) hours as needed. For diarrhea    Historical Provider, MD  aspirin EC 81 MG tablet Take 81 mg by mouth daily.     Historical Provider, MD  atenolol (TENORMIN) 25 MG tablet Take 25 mg by mouth daily.  11/06/13   Historical Provider, MD  budesonide-formoterol (SYMBICORT) 80-4.5 MCG/ACT inhaler Inhale 2 puffs into the lungs 2 (two) times daily.    Historical Provider, MD  buPROPion (WELLBUTRIN) 100 MG tablet Take 100 mg by mouth  2 (two) times daily.    Historical Provider, MD  Efinaconazole (JUBLIA) 10 % SOLN Apply 1 applicator topically every morning. 11/12/13   Myeong Sheard, DPM  ezetimibe (ZETIA) 10 MG tablet Take 1 tablet (10 mg total) by mouth daily. 03/26/13   [name], MD  fluticasone (FLONASE) 50 MCG/ACT nasal spray Place 2 sprays into both nostrils daily.  11/05/13   Historical Provider, MD  gabapentin (NEURONTIN) 300 MG capsule Take 4 capsules (1,200 mg total) by mouth 2 (two) times daily. 06/18/13   Elenora Gamma, MD  glipiZIDE (GLUCOTROL XL) 10 MG 24 hr tablet 1 tab per day for blood sugar more than 200 05/29/12   John-Adam Bonk, MD  Glucosamine 500 MG TABS Take 1 tablet by  mouth daily.     Historical Provider, MD  HYDROcodone-acetaminophen (NORCO) 5-325 MG per tablet Take 1-2 tablets by mouth every 6 (six) hours as needed for severe pain. 11/18/13   Doug Sou, MD  ibuprofen (ADVIL,MOTRIN) 200 MG tablet Take 600 mg by mouth every 8 (eight) hours as needed for mild pain.    Historical Provider, MD  insulin glargine (LANTUS) 100 UNIT/ML injection Inject 45 Units into the skin at bedtime. 05/29/12 08/17/13  John-Adam Bonk, MD  insulin lispro (HUMALOG) 100 UNIT/ML injection Inject 10 Units into the skin 3 (three) times daily before meals. 05/29/12 08/17/13  John-Adam Bonk, MD  INVOKANA 100 MG TABS Take 100 mg by mouth daily.  11/06/13   Historical Provider, MD  lidocaine (XYLOCAINE) 2 % solution Use as directed in the mouth or throat 2 (two) times daily.    Historical Provider, MD  lisinopril (PRINIVIL,ZESTRIL) 20 MG tablet Take 20 mg by mouth daily. 04/19/11 08/17/13  Corwin Levins, MD  loratadine (CLARITIN) 10 MG tablet Take 1 tablet (10 mg total) by mouth daily. 07/30/11 07/29/12  Maryruth Bun Rama, MD  losartan (COZAAR) 50 MG tablet Take 50 mg by mouth daily.  11/05/13   Historical Provider, MD  metFORMIN (GLUCOPHAGE) 1000 MG tablet Take 1,000 mg by mouth 2 (two) times daily with a meal.  11/06/13   Historical Provider, MD  NEXIUM 40 MG capsule Take 40 mg by mouth daily.  11/05/13   Historical Provider, MD  NOVOLOG MIX 70/30 FLEXPEN (70-30) 100 UNIT/ML Pen Inject 25 Units into the skin 2 (two) times daily.  11/05/13   Historical Provider, MD  sertraline (ZOLOFT) 100 MG tablet Take 2 tablets (200 mg total) by mouth daily. 10/04/11 08/17/13  Glynn Octave, MD  sertraline (ZOLOFT) 100 MG tablet Take 150 mg by mouth daily.    Historical Provider, MD   BP 118/74  Pulse 114  Temp(Src) 98 F (36.7 C) (Oral)  Resp 17  Ht 6\' 2"  (1.88 m)  Wt 164 lb (74.39 kg)  BMI 21.05 kg/m2  SpO2 100% Physical Exam  Nursing note and vitals reviewed. Constitutional: He is oriented to person,  place, and time. He appears well-developed and well-nourished. No distress.  HENT:  Head: Normocephalic and atraumatic.  Eyes: Right conjunctiva is injected. Right pupil is round and reactive. Left pupil is round and reactive. Pupils are equal.  Fundoscopic exam:      The right eye shows no exudate and no hemorrhage.  Slit lamp exam:      The right eye shows corneal flare. The right eye shows no corneal abrasion, no corneal ulcer, no foreign body and no fluorescein uptake.  No consensual photophobia  Cardiovascular: Tachycardia present.   Pulmonary/Chest: Effort normal.  Neurological: He is alert and oriented to person, place, and time.  Skin: Skin is warm and dry.  Psychiatric: He has a normal mood and affect.    ED Course  Procedures (including critical care time) Labs Review Labs Reviewed - No data to display  Imaging Review No results found.   EKG Interpretation None      MDM   Final diagnoses:  Conjunctivitis  Acute iritis of right eye   Patient with a history of eye pain redness and drainage starting 3 days ago. He denies any foreign body sensation at this time but states it started after he was cutting the grass and thinks he got something in his eye. He denies any blurry vision but is extremely sensitive to light. He denies wearing contacts or prior eye issues. No prior history of glaucoma but does have a history of diabetes and hypertension. On exam patient's had no fluorescein uptake the pupils were reactive bilaterally with severe photophobia and conjunctival injection. Patient initially would not cooperate with visual acuity due to pain but significant pain relief with tetracaine.  Visual acuity is 20/70 on the right and 20/100 on the left.  Low suspicion for glaucoma at this time and pt denies blurred vision or change in his vision. Pt given cycloplegic meds to help with pain and uveitis and abx.  Given f/u with ophtho if worsening.  Gwyneth SproutWhitney Amybeth Sieg, MD 01/02/14  [id number]  Gwyneth SproutWhitney Keliyah Spillman, MD 01/02/14 1214

## 2014-01-04 ENCOUNTER — Ambulatory Visit (INDEPENDENT_AMBULATORY_CARE_PROVIDER_SITE_OTHER): Payer: BC Managed Care – PPO | Admitting: Podiatry

## 2014-01-04 ENCOUNTER — Encounter: Payer: Self-pay | Admitting: Podiatry

## 2014-01-04 VITALS — BP 125/85 | HR 99

## 2014-01-04 DIAGNOSIS — E119 Type 2 diabetes mellitus without complications: Secondary | ICD-10-CM

## 2014-01-04 DIAGNOSIS — L600 Ingrowing nail: Secondary | ICD-10-CM

## 2014-01-04 DIAGNOSIS — M216X1 Other acquired deformities of right foot: Secondary | ICD-10-CM

## 2014-01-04 DIAGNOSIS — M79609 Pain in unspecified limb: Secondary | ICD-10-CM

## 2014-01-04 DIAGNOSIS — M21969 Unspecified acquired deformity of unspecified lower leg: Secondary | ICD-10-CM

## 2014-01-04 DIAGNOSIS — M216X9 Other acquired deformities of unspecified foot: Secondary | ICD-10-CM

## 2014-01-04 DIAGNOSIS — M79606 Pain in leg, unspecified: Secondary | ICD-10-CM

## 2014-01-04 DIAGNOSIS — M21961 Unspecified acquired deformity of right lower leg: Secondary | ICD-10-CM

## 2014-01-04 DIAGNOSIS — B351 Tinea unguium: Secondary | ICD-10-CM | POA: Insufficient documentation

## 2014-01-04 DIAGNOSIS — M216X2 Other acquired deformities of left foot: Secondary | ICD-10-CM

## 2014-01-04 NOTE — Patient Instructions (Signed)
Seen for ingrown nail. All nails debrided. Callus trimmed from ball of right foot. Casted for Orthotics. Will call patient when they are ready.

## 2014-01-04 NOTE — Progress Notes (Signed)
Subjective:  38 year old male presents stating that he has ingrown nail and need the nail removed. Also wants to have orthotic shoe inserts.   Objective:  Dermatologic:  Hypertrophic nail with painful ingrown nail right hallux. Plantar callus under the 2nd MPJ right, painful.  Vascular: Pedal pulses are all faintly palpable.  Neurologic: Subjective numbness and tingling on both feet. Failed to respond to Monofilament sensory testing.  Orthopedic: Hypermobile first ray bilateral. Plantar flexed 2nd and 4 th MPJ right, elevated first and 5 th Metatarsal right.  Radiographic examination reveal ols surgery bunion resection and 5th met osteotomy with screw. Elevated first ray left, CYMA line anterior break bilateral, plantar heel spur bilateral.   Assessment: Painful ingrown nail right hallux. 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  Painful callus under 2nd MPJ right.  Diabetic neuropathy.  Deformed Metatarsal bilateral.  Hyperpronation STJ bilateral.   Plan:  Reviewed clinical findings and available options. Debrided painful callus from right foot.  Continue to keep padded shoe liner in place of Orthotics.  Return as needed.

## 2014-01-21 ENCOUNTER — Other Ambulatory Visit: Payer: Self-pay | Admitting: Internal Medicine

## 2014-01-21 ENCOUNTER — Other Ambulatory Visit: Payer: Self-pay | Admitting: Family Medicine

## 2014-01-23 ENCOUNTER — Ambulatory Visit: Payer: No Typology Code available for payment source | Attending: Orthopedic Surgery | Admitting: Physical Therapy

## 2014-01-24 ENCOUNTER — Ambulatory Visit: Payer: No Typology Code available for payment source | Admitting: Physical Therapy

## 2014-03-31 ENCOUNTER — Emergency Department (HOSPITAL_BASED_OUTPATIENT_CLINIC_OR_DEPARTMENT_OTHER): Payer: BC Managed Care – PPO

## 2014-03-31 ENCOUNTER — Emergency Department (HOSPITAL_BASED_OUTPATIENT_CLINIC_OR_DEPARTMENT_OTHER)
Admission: EM | Admit: 2014-03-31 | Discharge: 2014-03-31 | Disposition: A | Discharge status: Home / Self Care | Payer: BC Managed Care – PPO | Attending: Emergency Medicine | Admitting: Emergency Medicine

## 2014-03-31 ENCOUNTER — Encounter (HOSPITAL_BASED_OUTPATIENT_CLINIC_OR_DEPARTMENT_OTHER): Payer: Self-pay | Admitting: Emergency Medicine

## 2014-03-31 DIAGNOSIS — M79609 Pain in unspecified limb: Secondary | ICD-10-CM | POA: Insufficient documentation

## 2014-03-31 DIAGNOSIS — R110 Nausea: Secondary | ICD-10-CM | POA: Insufficient documentation

## 2014-03-31 DIAGNOSIS — M5137 Other intervertebral disc degeneration, lumbosacral region: Secondary | ICD-10-CM | POA: Insufficient documentation

## 2014-03-31 DIAGNOSIS — Z794 Long term (current) use of insulin: Secondary | ICD-10-CM | POA: Insufficient documentation

## 2014-03-31 DIAGNOSIS — Z7982 Long term (current) use of aspirin: Secondary | ICD-10-CM | POA: Insufficient documentation

## 2014-03-31 DIAGNOSIS — G4733 Obstructive sleep apnea (adult) (pediatric): Secondary | ICD-10-CM | POA: Insufficient documentation

## 2014-03-31 DIAGNOSIS — M79671 Pain in right foot: Secondary | ICD-10-CM

## 2014-03-31 DIAGNOSIS — L84 Corns and callosities: Secondary | ICD-10-CM | POA: Insufficient documentation

## 2014-03-31 DIAGNOSIS — IMO0002 Reserved for concepts with insufficient information to code with codable children: Secondary | ICD-10-CM | POA: Insufficient documentation

## 2014-03-31 DIAGNOSIS — K219 Gastro-esophageal reflux disease without esophagitis: Secondary | ICD-10-CM | POA: Insufficient documentation

## 2014-03-31 DIAGNOSIS — Z87442 Personal history of urinary calculi: Secondary | ICD-10-CM | POA: Insufficient documentation

## 2014-03-31 DIAGNOSIS — Z79899 Other long term (current) drug therapy: Secondary | ICD-10-CM | POA: Insufficient documentation

## 2014-03-31 DIAGNOSIS — E119 Type 2 diabetes mellitus without complications: Secondary | ICD-10-CM | POA: Insufficient documentation

## 2014-03-31 DIAGNOSIS — R739 Hyperglycemia, unspecified: Secondary | ICD-10-CM

## 2014-03-31 DIAGNOSIS — G5760 Lesion of plantar nerve, unspecified lower limb: Secondary | ICD-10-CM | POA: Insufficient documentation

## 2014-03-31 DIAGNOSIS — M1990 Unspecified osteoarthritis, unspecified site: Secondary | ICD-10-CM | POA: Insufficient documentation

## 2014-03-31 DIAGNOSIS — E785 Hyperlipidemia, unspecified: Secondary | ICD-10-CM | POA: Insufficient documentation

## 2014-03-31 DIAGNOSIS — Z87891 Personal history of nicotine dependence: Secondary | ICD-10-CM | POA: Insufficient documentation

## 2014-03-31 DIAGNOSIS — F319 Bipolar disorder, unspecified: Secondary | ICD-10-CM | POA: Insufficient documentation

## 2014-03-31 DIAGNOSIS — B370 Candidal stomatitis: Secondary | ICD-10-CM | POA: Insufficient documentation

## 2014-03-31 DIAGNOSIS — I10 Essential (primary) hypertension: Secondary | ICD-10-CM | POA: Insufficient documentation

## 2014-03-31 DIAGNOSIS — M51379 Other intervertebral disc degeneration, lumbosacral region without mention of lumbar back pain or lower extremity pain: Secondary | ICD-10-CM | POA: Insufficient documentation

## 2014-03-31 DIAGNOSIS — J45909 Unspecified asthma, uncomplicated: Secondary | ICD-10-CM | POA: Insufficient documentation

## 2014-03-31 DIAGNOSIS — G609 Hereditary and idiopathic neuropathy, unspecified: Secondary | ICD-10-CM | POA: Insufficient documentation

## 2014-03-31 LAB — URINALYSIS, ROUTINE W REFLEX MICROSCOPIC
Bilirubin Urine: NEGATIVE
Hgb urine dipstick: NEGATIVE
Ketones, ur: 15 mg/dL — AB
Leukocytes, UA: NEGATIVE
Nitrite: NEGATIVE
PH: 6 (ref 5.0–8.0)
Protein, ur: NEGATIVE mg/dL
Specific Gravity, Urine: 1.038 — ABNORMAL HIGH (ref 1.005–1.030)
Urobilinogen, UA: 0.2 mg/dL (ref 0.0–1.0)

## 2014-03-31 LAB — I-STAT VENOUS BLOOD GAS, ED
ACID-BASE EXCESS: 3 mmol/L — AB (ref 0.0–2.0)
BICARBONATE: 28.2 meq/L — AB (ref 20.0–24.0)
O2 Saturation: 74 %
PO2 VEN: 38 mmHg (ref 30.0–45.0)
Patient temperature: 98.1
TCO2: 30 mmol/L (ref 0–100)
pCO2, Ven: 42.7 mmHg — ABNORMAL LOW (ref 45.0–50.0)
pH, Ven: 7.427 — ABNORMAL HIGH (ref 7.250–7.300)

## 2014-03-31 LAB — CBC WITH DIFFERENTIAL/PLATELET
Basophils Absolute: 0.1 10*3/uL (ref 0.0–0.1)
Basophils Relative: 1 % (ref 0–1)
Eosinophils Absolute: 0.1 10*3/uL (ref 0.0–0.7)
Eosinophils Relative: 2 % (ref 0–5)
HEMATOCRIT: 36.5 % — AB (ref 39.0–52.0)
Hemoglobin: 13.4 g/dL (ref 13.0–17.0)
LYMPHS ABS: 1.6 10*3/uL (ref 0.7–4.0)
Lymphocytes Relative: 37 % (ref 12–46)
MCH: 32.4 pg (ref 26.0–34.0)
MCHC: 36.7 g/dL — ABNORMAL HIGH (ref 30.0–36.0)
MCV: 88.4 fL (ref 78.0–100.0)
MONO ABS: 0.5 10*3/uL (ref 0.1–1.0)
MONOS PCT: 12 % (ref 3–12)
Neutro Abs: 2 10*3/uL (ref 1.7–7.7)
Neutrophils Relative %: 48 % (ref 43–77)
Platelets: 147 10*3/uL — ABNORMAL LOW (ref 150–400)
RBC: 4.13 MIL/uL — AB (ref 4.22–5.81)
RDW: 12.6 % (ref 11.5–15.5)
WBC: 4.3 10*3/uL (ref 4.0–10.5)

## 2014-03-31 LAB — BASIC METABOLIC PANEL
Anion gap: 15 (ref 5–15)
BUN: 18 mg/dL (ref 6–23)
CALCIUM: 9.6 mg/dL (ref 8.4–10.5)
CO2: 23 meq/L (ref 19–32)
Chloride: 93 mEq/L — ABNORMAL LOW (ref 96–112)
Creatinine, Ser: 1.2 mg/dL (ref 0.50–1.35)
GFR calc Af Amer: 87 mL/min — ABNORMAL LOW (ref >90)
GFR calc non Af Amer: 75 mL/min — ABNORMAL LOW (ref >90)
GLUCOSE: 741 mg/dL — AB (ref 70–99)
Potassium: 4.6 mEq/L (ref 3.7–5.3)
SODIUM: 131 meq/L — AB (ref 137–147)

## 2014-03-31 LAB — CBG MONITORING, ED
GLUCOSE-CAPILLARY: 535 mg/dL — AB (ref 70–99)
GLUCOSE-CAPILLARY: 459 mg/dL — AB (ref 70–99)
Glucose-Capillary: >600 mg/dL (ref 70–99)

## 2014-03-31 LAB — URINE MICROSCOPIC-ADD ON: URINE-OTHER: NONE SEEN

## 2014-03-31 MED ORDER — ONDANSETRON HCL 4 MG/2ML IJ SOLN
4.0000 mg | Freq: Once | INTRAMUSCULAR | Status: AC
  Administered 2014-03-31: 4 mg via INTRAVENOUS
  Filled 2014-03-31: qty 2

## 2014-03-31 MED ORDER — SODIUM CHLORIDE 0.9 % IV BOLUS (SEPSIS)
1000.0000 mL | Freq: Once | INTRAVENOUS | Status: AC
  Administered 2014-03-31: 1000 mL via INTRAVENOUS

## 2014-03-31 MED ORDER — FREESTYLE SYSTEM KIT: 1.0000 | PACK | Status: DC | PRN

## 2014-03-31 MED ORDER — METFORMIN HCL 1000 MG PO TABS: 1000.0000 mg | ORAL_TABLET | Freq: Two times a day (BID) | ORAL | Status: DC

## 2014-03-31 MED ORDER — METFORMIN HCL 500 MG PO TABS
1000.0000 mg | ORAL_TABLET | Freq: Once | ORAL | Status: AC
  Administered 2014-03-31: 1000 mg via ORAL
  Filled 2014-03-31: qty 2

## 2014-03-31 NOTE — ED Notes (Signed)
Patient transported to X-ray 

## 2014-03-31 NOTE — ED Notes (Signed)
I took CBG reading and got 535 mg./dcltr.

## 2014-03-31 NOTE — ED Provider Notes (Signed)
CSN: [id number]     Arrival date & time 03/31/14  1940 History   This chart was scribed for Wayne Camel, MD by Julian Hy, ED Scribe. The patient was seen in MH06/[facility]06. The patient's care was started at 9:07 PM.     Chief Complaint  Patient presents with  . Hyperglycemia  . Wound Infection   The history is provided by the patient. No language interpreter was used.   HPI Comments: Wayne [name] is a 38 y.o. male who presents to the Emergency Department complaining of new, gradually worsening right foot wound onset one week ago. Pt notes he had a callus on his left foot that was cleaned at the [facility] that originally started one month ago that has now returned.. Pt denies having drainage or swelling. Pt denies stepping on glass or injury.  Pt lost his insurance and did not take insulin, metformin, or glucotrol. Pt used insulin one day ago but denies having metformin or glucotrol. Pt had about one month of blood sugar that was 200+. Pt has associated indigestion, thrush and nausea. Pt last had thrush last week. Pt denies vomiting. Pt denies having a PCP.  Past Medical History  Diagnosis Date  . Depression   . Diabetes mellitus   . Hyperlipidemia   . HTN (hypertension)   . Chronic headaches   . Osteoarthritis   . Kidney stones   . Polysubstance abuse     cocaine, marijuana-quit summer 2010  . Tobacco abuse   . Asthma   . Chest pain   . Lumbar disc disease   . GERD (gastroesophageal reflux disease)   . OSA (obstructive sleep apnea)   . Peripheral neuropathy 02/01/2011  . Bipolar 1 disorder   . Ingrown nail 11/12/2013   Past Surgical History  Procedure Laterality Date  . Knee surgery    . Bunionectomy  06-2009   Family History  Problem Relation Age of Onset  . Heart disease Mother   . Diabetes Father   . Hyperlipidemia Father   . Emphysema Maternal Uncle   . Asthma Sister   . Asthma Mother   . Stroke Mother   . Hypertension Father   . Alzheimer's disease  Maternal Grandmother   . Depression Father   . Breast cancer Maternal Aunt   . COPD Mother     maternal un   History  Substance Use Topics  . Smoking status: Former Smoker -- 0.20 packs/day for 17 years    Types: Cigarettes    Quit date: 02/24/2011  . Smokeless tobacco: Former Neurosurgeon  . Alcohol Use: Yes     Comment: socially    Review of Systems  Gastrointestinal: Positive for nausea. Negative for vomiting.  Endocrine: Negative for polydipsia, polyphagia and polyuria.  Musculoskeletal: Positive for arthralgias.  Skin: Positive for wound.  All other systems reviewed and are negative.     Allergies  Iodine; Lipitor; Nystatin; and Statins  Home Medications   Prior to Admission medications   Medication Sig Start Date End Date Taking? Authorizing Provider  ABILIFY 10 MG tablet Take 10 mg by mouth 2 (two) times daily.  10/25/13   Historical Provider, MD  albuterol (PROVENTIL HFA;VENTOLIN HFA) 108 (90 BASE) MCG/ACT inhaler Inhale 2 puffs into the lungs every 6 (six) hours as needed. For shortness of breath    Historical Provider, MD  alum & mag hydroxide-simeth (MAALOX/MYLANTA) 200-200-20 MG/5ML suspension Take 15 mLs by mouth every 6 (six) hours as needed. For diarrhea  Historical Provider, MD  aspirin EC 81 MG tablet Take 81 mg by mouth daily.     Historical Provider, MD  atenolol (TENORMIN) 25 MG tablet Take 25 mg by mouth daily.  11/06/13   Historical Provider, MD  budesonide-formoterol (SYMBICORT) 80-4.5 MCG/ACT inhaler Inhale 2 puffs into the lungs 2 (two) times daily.    Historical Provider, MD  buPROPion (WELLBUTRIN) 100 MG tablet Take 100 mg by mouth 2 (two) times daily.    Historical Provider, MD  Efinaconazole (JUBLIA) 10 % SOLN Apply 1 applicator topically every morning. 11/12/13   Myeong Sheard, DPM  ezetimibe (ZETIA) 10 MG tablet Take 1 tablet (10 mg total) by mouth daily. 03/26/13   [name], MD  fluticasone (FLONASE) 50 MCG/ACT nasal spray Place 2 sprays into both  nostrils daily.  11/05/13   Historical Provider, MD  gabapentin (NEURONTIN) 300 MG capsule Take 4 capsules (1,200 mg total) by mouth 2 (two) times daily. 06/18/13   Elenora Gamma, MD  glipiZIDE (GLUCOTROL XL) 10 MG 24 hr tablet 1 tab per day for blood sugar more than 200 05/29/12   John-Adam Bonk, MD  Glucosamine 500 MG TABS Take 1 tablet by mouth daily.     Historical Provider, MD  HYDROcodone-acetaminophen (NORCO) 5-325 MG per tablet Take 1-2 tablets by mouth every 6 (six) hours as needed for severe pain. 11/18/13   Doug Sou, MD  ibuprofen (ADVIL,MOTRIN) 200 MG tablet Take 600 mg by mouth every 8 (eight) hours as needed for mild pain.    Historical Provider, MD  Insulin Detemir (LEVEMIR FLEXPEN Gregory) Inject into the skin.    Historical Provider, MD  insulin glargine (LANTUS) 100 UNIT/ML injection Inject 45 Units into the skin at bedtime. 05/29/12 08/17/13  John-Adam Bonk, MD  insulin lispro (HUMALOG) 100 UNIT/ML injection Inject 10 Units into the skin 3 (three) times daily before meals. 05/29/12 08/17/13  John-Adam Bonk, MD  INVOKANA 100 MG TABS Take 100 mg by mouth daily.  11/06/13   Historical Provider, MD  lidocaine (XYLOCAINE) 2 % solution Use as directed in the mouth or throat 2 (two) times daily.    Historical Provider, MD  lisinopril (PRINIVIL,ZESTRIL) 20 MG tablet Take 20 mg by mouth daily. 04/19/11 08/17/13  Corwin Levins, MD  loratadine (CLARITIN) 10 MG tablet Take 1 tablet (10 mg total) by mouth daily. 07/30/11 07/29/12  Maryruth Bun Rama, MD  losartan (COZAAR) 50 MG tablet Take 50 mg by mouth daily.  11/05/13   Historical Provider, MD  metFORMIN (GLUCOPHAGE) 1000 MG tablet Take 1,000 mg by mouth 2 (two) times daily with a meal.  11/06/13   Historical Provider, MD  methocarbamol (ROBAXIN) 500 MG tablet  12/10/13   Historical Provider, MD  NEXIUM 40 MG capsule Take 40 mg by mouth daily.  11/05/13   Historical Provider, MD  NOVOLOG MIX 70/30 FLEXPEN (70-30) 100 UNIT/ML Pen Inject 25 Units into the  skin 2 (two) times daily.  11/05/13   Historical Provider, MD  sertraline (ZOLOFT) 100 MG tablet Take 2 tablets (200 mg total) by mouth daily. 10/04/11 08/17/13  Glynn Octave, MD  sertraline (ZOLOFT) 100 MG tablet Take 150 mg by mouth daily.    Historical Provider, MD   Triage Vitals: BP 114/70  Pulse 103  Temp(Src) 98.1 F (36.7 C) (Oral)  Resp 18  Ht  (1.854 m)  Wt 165 lb (74.844 kg)  BMI 21.77 kg/m2  SpO2 100% Physical Exam  Nursing note and vitals reviewed. Constitutional:  He is oriented to person, place, and time. He appears well-developed and well-nourished. No distress.  HENT:  Head: Normocephalic and atraumatic.  Cardiovascular: Normal rate, regular rhythm and normal heart sounds.   Pulmonary/Chest: Effort normal and breath sounds normal. No respiratory distress.  Musculoskeletal: Normal range of motion.  Normal DP pulses. On the plantar aspect he has a 1-2cm of thickening skin consistent with a callus with mild tenderness. No wound, ulceration, drainage, or cellulitis.    Neurological: He is alert and oriented to person, place, and time.  Skin: Skin is warm and dry.  Psychiatric: He has a normal mood and affect. His behavior is normal.    ED Course  Procedures (including critical care time) DIAGNOSTIC STUDIES: Oxygen Saturation is 100% on RA, normal by my interpretation.    COORDINATION OF CARE: 10:02 PM- Patient informed of current plan for treatment and evaluation and agrees with plan at this time.    Labs Review Labs Reviewed  CBC WITH DIFFERENTIAL - Abnormal; Notable for the following:    RBC 4.13 (*)    HCT 36.5 (*)    MCHC 36.7 (*)    Platelets 147 (*)    All other components within normal limits  BASIC METABOLIC PANEL - Abnormal; Notable for the following:    Sodium 131 (*)    Chloride 93 (*)    Glucose, Bld 741 (*)    GFR calc non Af Amer 75 (*)    GFR calc Af Amer 87 (*)    All other components within normal limits  URINALYSIS, ROUTINE W  REFLEX MICROSCOPIC - Abnormal; Notable for the following:    Specific Gravity, Urine 1.038 (*)    Glucose, UA >1000 (*)    Ketones, ur 15 (*)    All other components within normal limits  CBG MONITORING, ED - Abnormal; Notable for the following:    Glucose-Capillary >600 (*)    All other components within normal limits  I-STAT VENOUS BLOOD GAS, ED - Abnormal; Notable for the following:    pH, Ven 7.427 (*)    pCO2, Ven 42.7 (*)    Bicarbonate 28.2 (*)    Acid-Base Excess 3.0 (*)    All other components within normal limits  CBG MONITORING, ED - Abnormal; Notable for the following:    Glucose-Capillary 535 (*)    All other components within normal limits  CBG MONITORING, ED - Abnormal; Notable for the following:    Glucose-Capillary 459 (*)    All other components within normal limits  URINE MICROSCOPIC-ADD ON    Imaging Review Dg Foot Complete Right 03/31/2014   CLINICAL DATA:  Infection in the right foot. Pain in drainage for 1 month. Callus.  EXAM: RIGHT FOOT COMPLETE - 3+ VIEW  COMPARISON:  Right fifth toe 02/17/2010  FINDINGS: Postoperative changes with healed osteotomy and screw fixation of the distal fifth metatarsal bone. Degenerative changes in the first metatarsal-phalangeal joint. No evidence of acute fracture or subluxation. No focal bone lesion or bone destruction. Bone cortex and trabecular architecture appear intact. No radiopaque soft tissue foreign bodies.  IMPRESSION: Postoperative and degenerative changes in the right foot. No acute bony abnormalities.   Electronically Signed   By: Burman Nieves M.D.   On: 03/31/2014 21:29    MDM   Final diagnoses:  Hyperglycemia  Right foot pain    Patient's foot apperas to have a recurrent callus, but no signs of ulceration or infection. Patient is hyperglycemic without evidence of DKA or acidosis.  Given fluids with decrease in glucose. Will restart on metformin, as patient states he can pay for it. He states he has his insulin  at home. Recommend PCP f/u to help control glucose and f/u with podiatry for his foot issues.  This chart was scribed in my presence and reviewed by me personally.   Wayne Camel, MD 04/01/14 [phone]

## 2014-03-31 NOTE — ED Notes (Signed)
Pt states that he usually checks his CBGs at home 2xdaily -- usually runs in the 110s. He ran out of insulin and PO diabetes meds 2 weeks ago. Reports that when this happens his CBGs will go into the 200s, but will be controlled again when he obtains his meds and insulin. CBG was in 200s yesterday but today it said "HIGH." Pt came to hospital today for wound on R foot. Danna Hefty, RN

## 2014-03-31 NOTE — ED Notes (Signed)
Pt reports infection to right foot, sts callus was scraped at foot MD. Reports high sugars today

## 2014-03-31 NOTE — ED Notes (Signed)
Lab called and states blood sugar was 741. MD aware.

## 2014-03-31 NOTE — ED Notes (Signed)
CBG 459 mg. / dcltr.

## 2014-03-31 NOTE — Discharge Instructions (Signed)
Hyperglycemia °Hyperglycemia occurs when the glucose (sugar) in your blood is too high. Hyperglycemia can happen for many reasons, but it most often happens to people who do not know they have diabetes or are not managing their diabetes properly.  °CAUSES  °Whether you have diabetes or not, there are other causes of hyperglycemia. Hyperglycemia can occur when you have diabetes, but it can also occur in other situations that you might not be as aware of, such as: °Diabetes °· If you have diabetes and are having problems controlling your blood glucose, hyperglycemia could occur because of some of the following reasons: °¨ Not following your meal plan. °¨ Not taking your diabetes medications or not taking it properly. °¨ Exercising less or doing less activity than you normally do. °¨ Being sick. °Pre-diabetes °· This cannot be ignored. Before people develop Type 2 diabetes, they almost always have "pre-diabetes." This is when your blood glucose levels are higher than normal, but not yet high enough to be diagnosed as diabetes. Research has shown that some long-term damage to the body, especially the heart and circulatory system, may already be occurring during pre-diabetes. If you take action to manage your blood glucose when you have pre-diabetes, you may delay or prevent Type 2 diabetes from developing. °Stress °· If you have diabetes, you may be "diet" controlled or on oral medications or insulin to control your diabetes. However, you may find that your blood glucose is higher than usual in the hospital whether you have diabetes or not. This is often referred to as "stress hyperglycemia." Stress can elevate your blood glucose. This happens because of hormones put out by the body during times of stress. If stress has been the cause of your high blood glucose, it can be followed regularly by your caregiver. That way he/she can make sure your hyperglycemia does not continue to get worse or progress to  diabetes. °Steroids °· Steroids are medications that act on the infection fighting system (immune system) to block inflammation or infection. One side effect can be a rise in blood glucose. Most people can produce enough extra insulin to allow for this rise, but for those who cannot, steroids make blood glucose levels go even higher. It is not unusual for steroid treatments to "uncover" diabetes that is developing. It is not always possible to determine if the hyperglycemia will go away after the steroids are stopped. A special blood test called an A1c is sometimes done to determine if your blood glucose was elevated before the steroids were started. °SYMPTOMS °· Thirsty. °· Frequent urination. °· Dry mouth. °· Blurred vision. °· Tired or fatigue. °· Weakness. °· Sleepy. °· Tingling in feet or leg. °DIAGNOSIS  °Diagnosis is made by monitoring blood glucose in one or all of the following ways: °· A1c test. This is a chemical found in your blood. °· Fingerstick blood glucose monitoring. °· Laboratory results. °TREATMENT  °First, knowing the cause of the hyperglycemia is important before the hyperglycemia can be treated. Treatment may include, but is not be limited to: °· Education. °· Change or adjustment in medications. °· Change or adjustment in meal plan. °· Treatment for an illness, infection, etc. °· More frequent blood glucose monitoring. °· Change in exercise plan. °· Decreasing or stopping steroids. °· Lifestyle changes. °HOME CARE INSTRUCTIONS  °· Test your blood glucose as directed. °· Exercise regularly. Your caregiver will give you instructions about exercise. Pre-diabetes or diabetes which comes on with stress is helped by exercising. °· Eat wholesome,   balanced meals. Eat often and at regular, fixed times. Your caregiver or nutritionist will give you a meal plan to guide your sugar intake.  Being at an ideal weight is important. If needed, losing as little as 10 to 15 pounds may help improve blood  glucose levels. SEEK MEDICAL CARE IF:   You have questions about medicine, activity, or diet.  You continue to have symptoms (problems such as increased thirst, urination, or weight gain). SEEK IMMEDIATE MEDICAL CARE IF:   You are vomiting or have diarrhea.  Your breath smells fruity.  You are breathing faster or slower.  You are very sleepy or incoherent.  You have numbness, tingling, or pain in your feet or hands.  You have chest pain.  Your symptoms get worse even though you have been following your caregiver's orders.  If you have any other questions or concerns. Document Released: 01/05/2001 Document Revised: 10/04/2011 Document Reviewed: 11/08/2011 [facility] Patient Information 2015 [place], [place]. This information is not intended to replace advice given to you by your health care provider. Make sure you discuss any questions you have with your health care provider.   Diabetes and Foot Care Diabetes may cause you to have problems because of poor blood supply (circulation) to your feet and legs. This may cause the skin on your feet to become thinner, break easier, and heal more slowly. Your skin may become dry, and the skin may peel and crack. You may also have nerve damage in your legs and feet causing decreased feeling in them. You may not notice minor injuries to your feet that could lead to infections or more serious problems. Taking care of your feet is one of the most important things you can do for yourself.  HOME CARE INSTRUCTIONS  Wear shoes at all times, even in the house. Do not go barefoot. Bare feet are easily injured.  Check your feet daily for blisters, cuts, and redness. If you cannot see the bottom of your feet, use a mirror or ask someone for help.  Wash your feet with warm water (do not use hot water) and mild soap. Then pat your feet and the areas between your toes until they are completely dry. Do not soak your feet as this can dry your skin.  Apply a  moisturizing lotion or petroleum jelly (that does not contain alcohol and is unscented) to the skin on your feet and to dry, brittle toenails. Do not apply lotion between your toes.  Trim your toenails straight across. Do not dig under them or around the cuticle. File the edges of your nails with an emery board or nail file.  Do not cut corns or calluses or try to remove them with medicine.  Wear clean socks or stockings every day. Make sure they are not too tight. Do not wear knee-high stockings since they may decrease blood flow to your legs.  Wear shoes that fit properly and have enough cushioning. To break in new shoes, wear them for just a few hours a day. This prevents you from injuring your feet. Always look in your shoes before you put them on to be sure there are no objects inside.  Do not cross your legs. This may decrease the blood flow to your feet.  If you find a minor scrape, cut, or break in the skin on your feet, keep it and the skin around it clean and dry. These areas may be cleansed with mild soap and water. Do not cleanse the area  with peroxide, alcohol, or iodine.  When you remove an adhesive bandage, be sure not to damage the skin around it.  If you have a wound, look at it several times a day to make sure it is healing.  Do not use heating pads or hot water bottles. They may burn your skin. If you have lost feeling in your feet or legs, you may not know it is happening until it is too late.  Make sure your health care provider performs a complete foot exam at least annually or more often if you have foot problems. Report any cuts, sores, or bruises to your health care provider immediately. SEEK MEDICAL CARE IF:   You have an injury that is not healing.  You have cuts or breaks in the skin.  You have an ingrown nail.  You notice redness on your legs or feet.  You feel burning or tingling in your legs or feet.  You have pain or cramps in your legs and  feet.  Your legs or feet are numb.  Your feet always feel cold. SEEK IMMEDIATE MEDICAL CARE IF:   There is increasing redness, swelling, or pain in or around a wound.  There is a red line that goes up your leg.  Pus is coming from a wound.  You develop a fever or as directed by your health care provider.  You notice a bad smell coming from an ulcer or wound. Document Released: 07/09/2000 Document Revised: 03/14/2013 Document Reviewed: 12/19/2012 [facility] Patient Information 2015 [place], [place]. This information is not intended to replace advice given to you by your health care provider. Make sure you discuss any questions you have with your health care provider.

## 2014-05-14 ENCOUNTER — Ambulatory Visit: Payer: BC Managed Care – PPO | Admitting: Podiatry

## 2014-05-15 ENCOUNTER — Ambulatory Visit (INDEPENDENT_AMBULATORY_CARE_PROVIDER_SITE_OTHER): Payer: BC Managed Care – PPO | Admitting: Podiatry

## 2014-05-15 ENCOUNTER — Encounter: Payer: Self-pay | Admitting: Podiatry

## 2014-05-15 VITALS — BP 126/91 | HR 104 | Ht 73.0 in | Wt 160.0 lb

## 2014-05-15 DIAGNOSIS — M21961 Unspecified acquired deformity of right lower leg: Secondary | ICD-10-CM

## 2014-05-15 DIAGNOSIS — L570 Actinic keratosis: Secondary | ICD-10-CM | POA: Insufficient documentation

## 2014-05-15 DIAGNOSIS — M79604 Pain in right leg: Secondary | ICD-10-CM

## 2014-05-15 NOTE — Progress Notes (Signed)
Subjective:  38 year old male presents stating that he has painful callus on right foot.   Objective:  Dermatologic:  Large plantar callus under the 2nd MPJ right, 2cm in diameter with subdermal bleeding, painful.  Vascular: Pedal pulses are all faintly palpable.  Neurologic: Subjective numbness and tingling on both feet. Failed to respond to Monofilament sensory testing.  Orthopedic: Hypermobile first ray bilateral. Plantar flexed 2nd and 4 th MPJ right, elevated first and 5 th Metatarsal right.   Assessment: Painful callus right foot under 2nd MPJ. Diabetic neuropathy.  Deformed Metatarsal bilateral.  Hyperpronation STJ bilateral.   Plan:  Reviewed clinical findings and available options. Debrided painful callus from right foot.

## 2014-05-15 NOTE — Patient Instructions (Signed)
Seen for painful callus right foot. Debrided. Pain relieved. Return in 2 months.

## 2014-06-12 DIAGNOSIS — J449 Chronic obstructive pulmonary disease, unspecified: Secondary | ICD-10-CM | POA: Insufficient documentation

## 2014-06-12 DIAGNOSIS — S91339A Puncture wound without foreign body, unspecified foot, initial encounter: Secondary | ICD-10-CM | POA: Insufficient documentation

## 2014-06-12 DIAGNOSIS — F329 Major depressive disorder, single episode, unspecified: Secondary | ICD-10-CM | POA: Insufficient documentation

## 2014-06-12 DIAGNOSIS — E1142 Type 2 diabetes mellitus with diabetic polyneuropathy: Secondary | ICD-10-CM | POA: Insufficient documentation

## 2014-06-12 DIAGNOSIS — F32A Depression, unspecified: Secondary | ICD-10-CM | POA: Insufficient documentation

## 2014-07-16 ENCOUNTER — Encounter: Payer: Self-pay | Admitting: Podiatry

## 2014-07-16 ENCOUNTER — Ambulatory Visit (INDEPENDENT_AMBULATORY_CARE_PROVIDER_SITE_OTHER): Payer: BC Managed Care – PPO | Admitting: Podiatry

## 2014-07-16 VITALS — BP 119/76 | HR 97

## 2014-07-16 DIAGNOSIS — M21961 Unspecified acquired deformity of right lower leg: Secondary | ICD-10-CM

## 2014-07-16 DIAGNOSIS — L570 Actinic keratosis: Secondary | ICD-10-CM

## 2014-07-16 DIAGNOSIS — M79604 Pain in right leg: Secondary | ICD-10-CM

## 2014-07-16 NOTE — Progress Notes (Signed)
Subjective:  38 year old male presents stating that he has painful callus on right foot.   Objective:  Dermatologic:  Large plantar callus under the 2nd MPJ right, 2cm in diameter with subdermal bleeding, painful. Subdermal bleeding noted.  Vascular: Pedal pulses are all faintly palpable.  Neurologic: Subjective numbness and tingling on both feet. Failed to respond to Monofilament sensory testing.  Orthopedic: Hypermobile first ray bilateral. Plantar flexed 2nd and 4 th MPJ right, elevated first and 5 th Metatarsal right.   Assessment: Painful callus right foot under 2nd MPJ, pre ulcerative. Diabetic neuropathy.  Deformed Metatarsal bilateral.  Hyperpronation STJ bilateral.   Plan:  Reviewed clinical findings and available options. Lesion debrided and padded. Return in 6 weeks or sooner if needed.  Debrided painful callus from right foot.

## 2014-07-16 NOTE — Patient Instructions (Signed)
Painful callus on right foot debrided. Callus is pre ulcerative. Return in 6 weeks.

## 2014-08-04 ENCOUNTER — Emergency Department (HOSPITAL_COMMUNITY)
Admission: EM | Admit: 2014-08-04 | Discharge: 2014-08-05 | Disposition: A | Discharge status: Home / Self Care | Payer: BLUE CROSS/BLUE SHIELD | Attending: Emergency Medicine | Admitting: Emergency Medicine

## 2014-08-04 ENCOUNTER — Encounter (HOSPITAL_COMMUNITY): Payer: Self-pay | Admitting: Emergency Medicine

## 2014-08-04 DIAGNOSIS — Z7982 Long term (current) use of aspirin: Secondary | ICD-10-CM | POA: Diagnosis not present

## 2014-08-04 DIAGNOSIS — E1165 Type 2 diabetes mellitus with hyperglycemia: Secondary | ICD-10-CM | POA: Insufficient documentation

## 2014-08-04 DIAGNOSIS — G629 Polyneuropathy, unspecified: Secondary | ICD-10-CM | POA: Insufficient documentation

## 2014-08-04 DIAGNOSIS — R45851 Suicidal ideations: Secondary | ICD-10-CM | POA: Insufficient documentation

## 2014-08-04 DIAGNOSIS — I10 Essential (primary) hypertension: Secondary | ICD-10-CM | POA: Diagnosis not present

## 2014-08-04 DIAGNOSIS — R45850 Homicidal ideations: Secondary | ICD-10-CM | POA: Diagnosis not present

## 2014-08-04 DIAGNOSIS — Z87442 Personal history of urinary calculi: Secondary | ICD-10-CM | POA: Diagnosis not present

## 2014-08-04 DIAGNOSIS — E785 Hyperlipidemia, unspecified: Secondary | ICD-10-CM | POA: Diagnosis not present

## 2014-08-04 DIAGNOSIS — Z79899 Other long term (current) drug therapy: Secondary | ICD-10-CM | POA: Insufficient documentation

## 2014-08-04 DIAGNOSIS — Z872 Personal history of diseases of the skin and subcutaneous tissue: Secondary | ICD-10-CM | POA: Insufficient documentation

## 2014-08-04 DIAGNOSIS — Z87891 Personal history of nicotine dependence: Secondary | ICD-10-CM | POA: Insufficient documentation

## 2014-08-04 DIAGNOSIS — M1990 Unspecified osteoarthritis, unspecified site: Secondary | ICD-10-CM | POA: Insufficient documentation

## 2014-08-04 DIAGNOSIS — J45909 Unspecified asthma, uncomplicated: Secondary | ICD-10-CM | POA: Insufficient documentation

## 2014-08-04 DIAGNOSIS — Z7951 Long term (current) use of inhaled steroids: Secondary | ICD-10-CM | POA: Insufficient documentation

## 2014-08-04 DIAGNOSIS — K219 Gastro-esophageal reflux disease without esophagitis: Secondary | ICD-10-CM | POA: Insufficient documentation

## 2014-08-04 DIAGNOSIS — R739 Hyperglycemia, unspecified: Secondary | ICD-10-CM

## 2014-08-04 DIAGNOSIS — Z794 Long term (current) use of insulin: Secondary | ICD-10-CM | POA: Insufficient documentation

## 2014-08-04 DIAGNOSIS — Z791 Long term (current) use of non-steroidal anti-inflammatories (NSAID): Secondary | ICD-10-CM | POA: Diagnosis not present

## 2014-08-04 LAB — CBC
HEMATOCRIT: 37.9 % — AB (ref 39.0–52.0)
Hemoglobin: 12.9 g/dL — ABNORMAL LOW (ref 13.0–17.0)
MCH: 30.4 pg (ref 26.0–34.0)
MCHC: 34 g/dL (ref 30.0–36.0)
MCV: 89.2 fL (ref 78.0–100.0)
Platelets: 182 10*3/uL (ref 150–400)
RBC: 4.25 MIL/uL (ref 4.22–5.81)
RDW: 12.4 % (ref 11.5–15.5)
WBC: 4.5 10*3/uL (ref 4.0–10.5)

## 2014-08-04 LAB — COMPREHENSIVE METABOLIC PANEL
ALBUMIN: 4.1 g/dL (ref 3.5–5.2)
ALT: 21 U/L (ref 0–53)
AST: 24 U/L (ref 0–37)
Alkaline Phosphatase: 241 U/L — ABNORMAL HIGH (ref 39–117)
Anion gap: 9 (ref 5–15)
BUN: 18 mg/dL (ref 6–23)
CO2: 29 mmol/L (ref 19–32)
CREATININE: 1.37 mg/dL — AB (ref 0.50–1.35)
Calcium: 9.5 mg/dL (ref 8.4–10.5)
Chloride: 96 mEq/L (ref 96–112)
GFR calc Af Amer: 74 mL/min — ABNORMAL LOW (ref >90)
GFR calc non Af Amer: 64 mL/min — ABNORMAL LOW (ref >90)
Glucose, Bld: 562 mg/dL (ref 70–99)
Potassium: 4.3 mmol/L (ref 3.5–5.1)
Sodium: 134 mmol/L — ABNORMAL LOW (ref 135–145)
Total Bilirubin: 0.9 mg/dL (ref 0.3–1.2)
Total Protein: 7.6 g/dL (ref 6.0–8.3)

## 2014-08-04 LAB — ETHANOL

## 2014-08-04 LAB — CBG MONITORING, ED: Glucose-Capillary: 540 mg/dL — ABNORMAL HIGH (ref 70–99)

## 2014-08-04 LAB — ACETAMINOPHEN LEVEL: Acetaminophen (Tylenol), Serum: <10 ug/mL — ABNORMAL LOW (ref 10–30)

## 2014-08-04 LAB — SALICYLATE LEVEL: Salicylate Lvl: <4 mg/dL (ref 2.8–20.0)

## 2014-08-04 MED ORDER — INSULIN ASPART 100 UNIT/ML ~~LOC~~ SOLN
8.0000 [IU] | Freq: Once | SUBCUTANEOUS | Status: AC
  Administered 2014-08-05: 8 [IU] via SUBCUTANEOUS
  Filled 2014-08-04: qty 1

## 2014-08-04 MED ORDER — IBUPROFEN 200 MG PO TABS
600.0000 mg | ORAL_TABLET | Freq: Three times a day (TID) | ORAL | Status: DC | PRN
  Administered 2014-08-05: 600 mg via ORAL
  Filled 2014-08-04: qty 3

## 2014-08-04 MED ORDER — SODIUM CHLORIDE 0.9 % IV SOLN
1000.0000 mL | Freq: Once | INTRAVENOUS | Status: AC
  Administered 2014-08-05: 1000 mL via INTRAVENOUS

## 2014-08-04 MED ORDER — SODIUM CHLORIDE 0.9 % IV SOLN
1000.0000 mL | INTRAVENOUS | Status: DC
  Administered 2014-08-05: 1000 mL via INTRAVENOUS
  Filled 2014-08-04: qty 1000

## 2014-08-04 MED ORDER — ALUM & MAG HYDROXIDE-SIMETH 200-200-20 MG/5ML PO SUSP: 30.0000 mL | ORAL | Status: DC | PRN

## 2014-08-04 MED ORDER — ONDANSETRON HCL 4 MG PO TABS: 4.0000 mg | ORAL_TABLET | Freq: Three times a day (TID) | ORAL | Status: DC | PRN

## 2014-08-04 MED ORDER — ZOLPIDEM TARTRATE 5 MG PO TABS: 5.0000 mg | ORAL_TABLET | Freq: Every evening | ORAL | Status: DC | PRN

## 2014-08-04 MED ORDER — INSULIN REGULAR HUMAN 100 UNIT/ML IJ SOLN: INTRAMUSCULAR | Status: DC

## 2014-08-04 MED ORDER — ACETAMINOPHEN 325 MG PO TABS: 650.0000 mg | ORAL_TABLET | ORAL | Status: DC | PRN

## 2014-08-04 MED ORDER — NICOTINE 21 MG/24HR TD PT24
21.0000 mg | MEDICATED_PATCH | Freq: Every day | TRANSDERMAL | Status: DC
  Administered 2014-08-05: 21 mg via TRANSDERMAL
  Filled 2014-08-04: qty 1

## 2014-08-04 MED ORDER — DEXTROSE-NACL 5-0.45 % IV SOLN: INTRAVENOUS | Status: DC

## 2014-08-04 MED ORDER — LORAZEPAM 1 MG PO TABS
1.0000 mg | ORAL_TABLET | Freq: Three times a day (TID) | ORAL | Status: DC | PRN
  Administered 2014-08-05: 1 mg via ORAL
  Filled 2014-08-04: qty 1

## 2014-08-04 NOTE — ED Notes (Signed)
Bed: WA02 Expected date:  Expected time:  Means of arrival:  Comments: TR3 
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
## 2014-08-04 NOTE — BH Assessment (Signed)
Received call for assessment. Spoke to Golden West FinancialErin O'Mailey, PA-C who said Pt is depressed due to diabetes out of control and conflict with niece. He reports suicidal ideation. Tele-assessment will be initiated.  Harlin RainFord Ellis Ria CommentWarrick Jr, LPC, [facility] Triage Specialist [phone]
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
## 2014-08-05 ENCOUNTER — Inpatient Hospital Stay (HOSPITAL_COMMUNITY)
Admission: AD | Admit: 2014-08-05 | Discharge: 2014-08-09 | DRG: 885 | Disposition: A | Discharge status: Home / Self Care | Payer: BLUE CROSS/BLUE SHIELD | Source: Intra-hospital | Attending: Psychiatry | Admitting: Psychiatry

## 2014-08-05 ENCOUNTER — Encounter (HOSPITAL_COMMUNITY): Payer: Self-pay

## 2014-08-05 DIAGNOSIS — Z794 Long term (current) use of insulin: Secondary | ICD-10-CM

## 2014-08-05 DIAGNOSIS — Z7982 Long term (current) use of aspirin: Secondary | ICD-10-CM

## 2014-08-05 DIAGNOSIS — F411 Generalized anxiety disorder: Secondary | ICD-10-CM | POA: Diagnosis present

## 2014-08-05 DIAGNOSIS — I10 Essential (primary) hypertension: Secondary | ICD-10-CM | POA: Diagnosis present

## 2014-08-05 DIAGNOSIS — R739 Hyperglycemia, unspecified: Secondary | ICD-10-CM | POA: Insufficient documentation

## 2014-08-05 DIAGNOSIS — E785 Hyperlipidemia, unspecified: Secondary | ICD-10-CM | POA: Diagnosis present

## 2014-08-05 DIAGNOSIS — G4700 Insomnia, unspecified: Secondary | ICD-10-CM | POA: Diagnosis present

## 2014-08-05 DIAGNOSIS — Z79899 Other long term (current) drug therapy: Secondary | ICD-10-CM

## 2014-08-05 DIAGNOSIS — K219 Gastro-esophageal reflux disease without esophagitis: Secondary | ICD-10-CM | POA: Diagnosis present

## 2014-08-05 DIAGNOSIS — R45851 Suicidal ideations: Secondary | ICD-10-CM | POA: Diagnosis present

## 2014-08-05 DIAGNOSIS — E1140 Type 2 diabetes mellitus with diabetic neuropathy, unspecified: Secondary | ICD-10-CM | POA: Diagnosis present

## 2014-08-05 DIAGNOSIS — F332 Major depressive disorder, recurrent severe without psychotic features: Secondary | ICD-10-CM | POA: Insufficient documentation

## 2014-08-05 DIAGNOSIS — R45850 Homicidal ideations: Secondary | ICD-10-CM | POA: Insufficient documentation

## 2014-08-05 DIAGNOSIS — E088 Diabetes mellitus due to underlying condition with unspecified complications: Secondary | ICD-10-CM

## 2014-08-05 LAB — RAPID URINE DRUG SCREEN, HOSP PERFORMED
AMPHETAMINES: NOT DETECTED
BARBITURATES: NOT DETECTED
Benzodiazepines: NOT DETECTED
Cocaine: POSITIVE — AB
Opiates: NOT DETECTED
TETRAHYDROCANNABINOL: NOT DETECTED

## 2014-08-05 LAB — GLUCOSE, CAPILLARY: GLUCOSE-CAPILLARY: 343 mg/dL — AB (ref 70–99)

## 2014-08-05 LAB — CBG MONITORING, ED
GLUCOSE-CAPILLARY: 165 mg/dL — AB (ref 70–99)
Glucose-Capillary: 268 mg/dL — ABNORMAL HIGH (ref 70–99)
Glucose-Capillary: 319 mg/dL — ABNORMAL HIGH (ref 70–99)
Glucose-Capillary: 322 mg/dL — ABNORMAL HIGH (ref 70–99)
Glucose-Capillary: 355 mg/dL — ABNORMAL HIGH (ref 70–99)
Glucose-Capillary: 378 mg/dL — ABNORMAL HIGH (ref 70–99)
Glucose-Capillary: 459 mg/dL — ABNORMAL HIGH (ref 70–99)

## 2014-08-05 MED ORDER — DEXTROSE-NACL 5-0.45 % IV SOLN: INTRAVENOUS | Status: DC

## 2014-08-05 MED ORDER — ASPIRIN EC 81 MG PO TBEC
81.0000 mg | DELAYED_RELEASE_TABLET | Freq: Every day | ORAL | Status: DC
  Administered 2014-08-05: 81 mg via ORAL
  Filled 2014-08-05 (×2): qty 1

## 2014-08-05 MED ORDER — FLUTICASONE PROPIONATE 50 MCG/ACT NA SUSP
2.0000 | Freq: Every day | NASAL | Status: DC
  Administered 2014-08-05: 2 via NASAL
  Filled 2014-08-05: qty 16

## 2014-08-05 MED ORDER — ALBUTEROL SULFATE HFA 108 (90 BASE) MCG/ACT IN AERS: 2.0000 | INHALATION_SPRAY | Freq: Four times a day (QID) | RESPIRATORY_TRACT | Status: DC | PRN

## 2014-08-05 MED ORDER — METFORMIN HCL 500 MG PO TABS
1000.0000 mg | ORAL_TABLET | Freq: Two times a day (BID) | ORAL | Status: DC
  Administered 2014-08-05: 1000 mg via ORAL
  Filled 2014-08-05 (×4): qty 2

## 2014-08-05 MED ORDER — INSULIN ASPART 100 UNIT/ML ~~LOC~~ SOLN
10.0000 [IU] | Freq: Three times a day (TID) | SUBCUTANEOUS | Status: DC
  Administered 2014-08-05: 10 [IU] via SUBCUTANEOUS
  Filled 2014-08-05 (×2): qty 1

## 2014-08-05 MED ORDER — GABAPENTIN 300 MG PO CAPS
300.0000 mg | ORAL_CAPSULE | Freq: Three times a day (TID) | ORAL | Status: DC
  Administered 2014-08-05 – 2014-08-06 (×2): 300 mg via ORAL
  Filled 2014-08-05 (×10): qty 1

## 2014-08-05 MED ORDER — FLUTICASONE PROPIONATE 50 MCG/ACT NA SUSP
2.0000 | Freq: Every day | NASAL | Status: DC
  Administered 2014-08-08 – 2014-08-09 (×2): 2 via NASAL
  Filled 2014-08-05: qty 16

## 2014-08-05 MED ORDER — PNEUMOCOCCAL VAC POLYVALENT 25 MCG/0.5ML IJ INJ
0.5000 mL | INJECTION | INTRAMUSCULAR | Status: AC
  Administered 2014-08-07: 0.5 mL via INTRAMUSCULAR

## 2014-08-05 MED ORDER — IBUPROFEN 600 MG PO TABS: 600.0000 mg | ORAL_TABLET | Freq: Three times a day (TID) | ORAL | Status: DC | PRN

## 2014-08-05 MED ORDER — LORATADINE 10 MG PO TABS
10.0000 mg | ORAL_TABLET | Freq: Every day | ORAL | Status: DC
  Administered 2014-08-06 – 2014-08-09 (×4): 10 mg via ORAL
  Filled 2014-08-05 (×6): qty 1

## 2014-08-05 MED ORDER — ALUM & MAG HYDROXIDE-SIMETH 200-200-20 MG/5ML PO SUSP: 30.0000 mL | ORAL | Status: DC | PRN

## 2014-08-05 MED ORDER — ZOLPIDEM TARTRATE 5 MG PO TABS
5.0000 mg | ORAL_TABLET | Freq: Every evening | ORAL | Status: DC | PRN
  Administered 2014-08-06: 5 mg via ORAL
  Filled 2014-08-05: qty 1

## 2014-08-05 MED ORDER — PANTOPRAZOLE SODIUM 40 MG PO TBEC
40.0000 mg | DELAYED_RELEASE_TABLET | Freq: Every day | ORAL | Status: DC
  Administered 2014-08-05: 40 mg via ORAL
  Filled 2014-08-05: qty 1

## 2014-08-05 MED ORDER — INSULIN GLARGINE 100 UNIT/ML ~~LOC~~ SOLN
25.0000 [IU] | Freq: Once | SUBCUTANEOUS | Status: AC
  Administered 2014-08-05: 25 [IU] via SUBCUTANEOUS
  Filled 2014-08-05: qty 0.25

## 2014-08-05 MED ORDER — ASPIRIN EC 81 MG PO TBEC
81.0000 mg | DELAYED_RELEASE_TABLET | Freq: Every day | ORAL | Status: DC
  Administered 2014-08-06 – 2014-08-09 (×4): 81 mg via ORAL
  Filled 2014-08-05 (×6): qty 1

## 2014-08-05 MED ORDER — SODIUM CHLORIDE 0.9 % IV SOLN
INTRAVENOUS | Status: DC
  Administered 2014-08-05: 5.2 [IU]/h via INTRAVENOUS
  Filled 2014-08-05: qty 2.5

## 2014-08-05 MED ORDER — SODIUM CHLORIDE 0.9 % IV SOLN: INTRAVENOUS | Status: DC

## 2014-08-05 MED ORDER — GLUCOSAMINE 500 MG PO TABS: 1.0000 | ORAL_TABLET | Freq: Every day | ORAL | Status: DC

## 2014-08-05 MED ORDER — EZETIMIBE 10 MG PO TABS
10.0000 mg | ORAL_TABLET | Freq: Every day | ORAL | Status: DC
  Administered 2014-08-06 – 2014-08-09 (×4): 10 mg via ORAL
  Filled 2014-08-05 (×6): qty 1

## 2014-08-05 MED ORDER — ACETAMINOPHEN 325 MG PO TABS
650.0000 mg | ORAL_TABLET | ORAL | Status: DC | PRN
  Administered 2014-08-09: 650 mg via ORAL
  Filled 2014-08-05: qty 2

## 2014-08-05 MED ORDER — ATENOLOL 25 MG PO TABS
25.0000 mg | ORAL_TABLET | Freq: Every day | ORAL | Status: DC
  Filled 2014-08-05 (×2): qty 1

## 2014-08-05 MED ORDER — LORATADINE 10 MG PO TABS
10.0000 mg | ORAL_TABLET | Freq: Every day | ORAL | Status: DC
  Administered 2014-08-05: 10 mg via ORAL
  Filled 2014-08-05 (×2): qty 1

## 2014-08-05 MED ORDER — ONDANSETRON HCL 4 MG PO TABS: 4.0000 mg | ORAL_TABLET | Freq: Three times a day (TID) | ORAL | Status: DC | PRN

## 2014-08-05 MED ORDER — INSULIN ASPART 100 UNIT/ML ~~LOC~~ SOLN
10.0000 [IU] | Freq: Three times a day (TID) | SUBCUTANEOUS | Status: DC
  Administered 2014-08-06 – 2014-08-09 (×11): 10 [IU] via SUBCUTANEOUS

## 2014-08-05 MED ORDER — BUDESONIDE-FORMOTEROL FUMARATE 80-4.5 MCG/ACT IN AERO
2.0000 | INHALATION_SPRAY | Freq: Two times a day (BID) | RESPIRATORY_TRACT | Status: DC
  Administered 2014-08-05 – 2014-08-09 (×8): 2 via RESPIRATORY_TRACT
  Filled 2014-08-05 (×2): qty 6.9

## 2014-08-05 MED ORDER — PANTOPRAZOLE SODIUM 40 MG PO TBEC
40.0000 mg | DELAYED_RELEASE_TABLET | Freq: Every day | ORAL | Status: DC
  Administered 2014-08-06 – 2014-08-09 (×4): 40 mg via ORAL
  Filled 2014-08-05 (×6): qty 1

## 2014-08-05 MED ORDER — EFINACONAZOLE 10 % EX SOLN: 1.0000 | Freq: Every morning | CUTANEOUS | Status: DC

## 2014-08-05 MED ORDER — EZETIMIBE 10 MG PO TABS
10.0000 mg | ORAL_TABLET | Freq: Every day | ORAL | Status: DC
  Administered 2014-08-05: 10 mg via ORAL
  Filled 2014-08-05 (×2): qty 1

## 2014-08-05 MED ORDER — ATENOLOL 25 MG PO TABS
25.0000 mg | ORAL_TABLET | Freq: Every day | ORAL | Status: DC
  Administered 2014-08-06 – 2014-08-09 (×4): 25 mg via ORAL
  Filled 2014-08-05 (×6): qty 1

## 2014-08-05 MED ORDER — LOSARTAN POTASSIUM 25 MG PO TABS
25.0000 mg | ORAL_TABLET | Freq: Every day | ORAL | Status: DC
  Administered 2014-08-06 – 2014-08-09 (×4): 25 mg via ORAL
  Filled 2014-08-05 (×6): qty 1

## 2014-08-05 MED ORDER — INSULIN GLARGINE 100 UNIT/ML ~~LOC~~ SOLN
45.0000 [IU] | Freq: Every day | SUBCUTANEOUS | Status: DC
  Administered 2014-08-05 – 2014-08-08 (×4): 45 [IU] via SUBCUTANEOUS

## 2014-08-05 MED ORDER — LORAZEPAM 1 MG PO TABS: 1.0000 mg | ORAL_TABLET | Freq: Three times a day (TID) | ORAL | Status: DC | PRN

## 2014-08-05 MED ORDER — GABAPENTIN 300 MG PO CAPS
300.0000 mg | ORAL_CAPSULE | Freq: Three times a day (TID) | ORAL | Status: DC
  Administered 2014-08-05 (×2): 300 mg via ORAL
  Filled 2014-08-05 (×2): qty 1

## 2014-08-05 MED ORDER — INSULIN ASPART PROT & ASPART (70-30 MIX) 100 UNIT/ML PEN: 50.0000 [IU] | PEN_INJECTOR | Freq: Two times a day (BID) | SUBCUTANEOUS | Status: DC

## 2014-08-05 MED ORDER — INSULIN GLARGINE 100 UNIT/ML ~~LOC~~ SOLN
45.0000 [IU] | Freq: Every day | SUBCUTANEOUS | Status: DC
  Filled 2014-08-05: qty 0.45

## 2014-08-05 MED ORDER — LOSARTAN POTASSIUM 25 MG PO TABS
25.0000 mg | ORAL_TABLET | Freq: Every day | ORAL | Status: DC
  Filled 2014-08-05 (×2): qty 1

## 2014-08-05 MED ORDER — BUDESONIDE-FORMOTEROL FUMARATE 80-4.5 MCG/ACT IN AERO
2.0000 | INHALATION_SPRAY | Freq: Two times a day (BID) | RESPIRATORY_TRACT | Status: DC
  Administered 2014-08-05: 2 via RESPIRATORY_TRACT
  Filled 2014-08-05: qty 6.9

## 2014-08-05 MED ORDER — NICOTINE 21 MG/24HR TD PT24
21.0000 mg | MEDICATED_PATCH | Freq: Every day | TRANSDERMAL | Status: DC
Start: 2014-08-06 — End: 2014-08-09
  Administered 2014-08-06 – 2014-08-07 (×2): 21 mg via TRANSDERMAL
  Filled 2014-08-05 (×6): qty 1

## 2014-08-05 MED ORDER — METFORMIN HCL 500 MG PO TABS
1000.0000 mg | ORAL_TABLET | Freq: Two times a day (BID) | ORAL | Status: DC
  Administered 2014-08-06 – 2014-08-09 (×6): 1000 mg via ORAL
  Filled 2014-08-05 (×11): qty 2

## 2014-08-05 MED ORDER — INSULIN GLARGINE 100 UNIT/ML ~~LOC~~ SOLN: 10.0000 [IU] | Freq: Every day | SUBCUTANEOUS | Status: DC

## 2014-08-05 NOTE — ED Notes (Signed)
Dr. Effie ShyWentz notified to elevated blood sugar and no insulin orders. Dr. Effie ShyWentz stated he would look into it

## 2014-08-05 NOTE — Tx Team (Signed)
Initial Interdisciplinary Treatment Plan   PATIENT STRESSORS: Financial difficulties Health problems Marital or family conflict Substance abuse   PATIENT STRENGTHS: Ability for insight Active sense of humor Communication skills   PROBLEM LIST: Problem List/Patient Goals Date to be addressed Date deferred Reason deferred Estimated date of resolution  "I've been depressed" 08/05/2014  08/05/2014    "I want a house and a job" 08/05/2014 08/05/2014    "I want to sleep" 08/05/2014 08/05/2014    "I would like a home care nurse because I don' like giving myself insulin anymore." 08/05/2014 08/05/2014    "help with my court date in February." 08/05/2014 08/05/2014                             DISCHARGE CRITERIA:  Ability to meet basic life and health needs Adequate post-discharge living arrangements Improved stabilization in mood, thinking, and/or behavior Verbal commitment to aftercare and medication compliance  PRELIMINARY DISCHARGE PLAN: Attend aftercare/continuing care group Outpatient therapy Placement in alternative living arrangements  PATIENT/FAMIILY INVOLVEMENT: This treatment plan has been presented to and reviewed with the patient, Wayne [name] .The patient and family have been given the opportunity to ask questions and make suggestions.  Aurora Maskwyman, Sylina Henion E 08/05/2014, 7:42 PM

## 2014-08-05 NOTE — ED Notes (Addendum)
Dondra SpryGail, NP at bedside and given order to stop all IV medication, d/c IV and [facility]

## 2014-08-05 NOTE — ED Notes (Signed)
Pt clothing placed in belongings bag, labeled, removed from room and placed at NS. Pt valuables included: 1 cell phone, 1 lighter, 1 blue jacket, 1 pair black shoes, 1 pair of black socks, 1 navy shirt, 1 pair of underwear, 1 pair of jeans, and 1 black belt.

## 2014-08-05 NOTE — ED Notes (Signed)
Called report to ConcordNicki, Charity fundraiserN. [facility].

## 2014-08-05 NOTE — ED Notes (Signed)
[facility]

## 2014-08-05 NOTE — ED Notes (Signed)
Resting quietly with eye closed. Easily arousable. Verbally responsive. Resp even and unlabored. ABC's intact. NAD noted.  

## 2014-08-05 NOTE — ED Notes (Addendum)
Awake. Verbally responsive. A/O x4. Resp even and unlabored. No audible adventitious breath sounds noted. ABC's intact. Pt ambulated to BR with steady gait. 

## 2014-08-05 NOTE — ED Notes (Addendum)
Resting quietly with eye closed. Easily arousable. Verbally responsive. Resp even and unlabored. ABC's intact. IV infusing NS at 125ml/hr without difficulty. NAD noted.  

## 2014-08-05 NOTE — Progress Notes (Signed)
D    Pt is pleasant on approach and cooperative   He complained of his hands specifically his fingertips feeling numb   He is able to move from his bed to the wheelchair independently   He tends to isolate to his room but interacts appropriately with others  He expressed concern that his insulin was changed from 70/30 to lantus and figured the lantus would not work as well as the 70/30 A   Verbal support given   Medications administered and effectiveness monitored    Q 15 min checks R   Pt safe at present

## 2014-08-05 NOTE — ED Notes (Signed)
Resting quietly with eye closed. Easily arousable. Verbally responsive. Resp even and unlabored. ABC's intact. No s/s of hypo/hyperglycemia noted. NAD noted.

## 2014-08-05 NOTE — Progress Notes (Addendum)
Patient ID: Wayne [name], male   DOB: 11/24/1975, 39 y.o.   MRN: [id number]  39 year old [facility]. Pt reports getting into an argument with his sisters and cousins about his work [employer]. Pt reports that he has been "depressed" for a long time but it has been really bad "for the past four days." Pt reports that he wants help finding affordable housing and a care nurse at home because "I can't give myself insulin anymore, it hurts too much." Pt currently denies SI/HI and A/VH. Pt tearful at times. Pt reports generalized lower and bilateral hand weakness and chronic pain. Pt has an unsteady gait and reports a fall "yesterday." Pt says he needs help with his court date in February. Pt states "I was so broke that I falsified checks."  Belongings and person search completed. Small pipe found in belongings. [facility]. Pt oriented to unit. Consents signed. Pt stable and currently eating in dayroom.

## 2014-08-05 NOTE — ED Notes (Addendum)
Patient C/O of muscle spasm/cramps on both arms. DR notified. Ordered I mg of Ativan

## 2014-08-05 NOTE — ED Notes (Signed)
Rodell PernaPatrice, Charity fundraiserN at [facility]

## 2014-08-05 NOTE — ED Provider Notes (Signed)
Patient's glucose stabilizer has been stopped.  He's been taking positioned to oral anti-glycemic and transferred to behavioral health unit for further evaluation of his original psychiatric complaint  Arman FilterGail K Janayia Burggraf, NP 08/05/14 [phone]  Hanley SeamenJohn L Molpus, MD 08/05/14 [phone]
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
## 2014-08-05 NOTE — Progress Notes (Signed)
Inpatient Diabetes Program Recommendations  AACE/ADA: New Consensus Statement on Inpatient Glycemic Control (2013)  Target Ranges:  Prepandial:   less than 140 mg/dL      Peak postprandial:   less than 180 mg/dL (1-2 hours)      Critically ill patients:  140 - 180 mg/dL   Reason for Assessment:  [facility]    Diabetes history: Type 2 diabetes on Insulin Outpatient Diabetes medications: It appears that patient was on insulin prior to admit.  Lantus 45 units q HS, Humalog 10 units tid with meals-per medication Current orders for Inpatient glycemic control:  Patient transitioned off insulin drip to oral agents only.  CBG now back up to 322 mg/dL this morning.  Recommend restart of home insulins.  Called and discussed with Dr. Effie ShyWentz.   He states that he will place new orders.  Also discussed with RN.  Thanks, Beryl MeagerJenny Angeliz Settlemyre, RN, BC-ADM Inpatient Diabetes Coordinator Pager [phone]

## 2014-08-05 NOTE — ED Notes (Signed)
Resting quietly with eye closed. Easily arousable. Verbally responsive. Resp even and unlabored. ABC's intact. No s/s of hypo/hyperglycemia noted. NAD noted.  

## 2014-08-06 ENCOUNTER — Encounter (HOSPITAL_COMMUNITY): Payer: Self-pay | Admitting: Psychiatry

## 2014-08-06 DIAGNOSIS — F332 Major depressive disorder, recurrent severe without psychotic features: Principal | ICD-10-CM

## 2014-08-06 LAB — GLUCOSE, CAPILLARY
GLUCOSE-CAPILLARY: 161 mg/dL — AB (ref 70–99)
Glucose-Capillary: 371 mg/dL — ABNORMAL HIGH (ref 70–99)
Glucose-Capillary: 392 mg/dL — ABNORMAL HIGH (ref 70–99)
Glucose-Capillary: 115 mg/dL — ABNORMAL HIGH (ref 70–99)

## 2014-08-06 MED ORDER — INSULIN ASPART 100 UNIT/ML ~~LOC~~ SOLN
0.0000 [IU] | Freq: Three times a day (TID) | SUBCUTANEOUS | Status: DC
  Administered 2014-08-06: 15 [IU] via SUBCUTANEOUS
  Administered 2014-08-07 – 2014-08-08 (×4): 5 [IU] via SUBCUTANEOUS
  Administered 2014-08-08: 3 [IU] via SUBCUTANEOUS
  Administered 2014-08-08 – 2014-08-09 (×2): 8 [IU] via SUBCUTANEOUS
  Administered 2014-08-09: 5 [IU] via SUBCUTANEOUS

## 2014-08-06 MED ORDER — ARIPIPRAZOLE 10 MG PO TABS
10.0000 mg | ORAL_TABLET | Freq: Two times a day (BID) | ORAL | Status: DC
  Administered 2014-08-06 – 2014-08-09 (×6): 10 mg via ORAL
  Filled 2014-08-06 (×10): qty 1

## 2014-08-06 MED ORDER — METHOCARBAMOL 500 MG PO TABS: 500.0000 mg | ORAL_TABLET | Freq: Three times a day (TID) | ORAL | Status: DC | PRN

## 2014-08-06 MED ORDER — ALBUTEROL SULFATE HFA 108 (90 BASE) MCG/ACT IN AERS: 2.0000 | INHALATION_SPRAY | Freq: Four times a day (QID) | RESPIRATORY_TRACT | Status: DC | PRN

## 2014-08-06 MED ORDER — BUPROPION HCL ER (SR) 150 MG PO TB12
150.0000 mg | ORAL_TABLET | Freq: Two times a day (BID) | ORAL | Status: DC
  Administered 2014-08-06 – 2014-08-07 (×2): 150 mg via ORAL
  Filled 2014-08-06 (×4): qty 1

## 2014-08-06 MED ORDER — LORATADINE 10 MG PO TABS: 10.0000 mg | ORAL_TABLET | Freq: Every day | ORAL | Status: DC

## 2014-08-06 MED ORDER — FLUTICASONE PROPIONATE 50 MCG/ACT NA SUSP: 2.0000 | Freq: Every day | NASAL | Status: DC

## 2014-08-06 MED ORDER — GABAPENTIN 300 MG PO CAPS
300.0000 mg | ORAL_CAPSULE | Freq: Four times a day (QID) | ORAL | Status: DC
  Administered 2014-08-06 – 2014-08-09 (×12): 300 mg via ORAL
  Filled 2014-08-06 (×16): qty 1

## 2014-08-06 NOTE — [facility] Suicide Risk Assessment (Signed)
Suicide Risk Assessment  Admission Assessment     Nursing information obtained from:    Demographic factors:    Current Mental Status:    Loss Factors:    Historical Factors:    Risk Reduction Factors:    Total Time spent with patient: 45 minutes  CLINICAL FACTORS:   Depression:   Insomnia Severe Chronic Pain Medical Diagnoses and Treatments/Surgeries  Psychiatric Specialty Exam:     Blood pressure 123/82, pulse 99, temperature 98.3 F (36.8 C), temperature source Oral, resp. rate 18, height 6' 0.75" (1.848 m), weight 74.39 kg (164 lb).Body mass index is 21.78 kg/(m^2).  General Appearance: Fairly Groomed  Patent attorneyye Contact::  Fair  Speech:  Clear and Coherent  Volume:  Normal  Mood:  Anxious, Depressed, Dysphoric and Hopeless  Affect:  Depressed and anxious worried  Thought Process:  Coherent and Goal Directed  Orientation:  Full (Time, Place, and Person)  Thought Content:  symptoms events worries concerns  Suicidal Thoughts:  Yes.  without intent/plan  Homicidal Thoughts:  No  Memory:  Immediate;   Fair Recent;   Fair Remote;   Fair  Judgement:  Fair  Insight:  Present  Psychomotor Activity:  Restlessness  Concentration:  Fair  Recall:  [employer]Fair  Fund of Knowledge:Fair  Language: Fair  Akathisia:  No  Handed:  Right  AIMS (if indicated):     Assets:  Desire for Improvement Housing Resilience Talents/Skills Vocational/Educational  Sleep:      Musculoskeletal: Strength & Muscle Tone: within normal limits Gait & Station: affected by severe neurotpathy Patient leans: N/A  COGNITIVE FEATURES THAT CONTRIBUTE TO RISK:  Closed-mindedness Polarized thinking Thought constriction (tunnel vision)    SUICIDE RISK:   Moderate:  PLAN OF CARE: Supportive approach/coping skills                              CBT;mindfulness                              Continue Wellbutrin/Abilify and optimize response (increase the Wellbutrin to                              300 mg daily)                           Will increase the Neurontin to address the Neuropathy                                 I certify that inpatient services furnished can reasonably be expected to improve the patient's condition.  Wayne [name] A 08/06/2014, 6:07 PM

## 2014-08-06 NOTE — H&P (Signed)
Psychiatric Admission Assessment Adult  Patient Identification:  Wayne [name]  Date of Evaluation:  08/06/2014  Chief Complaint:  MAJOR DEPRESSIVE DISORDER,RECURRENT,SEVERE  History of Present Illness: Wayne [name] is a 39 year old African-American male, admitted from the [facility] ED. He reports, "My ACT Team took me to the hospital last Sunday. 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 My sister picked me up early to take me to another Group home to work. We got into an argument. Then my niece said something really smart. That got me very upset. My health is detioriating, my hands and legs are weak and hurt all the time from diabetic neuropathy. I have not been taking good care of myself, which to me is kind of slow suicide. In an instant I thought and said, may be I should just die. I called my ACT Team and they brought me to the hospital. It was a spur of the moment kind of thing. I was not going to hurt myself. I need someone to talk to for I'm going through a lot. I mean I need counseling, talk therapy".  O: Shellie is alert & oriented x 3. He is currently using wheel chair to aid his mobility due to lower extremity weakness associated with diabetic Neuropathy. He complained of numbness and weakness to bilateral hands as well. Has ordered PT/OT consults.  Elements:  Location:  Major depressive disorder, recurrent episodes. Quality:   Frustration, despair, hopelessness, worsening depression, suicidal ideation. Severity:  Severe. Timing:  Last few days. Duration:  Chronic. Context:  "It was on Sunday (2 days ago), I got into an argument with my sisters, got frustrated & depressed, became suicidal".  Associated Signs/Symptoms:  Depression Symptoms:  depressed mood, insomnia, hopelessness, loss of energy/fatigue,  (Hypo) Manic Symptoms:  Denies  Anxiety Symptoms:  Excessive Worry,  Psychotic Symptoms:  Denies  PTSD Symptoms: NA  Total Time spent with patient: 1 hour  Psychiatric  Specialty Exam: Physical Exam  Constitutional: He is oriented to person, place, and time. He appears well-developed.  HENT:  Head: Normocephalic.  Eyes: Pupils are equal, round, and reactive to light.  Neck: Normal range of motion.  Cardiovascular: Normal rate and regular rhythm.   Hx cardiac issues/HTN  Respiratory: Effort normal and breath sounds normal.  Hx SOB  GI: Soft. Bowel sounds are normal.  Genitourinary:  Denies any issues in this areas  Musculoskeletal: Normal range of motion.  Upper/Lower extremity weakness.  Neurological: He is alert and oriented to person, place, and time.  Skin: Skin is warm and dry.  Psychiatric: His speech is normal and behavior is normal. Thought content normal. His mood appears anxious. His affect is not angry, not blunt, not labile and not inappropriate. Cognition and memory are normal. He expresses impulsivity. He exhibits a depressed mood.    Review of Systems  Constitutional: Positive for malaise/fatigue.  HENT: Negative.   Eyes: Negative.   Respiratory: Negative.        Hx: SOB   Cardiovascular: Negative.        Hx cardiac condition   Gastrointestinal: Positive for abdominal pain (Hx of).  Genitourinary: Positive for urgency and frequency.  Musculoskeletal: Positive for myalgias.       Upper/lower extremity weakness.  Skin: Negative.   Neurological: Positive for weakness.  Endo/Heme/Allergies: Negative.   Psychiatric/Behavioral: Positive for depression and substance abuse. Negative for suicidal ideas, hallucinations and memory loss. The patient is nervous/anxious and has insomnia.     Blood pressure   123/82, pulse 99, temperature 98.3 F (36.8 C), temperature source Oral, resp. rate 18, height 6' 0.75" (1.848 m), weight 74.39 kg (164 lb).Body mass index is 21.78 kg/(m^2).  General Appearance: Disheveled  Eye Contact::  Good  Speech:  Clear and Coherent  Volume:  Normal  Mood:  Depressed  Affect:  Congruent and Flat  Thought  Process:  Coherent and Goal Directed  Orientation:  Full (Time, Place, and Person)  Thought Content:  Rumination  Suicidal Thoughts:  No, but present at the ED  Homicidal Thoughts:  No, but present at the ED  Memory:  Immediate;   Good Recent;   Good Remote;   Good  Judgement:  Fair  Insight:  Present  Psychomotor Activity:  Normal  Concentration:  Good  Recall:  Good  Fund of Knowledge:Good  Language: Good  Akathisia:  No  Handed:  Right  AIMS (if indicated):     Assets:  Communication Skills Desire for Improvement  Sleep:      Musculoskeletal: Strength & Muscle Tone: within normal limits Gait & Station: unsteady Patient leans: Currently using wheel cahir to aid ambulation.  Past Psychiatric History: Diagnosis: Major depressive disorder, recurrent episodes  Hospitalizations: [facility] adult unit x 4, [facility] x s  Outpatient Care: [facility] ACT Team  Substance Abuse Care: Denies  Self-Mutilation: Denies  Suicidal Attempts: Denies  Violent Behaviors: NA   Past Medical History:   Past Medical History  Diagnosis Date  . Depression   . Diabetes mellitus   . Hyperlipidemia   . HTN (hypertension)   . Chronic headaches   . Osteoarthritis   . Kidney stones   . Polysubstance abuse     cocaine, marijuana-quit summer 2010  . Tobacco abuse   . Asthma   . Chest pain   . Lumbar disc disease   . GERD (gastroesophageal reflux disease)   . OSA (obstructive sleep apnea)   . Peripheral neuropathy 02/01/2011  . Bipolar 1 disorder   . Ingrown nail 11/12/2013   Cardiac History:  THN, Asthma  Allergies:   Allergies  Allergen Reactions  . Iodine     rash  . Lipitor [Atorvastatin]     All statins. Muscle weakness.   . Nystatin   . Statins Other (See Comments)    Muscle weakness, stomach cramps   PTA Medications: Prescriptions prior to admission  Medication Sig Dispense Refill Last Dose  . ABILIFY 10 MG tablet Take 10 mg by mouth 2 (two) times daily.    08/01/2014  at Unknown time  . albuterol (PROVENTIL HFA;VENTOLIN HFA) 108 (90 BASE) MCG/ACT inhaler Inhale 2 puffs into the lungs every 6 (six) hours as needed for wheezing (wheezing). For shortness of breath   08/04/2014 at Unknown time  . aspirin EC 81 MG tablet Take 81 mg by mouth daily.    08/01/2014 at Unknown time  . atenolol (TENORMIN) 25 MG tablet Take 25 mg by mouth daily.    08/01/2014 at Unknown time  . budesonide-formoterol (SYMBICORT) 80-4.5 MCG/ACT inhaler Inhale 2 puffs into the lungs 2 (two) times daily.   08/01/2014 at Unknown time  . buPROPion (WELLBUTRIN SR) 150 MG 12 hr tablet Take 150 mg by mouth 2 (two) times daily.   08/01/2014 at Unknown time  . dextromethorphan-guaiFENesin (MUCINEX DM) 30-600 MG per 12 hr tablet Take 1 tablet by mouth 2 (two) times daily as needed for cough (cough).   08/04/2014 at Unknown time  . Efinaconazole (JUBLIA) 10 % SOLN Apply   1 applicator topically every morning. 1 Bottle 3 unknown at unknown time  . ezetimibe (ZETIA) 10 MG tablet Take 1 tablet (10 mg total) by mouth daily. 30 tablet 1 08/01/2014 at Unknown time  . fluticasone (FLONASE) 50 MCG/ACT nasal spray Place 2 sprays into both nostrils daily.    08/01/2014 at Unknown time  . gabapentin (NEURONTIN) 300 MG capsule Take 4 capsules (1,200 mg total) by mouth 2 (two) times daily. (Patient taking differently: Take 300 mg by mouth 3 (three) times daily. ) 240 capsule 0 08/01/2014 at Unknown time  . glipiZIDE (GLUCOTROL XL) 10 MG 24 hr tablet 1 tab per day for blood sugar more than 200 (Patient not taking: Reported on 08/04/2014) 30 tablet 2 Completed Course at Unknown time  . Glucosamine 500 MG TABS Take 1 tablet by mouth daily.    08/01/2014 at Unknown time  . glucose monitoring kit (FREESTYLE) monitoring kit 1 each by Does not apply route as needed for other. 1 each 1 08/01/2014 at Unknown time  . HYDROcodone-acetaminophen (NORCO) 5-325 MG per tablet Take 1-2 tablets by mouth every 6 (six) hours as needed for severe pain. 20 tablet  0 unknown at unknown time  . ibuprofen (ADVIL,MOTRIN) 200 MG tablet Take 600 mg by mouth every 8 (eight) hours as needed for mild pain or moderate pain.    08/04/2014 at Unknown time  . insulin glargine (LANTUS) 100 UNIT/ML injection Inject 45 Units into the skin at bedtime. 10 mL 4 Taking  . insulin glargine (LANTUS) 100 UNIT/ML injection Inject 10-20 Units into the skin at bedtime.   unknown at [facility] time  . insulin lispro (HUMALOG) 100 UNIT/ML injection Inject 10 Units into the skin 3 (three) times daily before meals. 10 mL 4 Taking  . lidocaine (XYLOCAINE) 2 % solution Use as directed in the mouth or throat 2 (two) times daily.   08/03/2014 at Unknown time  . loratadine (CLARITIN) 10 MG tablet Take 1 tablet (10 mg total) by mouth daily.   05/28/2012 at Unknown  . losartan (COZAAR) 25 MG tablet Take 25 mg by mouth daily.   08/01/2014 at Unknown time  . metFORMIN (GLUCOPHAGE) 1000 MG tablet Take 1 tablet (1,000 mg total) by mouth 2 (two) times daily with a meal. (Patient taking differently: Take 2,000 mg by mouth 2 (two) times daily with a meal. ) 60 tablet 0 08/04/2014 at Unknown time  . methocarbamol (ROBAXIN) 500 MG tablet Take 500 mg by mouth every 8 (eight) hours as needed for muscle spasms (muscle spasms).    08/01/2014 at Unknown time  . NEXIUM 40 MG capsule Take 40 mg by mouth daily.    08/01/2014 at Unknown time  . NOVOLOG MIX 70/30 FLEXPEN (70-30) 100 UNIT/ML Pen Inject 50 Units into the skin 2 (two) times daily.    unknown at unknown time  . sertraline (ZOLOFT) 100 MG tablet Take 2 tablets (200 mg total) by mouth daily. 60 tablet 0 Taking  . sertraline (ZOLOFT) 100 MG tablet Take 150 mg by mouth daily.   08/01/2014 at Unknown time    Previous Psychotropic Medications: Medication/Dose  Wellbutrin SR 150 mg,  Sertraline 150 mg, Abilify 10 mg               Substance Abuse History in the last 12 months:  Yes.    Consequences of Substance Abuse: Medical Consequences:  Liver damage, Possible  death by overdose Legal Consequences:  Arrests, jail time, Loss of driving privilege. Family Consequences:  Family discord, divorce and or separation.  Social History:  reports that he quit smoking about 3 years ago. His smoking use included Cigarettes. He has a 3.4 pack-year smoking history. He has quit using smokeless tobacco. He reports that he drinks alcohol. He reports that he does not use illicit drugs. Additional Social History: Pain Medications: Denies abuse Prescriptions:  (oxycontin "a couple months ago") Over the Counter: Denies abuse Longest period of sobriety (when/how long): NA Negative Consequences of Use: Financial, Personal relationships Withdrawal Symptoms: Other (Comment) (reports none)  Current Place of Residence: [place], [place]    Place of Birth: [place], [place]   Family Members: "My Mother"  Marital Status:  Single  Children: 0  Sons:  Daughters:  Relationships: Single  Education:  College  Educational Problems/Performance: Completed a 2 year College  Religious Beliefs/Practices: NA  History of Abuse (Emotional/Phsycial/Sexual): Denies  Occupational Experiences: Employed  Military History:  None.  Legal History: Pending legal charges for failure to appear, Probation for obtaining a property under false pretense.   Hobbies/Interests: None reported.  Family History:   Family History  Problem Relation Age of Onset  . Heart disease Mother   . Diabetes Father   . Hyperlipidemia Father   . Emphysema Maternal Uncle   . Asthma Sister   . Asthma Mother   . Stroke Mother   . Hypertension Father   . Alzheimer's disease Maternal Grandmother   . Depression Father   . Breast cancer Maternal Aunt   . COPD Mother     maternal un    Results for orders placed or performed during the hospital encounter of 08/05/14 (from the past 72 hour(s))  Glucose, capillary     Status: Abnormal   Collection Time: 08/05/14 10:03 PM  Result Value Ref Range    Glucose-Capillary 343 (H) 70 - 99 mg/dL  Glucose, capillary     Status: Abnormal   Collection Time: 08/06/14  5:58 AM  Result Value Ref Range   Glucose-Capillary 371 (H) 70 - 99 mg/dL   Psychological Evaluations:  Assessment:   DSM5: Schizophrenia Disorders:  NA Obsessive-Compulsive Disorders:  NA Trauma-Stressor Disorders:  NA Substance/Addictive Disorders:  NA Depressive Disorders:  Major Depressive Disorder - Severe (296.23)  AXIS I:  Major Depressive Disorder - Severe (296.23) AXIS II:  Deferred AXIS III:   Past Medical History  Diagnosis Date  . Depression   . Diabetes mellitus   . Hyperlipidemia   . HTN (hypertension)   . Chronic headaches   . Osteoarthritis   . Kidney stones   . Polysubstance abuse     cocaine, marijuana-quit summer 2010  . Tobacco abuse   . Asthma   . Chest pain   . Lumbar disc disease   . GERD (gastroesophageal reflux disease)   . OSA (obstructive sleep apnea)   . Peripheral neuropathy 02/01/2011  . Bipolar 1 disorder   . Ingrown nail 11/12/2013   AXIS IV:  other psychosocial or environmental problems and chronic medical issues AXIS V:  11-20 some danger of hurting self or others possible OR occasionally fails to maintain minimal personal hygiene OR gross impairment in communication  Treatment Plan/Recommendations: 1. Admit for crisis management and stabilization, estimated length of stay 3-5 days.   2. Medication management to reduce current symptoms to base line and improve the patient's overall level of functioning; Resume Abilify 10 mg daily for mood control, Wellbutrin SR 150 mg for depression, Sertraline 100 mg for depression, Ativan 1 mg Q 8 hours   prn for anxiety & Ambien 5 mg for insomnia, increased Neurontin from 3oomg to 400 mg Qid for neuropathic pain..  3. Treat health problems as indicated.   4. Develop treatment plan to decrease risk of relapse upon discharge and the need for readmission.   5. Psycho-social education regarding  relapse prevention and self care.   6. Health care follow up as needed for medical problems.   7. Review, reconcile, and reinstate any pertinent home medications for other health issues where appropriate; continue Metformin 1,000 mg for DM, Tenormin 25 mg for HTN, Symbicort inhaler for SOB, ASA 81 mg for heart health, Zetia 10 mg for cholesterol/hyperlipidemia, Flonase for allergies, Neurontin 400 mg for neuropathic pain, Claritin 10 mg for allergies, Cozaar 25 mg for HTN, Robaxin 500 mg for muscle spasms & Protonix 40 mg for acid reflux  8. Call for consults with hospitalist for any additional specialty patient care services as needed.  Treatment Plan Summary: Daily contact with patient to assess and evaluate symptoms and progress in treatment Medication management  Current Medications:  Current Facility-Administered Medications  Medication Dose Route Frequency Provider Last Rate Last Dose  . acetaminophen (TYLENOL) tablet 650 mg  650 mg Oral Q4H PRN Josephine C Onuoha, NP      . albuterol (PROVENTIL HFA;VENTOLIN HFA) 108 (90 BASE) MCG/ACT inhaler 2 puff  2 puff Inhalation Q6H PRN Josephine C Onuoha, NP      . alum & mag hydroxide-simeth (MAALOX/MYLANTA) 200-200-20 MG/5ML suspension 30 mL  30 mL Oral PRN Josephine C Onuoha, NP      . aspirin EC tablet 81 mg  81 mg Oral Daily Josephine C Onuoha, NP   81 mg at 08/06/14 0823  . atenolol (TENORMIN) tablet 25 mg  25 mg Oral Daily Josephine C Onuoha, NP   25 mg at 08/06/14 0824  . budesonide-formoterol (SYMBICORT) 80-4.5 MCG/ACT inhaler 2 puff  2 puff Inhalation BID Josephine C Onuoha, NP   2 puff at 08/06/14 0824  . ezetimibe (ZETIA) tablet 10 mg  10 mg Oral Daily Josephine C Onuoha, NP   10 mg at 08/06/14 0824  . fluticasone (FLONASE) 50 MCG/ACT nasal spray 2 spray  2 spray Each Nare Daily Josephine C Onuoha, NP   2 spray at 08/06/14 0824  . gabapentin (NEURONTIN) capsule 300 mg  300 mg Oral TID Josephine C Onuoha, NP   300 mg at 08/06/14 0824   . ibuprofen (ADVIL,MOTRIN) tablet 600 mg  600 mg Oral Q8H PRN Josephine C Onuoha, NP      . insulin aspart (novoLOG) injection 0-15 Units  0-15 Units Subcutaneous TID WC Agnes I Nwoko, NP      . insulin aspart (novoLOG) injection 10 Units  10 Units Subcutaneous TID WC Josephine C Onuoha, NP   10 Units at 08/06/14 0631  . insulin glargine (LANTUS) injection 45 Units  45 Units Subcutaneous QHS Josephine C Onuoha, NP   45 Units at 08/05/14 2212  . loratadine (CLARITIN) tablet 10 mg  10 mg Oral Daily Josephine C Onuoha, NP   10 mg at 08/06/14 0824  . LORazepam (ATIVAN) tablet 1 mg  1 mg Oral Q8H PRN Josephine C Onuoha, NP      . losartan (COZAAR) tablet 25 mg  25 mg Oral Daily Josephine C Onuoha, NP   25 mg at 08/06/14 0824  . metFORMIN (GLUCOPHAGE) tablet 1,000 mg  1,000 mg Oral BID WC Josephine C Onuoha, NP   1,000 mg at 08/06/14 0823  . nicotine (  NICODERM CQ - dosed in mg/24 hours) patch 21 mg  21 mg Transdermal Daily Josephine C Onuoha, NP   21 mg at 08/06/14 0824  . ondansetron (ZOFRAN) tablet 4 mg  4 mg Oral Q8H PRN Josephine C Onuoha, NP      . pantoprazole (PROTONIX) EC tablet 40 mg  40 mg Oral Daily Josephine C Onuoha, NP   40 mg at 08/06/14 0823  . pneumococcal 23 valent vaccine (PNU-IMMUNE) injection 0.5 mL  0.5 mL Intramuscular Tomorrow-1000 Fernando A Cobos, MD      . zolpidem (AMBIEN) tablet 5 mg  5 mg Oral QHS PRN Josephine C Onuoha, NP        Observation Level/Precautions:  15 minute checks  Laboratory:  Per ED  Psychotherapy:  Group sessions  Medications: See medication lists   Consultations: As needed    Discharge Concerns: As needed   Estimated LOS: 2-4 days  Other:     I certify that inpatient services furnished can reasonably be expected to improve the patient's condition.   Nwoko, Agnes I, PMHNP-BC 1/12/201610:43 AM I personally assessed the patient, reviewed the physical exam and labs and formulated the treatment plan  A. , M.D. 

## 2014-08-06 NOTE — [facility] Group Notes (Signed)
The focus of this group is to educate the patient on the purpose and policies of crisis stabilization and provide a format to answer questions about their admission.  The group details unit policies and expectations of patients while admitted.  Patient attended 0900 nurse education orientation group this morning.  Patient actively participated, appropriate affect, alert, appropriate insight and engagement.  Today patient will work on 3 goals for discharge.  

## 2014-08-06 NOTE — Tx Team (Signed)
Interdisciplinary Treatment Plan Update (Adult) Date: 08/06/2014   Time Reviewed: 9:30 AM  Progress in Treatment: Attending groups: Continuing to assess, patient new to milieu Participating in groups: Continuing to assess, patient new to milieu Taking medication as prescribed: Yes Tolerating medication: Yes Family/Significant other contact made: No, CSW continuing to assess for appropriate contacts Patient understands diagnosis: Yes Discussing patient identified problems/goals with staff: Yes Medical problems stabilized or resolved: Yes Denies suicidal/homicidal ideation: Yes Issues/concerns per patient self-inventory: Yes Other:  New problem(s) identified: N/A  Discharge Plan or Barriers:  1/12: Continuing to assess, patient new to milieu.  Reason for Continuation of Hospitalization:  Depression Anxiety Medication Stabilization   Comments: N/A  Estimated length of stay: 3-5 days  For review of initial/current patient goals, please see plan of care.  Patient is a 39 year old African American male hospitalized for depression, SI, and thoughts of harming his nieces.  Attendees: Patient:    Family:    Physician: Dr. Jama Flavorsobos; Dr. Dub MikesLugo 08/06/14 9:30 AM  Nursing: Quintella ReichertBeverly Knight, Liborio Nixonatrice White RN 08/06/14 9:30 AM  Clinical Social Worker: Samuella BruinKristin Dennise Raabe, LCSWA 08/06/14 9:30 AM  Other: Juline PatchQuylle Hodnett, LCSW 08/06/14 9:30 AM  Other: Leisa LenzValerie Enoch, Vesta MixerMonarch Liaison 08/06/14 9:30 AM  Other: Onnie BoerJennifer Clark, Case Manager 08/06/14 9:30 AM  Other: Serena ColonelAggie Nwoko, NP 08/06/14 9:30 AM  Other: Fransisca KaufmannLaura Davis, NP 08/06/14 9:30 AM  Other:     Scribe for Treatment Team:  Samuella BruinKristin Ziquan Fidel, MSW, [employer] IncLCSWA [phone]

## 2014-08-06 NOTE — Progress Notes (Signed)
Pt attended spiritual care group on grief and loss facilitated by Counseling intern [place]azil[place]Jordan Austin and chaplain Burnis KingfisherMatthew Julena Barbour. Group opened with brief discussion and psycho-social ed around grief and loss in relationships and in relation to self - identifying life patterns, circumstances, changes that cause losses. Established group norm of speaking from own life experience. Group goal of establishing open and affirming space for members to share loss and experience with grief, normalize grief experience and provide psycho social education and grief support.  Group drew on narrative and Alderian therapeutic modalities.    Wayne [name] identified with another group member about family expectations related to grief.   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  Feels is is "needed by his family but not wanted."  His caring for others has diminished his ability to care for himself.  Expressed feeling as though he has lost himself and grieves loss of identity and hopes.  Described not knowing where to start to recover a sense of who he is.  Expressed hope in hearing med student intern share story of recovery.    Wayne [name], Wayne [name]

## 2014-08-06 NOTE — Clinical Social Work Note (Signed)
CSW left voicemail for patient's mother Wayne [name] [phone], awaiting return call.  Wayne BruinKristin Tra Wilemon, MSW, [employer] Clinical Social Worker [facility] [phone]

## 2014-08-06 NOTE — Progress Notes (Signed)
Recreation Therapy Notes  Animal-Assisted Activity/Therapy (AAA/T) Program Checklist/Progress Notes Patient Eligibility Criteria Checklist & Daily Group note for Rec Tx Intervention  Date: 01.12.2016 Time: 2:45pm Location: [address] Dayroom   AAA/T Program Assumption of Risk Form signed by Patient/ or Parent Legal Guardian yes  Patient is free of allergies or sever asthma yes  Patient reports no fear of animals yes  Patient reports no history of cruelty to animals yes  Patient understands his/her participation is voluntary yes  Patient washes hands before animal contact yes  Patient washes hands after animal contact yes  Behavioral Response: Appropriate   Education: Hand Washing, Appropriate Animal Interaction   Education Outcome: Acknowledges education.   Clinical Observations/Feedback: Patient interacted appropriately with peers and therapy dog during session. Patient asked appropriate questions about therapy dog and his training and shared stories about her pets with group.   Ethne Jeon L Magda Muise, LRT/CTRS  Nani Ingram L 08/06/2014 4:26 PM 

## 2014-08-06 NOTE — Progress Notes (Signed)
D    Pt is pleasant on approach and cooperative   He complained of his hands specifically his fingertips feeling numb   He is able to move from his bed to the wheelchair independently   He tends to isolate to his room but interacts appropriately with others  He expressed concern that his insulin was changed from 70/30 to lantus and figured the lantus would not work as well as the 70/30   Pt spent most of the shift in bed   He said he had a good day today A   Verbal support given   Medications administered and effectiveness monitored    Q 15 min checks  Continue to reinforce diabetic diet R   Pt safe at present and verbalizes understanding of diet
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
## 2014-08-06 NOTE — Progress Notes (Signed)
D: Pt presents with flat affect and depressed mood. Pt rates depression 7/10. Hopeless 7/10. Anxiety 3/10. Pt reports depression due to his declining health. Pt denies feeling suicidal but reports that he is homicidal towards his niece. Pt stated that his nieces and nephews continues to bother him and stress him out. Pt compliant with taking meds and attending groups.  A: Medications administered as ordered per MD. Verbal support given. Pt encourage to select meal portions according to carb modified diet. Pt encouraged to attend groups. 15 minute checks performed for safety,  R: Pt safety maintained. Pt receptive to treatment.

## 2014-08-07 DIAGNOSIS — F419 Anxiety disorder, unspecified: Secondary | ICD-10-CM

## 2014-08-07 DIAGNOSIS — F332 Major depressive disorder, recurrent severe without psychotic features: Secondary | ICD-10-CM | POA: Insufficient documentation

## 2014-08-07 DIAGNOSIS — F1490 Cocaine use, unspecified, uncomplicated: Secondary | ICD-10-CM

## 2014-08-07 LAB — GLUCOSE, CAPILLARY
GLUCOSE-CAPILLARY: 223 mg/dL — AB (ref 70–99)
GLUCOSE-CAPILLARY: 234 mg/dL — AB (ref 70–99)
GLUCOSE-CAPILLARY: 281 mg/dL — AB (ref 70–99)
Glucose-Capillary: 243 mg/dL — ABNORMAL HIGH (ref 70–99)

## 2014-08-07 MED ORDER — SERTRALINE HCL 100 MG PO TABS
100.0000 mg | ORAL_TABLET | Freq: Every day | ORAL | Status: DC
  Administered 2014-08-07 – 2014-08-08 (×2): 100 mg via ORAL
  Filled 2014-08-07 (×4): qty 1

## 2014-08-07 MED ORDER — ADULT MULTIVITAMIN W/MINERALS CH
1.0000 | ORAL_TABLET | Freq: Every day | ORAL | Status: DC
  Administered 2014-08-07 – 2014-08-09 (×3): 1 via ORAL
  Filled 2014-08-07 (×5): qty 1

## 2014-08-07 MED ORDER — GLUCERNA SHAKE PO LIQD
237.0000 mL | Freq: Two times a day (BID) | ORAL | Status: DC
  Administered 2014-08-08 – 2014-08-09 (×4): 237 mL via ORAL

## 2014-08-07 MED ORDER — BUPROPION HCL ER (XL) 300 MG PO TB24
300.0000 mg | ORAL_TABLET | Freq: Every day | ORAL | Status: DC
  Administered 2014-08-08 – 2014-08-09 (×2): 300 mg via ORAL
  Filled 2014-08-07 (×4): qty 1

## 2014-08-07 MED ORDER — GABAPENTIN 300 MG PO CAPS
300.0000 mg | ORAL_CAPSULE | Freq: Every day | ORAL | Status: DC
  Administered 2014-08-07 – 2014-08-08 (×2): 300 mg via ORAL
  Filled 2014-08-07 (×3): qty 1

## 2014-08-07 MED ORDER — BUPROPION HCL ER (SR) 150 MG PO TB12
150.0000 mg | ORAL_TABLET | Freq: Two times a day (BID) | ORAL | Status: AC
  Administered 2014-08-07: 150 mg via ORAL
  Filled 2014-08-07: qty 1

## 2014-08-07 NOTE — Progress Notes (Signed)
D: Pt presents anxious this morning. Pt reported sleeping well last night. Pt denies feeling suicidal this morning. Pt stated no homicidal thoughts this morning but stated once he goes back home, he may have some towards his niece. Pt rates decreasing depression this morning, 6/10 from 7/10. Pt compliant with taking meds. Pt requesting to have his Neurontin increased and to be started on naproxen for neuropathy pain. A: Medications administered as ordered per MD. Verbal support given. MD made aware of pt request. Pt encouraged to attend groups. 15 minute checks performed for safety. R: Pt receptive to treatment. Pt safety maintained.

## 2014-08-07 NOTE — [facility] Suicide Risk Assessment (Signed)
[facility] INPATIENT:  Family/Significant Other Suicide Prevention Education  Suicide Prevention Education:  Education Completed; Mother Fatima BlankMary Rayman [phone],  (name of family member/significant other) has been identified by the patient as the family member/significant other with whom the patient will be residing, and identified as the person(s) who will aid the patient in the event of a mental health crisis (suicidal ideations/suicide attempt).  With written consent from the patient, the family member/significant other has been provided the following suicide prevention education, prior to the and/or following the discharge of the patient.  The suicide prevention education provided includes the following:  Suicide risk factors  Suicide prevention and interventions  National Suicide Hotline telephone number  [facility] assessment telephone number  [facility] Emergency Assistance 911  [facility] and/or Residential Mobile Crisis Unit telephone number  Request made of family/significant other to:  Remove weapons (e.g., guns, rifles, knives), all items previously/currently identified as safety concern.    Remove drugs/medications (over-the-counter, prescriptions, illicit drugs), all items previously/currently identified as a safety concern.  The family member/significant other verbalizes understanding of the suicide prevention education information provided.  The family member/significant other agrees to remove the items of safety concern listed above.  Dyami Umbach, [place] CarboKristin L 08/07/2014, 8:46 AM

## 2014-08-07 NOTE — Evaluation (Signed)
Physical Therapy Evaluation Patient Details Name: Wayne [name] A Bellis MRN: [id number] DOB: 09/04/1975 Today's Date: 08/07/2014   History of Present Illness  39 y.o. male with h/o diabetes, neuropathy admitted to behavioral health with depression/suicidal ideation.  He has had 2 falls recently and has noticed weakness/numbness in his legs. He is using a WC at behavioral health, but was walking independently PTA.    Clinical Impression  **Pt admitted with above diagnosis. Pt currently with functional limitations due to the deficits listed below (see PT Problem List). ** Pt will benefit from skilled PT to increase their independence and safety with mobility to allow discharge to the venue listed below.    Pt ambulated 250' with RW with supervision. Use of RW on unit recommended. If he's having trouble with mobility upon DC he may need a WC.  *    Follow Up Recommendations No PT follow up    Equipment Recommendations  Wheelchair (measurements PT)    Recommendations for Other Services       Precautions / Restrictions Precautions Precautions: Fall Precaution Comments: pt reports 2 falls recently Restrictions Weight Bearing Restrictions: No      Mobility  Bed Mobility                  Transfers Overall transfer level: Modified independent Equipment used: Rolling walker (2 wheeled)             General transfer comment: [facility] using armrests  Ambulation/Gait Ambulation/Gait assistance: Supervision Ambulation Distance (Feet): 250 Feet Assistive device: Rolling walker (2 wheeled) Gait Pattern/deviations: Step-to pattern   Gait velocity interpretation: Below normal speed for age/gender General Gait Details: steady with RW, cues for positioning in RW  Stairs            Wheelchair Mobility    Modified Rankin (Stroke Patients Only)       Balance Overall balance assessment: History of Falls   Sitting balance-Leahy Scale: Good       Standing  balance-Leahy Scale: Fair                               Pertinent Vitals/Pain Pain Assessment: No/denies pain    Home Living Family/patient expects to be discharged to:: Other (Comment) ("Pt wants assistance in finding independent housing") Living Arrangements: Parent ("mom")               Additional Comments: [facility]    Prior Function Level of Independence: Independent               Hand Dominance        Extremity/Trunk Assessment   Upper Extremity Assessment: Defer to OT evaluation           Lower Extremity Assessment: LLE deficits/detail;RLE deficits/detail RLE Deficits / Details: R foot numb to light touch medially and superiorly LLE Deficits / Details: L foot numb to light touch medially and superiorly, knee extension -4/5  Cervical / Trunk Assessment: Normal  Communication   Communication: No difficulties  Cognition Arousal/Alertness: Awake/alert Behavior During Therapy: WFL for tasks assessed/performed Overall Cognitive Status: Within Functional Limits for tasks assessed                      General Comments      Exercises        Assessment/Plan    PT Assessment Patient needs continued PT services  PT Diagnosis Difficulty walking   PT Problem List Decreased strength;Impaired sensation;Decreased balance;Decreased mobility  PT Treatment Interventions DME instruction;Gait training;Functional mobility training;Balance training   PT Goals (Current goals can be found in the Care Plan section) Acute Rehab PT Goals Patient Stated Goal: to walk PT Goal Formulation: With patient Time For Goal Achievement: 08/21/14 Potential to Achieve Goals: Good    Frequency Min 3X/week   Barriers to discharge        Co-evaluation               End of Session Equipment Utilized During Treatment: Gait belt Activity Tolerance: Patient tolerated treatment well Patient left: in chair Nurse Communication:  Mobility status    Functional Assessment Tool Used: clinical judgement Functional Limitation: Mobility: Walking and moving around Mobility: Walking and Moving Around Current Status (Z6109): At least 1 percent but less than 20 percent impaired, limited or restricted Mobility: Walking and Moving Around Goal Status [phone]): 0 percent impaired, limited or restricted    Time: 1348-1410 PT Time Calculation (min) (ACUTE ONLY): 22 min   Charges:   PT Evaluation $Initial PT Evaluation Tier I: 1 Procedure PT Treatments $Gait Training: 8-22 mins   PT G Codes:   PT G-Codes **NOT FOR INPATIENT CLASS** Functional Assessment Tool Used: clinical judgement Functional Limitation: Mobility: Walking and moving around Mobility: Walking and Moving Around Current Status (U9811): At least 1 percent but less than 20 percent impaired, limited or restricted Mobility: Walking and Moving Around Goal Status [phone]): 0 percent impaired, limited or restricted    Tamala Ser 08/07/2014, 2:25 PM [phone]

## 2014-08-07 NOTE — Evaluation (Signed)
Occupational Therapy Evaluation Patient Details Name: Wayne [name] MRN: [id number] DOB: [birth year] Today's Date: 08/07/2014    History of Present Illness 39 y.o. male with h/o diabetes, neuropathy admitted to behavioral health with depression/suicidal ideation.    Clinical Impression   Pt was admitted for the above.  He has RUE weakness which impairs ADLs.  Pt reports this just started on Monday.  Will follow in OT to increase strength, coordination and use of RUE.    Follow Up Recommendations    Outpatient OT   Equipment Recommendations    none   Recommendations for Other Services       Precautions / Restrictions Precautions Precautions: Fall Precaution Comments: pt reports 2 falls recently Restrictions Weight Bearing Restrictions: No      Mobility Bed Mobility Overal bed mobility: Independent                Transfers Overall transfer level: Modified independent Equipment used: Rolling walker (2 wheeled)             General transfer comment: sit to stand from [facility] using armrests    Balance Overall balance assessment: History of Falls   Sitting balance-Leahy Scale: Good       Standing balance-Leahy Scale: Fair                              ADL Overall ADL's : Needs assistance/impaired                                       General ADL Comments: pt does SPT independently to wheelchair.  Needs set up for self feeding and has awkward movements, often using L when RUE gets tired.  Provided written HEP for ball and tan (level one) theraputty.  Left with RN.  Sent built up foam over with PT for utensil, toothbrush and pencil (labelled for each use).       Vision                     Perception     Praxis      Pertinent Vitals/Pain Pain Assessment: No/denies pain     Hand Dominance     Extremity/Trunk Assessment Upper Extremity Assessment Upper Extremity Assessment: RUE deficits/detail (LUE WFLs, h/o L shoulder  injury) RUE Deficits / Details: dominant hand:  intrinsic weakness and extensor weakness especially 5th digit.  Gross flexion 4-/5; extension 3-/5; intrinsics 2-/5      Cervical / Trunk Assessment Cervical / Trunk Assessment: Normal   Communication Communication Communication: No difficulties   Cognition Arousal/Alertness: Awake/alert Behavior During Therapy: WFL for tasks assessed/performed Overall Cognitive Status: Within Functional Limits for tasks assessed                     General Comments       Exercises Exercises: Other exercises Other Exercises Other Exercises: worked through HEP, rolling putty 3x's and alternating squeezing with tip, lateral and 3 point pinches.  rolled ball and squeezed--emphasized rolling for weak intrinsics. Also worked on 5 reps of spreading fingers apart and together:  this is very weak.     Shoulder Instructions      Home Living Family/patient expects to be discharged to:: Other (Comment) Living Arrangements: Parent  Additional Comments: lives [facility]      Prior Functioning/Environment Level of Independence: Independent             OT Diagnosis: Generalized weakness   OT Problem List: Decreased strength;Impaired UE functional use;Decreased range of motion;Decreased coordination   OT Treatment/Interventions: Patient/family education;Therapeutic activities;Self-care/ADL training;Therapeutic exercise    OT Goals(Current goals can be found in the care plan section) Acute Rehab OT Goals Patient Stated Goal: to walk OT Goal Formulation: With patient Time For Goal Achievement: 08/14/14 Potential to Achieve Goals: Good ADL Goals Additional ADL Goal #1: pt will be independent with RUE HEP with theraputty and ball Additional ADL Goal #2: pt will be independent with meal set up  Additional ADL Goal #3: pt will use RUE at mod I level for self feeding and grooming tasks (with  built up foam)  OT Frequency: Min 2X/week   Barriers to D/C:            Co-evaluation              End of Session    Activity Tolerance: Patient tolerated treatment well Patient left: in bed;with call bell/phone within reach   Time: [id number] OT Time Calculation (min): 33 min Charges:  OT General Charges $OT Visit: 1 Procedure OT Evaluation $Initial OT Evaluation Tier I: 1 Procedure OT Treatments $Therapeutic Exercise: 23-37 mins G-Codes: OT G-codes **NOT FOR INPATIENT CLASS** Functional Assessment Tool Used: clinical judgment and observation Functional Limitation: Self care Self Care Current Status (N8295(G8987): At least 1 percent but less than 20 percent impaired, limited or restricted Self Care Goal Status (A2130(G8988): 0 percent impaired, limited or restricted  Caliann Leckrone 08/07/2014, 3:01 PM   Marica OtterMaryellen Ronella Plunk, OTR/L [phone] 08/07/2014

## 2014-08-07 NOTE — Clinical Social Work Note (Signed)
CSW attempted to contact Wayne [name] with [facility] team [phone] but he is unavailable at this this time. CSW will attempt to contact him at a later time to discuss patient's progress in treatment.  Wayne [name], MSW, [employer] Clinical Social Worker [facility] [phone]

## 2014-08-07 NOTE — Clinical Social Work Note (Signed)
CSW spoke with patient's [facility] team lead Narda AmberJeff McKay [phone] who reports that patient is often non-compliant with his medications. CSW will continue to be in contact with ACTT team regarding patient's progress and anticipated d/c date in order to coordinate transportation home for patient.  Samuella BruinKristin Shanieka Blea, MSW, [facility] Clinical Social Worker [facility] [phone]'

## 2014-08-07 NOTE — [facility] Group Notes (Signed)
Late Entry from 08/06/14:   [facility]  08/06/14   1:15 PM   Type of Therapy:  Group Therapy  Participation Level:  Active  Participation Quality:  Attentive, Sharing and Supportive  Affect:  Depressed and Flat  Cognitive:  Alert and Oriented  Insight:  Developing/Improving and Engaged  Engagement in Therapy:  Developing/Improving and Engaged  Modes of Intervention:  Clarification, Confrontation, Discussion, Education, Exploration, Limit-setting, Orientation, Problem-solving, Rapport Building, Dance movement psychotherapisteality Testing, Socialization and Support  Summary of Progress/Problems: The topic for group therapy was feelings about diagnosis.  Pt actively participated in group discussion on their past and current diagnosis and how they feel towards this.  Pt also identified how society and family members judge them, based on their diagnosis as well as stereotypes and stigmas.  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 He also shared that he has a history of medication non-compliance. CSW's and other group members provided patient with emotional support and encouragement.  Samuella BruinKristin Marlyn Rabine, MSW, [employer]CSWA Clinical Social Worker [facility] [phone]

## 2014-08-07 NOTE — Progress Notes (Signed)
Pt did not attend group tonight. 

## 2014-08-07 NOTE — [facility] Group Notes (Signed)
   [facility]  08/07/2014  8:45 AM   Participation Quality: Alert, Appropriate and Oriented  Mood/Affect: Depressed and Flat  Depression Rating: Did not feel comfortable rating in group  Anxiety Rating: Did not feel comfortable rating in group  Thoughts of Suicide: Did not feel comfortable discussing in group  Will you contract for safety? Yes  Current AVH: Pt denies  Plan for Discharge/Comments: Pt attended discharge planning group and actively participated in group. CSW provided pt with today's workbook. Patient reports feeling "fine" today. He reported feeling uncomfortable discussing his symptoms or SI/HI today and requested to speak with CSW individually after group.  Transportation Means: Pt reports access to transportation  Supports: Mother has been identified as a support.  Samuella BruinKristin Saragrace Selke, MSW, [employer]CSWA Clinical Social Worker [facility] [phone]

## 2014-08-07 NOTE — [facility] Group Notes (Addendum)
[facility] LCSW Group Therapy 08/07/2014  1:15 PM Type of Therapy: Group Therapy Participation Level: Active  Participation Quality: Attentive, Sharing and Supportive  Affect: Depressed and Flat  Cognitive: Alert and Oriented  Insight: Developing/Improving and Engaged  Engagement in Therapy: Developing/Improving and Engaged  Modes of Intervention: Clarification, Confrontation, Discussion, Education, Exploration, Limit-setting, Orientation, Problem-solving, Rapport Building, Dance movement psychotherapisteality Testing, Socialization and Support  Summary of Progress/Problems: The topic for group today was emotional regulation. This group focused on both positive and negative emotion identification and allowed group members to process ways to identify feelings, regulate negative emotions, and find healthy ways to manage internal/external emotions. Group members were asked to reflect on a time when their reaction to an emotion led to a negative outcome and explored how alternative responses using emotion regulation would have benefited them. Group members were also asked to discuss a time when emotion regulation was utilized when a negative emotion was experienced. 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 Patient reports that he wants to learn not to accept responsibility for things that are beyond his control. CSW's and other group members provided emotional support and encouragement.  Wayne [name], MSW, [employer]CSWA Clinical Social Worker [facility] [phone]

## 2014-08-07 NOTE — Progress Notes (Signed)
NUTRITION ASSESSMENT  Pt identified as at risk on the Malnutrition Screen Tool  INTERVENTION: 1. Educated patient on the importance of nutrition and encouraged intake of food and beverages. 2. Discussed weight goals. 3. Supplements: Glucerna Shake po BID, each supplement provides 220 kcal and 10 grams of protein   NUTRITION DIAGNOSIS: Unintentional weight loss related to sub-optimal intake as evidenced by pt report.   Goal: Pt to meet >/= 90% of their estimated nutrition needs.  Monitor:  PO intake  Assessment:  Pt admitted with depression and substance abuse.  Pt reports weight loss that began in 2014, UBW of 240 lb. Weight history documentation does not support this. Pt has had 5% weight loss since 1 year ago with steady weight loss since 2013.  Pt states he would like to eat better. PTA he reports eating "nothing but junk", "a lot of candy bars, soda, and chips". Pt would like some resources about diabetes diet. Pt states he has attended diabetes education classes in the past. RD to supply diet information to patient.  Pt would like to receive Glucerna supplements while here. RD to order.  Height: Ht Readings from Last 1 Encounters:  08/05/14 6' 0.75" (1.848 m)    Weight: Wt Readings from Last 1 Encounters:  08/05/14 164 lb (74.39 kg)    Weight Hx: Wt Readings from Last 10 Encounters:  08/05/14 164 lb (74.39 kg)  05/15/14 160 lb (72.576 kg)  03/31/14 165 lb (74.844 kg)  01/02/14 164 lb (74.39 kg)  11/12/13 169 lb (76.658 kg)  08/17/13 172 lb (78.019 kg)  06/18/13 175 lb (79.379 kg)  03/26/13 165 lb (74.844 kg)  02/27/13 170 lb (77.111 kg)  05/29/12 190 lb (86.183 kg)    BMI:  Body mass index is 21.78 kg/(m^2). Pt meets criteria for normal range based on current BMI.  Estimated Nutritional Needs: Kcal: 25-30 kcal/kg Protein: > 1 gram protein/kg Fluid: 1 ml/kcal  Diet Order: Diet Carb Modified Pt is also offered choice of unit snacks mid-morning and  mid-afternoon.  Pt is eating as desired.   Lab results and medications reviewed.   Tilda FrancoLindsey Tabor Denham, MS, RD, LDN Pager: [phone] After Hours Pager: [phone]

## 2014-08-07 NOTE — Progress Notes (Signed)
[facility]  08/07/2014 2:51 PM [place]  MRN:  [id number] Subjective:  Wayne [name] states he is still not feeling right. 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 States that he needs help not only with medications but also psychotherapy. Feels that the ACT team he is working with have not help him in that way. Would like to see this as an option to help himself deal with the outside situations if they are not going to change. He is still having a lot of the pain in his feet.  Diagnosis:   DSM5: Substance/Addictive Disorders:  Cocaine use disorder--mild Depressive Disorders:  Major Depressive Disorder - Severe (296.23) Total Time spent with patient: 30 minutes  Axis I: Anxiety Disorder NOS  ADL's:  Intact  Sleep: Fair  Appetite:  Fair  Psychiatric Specialty Exam: Physical Exam  Review of Systems  Constitutional: Negative.   HENT: Negative.   Eyes: Negative.   Respiratory: Negative.   Cardiovascular: Negative.   Gastrointestinal: Negative.   Genitourinary: Negative.   Musculoskeletal:       Neuropathic pain both feet  Skin: Negative.   Neurological: Negative.   Endo/Heme/Allergies: Negative.   Psychiatric/Behavioral: Positive for depression. The patient is nervous/anxious and has insomnia.     Blood pressure 120/70, pulse 89, temperature 98.4 F (36.9 C), temperature source Oral, resp. rate 16, height 6' 0.75" (1.848 m), weight 74.39 kg (164 lb).Body mass index is 21.78 kg/(m^2).  General Appearance: Fairly Groomed  Patent attorneyye Contact::  Fair  Speech:  Clear and Coherent  Volume:  Normal  Mood:  Anxious and worried  Affect:  anxious worried  Thought Process:  Coherent and Goal Directed  Orientation:  Full (Time, Place, and Person)  Thought Content:  symptoms events worries concerns  Suicidal Thoughts:  No  Homicidal Thoughts:  No  Memory:  Immediate;   Fair Recent;   Fair Remote;   Fair  Judgement:  Fair  Insight:  Present  Psychomotor  Activity:  Restlessness  Concentration:  Fair  Recall:  FiservFair  Fund of Knowledge:Fair  Language: Fair  Akathisia:  No  Handed:  Right  AIMS (if indicated):     Assets:  Desire for Improvement Housing Social Support Vocational/Educational  Sleep:  Number of Hours: 6.75   Musculoskeletal: Strength & Muscle Tone: within normal limits Gait & Station: normal but affected by the neuropathy Patient leans: N/A  Current Medications: Current Facility-Administered Medications  Medication Dose Route Frequency Provider Last Rate Last Dose  . acetaminophen (TYLENOL) tablet 650 mg  650 mg Oral Q4H PRN Earney NavyJosephine C Onuoha, NP      . albuterol (PROVENTIL HFA;VENTOLIN HFA) 108 (90 BASE) MCG/ACT inhaler 2 puff  2 puff Inhalation Q6H PRN Earney NavyJosephine C Onuoha, NP      . alum & mag hydroxide-simeth (MAALOX/MYLANTA) 200-200-20 MG/5ML suspension 30 mL  30 mL Oral PRN Earney NavyJosephine C Onuoha, NP      . ARIPiprazole (ABILIFY) tablet 10 mg  10 mg Oral BID Sanjuana KavaAgnes I Nwoko, NP   10 mg at 08/07/14 0813  . aspirin EC tablet 81 mg  81 mg Oral Daily Earney NavyJosephine C Onuoha, NP   81 mg at 08/07/14 [id number]  . atenolol (TENORMIN) tablet 25 mg  25 mg Oral Daily Earney NavyJosephine C Onuoha, NP   25 mg at 08/07/14 [id number]  . budesonide-formoterol (SYMBICORT) 80-4.5 MCG/ACT inhaler 2 puff  2 puff Inhalation BID Earney NavyJosephine C Onuoha, NP   2 puff at  08/07/14 0813  . buPROPion [facility] SR) 12 hr tablet 150 mg  150 mg Oral BID Rachael Fee, MD      . Melene Muller ON 08/08/2014] buPROPion (WELLBUTRIN XL) 24 hr tablet 300 mg  300 mg Oral Daily Rachael Fee, MD      . ezetimibe (ZETIA) tablet 10 mg  10 mg Oral Daily Earney Navy, NP   10 mg at 08/07/14 8119  . feeding supplement (GLUCERNA SHAKE) (GLUCERNA SHAKE) liquid 237 mL  237 mL Oral BID BM Tilda Franco, RD      . fluticasone (FLONASE) 50 MCG/ACT nasal spray 2 spray  2 spray Each Nare Daily Earney Navy, NP   2 spray at 08/06/14 0824  . gabapentin (NEURONTIN) capsule 300 mg  300 mg Oral QID  Sanjuana Kava, NP   300 mg at 08/07/14 1210  . gabapentin (NEURONTIN) capsule 300 mg  300 mg Oral QHS Rachael Fee, MD      . insulin aspart (novoLOG) injection 0-15 Units  0-15 Units Subcutaneous TID WC Sanjuana Kava, NP   5 Units at 08/07/14 1212  . insulin aspart (novoLOG) injection 10 Units  10 Units Subcutaneous TID WC Earney Navy, NP   10 Units at 08/07/14 1213  . insulin glargine (LANTUS) injection 45 Units  45 Units Subcutaneous QHS Earney Navy, NP   45 Units at 08/06/14 2202  . loratadine (CLARITIN) tablet 10 mg  10 mg Oral Daily Earney Navy, NP   10 mg at 08/07/14 1478  . LORazepam (ATIVAN) tablet 1 mg  1 mg Oral Q8H PRN Earney Navy, NP      . losartan (COZAAR) tablet 25 mg  25 mg Oral Daily Earney Navy, NP   25 mg at 08/07/14 2956  . metFORMIN (GLUCOPHAGE) tablet 1,000 mg  1,000 mg Oral BID WC Rachael Fee, MD   1,000 mg at 08/07/14 2130  . methocarbamol (ROBAXIN) tablet 500 mg  500 mg Oral Q8H PRN Sanjuana Kava, NP      . multivitamin with minerals tablet 1 tablet  1 tablet Oral Daily Tilda Franco, RD      . nicotine (NICODERM CQ - dosed in mg/24 hours) patch 21 mg  21 mg Transdermal Daily Earney Navy, NP   21 mg at 08/07/14 0800  . ondansetron (ZOFRAN) tablet 4 mg  4 mg Oral Q8H PRN Earney Navy, NP      . pantoprazole (PROTONIX) EC tablet 40 mg  40 mg Oral Daily Earney Navy, NP   40 mg at 08/07/14 8657  . sertraline (ZOLOFT) tablet 100 mg  100 mg Oral QHS Rachael Fee, MD      . zolpidem [facility]) tablet 5 mg  5 mg Oral QHS PRN Earney Navy, NP   5 mg at 08/06/14 2159    Lab Results:  Results for orders placed or performed during the hospital encounter of 08/05/14 (from the past 48 hour(s))  Glucose, capillary     Status: Abnormal   Collection Time: 08/05/14 10:03 PM  Result Value Ref Range   Glucose-Capillary 343 (H) 70 - 99 mg/dL  Glucose, capillary     Status: Abnormal   Collection Time: 08/06/14  5:58 AM   Result Value Ref Range   Glucose-Capillary 371 (H) 70 - 99 mg/dL  Glucose, capillary     Status: Abnormal   Collection Time: 08/06/14 12:05 PM  Result Value Ref Range  Glucose-Capillary 392 (H) 70 - 99 mg/dL  Glucose, capillary     Status: Abnormal   Collection Time: 08/06/14  4:51 PM  Result Value Ref Range   Glucose-Capillary 115 (H) 70 - 99 mg/dL  Glucose, capillary     Status: Abnormal   Collection Time: 08/06/14  9:18 PM  Result Value Ref Range   Glucose-Capillary 161 (H) 70 - 99 mg/dL   Comment 1 Notify RN   Glucose, capillary     Status: Abnormal   Collection Time: 08/07/14  6:02 AM  Result Value Ref Range   Glucose-Capillary 223 (H) 70 - 99 mg/dL   Comment 1 Notify RN   Glucose, capillary     Status: Abnormal   Collection Time: 08/07/14 11:42 AM  Result Value Ref Range   Glucose-Capillary 234 (H) 70 - 99 mg/dL    Physical Findings: AIMS: Facial and Oral Movements Muscles of Facial Expression: None, normal Lips and Perioral Area: None, normal Jaw: None, normal Tongue: None, normal,Extremity Movements Upper (arms, wrists, hands, fingers): None, normal Lower (legs, knees, ankles, toes): None, normal, Trunk Movements Neck, shoulders, hips: None, normal, Overall Severity Severity of abnormal movements (highest score from questions above): None, normal Incapacitation due to abnormal movements: None, normal Patient's awareness of abnormal movements (rate only patient's report): No Awareness, Dental Status Current problems with teeth and/or dentures?: No Does patient usually wear dentures?: No  CIWA:    COWS:     Treatment Plan Summary: Daily contact with patient to assess and evaluate symptoms and progress in treatment Medication management  Plan: Supportive approach/coping skills/relapse prevention           Depression: Will change the Wellbutrin SR BID to Wellbutrin XL 300 mg in AM                                  Will add back the Zoloft at 100 mg daily                                   Will continue to work on CBT/mindfulness better ways to handle stress           Neuropathy: will increase the Neurontin to 300 mg QID           Cocaine: will continue to work a relapse prevention plan             Medical Decision Making Problem Points:  Review of psycho-social stressors (1) Data Points:  Review or order clinical lab tests (1) Review of medication regiment & side effects (2) Review of new medications or change in dosage (2)  I certify that inpatient services furnished can reasonably be expected to improve the patient's condition.   Montie Gelardi A 08/07/2014, 2:51 PM

## 2014-08-08 LAB — GLUCOSE, CAPILLARY
Glucose-Capillary: 213 mg/dL — ABNORMAL HIGH (ref 70–99)
Glucose-Capillary: 266 mg/dL — ABNORMAL HIGH (ref 70–99)
Glucose-Capillary: 167 mg/dL — ABNORMAL HIGH (ref 70–99)
Glucose-Capillary: 230 mg/dL — ABNORMAL HIGH (ref 70–99)

## 2014-08-08 MED ORDER — LOPERAMIDE HCL 2 MG PO CAPS: 2.0000 mg | ORAL_CAPSULE | ORAL | Status: DC | PRN

## 2014-08-08 MED ORDER — LOPERAMIDE HCL 2 MG PO CAPS
4.0000 mg | ORAL_CAPSULE | Freq: Once | ORAL | Status: AC
  Administered 2014-08-08: 4 mg via ORAL
  Filled 2014-08-08 (×2): qty 2

## 2014-08-08 NOTE — Progress Notes (Signed)
The patient did not attend the Karaoke group this evening and remained in his room.

## 2014-08-08 NOTE — Progress Notes (Signed)
Pt has been in his room most of the evening.  He did not attend evening group.  Pt reports he is doing "about the same".  He denies SI/HI/AV at this time, but says he is still depressed.  He is using his walker to get around the unit.  He says he has been watching what he eats, but his hs CBG was 281.  Pt was encouraged to make his needs known to staff.  Pt was encouraged to attend groups to participate in his treatment.  Discharge plans still in process.  Support and encouragement offered.  Safety maintained with q15 minute checks.

## 2014-08-08 NOTE — Progress Notes (Signed)
Patient ID: Sharman CheekAsmar A Stockman, male   DOB: 06/17/1976, 39 y.o.   MRN: [id number]  Adult Psychoeducational Group Note  Date:  08/08/2014 Time:  09:45  Group Topic/Focus:  Orientation:   The focus of this group is to educate the patient on the purpose and policies of crisis stabilization and provide a format to answer questions about their admission.  The group details unit policies and expectations of patients while admitted.  Participation Level:  Active  Participation Quality:  Appropriate  Affect:  Appropriate  Cognitive:  Alert and Oriented  Insight: Appropriate  Engagement in Group:  Engaged  Modes of Intervention:  Discussion, Education, Orientation and Support  Additional Comments:  Pt able to identify one daily goal to accomplish today.   Aurora Maskwyman, Dorice Stiggers E 08/08/2014, 12:35 PM

## 2014-08-08 NOTE — Progress Notes (Signed)
Patient ID: Sharman CheekAsmar A Majette, male   DOB: 04/24/1976, 39 y.o.   MRN: [id number]   Pt currently presents with a blunted affect and cooperative behavior. Per self inventory, pt rates depression at a 2, hopelessness 0 and anxiety 0. Pt's daily goal is to "hands getting better" and they intend to do so by "using them." Pt reports good sleep, concentration and appetite. Pt also reports low energy. Pt reports slight pain in hands with increased use.   Pt provided with medications per providers orders. Pt's labs and vitals were monitored throughout the day. Pt supported emotionally and encouraged to express concerns and questions. Pt educated on range of motion exercises and medications. Pt able to use theraputty and other OT resources this morning. Pt able to legibly sign his name with pen.   Pt's safety ensured with 15 minute and environmental checks. Pt currently denies SI/HI and A/V hallucinations. Pt verbally agrees to seek staff if SI/HI or A/VH occurs and to consult with staff before acting on these thoughts.

## 2014-08-08 NOTE — Tx Team (Signed)
  Interdisciplinary Treatment Plan Update   Date Reviewed:  08/08/2014  Time Reviewed:  4:30 PM  Progress in Treatment:   Attending groups: Yes Participating in groups: Yes Taking medication as prescribed: Yes  Tolerating medication: Yes Family/Significant other contact made: Yes  Patient understands diagnosis: Yes  Discussing patient identified problems/goals with staff: Yes  See initial care plan Medical problems stabilized or resolved: Yes Denies suicidal/homicidal ideation: Yes  In tx team Patient has not harmed self or others: Yes  For review of initial/current patient goals, please see plan of care.  Estimated Length of Stay:  Likely d/c tomorrow  Reason for Continuation of Hospitalization:   New Problems/Goals identified:  N/A  Discharge Plan or Barriers:   return home, follow up with ACT team  Additional Comments:  Attendees:  Signature: Ivin BootySarama Eappen, MD 08/08/2014 4:30 PM   Signature: Richelle Itood Atzin Buchta, LCSW 08/08/2014 4:30 PM  Signature:  08/08/2014 4:30 PM  Signature: Joslyn Devonaroline Beaudry, RN 08/08/2014 4:30 PM  Signature:  08/08/2014 4:30 PM  Signature:  08/08/2014 4:30 PM  Signature:   08/08/2014 4:30 PM  Signature:    Signature:    Signature:    Signature:    Signature:    Signature:      Scribe for Treatment Team:   Richelle Itood Keanen Dohse, LCSW  08/08/2014 4:30 PM

## 2014-08-08 NOTE — Progress Notes (Signed)
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 He also admits that the has not been as compliant with his medications. Able to say that in the past he got dependent on cocaine for energy in order to do the things he was required to do. He continues to affirm that he last used weeks ago while the UDS is pos for cocaine 3 days ago. He states that he thinks that if he gets the individual therapy that he is requesting from his ACT and complies with his medications he is going to be able to make it. Diagnosis:   DSM5: Substance/Addictive Disorders:  Cocaine moderate Depressive Disorders:  Major Depressive Disorder - Severe (296.23) Total Time spent with patient: 30 minutes  Axis I: Generalized Anxiety Disorder  ADL's:  Intact  Sleep: Fair  Appetite:  Fair  Psychiatric Specialty Exam: Physical Exam  Review of Systems  Constitutional: Negative.   HENT: Negative.   Eyes: Negative.   Respiratory: Negative.   Cardiovascular: Negative.   Gastrointestinal: Negative.   Genitourinary: Negative.   Musculoskeletal: Negative.        Neuropathy   Skin: Negative.   Neurological: Negative.   Endo/Heme/Allergies: Negative.   Psychiatric/Behavioral: Positive for depression and substance abuse. The patient is nervous/anxious.     Blood pressure 134/89, pulse 94, temperature 98.4 F (36.9 C), temperature source Oral, resp. rate 16, height 6' 0.75" (1.848 m), weight 74.39 kg (164 lb).Body mass index is 21.78 kg/(m^2).  General Appearance: Fairly Groomed  Patent attorneyye Contact::  Fair  Speech:  Clear and Coherent  Volume:  Normal  Mood:  Anxious and worried  Affect:  anxious worried  Thought Process:  Coherent and Goal Directed  Orientation:  Full (Time, Place, and Person)  Thought Content:  events that led to this admission, plans as he moves forward   Suicidal Thoughts:  No  Homicidal Thoughts:  No  Memory:  Immediate;   Fair Recent;   Fair Remote;   Fair  Judgement:  Fair  Insight:  Present and Shallow  Psychomotor Activity:  Restlessness  Concentration:  Fair  Recall:  FiservFair  Fund of Knowledge:Fair  Language: Fair  Akathisia:  No  Handed:  Right  AIMS (if indicated):     Assets:  Desire for Improvement Housing Vocational/Educational  Sleep:  Number of Hours: 6.75   Musculoskeletal: Strength & Muscle Tone: within normal limits Gait & Station: affected by the neuropathy Patient leans: Right  Current Medications: Current Facility-Administered Medications  Medication Dose Route Frequency Provider Last Rate Last Dose  . acetaminophen (TYLENOL) tablet 650 mg  650 mg Oral Q4H PRN Earney NavyJosephine C Onuoha, NP      . albuterol (PROVENTIL HFA;VENTOLIN HFA) 108 (90 BASE) MCG/ACT inhaler 2 puff  2 puff Inhalation Q6H PRN Earney NavyJosephine C Onuoha, NP      . alum & mag hydroxide-simeth (MAALOX/MYLANTA) 200-200-20 MG/5ML suspension 30 mL  30 mL Oral PRN Earney NavyJosephine C Onuoha, NP      . ARIPiprazole (ABILIFY) tablet 10 mg  10 mg Oral BID Sanjuana KavaAgnes I Nwoko, NP   10 mg at 08/08/14 0931  . aspirin EC tablet 81 mg  81 mg Oral Daily Earney NavyJosephine C Onuoha, NP   81 mg at 08/08/14 0929  . atenolol (TENORMIN) tablet 25 mg  25 mg Oral Daily Earney NavyJosephine C Onuoha, NP  25 mg at 08/08/14 0928  . budesonide-formoterol (SYMBICORT) 80-4.5 MCG/ACT inhaler 2 puff  2 puff Inhalation BID Earney Navy, NP   2 puff at 08/08/14 0934  . buPROPion (WELLBUTRIN XL) 24 hr tablet 300 mg  300 mg Oral Daily Rachael Fee, MD   300 mg at 08/08/14 0930  . ezetimibe (ZETIA) tablet 10 mg  10 mg Oral Daily Earney Navy, NP   10 mg at 08/08/14 0929  . feeding supplement (GLUCERNA SHAKE) (GLUCERNA SHAKE) liquid 237 mL  237 mL Oral BID BM Tilda Franco, RD   237 mL at 08/08/14 1007  . fluticasone (FLONASE) 50 MCG/ACT nasal spray 2 spray  2 spray Each Nare Daily Earney Navy, NP   2  spray at 08/08/14 0927  . gabapentin (NEURONTIN) capsule 300 mg  300 mg Oral QID Sanjuana Kava, NP   300 mg at 08/08/14 1208  . gabapentin (NEURONTIN) capsule 300 mg  300 mg Oral QHS Rachael Fee, MD   300 mg at 08/07/14 2140  . insulin aspart (novoLOG) injection 0-15 Units  0-15 Units Subcutaneous TID WC Sanjuana Kava, NP   8 Units at 08/08/14 1206  . insulin aspart (novoLOG) injection 10 Units  10 Units Subcutaneous TID WC Earney Navy, NP   10 Units at 08/08/14 1207  . insulin glargine (LANTUS) injection 45 Units  45 Units Subcutaneous QHS Earney Navy, NP   45 Units at 08/07/14 2141  . loratadine (CLARITIN) tablet 10 mg  10 mg Oral Daily Earney Navy, NP   10 mg at 08/08/14 0928  . LORazepam (ATIVAN) tablet 1 mg  1 mg Oral Q8H PRN Earney Navy, NP      . losartan (COZAAR) tablet 25 mg  25 mg Oral Daily Earney Navy, NP   25 mg at 08/08/14 0929  . metFORMIN (GLUCOPHAGE) tablet 1,000 mg  1,000 mg Oral BID WC Rachael Fee, MD   1,000 mg at 08/08/14 9604  . methocarbamol (ROBAXIN) tablet 500 mg  500 mg Oral Q8H PRN Sanjuana Kava, NP      . multivitamin with minerals tablet 1 tablet  1 tablet Oral Daily Tilda Franco, RD   1 tablet at 08/08/14 0930  . nicotine (NICODERM CQ - dosed in mg/24 hours) patch 21 mg  21 mg Transdermal Daily Earney Navy, NP   21 mg at 08/07/14 0800  . ondansetron (ZOFRAN) tablet 4 mg  4 mg Oral Q8H PRN Earney Navy, NP      . pantoprazole (PROTONIX) EC tablet 40 mg  40 mg Oral Daily Earney Navy, NP   40 mg at 08/08/14 0930  . sertraline (ZOLOFT) tablet 100 mg  100 mg Oral QHS Rachael Fee, MD   100 mg at 08/07/14 2140  . zolpidem (AMBIEN) tablet 5 mg  5 mg Oral QHS PRN Earney Navy, NP   5 mg at 08/06/14 2159    Lab Results:  Results for orders placed or performed during the hospital encounter of 08/05/14 (from the past 48 hour(s))  Glucose, capillary     Status: Abnormal   Collection Time: 08/06/14  4:51 PM   Result Value Ref Range   Glucose-Capillary 115 (H) 70 - 99 mg/dL  Glucose, capillary     Status: Abnormal   Collection Time: 08/06/14  9:18 PM  Result Value Ref Range   Glucose-Capillary 161 (H) 70 - 99 mg/dL   Comment 1  Notify RN   Glucose, capillary     Status: Abnormal   Collection Time: 08/07/14  6:02 AM  Result Value Ref Range   Glucose-Capillary 223 (H) 70 - 99 mg/dL   Comment 1 Notify RN   Glucose, capillary     Status: Abnormal   Collection Time: 08/07/14 11:42 AM  Result Value Ref Range   Glucose-Capillary 234 (H) 70 - 99 mg/dL  Glucose, capillary     Status: Abnormal   Collection Time: 08/07/14  5:03 PM  Result Value Ref Range   Glucose-Capillary 243 (H) 70 - 99 mg/dL  Glucose, capillary     Status: Abnormal   Collection Time: 08/07/14  9:06 PM  Result Value Ref Range   Glucose-Capillary 281 (H) 70 - 99 mg/dL  Glucose, capillary     Status: Abnormal   Collection Time: 08/08/14  6:18 AM  Result Value Ref Range   Glucose-Capillary 213 (H) 70 - 99 mg/dL  Glucose, capillary     Status: Abnormal   Collection Time: 08/08/14 11:52 AM  Result Value Ref Range   Glucose-Capillary 266 (H) 70 - 99 mg/dL   Comment 1 Notify RN     Physical Findings: AIMS: Facial and Oral Movements Muscles of Facial Expression: None, normal Lips and Perioral Area: None, normal Jaw: None, normal Tongue: None, normal,Extremity Movements Upper (arms, wrists, hands, fingers): None, normal Lower (legs, knees, ankles, toes): None, normal, Trunk Movements Neck, shoulders, hips: None, normal, Overall Severity Severity of abnormal movements (highest score from questions above): None, normal Incapacitation due to abnormal movements: None, normal Patient's awareness of abnormal movements (rate only patient's report): No Awareness, Dental Status Current problems with teeth and/or dentures?: No Does patient usually wear dentures?: No  CIWA:    COWS:     Treatment Plan Summary: Daily contact with  patient to assess and evaluate symptoms and progress in treatment Medication management  Plan: Supportive approach/coping skills/relapse prevention           Depression: optimize response to the Zoloft/Wellbutrin combination           Anxiety: use mindfulness, CBT strategies to address the anxiety           Cocaine abuse: continue to work the relapse prevention plan           Diabetes Mellitus: ask the Diabetes Coordinator to help with a general orientation about his                    insulin, administration strategies given his being afraid of needles Medical Decision Making Problem Points:  Review of psycho-social stressors (1) Data Points:  Review of medication regiment & side effects (2) Review of new medications or change in dosage (2)  I certify that inpatient services furnished can reasonably be expected to improve the patient's condition.   Zaeem Kandel A 08/08/2014, 2:31 PM

## 2014-08-08 NOTE — Progress Notes (Signed)
Inpatient Diabetes Program Recommendations  AACE/ADA: New Consensus Statement on Inpatient Glycemic Control (2013)  Target Ranges:  Prepandial:   less than 140 mg/dL      Peak postprandial:   less than 180 mg/dL (1-2 hours)      Critically ill patients:  140 - 180 mg/dL   Reason for Assessment: Hyperglycemia  Diabetes history: DM2 Outpatient Diabetes medications: Lantus 45 units QHS, glipizide 10 mg QD, Humalog 10 units tidwc, metformin 2000 mg bid Current orders for Inpatient glycemic control: Lantus 45 units QHS, Novolog 10 units tidwc + moderate tidwc.   Results for Wayne [name], Wayne [name] (MRN [id number]) as of 08/08/2014 16:13  Ref. Range 08/04/2014 22:30  Glucose Latest Range: 70-99 mg/dL 409562 [facility])  Results for Wayne [name], Wayne [name] (MRN [id number]) as of 08/08/2014 16:13  Ref. Range 08/07/2014 11:42 08/07/2014 17:03 08/07/2014 21:06 08/08/2014 06:18 08/08/2014 11:52  Glucose-Capillary Latest Range: 70-99 mg/dL 956234 (H) 213243 (H) 086281 (H) 213 (H) 266 (H)   HgbA1C pending. Uncontrolled blood sugars. Non-compliance with insulin and OHAs.  Recommendations: Increase Novolog to 14 units tidwc if pt eats full meal Add HS correction. May need to reorder HgbA1C.  Will follow. Thank you. Ailene Ardshonda Tawana Pasch, RD, LDN, CDE Inpatient Diabetes Coordinator [phone]

## 2014-08-08 NOTE — [facility] Group Notes (Signed)
[facility] LCSW Group Therapy  [facility] 1:15 - 2:30 PM  08/08/2014   Type of Therapy:  Group Therapy  Participation Level: Active  Participation Quality:  Attentive  Affect:  Appropriate  Cognitive:  Appropriate  Insight:  Developing/Improving   Engagement in Therapy:  Developing/Improving   Modes of Intervention:  Discussion, Education, Exploration, Problem-Solving, Rapport Building, Support   Summary of Progress/Problems:   Patient was attentive to speaker from the [facility] as he shared his story of dealing with mental health/substance abuse issues and overcoming it by working a recovery program.  Patient expressed interest in their programs and services and received information on their agency. Patient thanked speaker for sharing his story with the group.  Wynn BankerHodnett, Florentine Diekman Hairston 08/08/2014   \

## 2014-08-08 NOTE — Progress Notes (Signed)
OT Note:  Spoke to RN, Maralyn SagoSarah, who reports that pt is performing putty and ball exercises, and he feels these are helping.  Will try to return tomorrow to check progress and see if program can be upgraded.  MontezumaMaryellen Anays [place], OTR/L [phone] 08/08/2014

## 2014-08-08 NOTE — Plan of Care (Signed)
Problem: Alteration in mood Goal: STG-Patient is able to discuss feelings and issues (Patient is able to discuss feelings and issues leading to depression)  Outcome: Completed/Met Date Met:  08/08/14 Pt works with same nurse multiple times.

## 2014-08-08 NOTE — Plan of Care (Signed)
Problem: Ineffective individual coping Goal: STG-Increase in ability to manage activities of daily living Outcome: Progressing Pt spends more time on self care. Pt attends and participates in groups.

## 2014-08-09 LAB — GLUCOSE, CAPILLARY: Glucose-Capillary: 212 mg/dL — ABNORMAL HIGH (ref 70–99)

## 2014-08-09 MED ORDER — FLUTICASONE PROPIONATE 50 MCG/ACT NA SUSP: 2.0000 | Freq: Every day | NASAL | Status: DC

## 2014-08-09 MED ORDER — GABAPENTIN 300 MG PO CAPS: 300.0000 mg | ORAL_CAPSULE | Freq: Every day | ORAL | Status: DC

## 2014-08-09 MED ORDER — ZOLPIDEM TARTRATE 5 MG PO TABS: 5.0000 mg | ORAL_TABLET | Freq: Every evening | ORAL | Status: DC | PRN

## 2014-08-09 MED ORDER — SERTRALINE HCL 100 MG PO TABS: 100.0000 mg | ORAL_TABLET | Freq: Every day | ORAL | Status: DC

## 2014-08-09 MED ORDER — INSULIN GLARGINE 100 UNIT/ML ~~LOC~~ SOLN: 45.0000 [IU] | Freq: Every day | SUBCUTANEOUS | Status: DC

## 2014-08-09 MED ORDER — ARIPIPRAZOLE 10 MG PO TABS: 10.0000 mg | ORAL_TABLET | Freq: Two times a day (BID) | ORAL | Status: DC

## 2014-08-09 MED ORDER — ADULT MULTIVITAMIN W/MINERALS CH: 1.0000 | ORAL_TABLET | Freq: Every day | ORAL | Status: DC

## 2014-08-09 MED ORDER — INSULIN LISPRO 100 UNIT/ML ~~LOC~~ SOLN: 10.0000 [IU] | Freq: Three times a day (TID) | SUBCUTANEOUS | Status: DC

## 2014-08-09 MED ORDER — ASPIRIN EC 81 MG PO TBEC: 81.0000 mg | DELAYED_RELEASE_TABLET | Freq: Every day | ORAL | Status: DC

## 2014-08-09 MED ORDER — EZETIMIBE 10 MG PO TABS: 10.0000 mg | ORAL_TABLET | Freq: Every day | ORAL | Status: DC

## 2014-08-09 MED ORDER — ATENOLOL 25 MG PO TABS: 25.0000 mg | ORAL_TABLET | Freq: Every day | ORAL | Status: DC

## 2014-08-09 MED ORDER — LORATADINE 10 MG PO TABS: 10.0000 mg | ORAL_TABLET | Freq: Every day | ORAL | Status: DC

## 2014-08-09 MED ORDER — ESOMEPRAZOLE MAGNESIUM 40 MG PO CPDR: 40.0000 mg | DELAYED_RELEASE_CAPSULE | Freq: Every day | ORAL | Status: DC

## 2014-08-09 MED ORDER — GABAPENTIN 300 MG PO CAPS
300.0000 mg | ORAL_CAPSULE | Freq: Every day | ORAL | Status: DC
  Administered 2014-08-09: 300 mg via ORAL
  Filled 2014-08-09 (×10): qty 1

## 2014-08-09 MED ORDER — BUPROPION HCL ER (XL) 300 MG PO TB24: 300.0000 mg | ORAL_TABLET | Freq: Every day | ORAL | Status: DC

## 2014-08-09 MED ORDER — BUDESONIDE-FORMOTEROL FUMARATE 80-4.5 MCG/ACT IN AERO: 2.0000 | INHALATION_SPRAY | Freq: Two times a day (BID) | RESPIRATORY_TRACT | Status: DC

## 2014-08-09 MED ORDER — LOSARTAN POTASSIUM 25 MG PO TABS: 25.0000 mg | ORAL_TABLET | Freq: Every day | ORAL | Status: DC

## 2014-08-09 MED ORDER — METFORMIN HCL 1000 MG PO TABS: 1000.0000 mg | ORAL_TABLET | Freq: Two times a day (BID) | ORAL | Status: DC

## 2014-08-09 MED ORDER — FREESTYLE SYSTEM KIT: 1.0000 | PACK | Status: DC | PRN

## 2014-08-09 NOTE — Plan of Care (Signed)
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 "everybody was fussing got on my nerves, so I called my ACT team and told them how I was feeling and they told me to come in" Client currently denies suicidal thoughts.

## 2014-08-09 NOTE — Progress Notes (Signed)
[facility] :  Will you be returning to the same living situation after discharge: Yes,  returning home At discharge, do you have transportation home?:Yes,  [facility] team will transport at 3PM Do you have the ability to pay for your medications:Yes,  has [employer] insurance per [facility], patient also has ACT team to assist if needed  Release of information consent forms completed and in the chart;  Patient's signature needed at discharge.  Patient to Follow up at: Follow-up Information    [facility] with [facility].   Why:  They will pick you up at 3 PM today.     Contact information:   [address],  [place], [place] [address] [phone] Fax: [phone]      Patient denies SI/HI:   Yes,  AEB patient rating both as 0    Safety Planning and Suicide Prevention discussed:  Yes,  discussed in discharge planning group w all patients.   N/A patient is not a smoker  Sallee LangeCunningham, Anne C 08/09/2014, 12:25 PM

## 2014-08-09 NOTE — Discharge Summary (Signed)
Physician Discharge Summary Note  Patient:  Wayne [name] is an 39 y.o., male MRN:  [id number] DOB:  29-Nov-1975 Patient phone:  [phone] (home)  Patient address:   [place] [address],  Total Time spent with patient: 30 minutes  Date of Admission:  08/05/2014 Date of Discharge: 08/09/14  Reason for Admission:  Mood stabilization treatments  Discharge Diagnoses: Principal Problem:   Recurrent major depression-severe Active Problems:   Major depressive disorder, recurrent, severe without psychotic features   Psychiatric Specialty Exam: Physical Exam  Psychiatric: He has a normal mood and affect. His speech is normal and behavior is normal. Judgment and thought content normal. Cognition and memory are normal.    Review of Systems  Constitutional: Negative.   HENT: Negative.   Eyes: Negative.   Respiratory: Negative.   Cardiovascular: Negative.   Gastrointestinal: Negative.   Genitourinary: Negative.   Musculoskeletal: Negative.   Skin: Negative.   Neurological: Negative.   Endo/Heme/Allergies: Negative.   Psychiatric/Behavioral: Positive for depression (Stabilized with treatment).    Blood pressure 121/80, pulse 88, temperature 98.4 F (36.9 C), temperature source Oral, resp. rate 18, height 6' 0.75" (1.848 m), weight 74.39 kg (164 lb).Body mass index is 21.78 kg/(m^2).  See Physician SRA     Past Psychiatric History: Diagnosis: Major depressive disorder, recurrent episodes  Hospitalizations: [facility] adult unit x 4, [facility] x s  Outpatient Care: [facility]  Substance Abuse Care: Denies  Self-Mutilation: Denies  Suicidal Attempts: Denies  Violent Behaviors: NA   Musculoskeletal: Strength & Muscle Tone: within normal limits Gait & Station: affected by the neuropathy Patient leans: Right  DSM5:  Axis Diagnosis:   AXIS I: Major Depression recurrent severe without psychotic symptoms AXIS II: No diagnosis AXIS III:   Past Medical History  Diagnosis Date  . Depression   . Diabetes mellitus   . Hyperlipidemia   . HTN (hypertension)   . Chronic headaches   . Osteoarthritis   . Kidney stones   . Polysubstance abuse     cocaine, marijuana-quit summer 2010  . Tobacco abuse   . Asthma   . Chest pain   . Lumbar disc disease   . GERD (gastroesophageal reflux disease)   . OSA (obstructive sleep apnea)   . Peripheral neuropathy 02/01/2011  . Bipolar 1 disorder   . Ingrown nail 11/12/2013   AXIS IV: other psychosocial or environmental problems AXIS V: 61-70 mild symptoms  Level of Care:  OP  Hospital Course:  Wayne [name] is an 39 y.o. male, single, African-American who presents to [facility] ED accompanied by mobile crisis.  Pt has a history of depression and states he has been increasingly depressed recently. He says tonight he had a conflict with his niece and wanted to choke her. He says he knew if he did this that he and his brother would physically fight until one of them was dead, "so I figure I may as well just kill myself." Pt states, "I'm just tired all the time, just tired of living." Pt denies current suicidal intent but reports he has attempted suicide three times in the past by intentionally overdosing on insulin. He reports increasing depressive symptoms including crying spells, decreased sleep, decreased appetite, loss of interest in usual pleasures, anhedonia and feelings of hopelessness. He reports significant weight loss. He says he feels very anxious all the time.  Pt identifies several stressors. He says when he becomes depressed he stops taking his diabetes  and psychiatric medications, which makes him feels more depressed, which makes him not want to take his medications or do anything. Pt reports he has been off his medications for a couple of weeks. He says he cannot keep his diabetes under control. 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 He says [employer] is a family business and he can never get away from work. He feels trapped in this job because he is on probation for obtaining property under false pretenses and due to family obligation. He lives with his mother and feels he has no one in his life who is supportive. He says his sister has a history of schizophrenia. Pt is currently receiving outpatient psychiatric treatment through Dr. Timmothy Euler at [facility]. Pt states he last saw his psychiatrist a couple of weeks ago. He reports he was hospitalized at [facility] in 2013 due to depression and suicidal ideation.         Wayne [name] was admitted to the adult unit. He was evaluated and his symptoms were identified. His Wellbutrin was changed to XL 300 mg daily for depression. He was also started on Abilify to augment his treatment for depression. Patient was started on Neurontin 300 mg five times daily to help with diabetic neuropathy, anxiety, and mood control.  Medication management was discussed and initiated. He was oriented to the unit and encouraged to participate in unit programming. Medical problems were identified and treated appropriately. A diabetic consult was obtained for recommendations for better diabetic control. Home medication was restarted as needed.        The patient was evaluated each day by a clinical provider to ascertain the patient's response to treatment.  Improvement was noted by the patient's report of decreasing symptoms, improved sleep and appetite, affect, medication tolerance, behavior, and participation in unit programming.  He was asked each day to complete a self inventory noting mood, mental status, pain, new symptoms, anxiety and concerns.         He responded well to medication and being in a therapeutic and supportive environment. Positive and appropriate behavior was noted and the patient was motivated  for recovery.  The patient worked closely with the treatment team and case manager to develop a discharge plan with appropriate goals. Coping skills, problem solving as well as relaxation therapies were also part of the unit programming.         By the day of discharge he was in much improved condition than upon admission.  Symptoms were reported as significantly decreased or resolved completely. The patient denied SI/HI and voiced no AVH. He was motivated to continue taking medication with a goal of continued improvement in mental health.  Wayne [name] was discharged home with a plan to follow up as noted below. On discharge the social worker set up for the [facility] to call patient for appointment.   Consults:  psychiatry  Significant Diagnostic Studies:  Chemistry panel, CBC, Daily blood glucose monitoring   Discharge Vitals:   Blood pressure 121/80, pulse 88, temperature 98.4 F (36.9 C), temperature source Oral, resp. rate 18, height 6' 0.75" (1.848 m), weight 74.39 kg (164 lb). Body mass index is 21.78 kg/(m^2). Lab Results:   Results for orders placed or performed during the hospital encounter of 08/05/14 (from the past 72 hour(s))  Glucose, capillary     Status:  Abnormal   Collection Time: 08/06/14  4:51 PM  Result Value Ref Range   Glucose-Capillary 115 (H) 70 - 99 mg/dL  Glucose, capillary     Status: Abnormal   Collection Time: 08/06/14  9:18 PM  Result Value Ref Range   Glucose-Capillary 161 (H) 70 - 99 mg/dL   Comment 1 Notify RN   Glucose, capillary     Status: Abnormal   Collection Time: 08/07/14  6:02 AM  Result Value Ref Range   Glucose-Capillary 223 (H) 70 - 99 mg/dL   Comment 1 Notify RN   Glucose, capillary     Status: Abnormal   Collection Time: 08/07/14 11:42 AM  Result Value Ref Range   Glucose-Capillary 234 (H) 70 - 99 mg/dL  Glucose, capillary     Status: Abnormal   Collection Time: 08/07/14  5:03 PM  Result Value Ref Range    Glucose-Capillary 243 (H) 70 - 99 mg/dL  Glucose, capillary     Status: Abnormal   Collection Time: 08/07/14  9:06 PM  Result Value Ref Range   Glucose-Capillary 281 (H) 70 - 99 mg/dL  Glucose, capillary     Status: Abnormal   Collection Time: 08/08/14  6:18 AM  Result Value Ref Range   Glucose-Capillary 213 (H) 70 - 99 mg/dL  Glucose, capillary     Status: Abnormal   Collection Time: 08/08/14 11:52 AM  Result Value Ref Range   Glucose-Capillary 266 (H) 70 - 99 mg/dL   Comment 1 Notify RN   Glucose, capillary     Status: Abnormal   Collection Time: 08/08/14  4:59 PM  Result Value Ref Range   Glucose-Capillary 167 (H) 70 - 99 mg/dL   Comment 1 Notify RN   Glucose, capillary     Status: Abnormal   Collection Time: 08/08/14 10:03 PM  Result Value Ref Range   Glucose-Capillary 230 (H) 70 - 99 mg/dL   Comment 1 Notify RN   Glucose, capillary     Status: Abnormal   Collection Time: 08/09/14  6:08 AM  Result Value Ref Range   Glucose-Capillary 212 (H) 70 - 99 mg/dL   Comment 1 Notify RN     Physical Findings: AIMS: Facial and Oral Movements Muscles of Facial Expression: None, normal Lips and Perioral Area: None, normal Jaw: None, normal Tongue: None, normal,Extremity Movements Upper (arms, wrists, hands, fingers): None, normal Lower (legs, knees, ankles, toes): None, normal, Trunk Movements Neck, shoulders, hips: None, normal, Overall Severity Severity of abnormal movements (highest score from questions above): None, normal Incapacitation due to abnormal movements: None, normal Patient's awareness of abnormal movements (rate only patient's report): No Awareness, Dental Status Current problems with teeth and/or dentures?: No Does patient usually wear dentures?: No  CIWA:    COWS:     Psychiatric Specialty Exam: See Psychiatric Specialty Exam and Suicide Risk Assessment completed by Attending Physician prior to discharge.  Discharge destination:  Home  Is patient on  multiple antipsychotic therapies at discharge:  No   Has Patient had three or more failed trials of antipsychotic monotherapy by history:  No  Recommended Plan for Multiple Antipsychotic Therapies: NA      Discharge Instructions    Ambulatory referral to [facility] Di[facility]    Complete by:  As directed      Discharge instructions    Complete by:  As directed   Please follow up with your Primary Care Provider for further management of chronic medical problems such as Diabetes.  Medication List    STOP taking these medications        buPROPion 150 MG 12 hr tablet  Commonly known as:  WELLBUTRIN SR  Replaced by:  buPROPion 300 MG 24 hr tablet     Efinaconazole 10 % Soln  Commonly known as:  JUBLIA     glipiZIDE 10 MG 24 hr tablet  Commonly known as:  GLUCOTROL XL     Glucosamine 500 MG Tabs     HYDROcodone-acetaminophen 5-325 MG per tablet  Commonly known as:  NORCO     lidocaine 2 % solution  Commonly known as:  XYLOCAINE     NOVOLOG MIX 70/30 FLEXPEN (70-30) 100 UNIT/ML FlexPen  Generic drug:  insulin aspart protamine - aspart      TAKE these medications      Indication   albuterol 108 (90 BASE) MCG/ACT inhaler  Commonly known as:  PROVENTIL HFA;VENTOLIN HFA  Inhale 2 puffs into the lungs every 6 (six) hours as needed for wheezing (wheezing). For shortness of breath      ARIPiprazole 10 MG tablet  Commonly known as:  ABILIFY  Take 1 tablet (10 mg total) by mouth 2 (two) times daily.   Indication:  Cocaine Dependence, Major Depressive Disorder     aspirin EC 81 MG tablet  Take 1 tablet (81 mg total) by mouth daily.   Indication:  Heart Attack     atenolol 25 MG tablet  Commonly known as:  TENORMIN  Take 1 tablet (25 mg total) by mouth daily.   Indication:  High Blood Pressure     budesonide-formoterol 80-4.5 MCG/ACT inhaler  Commonly known as:  SYMBICORT  Inhale 2 puffs into the lungs 2 (two) times daily.   Indication:  Asthma      buPROPion 300 MG 24 hr tablet  Commonly known as:  WELLBUTRIN XL  Take 1 tablet (300 mg total) by mouth daily.   Indication:  Major Depressive Disorder     dextromethorphan-guaiFENesin 30-600 MG per 12 hr tablet  Commonly known as:  MUCINEX DM  Take 1 tablet by mouth 2 (two) times daily as needed for cough (cough).      esomeprazole 40 MG capsule  Commonly known as:  NEXIUM  Take 1 capsule (40 mg total) by mouth daily.   Indication:  Heartburn     ezetimibe 10 MG tablet  Commonly known as:  ZETIA  Take 1 tablet (10 mg total) by mouth daily.   Indication:  Elevation of Both Cholesterol and Triglycerides in Blood     fluticasone 50 MCG/ACT nasal spray  Commonly known as:  FLONASE  Place 2 sprays into both nostrils daily.   Indication:  Hayfever     gabapentin 300 MG capsule  Commonly known as:  NEURONTIN  Take 1 capsule (300 mg total) by mouth 5 (five) times daily.   Indication:  Neuropathic pain     glucose monitoring kit monitoring kit  1 each by Does not apply route as needed for other. For daily blood glucose monitoring related to diabetes.   Indication:  For daily blood glucose monitoring     ibuprofen 200 MG tablet  Commonly known as:  ADVIL,MOTRIN  Take 600 mg by mouth every 8 (eight) hours as needed for mild pain or moderate pain.      insulin glargine 100 UNIT/ML injection  Commonly known as:  LANTUS  Inject 0.45 mLs (45 Units total) into the skin at bedtime.   Indication:  Type 2  Diabetes     insulin lispro 100 UNIT/ML injection  Commonly known as:  HUMALOG  Inject 0.1 mLs (10 Units total) into the skin 3 (three) times daily before meals.   Indication:  Type 2 Diabetes     loratadine 10 MG tablet  Commonly known as:  CLARITIN  Take 1 tablet (10 mg total) by mouth daily.   Indication:  Hayfever     losartan 25 MG tablet  Commonly known as:  COZAAR  Take 1 tablet (25 mg total) by mouth daily.   Indication:  High Blood Pressure     metFORMIN 1000 MG  tablet  Commonly known as:  GLUCOPHAGE  Take 1 tablet (1,000 mg total) by mouth 2 (two) times daily with a meal.   Indication:  Type 2 Diabetes     methocarbamol 500 MG tablet  Commonly known as:  ROBAXIN  Take 500 mg by mouth every 8 (eight) hours as needed for muscle spasms (muscle spasms).      multivitamin with minerals Tabs tablet  Take 1 tablet by mouth daily.   Indication:  Vitamin Supplementation     sertraline 100 MG tablet  Commonly known as:  ZOLOFT  Take 1 tablet (100 mg total) by mouth at bedtime.   Indication:  Major Depressive Disorder     zolpidem 5 MG tablet  Commonly known as:  AMBIEN  Take 1 tablet (5 mg total) by mouth at bedtime as needed for sleep.   Indication:  Trouble Sleeping       Follow-up Information    [facility]   Why:  They will pick you up at 3 PM today.     Contact information:   [address]  [place], [place] [address] Phone:(336) [phone] Fax: [phone]      Follow-up recommendations:   Activity as tolerated Diet; as per nutritionist Follow up: [facility]  Comments:   Take all your medications as prescribed by your mental healthcare provider.  Report any adverse effects and or reactions from your medicines to your outpatient provider promptly.  Patient is instructed and cautioned to not engage in alcohol and or illegal drug use while on prescription medicines.  In the event of worsening symptoms, patient is instructed to call the crisis hotline, 911 and or go to the nearest ED for appropriate evaluation and treatment of symptoms.  Follow-up with your primary care provider for your other medical issues, concerns and or health care needs.   Total Discharge Time:  Greater than 30 minutes.  SignedElmarie Shiley NP-C 08/09/2014, 2:34 PM  I personally assessed the patient and formulated the plan Geralyn Flash A. Sabra Heck, M.D.

## 2014-08-09 NOTE — [facility] Suicide Risk Assessment (Signed)
Suicide Risk Assessment  Discharge Assessment     Demographic Factors:  Male  Total Time spent with patient: 30 minutes  Psychiatric Specialty Exam:     Blood pressure 121/80, pulse 88, temperature 98.4 F (36.9 C), temperature source Oral, resp. rate 18, height 6' 0.75" (1.848 m), weight 74.39 kg (164 lb).Body mass index is 21.78 kg/(m^2).  General Appearance: Fairly Groomed  Patent attorney::  Fair  Speech:  Clear and Coherent  Volume:  Normal  Mood:  worried  Affect:  worried  Thought Process:  Coherent and Goal Directed  Orientation:  Full (Time, Place, and Person)  Thought Content:  plans as he moves on, changes he plans to make  Suicidal Thoughts:  No  Homicidal Thoughts:  No  Memory:  Immediate;   Fair Recent;   Fair Remote;   Fair  Judgement:  Fair  Insight:  Fair  Psychomotor Activity:  Normal  Concentration:  Fair  Recall:  [employer] of Knowledge:Fair  Language: Fair  Akathisia:  No  Handed:  Right  AIMS (if indicated):     Assets:  Desire for Improvement Housing Vocational/Educational  Sleep:  Number of Hours: 6.75    Musculoskeletal: Strength & Muscle Tone: within normal limits Gait & Station: affected by the nuropathy Patient leans: N/A   Mental Status Per Nursing Assessment::   On Admission:     Current Mental Status by Physician: In full contact with reality. There are no active SI plans or intent. He states he feels better. He states that he is going back home and will still be working with his family in the group homes. He feels that if he starts seeing a therapist every week, one-one he will be able to deal with them better. He also affirms his commitment to better manage his Diabetes.  He got proper instruction in terms of more effective ways of administering his insulin. He has follow up in place   Loss Factors: Decline in physical health  Historical Factors: NA  Risk Reduction Factors:   Sense of responsibility to family, Employed,  Living with another person, especially a relative and Positive social support  Continued Clinical Symptoms:  Depression:   Severe  Cognitive Features That Contribute To Risk:  Closed-mindedness Polarized thinking Thought constriction (tunnel vision)    Suicide Risk:  Minimal: No identifiable suicidal ideation.  Patients presenting with no risk factors but with morbid ruminations; may be classified as minimal risk based on the severity of the depressive symptoms  Discharge Diagnoses:   AXIS I:  Major Depression recurrent severe without psychotic symptoms AXIS II:  No diagnosis AXIS III:   Past Medical History  Diagnosis Date  . Depression   . Diabetes mellitus   . Hyperlipidemia   . HTN (hypertension)   . Chronic headaches   . Osteoarthritis   . Kidney stones   . Polysubstance abuse     cocaine, marijuana-quit summer 2010  . Tobacco abuse   . Asthma   . Chest pain   . Lumbar disc disease   . GERD (gastroesophageal reflux disease)   . OSA (obstructive sleep apnea)   . Peripheral neuropathy 02/01/2011  . Bipolar 1 disorder   . Ingrown nail 11/12/2013   AXIS IV:  other psychosocial or environmental problems AXIS V:  61-70 mild symptoms  Plan Of Care/Follow-up recommendations:  Activity as tolerated Diet; as per nutritionist Follow up: [facility]  Is patient on multiple antipsychotic therapies at discharge:  No   Has  Patient had three or more failed trials of antipsychotic monotherapy by history:  No  Recommended Plan for Multiple Antipsychotic Therapies: NA    Jaxn Chiquito A 08/09/2014, 2:36 PM

## 2014-08-09 NOTE — Progress Notes (Signed)
Occupational Therapy Treatment Patient Details Name: MIQUEAS WHILDEN MRN: [id number] DOB: 06-07-1976 Today's Date: 08/09/2014    History of present illness 39 y.o. male with h/o diabetes, neuropathy admitted to behavioral health with depression/suicidal ideation.    OT comments  Pt has made excellent progress with R strengthening in 2 days. He wil continue with HEP at home  Follow Up Recommendations  No OT follow up (if pt's strength not near normal in 2 weeks, follow up with PCP for OP referral)    Equipment Recommendations    None by OT   Recommendations for Other Services      Precautions / Restrictions Precautions Precautions: Fall       Mobility Bed Mobility                  Transfers                      Balance                                   ADL                                         General ADL Comments: pt reports he has been able to use toothbrush and utensils without built up foam:  now mod I.  Occasional help for set up      Vision                     Perception     Praxis      Cognition   Behavior During Therapy: [facility] for tasks assessed/performed Overall Cognitive Status: Within Functional Limits for tasks assessed                       Extremity/Trunk Assessment               Exercises Other Exercises Other Exercises: reassessed RUE hand strength and upgraded HEP.  Pt's grip 4-/5; lateral pinch 4/5; tip and 3 jaw chuck pinches are 4-/5.  Pt is able to abduct fingers with 3-/5 strength and adduct with 3/5 strength Other Exercises: Upgraded putty HEP to make thicker "cigar" roll and continue pinching with tip, lateral and 3 jaw chuck pinches.  Continue rolling/squeezing ball, continue abduction/adduction exercises and when R hand moves as well as L to pinch off a small piece of tan theraputty and roll into thin cylinder to squeeze between each finger.  Added Rooftop for intrinsic  muscles.  Written HEP provided and pt return demonstrated.   Shoulder Instructions       General Comments      Pertinent Vitals/ Pain       Pain Assessment: No/denies pain  Home Living                                          Prior Functioning/Environment              Frequency       Progress Toward Goals  OT Goals(current goals can now be found in the care plan section)  Progress towards OT goals: Progressing toward goals (2 goals met:  needs some set  up for meals)     Plan      Co-evaluation                 End of Session     Activity Tolerance Patient tolerated treatment well   Patient Left     Nurse Communication      Self Care Goal Status [phone]): At least 1 percent but less than 20 percent impaired, limited or restricted   Time: 1310-1332 OT Time Calculation (min): 22 min  Charges: OT G-codes **NOT FOR INPATIENT CLASS** Self Care Goal Status ([id number]): At least 1 percent but less than 20 percent impaired, limited or restricted OT General Charges $OT Visit: 1 Procedure OT Treatments $Therapeutic Exercise: 8-22 mins  Oneda Duffett 08/09/2014, 1:43 PM   Lesle Chris, OTR/L [phone] 08/09/2014

## 2014-08-09 NOTE — Progress Notes (Signed)
D: client in bed this evening c/o of "quezzy stomach" "think it might be from the Metformin, been having some diarrhea" A: Writer introduced self to client, staffed with Brett Albinoori, NP received order for Imodium. Staff will monitor q6415min for safety. R: client is safe on the unit, took Imodium, did not attend karaoke.

## 2014-08-09 NOTE — [facility] Group Notes (Signed)
[facility]   08/09/2014 11:24 AM    Participation Quality:  Appropraite  Mood/Affect:  Appropriate  Depression Rating:  0  Anxiety Rating:  0  Thoughts of Suicide:  No  Will you contract for safety?   NA  Current AVH:  No  Plan for Discharge/Comments:  Patient attended discharge planning group and actively participated in group. He reports doing well and looking forward to discharging home today.  He will follow up with [facility]>  Suicide prevention education reviewed and SPE document provided.   Transportation Means: Patient has transportation.   Supports:  Patient has a support system.   Sadik Piascik, Joesph JulyQuylle Hairston

## 2014-08-09 NOTE — Progress Notes (Signed)
[facility] Dis[facility] :  Will you be returning to the same living situation after discharge: Yes,  home At discharge, do you have transportation home?:Yes,  [facility] Do you have the ability to pay for your medications:Yes,  ACT team  Release of information consent forms completed and in the chart;  Patient's signature needed at discharge.  Patient to Follow up at: Follow-up Information    [facility] with [facility].   Why:  They will pick you up at 3 PM today.     Contact information:   [address],  [place], [place] [address] Phone:(336) [phone] Fax: [phone]      Patient denies SI/HI:   Yes,  yes    Safety Planning and Suicide Prevention discussed:  Yes,  yes  Yes, faxed on 08/09/14.  Daryel Geraldorth, Rithvik Orcutt B 08/09/2014, 1:02 PM

## 2014-08-09 NOTE — Plan of Care (Signed)
Problem: Diagnosis: Increased Risk For Suicide Attempt Goal: LTG-Patient Will Report Improved Mood and Deny Suicidal LTG (by discharge) Patient will report improved mood and deny suicidal ideation.  Outcome: Progressing Client denies suicidal ideations, "feel better" "ready go home"

## 2014-08-09 NOTE — Tx Team (Signed)
Interdisciplinary Treatment Plan Update   Date Reviewed: 08/08/2014  Time Reviewed: 4:30 PM  Progress in Treatment:  Attending groups: Yes Participating in groups: Yes Taking medication as prescribed: Yes  Tolerating medication: Yes Family/Significant other contact made: Yes  Patient understands diagnosis: Yes  Discussing patient identified problems/goals with staff: Yes See initial care plan Medical problems stabilized or resolved: Yes Denies suicidal/homicidal ideation: Yes In tx team Patient has not harmed self or others: Yes  For review of initial/current patient goals, please see plan of care.  Estimated Length of Stay: Likely d/c tomorrow  Reason for Continuation of Hospitalization:  Discharging today w [facility] ACT Team at 3 PM   New Problems/Goals identified: N/A  Discharge Plan or Barriers: return home, follow up with ACT team  Additional Comments:  08/09/14:  Patient plans to discharge today, will return home w [facility] Team support.  Medications include ARIPprazole tablet 10 mg, buPROpion24 hr tablet 300 mg, sertraline tablet 100 mg. Patient scheduled to meet w Diabetes Coordinator and [facility] additional diabetic teaching re new diagnosis of insulin dependent diabetes.  ACT Team will monitor medications, provide support, and will continue to send RN in for diabetes education and support.    Attendees:  Signature: I WrightsvilleLugo, MDF Cobos MD 08/09/2014 9:30 AM   Signature: Santa Generanne Teofilo Lupinacci, LCSW 08/09/2014 9:30 AM   Signature: Juline PatchQuylle Hodnett, MD 08/09/2014 9:30 AM   Signature: Sarah,Marion,Patty, RN 08/09/2014 9:30 AM   Signature: A Nwoko NP 08/09/2014 9:30 AM   Signature: Laban EmperorL Davis NP 08/09/2014 9:30 AM   Signature: Tedra CoupeV Enoch, [facility] 08/09/2014 9:30 AM  Signature: Marella ChimesD Sutton, [facility] 08/09/14 9:30 AM  Signature:    Signature:    Signature:    Signature:    Signature:      Scribe for Treatment Team:  Santa GeneraAnne  Cheris Tweten, LCSW 08/09/2014 9:30 AM

## 2014-08-09 NOTE — Progress Notes (Addendum)
  Results for [name], [name] (MRN [id number]) as of 08/09/2014 12:09  Ref. Range 08/08/2014 06:18 08/08/2014 11:52 08/08/2014 16:59 08/08/2014 22:03 08/09/2014 06:08  Glucose-Capillary Latest Range: 70-99 mg/dL 409213 (H) 811266 (H) 914167 (H) 230 (H) 212 (H)  Inpatient Diabetes Program Recommendations  AACE/ADA: New Consensus Statement on Inpatient Glycemic Control (2013)  Target Ranges:  Prepandial:   less than 140 mg/dL      Peak postprandial:   less than 180 mg/dL (1-2 hours)      Critically ill patients:  140 - 180 mg/dL   Needs insulin titration with CBGs in 200s.  Inpatient Diabetes Program Recommendations Insulin - Basal: Increase Lantus to 48 units QHS Insulin - Meal Coverage: Increase Novolog to 12 units tidwc  Note: Will continue to follow. Thank you. Ailene Ardshonda Aliceson Dolbow, RD, LDN, CDE Inpatient Diabetes Coordinator [phone]  Addendum: Social Worker paged for OP Diabetes Education consult orders. [facility] will call pt to set up appt.  Thank you. Ailene Ardshonda Kendall Arnell, RD, LDN, CDE

## 2014-08-09 NOTE — Clinical Social Work Note (Signed)
[facility] diabetic teaching and support, [facility] Team also [facility]  Diabetes Coordinator notified and agreeable.  Santa GeneraAnne Dakai Braithwaite, LCSW Clinical Social Worker

## 2014-08-09 NOTE — Progress Notes (Signed)
Patient ID: Wayne [name], male   DOB: 03/06/1976, 39 y.o.   MRN: [id number]  Pt currently presents with a blunted affect and depressed behavior. Per self inventory, pt rates depression at a 0, hopelessness 0 and anxiety 0. Pt's daily goal is "going home" and they intend to do so by "make sure I have meds." Pt reports fair sleep, good concentration and appetite.   Pt provided with medications per providers orders. Pt's labs and vitals were monitored throughout the day. Pt supported emotionally and encouraged to express concerns and questions. Pt educated on medication and hypoglycemic management. Pt given pain medications for generalized pain.   Pt's safety ensured with 15 minute and environmental checks. Pt currently denies SI/HI and A/V hallucinations. Pt verbally agrees to seek staff if SI/HI or A/VH occurs and to consult with staff before acting on these thoughts. Pt to be discharged today.

## 2014-08-09 NOTE — Progress Notes (Signed)
Patient ID: Wayne [name], male   DOB: 03/25/1976, 39 y.o.   MRN: [id number]  [facility]. Pt being discharged today. MD notified.

## 2014-08-12 LAB — GLUCOSE, CAPILLARY: GLUCOSE-CAPILLARY: 282 mg/dL — AB (ref 70–99)

## 2014-08-14 NOTE — Progress Notes (Signed)
Patient Discharge Instructions:  After Visit Summary (AVS):   Faxed to:  08/14/14 Discharge Summary Note:   Faxed to:  08/14/14 Psychiatric Admission Assessment Note:   Faxed to:  08/14/14 Suicide Risk Assessment - Discharge Assessment:   Faxed to:  08/14/14 Faxed/Sent to the Next Level Care provider:  08/14/14 Faxed to [facility] @ [phone]  Wayne ReddenSheena E [place], 08/14/2014, 2:31 PM

## 2014-08-21 ENCOUNTER — Ambulatory Visit: Payer: Self-pay | Admitting: *Deleted

## 2014-08-27 ENCOUNTER — Ambulatory Visit: Payer: BC Managed Care – PPO | Admitting: Podiatry

## 2014-08-30 ENCOUNTER — Ambulatory Visit: Payer: Self-pay | Admitting: Podiatry

## 2014-09-04 ENCOUNTER — Ambulatory Visit: Payer: Self-pay | Admitting: Podiatry

## 2014-09-18 ENCOUNTER — Ambulatory Visit: Payer: Self-pay | Admitting: *Deleted

## 2014-09-27 ENCOUNTER — Ambulatory Visit: Payer: Self-pay | Admitting: Podiatry

## 2014-10-11 ENCOUNTER — Emergency Department (HOSPITAL_BASED_OUTPATIENT_CLINIC_OR_DEPARTMENT_OTHER)
Admission: EM | Admit: 2014-10-11 | Discharge: 2014-10-11 | Disposition: A | Discharge status: Home / Self Care | Payer: BLUE CROSS/BLUE SHIELD | Attending: Emergency Medicine | Admitting: Emergency Medicine

## 2014-10-11 ENCOUNTER — Other Ambulatory Visit: Payer: Self-pay

## 2014-10-11 ENCOUNTER — Emergency Department (HOSPITAL_BASED_OUTPATIENT_CLINIC_OR_DEPARTMENT_OTHER): Payer: BLUE CROSS/BLUE SHIELD

## 2014-10-11 ENCOUNTER — Encounter (HOSPITAL_BASED_OUTPATIENT_CLINIC_OR_DEPARTMENT_OTHER): Payer: Self-pay

## 2014-10-11 DIAGNOSIS — E785 Hyperlipidemia, unspecified: Secondary | ICD-10-CM | POA: Diagnosis not present

## 2014-10-11 DIAGNOSIS — Z79899 Other long term (current) drug therapy: Secondary | ICD-10-CM | POA: Insufficient documentation

## 2014-10-11 DIAGNOSIS — R739 Hyperglycemia, unspecified: Secondary | ICD-10-CM

## 2014-10-11 DIAGNOSIS — F3131 Bipolar disorder, current episode depressed, mild: Secondary | ICD-10-CM | POA: Diagnosis not present

## 2014-10-11 DIAGNOSIS — J45909 Unspecified asthma, uncomplicated: Secondary | ICD-10-CM | POA: Diagnosis not present

## 2014-10-11 DIAGNOSIS — R0602 Shortness of breath: Secondary | ICD-10-CM | POA: Insufficient documentation

## 2014-10-11 DIAGNOSIS — Z87891 Personal history of nicotine dependence: Secondary | ICD-10-CM | POA: Insufficient documentation

## 2014-10-11 DIAGNOSIS — Z7951 Long term (current) use of inhaled steroids: Secondary | ICD-10-CM | POA: Diagnosis not present

## 2014-10-11 DIAGNOSIS — M1990 Unspecified osteoarthritis, unspecified site: Secondary | ICD-10-CM | POA: Insufficient documentation

## 2014-10-11 DIAGNOSIS — Z872 Personal history of diseases of the skin and subcutaneous tissue: Secondary | ICD-10-CM | POA: Insufficient documentation

## 2014-10-11 DIAGNOSIS — Z7982 Long term (current) use of aspirin: Secondary | ICD-10-CM | POA: Diagnosis not present

## 2014-10-11 DIAGNOSIS — Z794 Long term (current) use of insulin: Secondary | ICD-10-CM | POA: Diagnosis not present

## 2014-10-11 DIAGNOSIS — R300 Dysuria: Secondary | ICD-10-CM | POA: Diagnosis not present

## 2014-10-11 DIAGNOSIS — I10 Essential (primary) hypertension: Secondary | ICD-10-CM | POA: Diagnosis not present

## 2014-10-11 DIAGNOSIS — K219 Gastro-esophageal reflux disease without esophagitis: Secondary | ICD-10-CM | POA: Insufficient documentation

## 2014-10-11 DIAGNOSIS — R000 Tachycardia, unspecified: Secondary | ICD-10-CM | POA: Insufficient documentation

## 2014-10-11 DIAGNOSIS — G8929 Other chronic pain: Secondary | ICD-10-CM | POA: Insufficient documentation

## 2014-10-11 DIAGNOSIS — E1165 Type 2 diabetes mellitus with hyperglycemia: Secondary | ICD-10-CM | POA: Insufficient documentation

## 2014-10-11 DIAGNOSIS — Z87442 Personal history of urinary calculi: Secondary | ICD-10-CM | POA: Insufficient documentation

## 2014-10-11 LAB — URINE MICROSCOPIC-ADD ON

## 2014-10-11 LAB — CBG MONITORING, ED
GLUCOSE-CAPILLARY: 317 mg/dL — AB (ref 70–99)
Glucose-Capillary: 568 mg/dL (ref 70–99)
Glucose-Capillary: 173 mg/dL — ABNORMAL HIGH (ref 70–99)

## 2014-10-11 LAB — BASIC METABOLIC PANEL
ANION GAP: 9 (ref 5–15)
BUN: 20 mg/dL (ref 6–23)
CHLORIDE: 98 mmol/L (ref 96–112)
CO2: 29 mmol/L (ref 19–32)
Calcium: 10.3 mg/dL (ref 8.4–10.5)
Creatinine, Ser: 1.25 mg/dL (ref 0.50–1.35)
GFR calc Af Amer: 83 mL/min — ABNORMAL LOW (ref >90)
GFR, EST NON AFRICAN AMERICAN: 72 mL/min — AB (ref >90)
Glucose, Bld: 462 mg/dL — ABNORMAL HIGH (ref 70–99)
Potassium: 4.3 mmol/L (ref 3.5–5.1)
Sodium: 136 mmol/L (ref 135–145)

## 2014-10-11 LAB — I-STAT CG4 LACTIC ACID, ED
LACTIC ACID, VENOUS: 1.55 mmol/L (ref 0.5–2.0)
Lactic Acid, Venous: 2.14 mmol/L (ref 0.5–2.0)

## 2014-10-11 LAB — CBC WITH DIFFERENTIAL/PLATELET
BASOS ABS: 0 10*3/uL (ref 0.0–0.1)
Basophils Relative: 1 % (ref 0–1)
Eosinophils Absolute: 0.1 10*3/uL (ref 0.0–0.7)
Eosinophils Relative: 1 % (ref 0–5)
HCT: 42.6 % (ref 39.0–52.0)
HEMOGLOBIN: 15.1 g/dL (ref 13.0–17.0)
LYMPHS PCT: 28 % (ref 12–46)
Lymphs Abs: 1.4 10*3/uL (ref 0.7–4.0)
MCH: 31.1 pg (ref 26.0–34.0)
MCHC: 35.4 g/dL (ref 30.0–36.0)
MCV: 87.8 fL (ref 78.0–100.0)
MONOS PCT: 14 % — AB (ref 3–12)
Monocytes Absolute: 0.7 10*3/uL (ref 0.1–1.0)
NEUTROS ABS: 2.8 10*3/uL (ref 1.7–7.7)
Neutrophils Relative %: 56 % (ref 43–77)
Platelets: 180 10*3/uL (ref 150–400)
RBC: 4.85 MIL/uL (ref 4.22–5.81)
RDW: 12.6 % (ref 11.5–15.5)
WBC: 4.9 10*3/uL (ref 4.0–10.5)

## 2014-10-11 LAB — URINALYSIS, ROUTINE W REFLEX MICROSCOPIC
Bilirubin Urine: NEGATIVE
Glucose, UA: >1000 mg/dL — AB
Hgb urine dipstick: NEGATIVE
Ketones, ur: NEGATIVE mg/dL
Leukocytes, UA: NEGATIVE
NITRITE: NEGATIVE
PROTEIN: NEGATIVE mg/dL
Specific Gravity, Urine: 1.038 — ABNORMAL HIGH (ref 1.005–1.030)
UROBILINOGEN UA: 0.2 mg/dL (ref 0.0–1.0)
pH: 5 (ref 5.0–8.0)

## 2014-10-11 MED ORDER — SODIUM CHLORIDE 0.9 % IV BOLUS (SEPSIS)
1000.0000 mL | Freq: Once | INTRAVENOUS | Status: AC
  Administered 2014-10-11: 1000 mL via INTRAVENOUS

## 2014-10-11 MED ORDER — DEXTROSE-NACL 5-0.45 % IV SOLN: INTRAVENOUS | Status: DC

## 2014-10-11 MED ORDER — SODIUM CHLORIDE 0.9 % IV SOLN
1000.0000 mL | INTRAVENOUS | Status: DC
  Administered 2014-10-11: 1000 mL via INTRAVENOUS

## 2014-10-11 MED ORDER — SODIUM CHLORIDE 0.9 % IV SOLN
1000.0000 mL | Freq: Once | INTRAVENOUS | Status: AC
  Administered 2014-10-11: 1000 mL via INTRAVENOUS

## 2014-10-11 MED ORDER — INSULIN REGULAR HUMAN 100 UNIT/ML IJ SOLN: INTRAMUSCULAR | Status: DC

## 2014-10-11 NOTE — ED Notes (Signed)
Here for evaluation of blood sugar (588).

## 2014-10-11 NOTE — ED Notes (Signed)
Patient transported to X-ray and returned 

## 2014-10-11 NOTE — ED Provider Notes (Signed)
CSN: [id number]     Arrival date & time 10/11/14  33 History   First MD Initiated Contact with Patient 10/11/14 1623     Chief Complaint  Patient presents with  . Hyperglycemia     (Consider location/radiation/quality/duration/timing/severity/associated sxs/prior Treatment) Patient is a 39 y.o. male presenting with hyperglycemia.  Hyperglycemia    This is a 39 year old male with a past medical history of insulin-dependent diabetes, psychiatric disorder, history of polysubstance abuse and noncompliance with medications. Patient presents emergency Department with chief complaint of hyperglycemia. Patient states that his blood sugar this morning at home was in the 200s. Upon arrival, his blood sugar here was nearly 600. He complains of severe polyuria, polydipsia, and excessive thirst. He is feeling lightheaded and a bit short of breath. He has a history of a previous episode of DKA. He has some dysuria. Denies any other symptoms of infection.  Past Medical History  Diagnosis Date  . Depression   . Diabetes mellitus   . Hyperlipidemia   . HTN (hypertension)   . Chronic headaches   . Osteoarthritis   . Kidney stones   . Polysubstance abuse     cocaine, marijuana-quit summer 2010  . Tobacco abuse   . Asthma   . Chest pain   . Lumbar disc disease   . GERD (gastroesophageal reflux disease)   . OSA (obstructive sleep apnea)   . Peripheral neuropathy 02/01/2011  . Bipolar 1 disorder   . Ingrown nail 11/12/2013   Past Surgical History  Procedure Laterality Date  . Knee surgery    . Bunionectomy  06-2009   Family History  Problem Relation Age of Onset  . Heart disease Mother   . Diabetes Father   . Hyperlipidemia Father   . Emphysema Maternal Uncle   . Asthma Sister   . Asthma Mother   . Stroke Mother   . Hypertension Father   . Alzheimer's disease Maternal Grandmother   . Depression Father   . Breast cancer Maternal Aunt   . COPD Mother     maternal un   History   Substance Use Topics  . Smoking status: Former Smoker -- 0.20 packs/day for 17 years    Types: Cigarettes    Quit date: 02/24/2011  . Smokeless tobacco: Former Systems developer  . Alcohol Use: 0.0 oz/week    0 Standard drinks or equivalent per week     Comment: socially    Review of Systems   Ten systems reviewed and are negative for acute change, except as noted in the HPI.   Allergies  Iodine; Lipitor; Nystatin; and Statins  Home Medications   Prior to Admission medications   Medication Sig Start Date End Date Taking? Authorizing Provider  albuterol (PROVENTIL HFA;VENTOLIN HFA) 108 (90 BASE) MCG/ACT inhaler Inhale 2 puffs into the lungs every 6 (six) hours as needed for wheezing (wheezing). For shortness of breath   Yes Historical Provider, MD  ARIPiprazole (ABILIFY) 10 MG tablet Take 1 tablet (10 mg total) by mouth 2 (two) times daily. 08/09/14  Yes Niel Hummer, NP  atenolol (TENORMIN) 25 MG tablet Take 1 tablet (25 mg total) by mouth daily. 08/09/14  Yes Niel Hummer, NP  budesonide-formoterol (SYMBICORT) 80-4.5 MCG/ACT inhaler Inhale 2 puffs into the lungs 2 (two) times daily. 08/09/14  Yes Niel Hummer, NP  buPROPion (WELLBUTRIN XL) 300 MG 24 hr tablet Take 1 tablet (300 mg total) by mouth daily. 08/09/14  Yes Niel Hummer, NP  dextromethorphan-guaiFENesin [place] DM)  30-600 MG per 12 hr tablet Take 1 tablet by mouth 2 (two) times daily as needed for cough (cough).   Yes Historical Provider, MD  esomeprazole (NEXIUM) 40 MG capsule Take 1 capsule (40 mg total) by mouth daily. 08/09/14  Yes Niel Hummer, NP  ezetimibe (ZETIA) 10 MG tablet Take 1 tablet (10 mg total) by mouth daily. 08/09/14  Yes Niel Hummer, NP  fluticasone (FLONASE) 50 MCG/ACT nasal spray Place 2 sprays into both nostrils daily. 08/09/14  Yes Niel Hummer, NP  gabapentin (NEURONTIN) 300 MG capsule Take 1 capsule (300 mg total) by mouth 5 (five) times daily. 08/09/14  Yes Niel Hummer, NP  glucose monitoring kit  (FREESTYLE) monitoring kit 1 each by Does not apply route as needed for other. For daily blood glucose monitoring related to diabetes. 08/09/14  Yes Niel Hummer, NP  ibuprofen (ADVIL,MOTRIN) 200 MG tablet Take 600 mg by mouth every 8 (eight) hours as needed for mild pain or moderate pain.    Yes Historical Provider, MD  insulin aspart protamine - aspart (NOVOLOG 70/30 MIX) (70-30) 100 UNIT/ML FlexPen Inject 30 Units into the skin daily with breakfast.   Yes Historical Provider, MD  insulin glargine (LANTUS) 100 UNIT/ML injection Inject 0.45 mLs (45 Units total) into the skin at bedtime. 08/09/14 10/28/15 Yes Niel Hummer, NP  loratadine (CLARITIN) 10 MG tablet Take 1 tablet (10 mg total) by mouth daily. 08/09/14  Yes Niel Hummer, NP  losartan (COZAAR) 25 MG tablet Take 1 tablet (25 mg total) by mouth daily. 08/09/14  Yes Niel Hummer, NP  metFORMIN (GLUCOPHAGE) 1000 MG tablet Take 1 tablet (1,000 mg total) by mouth 2 (two) times daily with a meal. 08/09/14  Yes Niel Hummer, NP  methocarbamol (ROBAXIN) 500 MG tablet Take 500 mg by mouth every 8 (eight) hours as needed for muscle spasms (muscle spasms).  12/10/13  Yes Historical Provider, MD  Multiple Vitamin (MULTIVITAMIN WITH MINERALS) TABS tablet Take 1 tablet by mouth daily. 08/09/14  Yes Niel Hummer, NP  sertraline (ZOLOFT) 100 MG tablet Take 1 tablet (100 mg total) by mouth at bedtime. 08/09/14  Yes Niel Hummer, NP  zolpidem (AMBIEN) 5 MG tablet Take 1 tablet (5 mg total) by mouth at bedtime as needed for sleep. 08/09/14  Yes Niel Hummer, NP  aspirin EC 81 MG tablet Take 1 tablet (81 mg total) by mouth daily. 08/09/14   Niel Hummer, NP  insulin lispro (HUMALOG) 100 UNIT/ML injection Inject 0.1 mLs (10 Units total) into the skin 3 (three) times daily before meals. 08/09/14   Niel Hummer, NP   BP 102/67 mmHg  Pulse 94  Temp(Src) 97.5 F (36.4 C) (Oral)  Resp 22  Ht 6' (1.829 m)  Wt 164 lb (74.39 kg)  BMI 22.24 kg/m2  SpO2 99% Physical  Exam  Constitutional: He is oriented to person, place, and time. He appears well-developed and well-nourished. No distress.  HENT:  Head: Normocephalic and atraumatic.   Dry oral mucosa  Eyes: Conjunctivae are normal. No scleral icterus.  Neck: Normal range of motion. Neck supple.  Cardiovascular: Normal rate, regular rhythm and normal heart sounds.   Pulmonary/Chest: Effort normal and breath sounds normal. No respiratory distress.  Abdominal: Soft. There is no tenderness.  Musculoskeletal: He exhibits no edema.  Neurological: He is alert and oriented to person, place, and time.  Skin: Skin is warm and dry. He is not diaphoretic.  Psychiatric:  His behavior is normal.  Nursing note and vitals reviewed.   ED Course  Procedures (including critical care time) Labs Review Labs Reviewed  BASIC METABOLIC PANEL - Abnormal; Notable for the following:    Glucose, Bld 462 (*)    GFR calc non Af Amer 72 (*)    GFR calc Af Amer 83 (*)    All other components within normal limits  CBC WITH DIFFERENTIAL/PLATELET - Abnormal; Notable for the following:    Monocytes Relative 14 (*)    All other components within normal limits  URINALYSIS, ROUTINE W REFLEX MICROSCOPIC - Abnormal; Notable for the following:    Specific Gravity, Urine 1.038 (*)    Glucose, UA >1000 (*)    All other components within normal limits  CBG MONITORING, ED - Abnormal; Notable for the following:    Glucose-Capillary 568 (*)    All other components within normal limits  I-STAT CG4 LACTIC ACID, ED - Abnormal; Notable for the following:    Lactic Acid, Venous 2.14 (*)    All other components within normal limits  CBG MONITORING, ED - Abnormal; Notable for the following:    Glucose-Capillary 317 (*)    All other components within normal limits  CBG MONITORING, ED - Abnormal; Notable for the following:    Glucose-Capillary 173 (*)    All other components within normal limits  URINE MICROSCOPIC-ADD ON  I-STAT CG4 LACTIC  ACID, ED    Imaging Review Dg Chest 2 View  10/11/2014   CLINICAL DATA:  Hyperglycemia.  EXAM: CHEST - 2 VIEW  COMPARISON:  Two-view chest x-ray 11/18/2013  FINDINGS: The heart size is normal. The lungs are clear. The visualized soft tissues and bony thorax are unremarkable.  IMPRESSION: Negative two view chest   Electronically Signed   By: San Morelle M.D.   On: 10/11/2014 17:23     EKG Interpretation   Date/Time:  Friday October 11 2014 17:53:14 EDT Ventricular Rate:  94 PR Interval:  178 QRS Duration: 104 QT Interval:  354 QTC Calculation: 442 R Axis:   75 Text Interpretation:  ED PHYSICIAN INTERPRETATION AVAILABLE IN [facility] Normal sinus rhythm Acute pericarditis Nonspecific T wave  abnormality Abnormal ECG Confirmed by TEST, Record ([id number]) on 10/13/2014  8:53:01 AM      MDM   Final diagnoses:  Hyperglycemia    BP 102/67 mmHg  Pulse 94  Temp(Src) 97.5 F (36.4 C) (Oral)  Resp 22  Ht 6' (1.829 m)  Wt 164 lb (74.39 kg)  BMI 22.24 kg/m2  SpO2 99%  Patient tachycardic and hypotensive. I placed orders for fluid resuscitation. Initial lactate at 2.14, BMP at 462, glucose is trending down on CBG from 568 08/29/2015. After 2 L of fluid. Negative chest x-ray, awaiting a urinalysis from the patient. Patient does not have an anion gap and I doubt DKA.    Patient labs without signs of DKA.  Patient urine without Ketones.  Lactate has normalized. He is tolerating PO fluids and ambulatory. Appears safe for discharge to utilize home meds.   Margarita Mail, PA-C 10/13/14 1054  Pattricia Boss, MD 10/13/14 [phone]

## 2014-10-11 NOTE — Discharge Instructions (Signed)

## 2014-10-16 ENCOUNTER — Encounter: Payer: BLUE CROSS/BLUE SHIELD | Attending: Family Medicine | Admitting: *Deleted

## 2014-10-16 ENCOUNTER — Encounter: Payer: Self-pay | Admitting: *Deleted

## 2014-10-16 VITALS — Ht 71.0 in | Wt 163.9 lb

## 2014-10-16 DIAGNOSIS — Z794 Long term (current) use of insulin: Secondary | ICD-10-CM | POA: Insufficient documentation

## 2014-10-16 DIAGNOSIS — E119 Type 2 diabetes mellitus without complications: Secondary | ICD-10-CM | POA: Insufficient documentation

## 2014-10-16 DIAGNOSIS — Z713 Dietary counseling and surveillance: Secondary | ICD-10-CM | POA: Diagnosis not present

## 2014-10-16 NOTE — Patient Instructions (Addendum)
Take Lantus Insulin 45u at bedtime Take Novolog Insulin 10 units before each meal Use left hand to inject insulin since that is the strong hand. Use the BD Microtainer Contact-Activated Lancet for ease of obtaining blood specimen  Test first thing in the morning and before dinner every day and put number in book  ALWAYS HAVE PROTEIN WITH YOUR CARBOHYDRATES

## 2014-10-17 NOTE — Progress Notes (Signed)
Diabetes Self-Management Education  Visit Type:  DSME  Appt. Start Time: 1430 Appt. End Time: 1600  10/17/2014  Mr. Jamas Mignone, identified by name and date of birth, is a 39 y.o. male with a diagnosis of Diabetes: Type 2.  Other people present during visit:  Patient. Dorsey has not been testing his glucose nor taking his insulin. He has challenges with his hands making it difficult to manage the equipment. I provided some BD Microtainer Contact-Activated Lancets that he is able to manage. We also practiced injecting his insulin using his left hand which is stronger than his right. He medication profile displays three different insulins. Patient is not sure which is supposed to be taking. My interpretation was that he would take the Lantus in the evening and the humalog with each meal to begin. I tested his glucose during our visit presenting a 2hpp of /dl. He would go home and begin taking his Lantus this evening. I will meet with him again in 6 days to evaluate his glucose reading and ability to manage his insulin.  ASSESSMENT  Height  (1.803 m), weight 163 lb 14.4 oz (74.345 kg). Body mass index is 22.87 kg/(m^2).  Initial Visit Information:  Are you taking your medications as prescribed?: Yes Are you checking your feet?: No How often do you need to have someone help you when you read instructions, pamphlets, or other written materials from your doctor or pharmacy?: 2 - Rarely   Psychosocial:   Patient Belief/Attitude about Diabetes: Motivated to manage diabetes Self-care barriers: Debilitated state due to current medical condition, Unsteady gait/risk for falls, Lack of transportation Self-management support: Doctor's office, Family, CDE visits Other persons present: Patient Patient Concerns: Nutrition/Meal planning, Medication, Glycemic Control Special Needs: Simplified materials Preferred Learning Style: No preference indicated Learning Readiness: Ready  Complications:    Last HgB A1C per patient/outside source: 14 mg/dL How often do you check your blood sugar?: 0 times/day (not testing) (patient not testing due to poor function of fingers) Postprandial Blood glucose range (mg/dL): >782 Number of hypoglycemic episodes per month:  (constant)  Diet Intake:  Breakfast: grits, sausage Lunch: rice, spinach, beef Dinner: hamburger, pasta, spaghetti sauce Snack (evening): fruit Beverage(s): water, ice tea splenda, diet coke, OJ  Exercise:  Exercise: ADL's  Individualized Plan for Diabetes Self-Management Training:   Learning Objective:  Patient will have a greater understanding of diabetes self-management. Patient education plan per assessed needs and concerns is to attend individual sessions     Education Topics Reviewed with Patient Today:  Definition of diabetes, type 1 and 2, and the diagnosis of diabetes, Factors that contribute to the development of diabetes Role of diet in the treatment of diabetes and the relationship between the three main macronutrients and blood glucose level, Food label reading, portion sizes and measuring food., Carbohydrate counting, Information on hints to eating out and maintain blood glucose control., Meal options for control of blood glucose level and chronic complications. Role of exercise on diabetes management, blood pressure control and cardiac health. Taught/evaluated SMBG meter., Daily foot exams, Yearly dilated eye exam (Evaluated challenges with SMBG. patient unable to use present lancing device to due dysfunction of hands) Relationship between chronic complications and blood glucose control, Assessed and discussed foot care and prevention of foot problems, Dental care Role of stress on diabetes Lifestyle issues that need to be addressed for better diabetes care  PATIENTS GOALS/Plan (Developed by the patient):  Nutrition: General guidelines for healthy choices and portions discussed Medications: take  my  medication as prescribed (start taking insulin as prescribed) Monitoring : test my blood glucose as discussed (note x per day with comment) (2X daily) Reducing Risk: do foot checks daily   Patient Instructions  Take Lantus Insulin 45u at bedtime Take Novolog Insulin 10 units before each meal Use left hand to inject insulin since that is the strong hand. Use the BD Microtainer Contact-Activated Lancet for ease of obtaining blood specimen  Test first thing in the morning and before dinner every day and put number in book  ALWAYS HAVE PROTEIN WITH YOUR CARBOHYDRATES  Expected Outcomes:  Demonstrated interest in learning. Expect positive outcomes  Education material provided: Living Well with Diabetes, A1C conversion sheet, Meal plan card, My Plate, Snack sheet and Support group flyer  If problems or questions, patient to contact team via:  Phone  Future DSME appointment: Other (comment) (1 wk to evaluate glucose readings and insulin)

## 2014-10-22 ENCOUNTER — Ambulatory Visit: Payer: Self-pay | Admitting: *Deleted

## 2014-11-12 ENCOUNTER — Encounter: Payer: BLUE CROSS/BLUE SHIELD | Attending: Family Medicine | Admitting: *Deleted

## 2014-11-12 DIAGNOSIS — Z794 Long term (current) use of insulin: Secondary | ICD-10-CM | POA: Diagnosis not present

## 2014-11-12 DIAGNOSIS — Z713 Dietary counseling and surveillance: Secondary | ICD-10-CM | POA: Diagnosis not present

## 2014-11-12 DIAGNOSIS — E119 Type 2 diabetes mellitus without complications: Secondary | ICD-10-CM | POA: Diagnosis not present

## 2014-11-12 NOTE — Patient Instructions (Addendum)
Plan: Start measuring your food with measuring cups Cooked Oatmeal 1 1/2 C cooked with butter & splenda 1/4 pounder with cheese, ice tea total splenda [employer] or [employer] reduced calorie BreaTerrill Mohrd 45/50 calories that is also reduced carbs 9-10 per slice Have the whole fruit not sauce or juice Pinto Beans 1 1/2 C Spaghetti 1 1/2 C & 2X as much salad  Chobani Low Fat Greek Yogurt w/ chocolate protein powder, peanut butter & 1 banana  Continue eating well at work...[name].[name].[name]. When you get home it is not TREAT TIME!  Get started on your meds today

## 2014-11-12 NOTE — Progress Notes (Signed)
Diabetes Self-Management Education  Visit Type:   DSMe follow-up  Appt. Start Time: 1400 Appt. End Time: 1500  11/12/2014  Mr. Wayne [name], identified by name and date of birth, is a 39 y.o. male with a diagnosis of Diabetes: Type 2.  Other people present during visit:  Patient Since our last visit Wayne [name] has continued to test his glucose occasionally. He has refused to take his insulin at all. He has met with Mr. Wayne [name] and new medication will be started today. We reviewed his eating behavior and I made some significant recommendations as to portion control. He will go to Dr. Virgilio [name] office after his visit here to pick up a discount card for the new medication.  I expressed my profound concern for Wayne [name]'s wellbeing. I explained the damage being done as a result of the elevated glucose readings. He is looking forward to right hand surgery in increase movement to be performed by Dr. Burney [name]. [name] understands that this surgery can not be performed while his glucose is uncontrolled. ASSESSMENT  There were no vitals taken for this visit. There is no weight on file to calculate BMI.   Subsequent Visit Information:  Since your last visit, have you continued or began the use of a meal plan?: No Since your last visit, have you continued or began to exercise on a consistent basis?: Yes How many days per week are you exercising or participating in a physicial activity for more than 20 minutes?: 7 Since your last visit have you continued or begun to take your medications as prescribed?: No Since your last visit, are you checking your blood glucose at least once a day?: Yes  Psychosocial:   Self-care barriers: Debilitated state due to current medical condition, Lack of transportation Self-management support: [place] office, Family, CDE visits Other persons present: Patient Patient Concerns: Nutrition/Meal planning, Monitoring, Medication Special Needs: None Preferred Learning Style: No  preference indicated Learning Readiness: Ready  Complications:   Fasting Blood glucose range (mg/dL): >200 Postprandial Blood glucose range (mg/dL): >200  Diet Intake:  Breakfast: oatmeal 2C, butter, splenda  water Snack (morning): none Lunch: Quarter pounder w/cheese tea 1/2 & 1/2 Snack (afternoon): none Dinner: Prime rib, carrotts, onions, potatoes 1 serving , ice tea splenda Snack (evening): none Beverage(s): diet soda, ice tea 1/2 & 1/2, water,   Exercise:  Exercise: Light (walking / raking leaves) Light Exercise amount of time (min / week): 140   Individualized Plan for Diabetes Self-Management Training:   Learning Objective:  Patient will have a greater understanding of diabetes self-management. Patient education plan per assessed needs and concerns is to attend individual sessions     Education Topics Reviewed with Patient Today:  Purpose and frequency of SMBG. Relationship between chronic complications and blood glucose control, Identified and discussed with patient  current chronic complications Worked with patient to identify barriers to care and solutions  PATIENTS GOALS/Plan (Developed by the patient):  Nutrition: General guidelines for healthy choices and portions discussed Physical Activity: 15 minutes per day, Exercise 5-7 days per week Monitoring : test my blood glucose as discussed (note x per day with comment) (at least fasting each day)  Patient Self Evaluation of Goals - Patient rates self as meeting previously set goals:   Nutrition: < 25% Physical Activity: 50 - 75 % Medications: < 25% Monitoring: 50 - 75 %  Patient Instructions  Plan: Start measuring your food with measuring cups Cooked Oatmeal 1 1/2 C cooked with butter & splenda 1/4 pounder with cheese,  ice tea total splenda Wayne [name] or nature's Own reduced calorie Bread 45/50 calories that is also reduced carbs 9-67 per slice Have the whole fruit not sauce or juice Pinto Beans 1 1/2  C Spaghetti 1 1/2 C & 2X as much salad  Chobani Low Fat Greek Yogurt w/ chocolate protein powder, peanut butter & 1 banana  Continue eating well at work...Marland KitchenMarland KitchenMarland Kitchen When you get home it is not TREAT TIME!  Get started on your meds today  Expected Outcomes:  Demonstrated interest in learning. Expect positive outcomes  Education material provided: Meal plan card  If problems or questions, patient to contact team via:  Phone  Future DSME appointment: - 4-6 wks

## 2014-12-11 ENCOUNTER — Encounter: Payer: BLUE CROSS/BLUE SHIELD | Attending: Family Medicine | Admitting: *Deleted

## 2014-12-11 DIAGNOSIS — E119 Type 2 diabetes mellitus without complications: Secondary | ICD-10-CM | POA: Diagnosis present

## 2014-12-11 DIAGNOSIS — Z794 Long term (current) use of insulin: Secondary | ICD-10-CM | POA: Insufficient documentation

## 2014-12-11 DIAGNOSIS — Z713 Dietary counseling and surveillance: Secondary | ICD-10-CM | POA: Insufficient documentation

## 2014-12-11 NOTE — Progress Notes (Signed)
Patient returns for f/u for T2DM. He has a 14day avg of 264mg /dl. He has not tested in 4 days. Patient has a long history of depression and [facility] They take care of him by taking his glucose, giving him his insulin. This also takes him away from the depressing environment in which he lives. His mother has many medical concerns, his sister has schizophrenia. However the sister is the one that seems to care for the family. Wayne [name] has mental health professionals that come to the house to provide counseling. He does not feel them to be helpful. I encouraged Wayne [name] to take his medication, test his glucose and log in his book. However, I question his ability to manage his glucose effectively in his state of depression. He has recently had right hand surgery by Dr. Mina MarbleWeingold which was successful. He expected to be released to return to work on 12/31/14.

## 2014-12-11 NOTE — Progress Notes (Signed)
Diabetes Self-Management Education  Visit Type:   DSME f/u  Appt. Start Time: 1400 Appt. End Time: 1500  12/11/2014  Mr. Wayne [name], identified by name and date of birth, is a 39 y.o. male with a diagnosis of Diabetes: Type 2.    Patient returns for f/u for T2DM. He has a 14day avg of 264mg /dl. He has not tested in 4 days. Patient has a long history of depression and [facility] They take care of him by taking his glucose, giving him his insulin. This also takes him away from the depressing environment in which he lives. His mother has many medical concerns, his sister has schizophrenia. However the sister is the one that seems to care for the family. Wayne [name] has mental health professionals that come to the house to provide counseling. He does not feel them to be helpful.  I encouraged Wayne [name] to take his medication, test his glucose and log in his book. However, I question his ability to manage his glucose effectively in his state of depression.  He has recently had right hand surgery by Dr. Mina MarbleWeingold which was successful. He expected to be released to return to work on 12/31/14.  ASSESSMENT  There were no vitals taken for this visit. There is no weight on file to calculate BMI.  Psychosocial:   Patient Belief/Attitude about Diabetes: Defeat/Burnout Self-care barriers: Other (comment), Debilitated state due to current medical condition (severe depression) Self-management support: Doctor's office, Family, CDE visits Patient Concerns:  (He does not indicate any concerns about his diabetes) Preferred Learning Style: No preference indicated Learning Readiness: Not Ready  Complications:   How often do you check your blood sugar?: 1-2 times/day (sporadic)  Exercise:  Exercise: ADL's  Individualized Plan for Diabetes Self-Management Training:   Learning Objective:  Patient will have a greater understanding of diabetes self-management.  Patient  education plan per assessed needs and concerns is to attend individual sessions    Education Topics Reviewed with Patient Today:  Reviewed patients medication for diabetes, action, purpose, timing of dose and side effects. Purpose and frequency of SMBG.   Relationship between chronic complications and blood glucose control Worked with patient to identify barriers to care and solutions  Patient Self Evaluation of Goals - Patient rates self as meeting previously set goals:   Nutrition: 25 - 50% Physical Activity: < 25% Medications: 25 - 50% Monitoring: < 25% Problem Solving: < 25% Reducing Risk: < 25% Health Coping: < 25%   Patient Instructions  Test glucose first thing in the morning Take insulin as directed Log glucose readings in log book  Expected Outcomes:  Demonstrated limited interest in learning.  Expect minimal changes  If problems or questions, patient to contact team via:  Phone  Future DSME appointment: - PRN

## 2014-12-11 NOTE — Patient Instructions (Signed)
Test glucose first thing in the morning Take insulin as directed Log glucose readings in log book

## 2014-12-21 ENCOUNTER — Encounter (HOSPITAL_COMMUNITY): Payer: Self-pay | Admitting: Emergency Medicine

## 2014-12-21 ENCOUNTER — Emergency Department (INDEPENDENT_AMBULATORY_CARE_PROVIDER_SITE_OTHER)
Admission: EM | Admit: 2014-12-21 | Discharge: 2014-12-21 | Disposition: A | Discharge status: Home / Self Care | Payer: Self-pay | Source: Home / Self Care | Attending: Family Medicine | Admitting: Family Medicine

## 2014-12-21 DIAGNOSIS — L98491 Non-pressure chronic ulcer of skin of other sites limited to breakdown of skin: Secondary | ICD-10-CM

## 2014-12-21 DIAGNOSIS — L608 Other nail disorders: Secondary | ICD-10-CM

## 2014-12-21 DIAGNOSIS — E10628 Type 1 diabetes mellitus with other skin complications: Secondary | ICD-10-CM

## 2014-12-21 NOTE — Discharge Instructions (Signed)
Diabetes and Foot Care Diabetes may cause you to have problems because of poor blood supply (circulation) to your feet and legs. This may cause the skin on your feet to become thinner, break easier, and heal more slowly. Your skin may become dry, and the skin may peel and crack. You may also have nerve damage in your legs and feet causing decreased feeling in them. You may not notice minor injuries to your feet that could lead to infections or more serious problems. Taking care of your feet is one of the most important things you can do for yourself.  HOME CARE INSTRUCTIONS  Wear shoes at all times, even in the house. Do not go barefoot. Bare feet are easily injured.  Check your feet daily for blisters, cuts, and redness. If you cannot see the bottom of your feet, use a mirror or ask someone for help.  Wash your feet with warm water (do not use hot water) and mild soap. Then pat your feet and the areas between your toes until they are completely dry. Do not soak your feet as this can dry your skin.  Apply a moisturizing lotion or petroleum jelly (that does not contain alcohol and is unscented) to the skin on your feet and to dry, brittle toenails. Do not apply lotion between your toes.  Trim your toenails straight across. Do not dig under them or around the cuticle. File the edges of your nails with an emery board or nail file.  Do not cut corns or calluses or try to remove them with medicine.  Wear clean socks or stockings every day. Make sure they are not too tight. Do not wear knee-high stockings since they may decrease blood flow to your legs.  Wear shoes that fit properly and have enough cushioning. To break in new shoes, wear them for just a few hours a day. This prevents you from injuring your feet. Always look in your shoes before you put them on to be sure there are no objects inside.  Do not cross your legs. This may decrease the blood flow to your feet.  If you find a minor scrape,  cut, or break in the skin on your feet, keep it and the skin around it clean and dry. These areas may be cleansed with mild soap and water. Do not cleanse the area with peroxide, alcohol, or iodine.  When you remove an adhesive bandage, be sure not to damage the skin around it.  If you have a wound, look at it several times a day to make sure it is healing.  Do not use heating pads or hot water bottles. They may burn your skin. If you have lost feeling in your feet or legs, you may not know it is happening until it is too late.  Make sure your health care provider performs a complete foot exam at least annually or more often if you have foot problems. Report any cuts, sores, or bruises to your health care provider immediately. SEEK MEDICAL CARE IF:   You have an injury that is not healing.  You have cuts or breaks in the skin.  You have an ingrown nail.  You notice redness on your legs or feet.  You feel burning or tingling in your legs or feet.  You have pain or cramps in your legs and feet.  Your legs or feet are numb.  Your feet always feel cold. SEEK IMMEDIATE MEDICAL CARE IF:   There is increasing redness,   swelling, or pain in or around a wound.  There is a red line that goes up your leg.  Pus is coming from a wound.  You develop a fever or as directed by your health care provider.  You notice a bad smell coming from an ulcer or wound. Document Released: 07/09/2000 Document Revised: 03/14/2013 Document Reviewed: 12/19/2012 ExitCare Patient Information 2015 [employer]. This information is not intended to replace advice given to you by your health care provider. Make sure you discuss any questions you have with your health care provider.  

## 2014-12-21 NOTE — ED Provider Notes (Signed)
CSN: [id number]     Arrival date & time 12/21/14  1136 History   First MD Initiated Contact with Patient 12/21/14 1349     Chief Complaint  Patient presents with  . Foot Problem  . Nail Problem   (Consider location/radiation/quality/duration/timing/severity/associated sxs/prior Treatment) HPI Comments: 39 year old male with type 1 diabetes mellitus presents with a 55-monthold callous to the plantar aspect of the right foot as well as a dark discoloration of the left great toenail. He denies pain. Admits he has diabetic peripheral neuropathy. Denies any known injury. He has an appointment with his PCP in 3 days.   Past Medical History  Diagnosis Date  . Depression   . Diabetes mellitus   . Hyperlipidemia   . HTN (hypertension)   . Chronic headaches   . Osteoarthritis   . Kidney stones   . Polysubstance abuse     cocaine, marijuana-quit summer 2010  . Tobacco abuse   . Asthma   . Chest pain   . Lumbar disc disease   . GERD (gastroesophageal reflux disease)   . OSA (obstructive sleep apnea)   . Peripheral neuropathy 02/01/2011  . Bipolar 1 disorder   . Ingrown nail 11/12/2013   Past Surgical History  Procedure Laterality Date  . Knee surgery    . Bunionectomy  06-2009   Family History  Problem Relation Age of Onset  . Heart disease Mother   . Diabetes Father   . Hyperlipidemia Father   . Emphysema Maternal Uncle   . Asthma Sister   . Asthma Mother   . Stroke Mother   . Hypertension Father   . Alzheimer's disease Maternal Grandmother   . Depression Father   . Breast cancer Maternal Aunt   . COPD Mother     maternal un   History  Substance Use Topics  . Smoking status: Former Smoker -- 0.20 packs/day for 17 years    Types: Cigarettes    Quit date: 02/24/2011  . Smokeless tobacco: Former [employer] developer . Alcohol Use: 0.0 oz/week    0 Standard drinks or equivalent per week     Comment: socially    Review of Systems  Constitutional: Negative.   Respiratory: Negative.    Cardiovascular: Negative.   Gastrointestinal: Negative.   Musculoskeletal: Negative.   Skin: Positive for wound.  Neurological: Positive for numbness.  All other systems reviewed and are negative.   Allergies  Iodine; Lipitor; Nystatin; and Statins  Home Medications   Prior to Admission medications   Medication Sig Start Date End Date Taking? Authorizing Provider  albuterol (PROVENTIL HFA;VENTOLIN HFA) 108 (90 BASE) MCG/ACT inhaler Inhale 2 puffs into the lungs every 6 (six) hours as needed for wheezing (wheezing). For shortness of breath    Historical Provider, MD  ARIPiprazole (ABILIFY) 10 MG tablet Take 1 tablet (10 mg total) by mouth 2 (two) times daily. 08/09/14   LNiel Hummer NP  aspirin EC 81 MG tablet Take 1 tablet (81 mg total) by mouth daily. 08/09/14   LNiel Hummer NP  atenolol (TENORMIN) 25 MG tablet Take 1 tablet (25 mg total) by mouth daily. 08/09/14   LNiel Hummer NP  budesonide-formoterol (SYMBICORT) 80-4.5 MCG/ACT inhaler Inhale 2 puffs into the lungs 2 (two) times daily. 08/09/14   LNiel Hummer NP  buPROPion (WELLBUTRIN XL) 300 MG 24 hr tablet Take 1 tablet (300 mg total) by mouth daily. 08/09/14   LNiel Hummer NP  dextromethorphan-guaiFENesin ([facility]) 30-600 MG per 12 hr  tablet Take 1 tablet by mouth 2 (two) times daily as needed for cough (cough).    Historical Provider, MD  esomeprazole (NEXIUM) 40 MG capsule Take 1 capsule (40 mg total) by mouth daily. 08/09/14   Niel Hummer, NP  ezetimibe (ZETIA) 10 MG tablet Take 1 tablet (10 mg total) by mouth daily. 08/09/14   Niel Hummer, NP  fluticasone (FLONASE) 50 MCG/ACT nasal spray Place 2 sprays into both nostrils daily. 08/09/14   Niel Hummer, NP  gabapentin (NEURONTIN) 300 MG capsule Take 1 capsule (300 mg total) by mouth 5 (five) times daily. 08/09/14   Niel Hummer, NP  glucose monitoring kit (FREESTYLE) monitoring kit 1 each by Does not apply route as needed for other. For daily blood glucose monitoring  related to diabetes. 08/09/14   Niel Hummer, NP  ibuprofen (ADVIL,MOTRIN) 200 MG tablet Take 600 mg by mouth every 8 (eight) hours as needed for mild pain or moderate pain.     Historical Provider, MD  insulin aspart protamine - aspart (NOVOLOG 70/30 MIX) (70-30) 100 UNIT/ML FlexPen Inject 30 Units into the skin daily with breakfast.    Historical Provider, MD  insulin glargine (LANTUS) 100 UNIT/ML injection Inject 0.45 mLs (45 Units total) into the skin at bedtime. Patient not taking: Reported on 11/12/2014 08/09/14 10/28/15  Niel Hummer, NP  insulin lispro (HUMALOG) 100 UNIT/ML injection Inject 0.1 mLs (10 Units total) into the skin 3 (three) times daily before meals. Patient not taking: Reported on 11/12/2014 08/09/14   Niel Hummer, NP  linagliptin (TRADJENTA) 5 MG TABS tablet Take 5 mg by mouth daily.    Historical Provider, MD  loratadine (CLARITIN) 10 MG tablet Take 1 tablet (10 mg total) by mouth daily. 08/09/14   Niel Hummer, NP  losartan (COZAAR) 25 MG tablet Take 1 tablet (25 mg total) by mouth daily. 08/09/14   Niel Hummer, NP  metFORMIN (GLUCOPHAGE) 1000 MG tablet Take 1 tablet (1,000 mg total) by mouth 2 (two) times daily with a meal. 08/09/14   Niel Hummer, NP  methocarbamol (ROBAXIN) 500 MG tablet Take 500 mg by mouth every 8 (eight) hours as needed for muscle spasms (muscle spasms).  12/10/13   Historical Provider, MD  Multiple Vitamin (MULTIVITAMIN WITH MINERALS) TABS tablet Take 1 tablet by mouth daily. 08/09/14   Niel Hummer, NP  pioglitazone (ACTOS) 15 MG tablet Take 15 mg by mouth daily.    Historical Provider, MD  saxagliptin HCl (ONGLYZA) 2.5 MG TABS tablet Take 2.5 mg by mouth daily.    Historical Provider, MD  sertraline (ZOLOFT) 100 MG tablet Take 1 tablet (100 mg total) by mouth at bedtime. 08/09/14   Niel Hummer, NP  zolpidem (AMBIEN) 5 MG tablet Take 1 tablet (5 mg total) by mouth at bedtime as needed for sleep. 08/09/14   Niel Hummer, NP   BP 114/81 mmHg  Pulse  105  Temp(Src) 98 F (36.7 C) (Oral)  Resp 16  SpO2 98% Physical Exam  Constitutional: He is oriented to person, place, and time. He appears well-developed and well-nourished. No distress.  Pulmonary/Chest: Effort normal. No respiratory distress.  Musculoskeletal: Normal range of motion. He exhibits no edema.  Toes with normal passive range of motion. There is no swelling or deformity appreciated. No erythema or signs of infection. The right plantar callus is hardened and gradually loosening itself from what appears to be healthy granulating tissue underneath. The left great toenail  is darkened approximately three quarters of the way from the cuticle to the hand. There is no apparent nail trauma. No phalangeal tenderness, swelling or redness. No evidence of infection anywhere on either foot. Pedal pulses are 2+. The feet are warm. Active range of motion of toes intact.  Neurological: He is alert and oriented to person, place, and time. He exhibits normal muscle tone.  Skin: Skin is warm and dry.  Psychiatric: He has a normal mood and affect.  Nursing note and vitals reviewed.   ED Course  Procedures (including critical care time) Labs Review Labs Reviewed - No data to display  Imaging Review No results found.   MDM   1. Callous ulcer, limited to breakdown of skin   2. Discoloration of nail   3. Type 1 diabetes mellitus with other skin complications    Patient make sure he keeps his appointment with his PCP in 3 days. I believe he will receive a referral to podiatrist. There are no acute findings on the foot exam. He has a large thick callous formation to the plantar aspect of the right foot in which most of the necrotic, keratin material is dislocating itself from the skin surface. He will need to have that completely removed and Wound care started. He will also need attention to his nails and have them cut properly and possibly treated for onychomycosis. He is stable at this time.  There is no evidence of diminished blood flow, infection or other acute condition.   Janne Napoleon, NP 12/21/14 1447

## 2014-12-21 NOTE — ED Notes (Signed)
Pt states that his great left toe nail has been discolored for over a week he states he cant remember if he hit it on something because he has diabetic neuropathy. Pt has foot callus on the bottom of his right foot that has been there since monday

## 2015-01-06 ENCOUNTER — Encounter (HOSPITAL_COMMUNITY): Payer: Self-pay | Admitting: Emergency Medicine

## 2015-01-06 ENCOUNTER — Emergency Department (HOSPITAL_COMMUNITY)
Admission: EM | Admit: 2015-01-06 | Discharge: 2015-01-06 | Disposition: A | Discharge status: Home / Self Care | Payer: BLUE CROSS/BLUE SHIELD | Attending: Emergency Medicine | Admitting: Emergency Medicine

## 2015-01-06 DIAGNOSIS — I10 Essential (primary) hypertension: Secondary | ICD-10-CM | POA: Insufficient documentation

## 2015-01-06 DIAGNOSIS — Z79899 Other long term (current) drug therapy: Secondary | ICD-10-CM | POA: Insufficient documentation

## 2015-01-06 DIAGNOSIS — Z791 Long term (current) use of non-steroidal anti-inflammatories (NSAID): Secondary | ICD-10-CM | POA: Insufficient documentation

## 2015-01-06 DIAGNOSIS — Z87891 Personal history of nicotine dependence: Secondary | ICD-10-CM | POA: Insufficient documentation

## 2015-01-06 DIAGNOSIS — Z9119 Patient's noncompliance with other medical treatment and regimen: Secondary | ICD-10-CM | POA: Insufficient documentation

## 2015-01-06 DIAGNOSIS — M1990 Unspecified osteoarthritis, unspecified site: Secondary | ICD-10-CM | POA: Insufficient documentation

## 2015-01-06 DIAGNOSIS — G629 Polyneuropathy, unspecified: Secondary | ICD-10-CM | POA: Insufficient documentation

## 2015-01-06 DIAGNOSIS — F319 Bipolar disorder, unspecified: Secondary | ICD-10-CM | POA: Insufficient documentation

## 2015-01-06 DIAGNOSIS — Z7982 Long term (current) use of aspirin: Secondary | ICD-10-CM | POA: Insufficient documentation

## 2015-01-06 DIAGNOSIS — K219 Gastro-esophageal reflux disease without esophagitis: Secondary | ICD-10-CM | POA: Insufficient documentation

## 2015-01-06 DIAGNOSIS — Z91199 Patient's noncompliance with other medical treatment and regimen due to unspecified reason: Secondary | ICD-10-CM

## 2015-01-06 DIAGNOSIS — E1065 Type 1 diabetes mellitus with hyperglycemia: Secondary | ICD-10-CM | POA: Insufficient documentation

## 2015-01-06 DIAGNOSIS — J45909 Unspecified asthma, uncomplicated: Secondary | ICD-10-CM | POA: Insufficient documentation

## 2015-01-06 DIAGNOSIS — E785 Hyperlipidemia, unspecified: Secondary | ICD-10-CM | POA: Insufficient documentation

## 2015-01-06 DIAGNOSIS — Z87442 Personal history of urinary calculi: Secondary | ICD-10-CM | POA: Insufficient documentation

## 2015-01-06 DIAGNOSIS — Z794 Long term (current) use of insulin: Secondary | ICD-10-CM | POA: Insufficient documentation

## 2015-01-06 DIAGNOSIS — Z7951 Long term (current) use of inhaled steroids: Secondary | ICD-10-CM | POA: Insufficient documentation

## 2015-01-06 DIAGNOSIS — Z872 Personal history of diseases of the skin and subcutaneous tissue: Secondary | ICD-10-CM | POA: Insufficient documentation

## 2015-01-06 LAB — COMPREHENSIVE METABOLIC PANEL
ALT: 16 U/L — ABNORMAL LOW (ref 17–63)
AST: 23 U/L (ref 15–41)
Albumin: 3.9 g/dL (ref 3.5–5.0)
Alkaline Phosphatase: 90 U/L (ref 38–126)
Anion gap: 12 (ref 5–15)
BUN: 17 mg/dL (ref 6–20)
CO2: 26 mmol/L (ref 22–32)
CREATININE: 0.95 mg/dL (ref 0.61–1.24)
Calcium: 9.5 mg/dL (ref 8.9–10.3)
Chloride: 98 mmol/L — ABNORMAL LOW (ref 101–111)
GFR calc Af Amer: >60 mL/min (ref >60)
GFR calc non Af Amer: >60 mL/min (ref >60)
GLUCOSE: 534 mg/dL — AB (ref 65–99)
POTASSIUM: 4.5 mmol/L (ref 3.5–5.1)
Sodium: 136 mmol/L (ref 135–145)
Total Bilirubin: 0.7 mg/dL (ref 0.3–1.2)
Total Protein: 7.8 g/dL (ref 6.5–8.1)

## 2015-01-06 LAB — URINALYSIS, ROUTINE W REFLEX MICROSCOPIC
BILIRUBIN URINE: NEGATIVE
Hgb urine dipstick: NEGATIVE
Ketones, ur: 15 mg/dL — AB
Leukocytes, UA: NEGATIVE
NITRITE: NEGATIVE
PH: 5.5 (ref 5.0–8.0)
Protein, ur: NEGATIVE mg/dL
SPECIFIC GRAVITY, URINE: 1.043 — AB (ref 1.005–1.030)
Urobilinogen, UA: 0.2 mg/dL (ref 0.0–1.0)

## 2015-01-06 LAB — CBG MONITORING, ED
GLUCOSE-CAPILLARY: 315 mg/dL — AB (ref 65–99)
GLUCOSE-CAPILLARY: 190 mg/dL — AB (ref 65–99)
Glucose-Capillary: 499 mg/dL — ABNORMAL HIGH (ref 65–99)

## 2015-01-06 LAB — CBC
HCT: 39.9 % (ref 39.0–52.0)
Hemoglobin: 14.3 g/dL (ref 13.0–17.0)
MCH: 31.3 pg (ref 26.0–34.0)
MCHC: 35.8 g/dL (ref 30.0–36.0)
MCV: 87.3 fL (ref 78.0–100.0)
PLATELETS: 143 10*3/uL — AB (ref 150–400)
RBC: 4.57 MIL/uL (ref 4.22–5.81)
RDW: 12.3 % (ref 11.5–15.5)
WBC: 4.1 10*3/uL (ref 4.0–10.5)

## 2015-01-06 LAB — URINE MICROSCOPIC-ADD ON: Urine-Other: NONE SEEN

## 2015-01-06 MED ORDER — INSULIN ASPART 100 UNIT/ML ~~LOC~~ SOLN
10.0000 [IU] | Freq: Once | SUBCUTANEOUS | Status: AC
  Administered 2015-01-06: 10 [IU] via SUBCUTANEOUS
  Filled 2015-01-06: qty 1

## 2015-01-06 MED ORDER — SODIUM CHLORIDE 0.9 % IV SOLN
1000.0000 mL | INTRAVENOUS | Status: DC
  Administered 2015-01-06: 1000 mL via INTRAVENOUS

## 2015-01-06 MED ORDER — SODIUM CHLORIDE 0.9 % IV SOLN
1000.0000 mL | Freq: Once | INTRAVENOUS | Status: AC
  Administered 2015-01-06: 1000 mL via INTRAVENOUS

## 2015-01-06 NOTE — ED Provider Notes (Signed)
CSN: [id number]     Arrival date & time 01/06/15  1500 History   First MD Initiated Contact with Patient 01/06/15 1656     Chief Complaint  Patient presents with  . Fatigue  . Nausea  . Diarrhea  . Emesis     (Consider location/radiation/quality/duration/timing/severity/associated sxs/prior Treatment) HPI The patient reports he hasn't taken his insulin for almost a week. He doesn't have a good explanation for why he is not taking it. He reports he is taking metformin and his blood sugars gotten too low at certain times. He reports this is particularly been a problem since he started a new medication,Onglyza. Now he reports that he has got abdominal cramping and vomited 5 times today. There has been no associated diarrhea. No associated fever. Patient reports generalized fatigue. Past Medical History  Diagnosis Date  . Depression   . Diabetes mellitus   . Hyperlipidemia   . HTN (hypertension)   . Chronic headaches   . Osteoarthritis   . Kidney stones   . Polysubstance abuse     cocaine, marijuana-quit summer 2010  . Tobacco abuse   . Asthma   . Chest pain   . Lumbar disc disease   . GERD (gastroesophageal reflux disease)   . OSA (obstructive sleep apnea)   . Peripheral neuropathy 02/01/2011  . Bipolar 1 disorder   . Ingrown nail 11/12/2013   Past Surgical History  Procedure Laterality Date  . Knee surgery    . Bunionectomy  06-2009   Family History  Problem Relation Age of Onset  . Heart disease Mother   . Diabetes Father   . Hyperlipidemia Father   . Emphysema Maternal Uncle   . Asthma Sister   . Asthma Mother   . Stroke Mother   . Hypertension Father   . Alzheimer's disease Maternal Grandmother   . Depression Father   . Breast cancer Maternal Aunt   . COPD Mother     maternal un   History  Substance Use Topics  . Smoking status: Former Smoker -- 0.20 packs/day for 17 years    Types: Cigarettes    Quit date: 02/24/2011  . Smokeless tobacco: Former Systems developer  .  Alcohol Use: 0.0 oz/week    0 Standard drinks or equivalent per week     Comment: socially    Review of Systems 10 Systems reviewed and are negative for acute change except as noted in the HPI.    Allergies  Iodine; Lipitor; Nystatin; and Statins  Home Medications   Prior to Admission medications   Medication Sig Start Date End Date Taking? Authorizing Provider  albuterol (PROVENTIL HFA;VENTOLIN HFA) 108 (90 BASE) MCG/ACT inhaler Inhale 2 puffs into the lungs every 6 (six) hours as needed for wheezing (wheezing). For shortness of breath   Yes Historical Provider, MD  ARIPiprazole (ABILIFY) 20 MG tablet Take 10 mg by mouth 2 (two) times daily.   Yes Historical Provider, MD  budesonide-formoterol (SYMBICORT) 80-4.5 MCG/ACT inhaler Inhale 2 puffs into the lungs 2 (two) times daily. 08/09/14  Yes Niel Hummer, NP  buPROPion (WELLBUTRIN XL) 300 MG 24 hr tablet Take 1 tablet (300 mg total) by mouth daily. 08/09/14  Yes Niel Hummer, NP  dextromethorphan-guaiFENesin [facility] DM) 30-600 MG per 12 hr tablet Take 1 tablet by mouth 2 (two) times daily as needed for cough (cough).   Yes Historical Provider, MD  esomeprazole (NEXIUM) 40 MG capsule Take 1 capsule (40 mg total) by mouth daily. 08/09/14  Yes  Niel Hummer, NP  ezetimibe (ZETIA) 10 MG tablet Take 1 tablet (10 mg total) by mouth daily. 08/09/14  Yes Niel Hummer, NP  ezetimibe (ZETIA) 10 MG tablet Take 10 mg by mouth daily.   Yes Historical Provider, MD  fluticasone (FLONASE) 50 MCG/ACT nasal spray Place 2 sprays into both nostrils daily. 08/09/14  Yes Niel Hummer, NP  gabapentin (NEURONTIN) 300 MG capsule Take 1 capsule (300 mg total) by mouth 5 (five) times daily. 08/09/14  Yes Niel Hummer, NP  glucose monitoring kit (FREESTYLE) monitoring kit 1 each by Does not apply route as needed for other. For daily blood glucose monitoring related to diabetes. 08/09/14  Yes Niel Hummer, NP  ibuprofen (ADVIL,MOTRIN) 200 MG tablet Take 600 mg by  mouth every 8 (eight) hours as needed for mild pain or moderate pain.    Yes Historical Provider, MD  insulin detemir (LEVEMIR) 100 UNIT/ML injection Inject 45 Units into the skin at bedtime.   Yes Historical Provider, MD  insulin lispro (HUMALOG) 100 UNIT/ML injection Inject 0.1 mLs (10 Units total) into the skin 3 (three) times daily before meals. Patient taking differently: Inject 10 Units into the skin 3 (three) times daily as needed for high blood sugar (with meals).  08/09/14  Yes Niel Hummer, NP  loratadine (CLARITIN) 10 MG tablet Take 1 tablet (10 mg total) by mouth daily. 08/09/14  Yes Niel Hummer, NP  losartan (COZAAR) 25 MG tablet Take 1 tablet (25 mg total) by mouth daily. 08/09/14  Yes Niel Hummer, NP  meloxicam (MOBIC) 15 MG tablet Take 15 mg by mouth daily.   Yes Historical Provider, MD  metFORMIN (GLUCOPHAGE) 1000 MG tablet Take 1 tablet (1,000 mg total) by mouth 2 (two) times daily with a meal. 08/09/14  Yes Niel Hummer, NP  methocarbamol (ROBAXIN) 500 MG tablet Take 500 mg by mouth every 8 (eight) hours as needed for muscle spasms (muscle spasms).  12/10/13  Yes Historical Provider, MD  saxagliptin HCl (ONGLYZA) 5 MG TABS tablet Take 5 mg by mouth daily.   Yes Historical Provider, MD  sertraline (ZOLOFT) 100 MG tablet Take 1 tablet (100 mg total) by mouth at bedtime. 08/09/14  Yes Niel Hummer, NP  traMADol (ULTRAM) 50 MG tablet Take 1 tablet by mouth every 6 (six) hours as needed. pain 11/18/14  Yes Historical Provider, MD  zolpidem (AMBIEN) 5 MG tablet Take 1 tablet (5 mg total) by mouth at bedtime as needed for sleep. 08/09/14  Yes Niel Hummer, NP  ARIPiprazole (ABILIFY) 10 MG tablet Take 1 tablet (10 mg total) by mouth 2 (two) times daily. Patient not taking: Reported on 01/06/2015 08/09/14   Niel Hummer, NP  aspirin EC 81 MG tablet Take 1 tablet (81 mg total) by mouth daily. Patient not taking: Reported on 01/06/2015 08/09/14   Niel Hummer, NP  atenolol (TENORMIN) 25 MG  tablet Take 1 tablet (25 mg total) by mouth daily. Patient not taking: Reported on 01/06/2015 08/09/14   Niel Hummer, NP  insulin glargine (LANTUS) 100 UNIT/ML injection Inject 0.45 mLs (45 Units total) into the skin at bedtime. Patient not taking: Reported on 11/12/2014 08/09/14 10/28/15  Niel Hummer, NP  Multiple Vitamin (MULTIVITAMIN WITH MINERALS) TABS tablet Take 1 tablet by mouth daily. Patient not taking: Reported on 01/06/2015 08/09/14   Niel Hummer, NP   BP 114/68 mmHg  Pulse 96  Temp(Src) 98.3 F (36.8 C) (Oral)  Resp 20  SpO2 99% Physical Exam  Constitutional: He is oriented to person, place, and time. He appears well-developed and well-nourished.  HENT:  Head: Normocephalic and atraumatic.  Eyes: EOM are normal. Pupils are equal, round, and reactive to light.  Neck: Neck supple.  Cardiovascular: Normal rate, regular rhythm, normal heart sounds and intact distal pulses.   Pulmonary/Chest: Effort normal and breath sounds normal.  Abdominal: Soft. Bowel sounds are normal. He exhibits no distension. There is no tenderness.  Musculoskeletal: Normal range of motion. He exhibits no edema.  Neurological: He is alert and oriented to person, place, and time. He has normal strength. Coordination normal. GCS eye subscore is 4. GCS verbal subscore is 5. GCS motor subscore is 6.  Skin: Skin is warm, dry and intact.  Psychiatric: He has a normal mood and affect.    ED Course  Procedures (including critical care time) Labs Review Labs Reviewed  CBC - Abnormal; Notable for the following:    Platelets 143 (*)    All other components within normal limits  COMPREHENSIVE METABOLIC PANEL - Abnormal; Notable for the following:    Chloride 98 (*)    Glucose, Bld 534 (*)    ALT 16 (*)    All other components within normal limits  URINALYSIS, ROUTINE W REFLEX MICROSCOPIC (NOT AT [facility]) - Abnormal; Notable for the following:    Specific Gravity, Urine 1.043 (*)    Glucose, UA >1000 (*)     Ketones, ur 15 (*)    All other components within normal limits  CBG MONITORING, ED - Abnormal; Notable for the following:    Glucose-Capillary 499 (*)    All other components within normal limits  CBG MONITORING, ED - Abnormal; Notable for the following:    Glucose-Capillary 315 (*)    All other components within normal limits  CBG MONITORING, ED - Abnormal; Notable for the following:    Glucose-Capillary 190 (*)    All other components within normal limits  URINE MICROSCOPIC-ADD ON    Imaging Review No results found.   EKG Interpretation None      MDM   Final diagnoses:  Hyperglycemia due to type 1 diabetes mellitus  Noncompliance   The patient is nontoxic. His mental status is clear. At this time the biggest problem appears to be noncompliance. The patient has responded positively to fluid and insulin administration. The patient is safe for discharge at this point in time with instructions for follow-up with his primary care physician in ongoing management of his diabetes. He is instructed to resume taking his prescribed medications and insulin.    Charlesetta Shanks, MD 01/06/15 2028

## 2015-01-06 NOTE — ED Notes (Addendum)
Pt c/o N/V,generalized weakness, and elevated blood sugar since last evening.  Pt has not been taking insulin x several days. Pt states he is "tired" of taking his medications.

## 2015-01-06 NOTE — Discharge Instructions (Signed)
How to Avoid Diabetes Problems You can do a lot to prevent or slow down diabetes problems. Following your diabetes plan and taking care of yourself can reduce your risk of serious or life-threatening complications. Below, you will find certain things you can do to prevent diabetes problems. MANAGE YOUR DIABETES Follow your health care provider's, nurse educator's, and dietitian's instructions for managing your diabetes. They will teach you the basics of diabetes care. They can help answer questions you may have. Learn about diabetes and make healthy choices regarding eating and physical activity. Monitor your blood glucose level regularly. Your health care provider will help you decide how often to check your blood glucose level depending on your treatment goals and how well you are meeting them.  DO NOT USE NICOTINE Nicotine and diabetes are a dangerous combination. Nicotine raises your risk for diabetes problems. If you quit using nicotine, you will lower your risk for heart attack, stroke, nerve disease, and kidney disease. Your cholesterol and your blood pressure levels may improve. Your blood circulation will also improve. Do not use any tobacco products, including cigarettes, chewing tobacco, or electronic cigarettes. If you need help quitting, ask your health care provider. KEEP YOUR BLOOD PRESSURE UNDER CONTROL Keeping your blood pressure under control will help prevent damage to your eyes, kidneys, heart, and blood vessels. Blood pressure consists of two numbers. The top number should be below 120, and the bottom number should be below 80 (120/80). Keep your blood pressure as close to these numbers as you can. If you already have kidney disease, you may want even lower blood pressure to protect your kidneys. Talk to your health care provider to make sure that your blood pressure goal is right for your needs. Meal planning, medicines, and exercise can help you reach your blood pressure target. Have  your blood pressure checked at every visit with your health care provider. KEEP YOUR CHOLESTEROL UNDER CONTROL Normal cholesterol levels will help prevent heart disease and stroke. These are the biggest health problems for people with diabetes. Keeping cholesterol levels under control can also help with blood flow. Have your cholesterol level checked at least once a year. Your health care provider may prescribe a medicine known as a statin. Statins lower your cholesterol. If you are not taking a statin, ask your health care provider if you should be. Meal planning, exercise, and medicines can help you reach your cholesterol targets.  SCHEDULE AND KEEP YOUR ANNUAL PHYSICAL EXAMS AND EYE EXAMS Your health care provider will tell you how often he or she wants to see you depending on your plan of treatment. It is important that you keep these appointments so that possible problems can be identified early and complications can be avoided or treated.  Every visit with your health care provider should include your weight, blood pressure, and an evaluation of your blood glucose control.  Your hemoglobin A1c should be checked:  At least twice a year if you are at your goal.  Every 3 months if there are changes in treatment.  If you are not meeting your goals.  Your blood lipids should be checked yearly. You should also be checked yearly to see if you have protein in your urine (microalbumin).  Schedule a dilated eye exam within 5 years of your diagnosis if you have type 1 diabetes, and then yearly. Schedule a dilated eye exam at diagnosis if you have type 2 diabetes, and then yearly. All exams thereafter can be extended to every 2   to 3 years if one or more exams have been normal. KEEP YOUR VACCINES CURRENT The flu vaccine is recommended yearly. The formula for the vaccine changes every year and needs to be updated for the best protection against current viruses. It is recommended that people with diabetes  who are over 65 years old get the pneumonia vaccine. In some cases, two separate shots may be given. Ask your health care provider if your pneumonia vaccination is up-to-date. However, there are some instances where another vaccine is recommended. Check with your health care provider. TAKE CARE OF YOUR FEET  Diabetes may cause you to have a poor blood supply (circulation) to your legs and feet. Because of this, the skin may be thinner, break easier, and heal more slowly. You also may have nerve damage in your legs and feet, causing decreased feeling. You may not notice minor injuries to your feet that could lead to serious problems or infections. Taking care of your feet is very important. Visual foot exams are performed at every routine medical visit. The exams check for cuts, injuries, or other problems with the feet. A comprehensive foot exam should be done yearly. This includes visual inspection as well as assessing foot pulses and testing for loss of sensation. You should also do the following:  Inspect your feet daily for cuts, calluses, blisters, ingrown toenails, and signs of infection, such as redness, swelling, or pus.  Wash and dry your feet thoroughly, especially between the toes.  Avoid soaking your feet regularly in hot water baths.  Moisturize dry skin with lotion, avoiding areas between your toes.  Cut toenails straight across and file the edges.  Avoid shoes that do not fit well or have areas that irritate your skin.  Avoid going barefooted or wearing only socks. Your feet need protection. TAKE CARE OF YOUR TEETH People with poorly controlled diabetes are more likely to have gum (periodontal) disease. These infections make diabetes harder to control. Periodontal diseases, if left untreated, can lead to tooth loss. Brush your teeth twice a day, floss, and see your dentist for checkups and cleaning every 6 months, or 2 times a year. ASK YOUR HEALTH CARE PROVIDER ABOUT TAKING  ASPIRIN Taking aspirin daily is recommended to help prevent cardiovascular disease in people with and without diabetes. Ask your health care provider if this would benefit you and what dose he or she would recommend. DRINK RESPONSIBLY Moderate amounts of alcohol (less than 1 drink per day for adult women and less than 2 drinks per day for adult men) have a minimal effect on blood glucose if ingested with food. It is important to eat food with alcohol to avoid hypoglycemia. People should avoid alcohol if they have a history of alcohol abuse or dependence, if they are pregnant, and if they have liver disease, pancreatitis, advanced neuropathy, or severe hypertriglyceridemia. LESSEN STRESS Living with diabetes can be stressful. When you are under stress, your blood glucose may be affected in two ways:  Stress hormones may cause your blood glucose to rise.  You may be distracted from taking good care of yourself. It is a good idea to be aware of your stress level and make changes that are necessary to help you better manage challenging situations. Support groups, planned relaxation, a hobby you enjoy, meditation, healthy relationships, and exercise all work to lower your stress level. If your efforts do not seem to be helping, get help from your health care provider or a trained mental health professional. Document   Released: 03/30/2011 Document Revised: 11/26/2013 Document Reviewed: 09/05/2013 [employer] Patient Information 2015 [employer]. This information is not intended to replace advice given to you by your health care provider. Make sure you discuss any questions you have with your health care provider.  

## 2015-01-06 NOTE — ED Notes (Signed)
CBG 315  

## 2015-01-06 NOTE — ED Notes (Addendum)
Pt is not taking insulin as prescribed, pt c/o fatique, n/v/d. Pt took 4 metformin instead of insulin last night. Crisis counselor also stated that pt endorsed that he would be better off not here.

## 2015-01-09 ENCOUNTER — Encounter (HOSPITAL_COMMUNITY): Payer: Self-pay | Admitting: Emergency Medicine

## 2015-01-09 ENCOUNTER — Emergency Department (HOSPITAL_COMMUNITY)
Admission: EM | Admit: 2015-01-09 | Discharge: 2015-01-09 | Disposition: A | Discharge status: Home / Self Care | Payer: Self-pay | Attending: Emergency Medicine | Admitting: Emergency Medicine

## 2015-01-09 ENCOUNTER — Emergency Department (HOSPITAL_COMMUNITY): Payer: Self-pay

## 2015-01-09 DIAGNOSIS — K219 Gastro-esophageal reflux disease without esophagitis: Secondary | ICD-10-CM | POA: Insufficient documentation

## 2015-01-09 DIAGNOSIS — Z87442 Personal history of urinary calculi: Secondary | ICD-10-CM | POA: Insufficient documentation

## 2015-01-09 DIAGNOSIS — I10 Essential (primary) hypertension: Secondary | ICD-10-CM | POA: Insufficient documentation

## 2015-01-09 DIAGNOSIS — Z87891 Personal history of nicotine dependence: Secondary | ICD-10-CM | POA: Insufficient documentation

## 2015-01-09 DIAGNOSIS — F319 Bipolar disorder, unspecified: Secondary | ICD-10-CM | POA: Insufficient documentation

## 2015-01-09 DIAGNOSIS — Z794 Long term (current) use of insulin: Secondary | ICD-10-CM | POA: Insufficient documentation

## 2015-01-09 DIAGNOSIS — Z791 Long term (current) use of non-steroidal anti-inflammatories (NSAID): Secondary | ICD-10-CM | POA: Insufficient documentation

## 2015-01-09 DIAGNOSIS — Z79899 Other long term (current) drug therapy: Secondary | ICD-10-CM | POA: Insufficient documentation

## 2015-01-09 DIAGNOSIS — Z7982 Long term (current) use of aspirin: Secondary | ICD-10-CM | POA: Insufficient documentation

## 2015-01-09 DIAGNOSIS — J45909 Unspecified asthma, uncomplicated: Secondary | ICD-10-CM | POA: Insufficient documentation

## 2015-01-09 DIAGNOSIS — R52 Pain, unspecified: Secondary | ICD-10-CM

## 2015-01-09 DIAGNOSIS — R0789 Other chest pain: Secondary | ICD-10-CM

## 2015-01-09 DIAGNOSIS — M1990 Unspecified osteoarthritis, unspecified site: Secondary | ICD-10-CM | POA: Insufficient documentation

## 2015-01-09 DIAGNOSIS — G4733 Obstructive sleep apnea (adult) (pediatric): Secondary | ICD-10-CM | POA: Insufficient documentation

## 2015-01-09 DIAGNOSIS — Z7951 Long term (current) use of inhaled steroids: Secondary | ICD-10-CM | POA: Insufficient documentation

## 2015-01-09 DIAGNOSIS — R739 Hyperglycemia, unspecified: Secondary | ICD-10-CM

## 2015-01-09 DIAGNOSIS — E1165 Type 2 diabetes mellitus with hyperglycemia: Secondary | ICD-10-CM | POA: Insufficient documentation

## 2015-01-09 DIAGNOSIS — E785 Hyperlipidemia, unspecified: Secondary | ICD-10-CM | POA: Insufficient documentation

## 2015-01-09 LAB — CBC
HEMATOCRIT: 36.4 % — AB (ref 39.0–52.0)
Hemoglobin: 12.7 g/dL — ABNORMAL LOW (ref 13.0–17.0)
MCH: 30.6 pg (ref 26.0–34.0)
MCHC: 34.9 g/dL (ref 30.0–36.0)
MCV: 87.7 fL (ref 78.0–100.0)
Platelets: 134 10*3/uL — ABNORMAL LOW (ref 150–400)
RBC: 4.15 MIL/uL — AB (ref 4.22–5.81)
RDW: 12.6 % (ref 11.5–15.5)
WBC: 3.5 10*3/uL — AB (ref 4.0–10.5)

## 2015-01-09 LAB — BASIC METABOLIC PANEL
Anion gap: 8 (ref 5–15)
BUN: 10 mg/dL (ref 6–20)
CALCIUM: 8.7 mg/dL — AB (ref 8.9–10.3)
CO2: 24 mmol/L (ref 22–32)
Chloride: 106 mmol/L (ref 101–111)
Creatinine, Ser: 1.02 mg/dL (ref 0.61–1.24)
GFR calc Af Amer: >60 mL/min (ref >60)
GFR calc non Af Amer: >60 mL/min (ref >60)
GLUCOSE: 435 mg/dL — AB (ref 65–99)
Potassium: 4.2 mmol/L (ref 3.5–5.1)
SODIUM: 138 mmol/L (ref 135–145)

## 2015-01-09 LAB — CBG MONITORING, ED
Glucose-Capillary: 380 mg/dL — ABNORMAL HIGH (ref 65–99)
Glucose-Capillary: 350 mg/dL — ABNORMAL HIGH (ref 65–99)

## 2015-01-09 LAB — I-STAT TROPONIN, ED: Troponin i, poc: 0 ng/mL (ref 0.00–0.08)

## 2015-01-09 MED ORDER — INSULIN ASPART 100 UNIT/ML ~~LOC~~ SOLN
5.0000 [IU] | Freq: Once | SUBCUTANEOUS | Status: AC
  Administered 2015-01-09: 5 [IU] via INTRAVENOUS
  Filled 2015-01-09: qty 1

## 2015-01-09 MED ORDER — LANCET DEVICES MISC: Status: DC

## 2015-01-09 MED ORDER — FREESTYLE SYSTEM KIT: 1.0000 | PACK | Status: DC | PRN

## 2015-01-09 MED ORDER — ALBUTEROL SULFATE HFA 108 (90 BASE) MCG/ACT IN AERS: 2.0000 | INHALATION_SPRAY | Freq: Four times a day (QID) | RESPIRATORY_TRACT | Status: DC | PRN

## 2015-01-09 MED ORDER — SODIUM CHLORIDE 0.9 % IV BOLUS (SEPSIS)
1000.0000 mL | Freq: Once | INTRAVENOUS | Status: AC
Start: 2015-01-09 — End: 2015-01-09
  Administered 2015-01-09: 1000 mL via INTRAVENOUS

## 2015-01-09 MED ORDER — INSULIN LISPRO 100 UNIT/ML ~~LOC~~ SOLN: 10.0000 [IU] | Freq: Three times a day (TID) | SUBCUTANEOUS | Status: DC

## 2015-01-09 MED ORDER — SODIUM CHLORIDE 0.9 % IV BOLUS (SEPSIS)
1000.0000 mL | Freq: Once | INTRAVENOUS | Status: AC
  Administered 2015-01-09: 1000 mL via INTRAVENOUS

## 2015-01-09 MED ORDER — INSULIN GLARGINE 100 UNIT/ML ~~LOC~~ SOLN: 45.0000 [IU] | Freq: Every day | SUBCUTANEOUS | Status: DC

## 2015-01-09 MED ORDER — GLUCOSE BLOOD VI STRP: ORAL_STRIP | Status: DC

## 2015-01-09 MED ORDER — BUDESONIDE-FORMOTEROL FUMARATE 80-4.5 MCG/ACT IN AERO: 2.0000 | INHALATION_SPRAY | Freq: Two times a day (BID) | RESPIRATORY_TRACT | Status: DC

## 2015-01-09 MED ORDER — TRUE METRIX METER W/DEVICE KIT: 1.0000 | PACK | Status: DC | PRN

## 2015-01-09 NOTE — ED Provider Notes (Signed)
CSN: [id number]     Arrival date & time 01/09/15  1354 History   First MD Initiated Contact with Patient 01/09/15 1359     Chief Complaint  Patient presents with  . Chest Pain     (Consider location/radiation/quality/duration/timing/severity/associated sxs/prior Treatment) HPI   Blood pressure 111/78, pulse 95, temperature 98.7 F (37.1 C), temperature source Oral, resp. rate 13, height 6' (1.829 m), weight 160 lb (72.576 kg), SpO2 99 %.  Wayne [name] is a 39 y.o. male with past medical history significant for insulin-dependent diabetes, hypertension, hyperlipidemia polysubstance abuse and asthma complaining of nausea and vomiting for the last 4 days. Patient states he is not taking his insulin because he cannot afford it, he is currently uninsured. He states that his psychiatrist came to visit him at the house and EMS was called. When EMS arrived patient developed shortness of breath and pressure-like left-sided nonradiating chest pain. He at his worst he rates the pain at 8 out of 10, he states that this resolved spontaneously within about 15 minutes. He was given a full dose aspirin by EMS but nitroglycerin was held because his systolic was soft in the 98X. Patient was seen for similar several days ago, he was suicidal at that time that this time he denies any suicidal ideation, homicidal ideation, auditory or visual hallucinations.  Past Medical History  Diagnosis Date  . Depression   . Diabetes mellitus   . Hyperlipidemia   . HTN (hypertension)   . Chronic headaches   . Osteoarthritis   . Kidney stones   . Polysubstance abuse     cocaine, marijuana-quit summer 2010  . Tobacco abuse   . Asthma   . Chest pain   . Lumbar disc disease   . GERD (gastroesophageal reflux disease)   . OSA (obstructive sleep apnea)   . Peripheral neuropathy 02/01/2011  . Bipolar 1 disorder   . Ingrown nail 11/12/2013   Past Surgical History  Procedure Laterality Date  . Knee surgery    . Bunionectomy   06-2009   Family History  Problem Relation Age of Onset  . Heart disease Mother   . Diabetes Father   . Hyperlipidemia Father   . Emphysema Maternal Uncle   . Asthma Sister   . Asthma Mother   . Stroke Mother   . Hypertension Father   . Alzheimer's disease Maternal Grandmother   . Depression Father   . Breast cancer Maternal Aunt   . COPD Mother     maternal un   History  Substance Use Topics  . Smoking status: Former Smoker -- 0.20 packs/day for 17 years    Types: Cigarettes    Quit date: 02/24/2011  . Smokeless tobacco: Former Systems developer  . Alcohol Use: 0.0 oz/week    0 Standard drinks or equivalent per week     Comment: socially    Review of Systems  10 systems reviewed and found to be negative, except as noted in the HPI.   Allergies  Iodine; Lipitor; Nystatin; and Statins  Home Medications   Prior to Admission medications   Medication Sig Start Date End Date Taking? Authorizing Provider  albuterol (PROVENTIL HFA;VENTOLIN HFA) 108 (90 BASE) MCG/ACT inhaler Inhale 2 puffs into the lungs every 6 (six) hours as needed for wheezing (wheezing). For shortness of breath 01/09/15   Elmyra Ricks Freeland Pracht, PA-C  ARIPiprazole (ABILIFY) 10 MG tablet Take 1 tablet (10 mg total) by mouth 2 (two) times daily. Patient not taking: Reported on 01/06/2015 08/09/14  Niel Hummer, NP  ARIPiprazole (ABILIFY) 20 MG tablet Take 10 mg by mouth 2 (two) times daily.    Historical Provider, MD  aspirin EC 81 MG tablet Take 1 tablet (81 mg total) by mouth daily. Patient not taking: Reported on 01/06/2015 08/09/14   Niel Hummer, NP  atenolol (TENORMIN) 25 MG tablet Take 1 tablet (25 mg total) by mouth daily. Patient not taking: Reported on 01/06/2015 08/09/14   Niel Hummer, NP  budesonide-formoterol [facility]) 80-4.5 MCG/ACT inhaler Inhale 2 puffs into the lungs 2 (two) times daily. 01/09/15   Kadyn Chovan, PA-C  buPROPion (WELLBUTRIN XL) 300 MG 24 hr tablet Take 1 tablet (300 mg total) by mouth  daily. 08/09/14   Niel Hummer, NP  dextromethorphan-guaiFENesin [facility] DM) 30-600 MG per 12 hr tablet Take 1 tablet by mouth 2 (two) times daily as needed for cough (cough).    Historical Provider, MD  esomeprazole (NEXIUM) 40 MG capsule Take 1 capsule (40 mg total) by mouth daily. 08/09/14   Niel Hummer, NP  ezetimibe (ZETIA) 10 MG tablet Take 1 tablet (10 mg total) by mouth daily. 08/09/14   Niel Hummer, NP  ezetimibe (ZETIA) 10 MG tablet Take 10 mg by mouth daily.    Historical Provider, MD  fluticasone (FLONASE) 50 MCG/ACT nasal spray Place 2 sprays into both nostrils daily. 08/09/14   Niel Hummer, NP  gabapentin (NEURONTIN) 300 MG capsule Take 1 capsule (300 mg total) by mouth 5 (five) times daily. 08/09/14   Niel Hummer, NP  glucose monitoring kit (FREESTYLE) monitoring kit 1 each by Does not apply route as needed for other. For daily blood glucose monitoring related to diabetes. 01/09/15   Jaysean Manville, PA-C  ibuprofen (ADVIL,MOTRIN) 200 MG tablet Take 600 mg by mouth every 8 (eight) hours as needed for mild pain or moderate pain.     Historical Provider, MD  insulin detemir (LEVEMIR) 100 UNIT/ML injection Inject 45 Units into the skin at bedtime.    Historical Provider, MD  insulin glargine (LANTUS) 100 UNIT/ML injection Inject 0.45 mLs (45 Units total) into the skin at bedtime. 01/09/15 03/29/16  Alverta Caccamo, PA-C  insulin lispro (HUMALOG) 100 UNIT/ML injection Inject 0.1 mLs (10 Units total) into the skin 3 (three) times daily before meals. 01/09/15   Milynn Quirion, PA-C  loratadine (CLARITIN) 10 MG tablet Take 1 tablet (10 mg total) by mouth daily. 08/09/14   Niel Hummer, NP  losartan (COZAAR) 25 MG tablet Take 1 tablet (25 mg total) by mouth daily. 08/09/14   Niel Hummer, NP  meloxicam (MOBIC) 15 MG tablet Take 15 mg by mouth daily.    Historical Provider, MD  metFORMIN (GLUCOPHAGE) 1000 MG tablet Take 1 tablet (1,000 mg total) by mouth 2 (two) times daily with a meal.  08/09/14   Niel Hummer, NP  methocarbamol (ROBAXIN) 500 MG tablet Take 500 mg by mouth every 8 (eight) hours as needed for muscle spasms (muscle spasms).  12/10/13   Historical Provider, MD  Multiple Vitamin (MULTIVITAMIN WITH MINERALS) TABS tablet Take 1 tablet by mouth daily. Patient not taking: Reported on 01/06/2015 08/09/14   Niel Hummer, NP  saxagliptin HCl (ONGLYZA) 5 MG TABS tablet Take 5 mg by mouth daily.    Historical Provider, MD  sertraline (ZOLOFT) 100 MG tablet Take 1 tablet (100 mg total) by mouth at bedtime. 08/09/14   Niel Hummer, NP  traMADol (ULTRAM) 50 MG tablet Take 1  tablet by mouth every 6 (six) hours as needed. pain 11/18/14   Historical Provider, MD  zolpidem (AMBIEN) 5 MG tablet Take 1 tablet (5 mg total) by mouth at bedtime as needed for sleep. 08/09/14   Niel Hummer, NP   BP 115/78 mmHg  Pulse 91  Temp(Src) 98.7 F (37.1 C) (Oral)  Resp 15  Ht 6' (1.829 m)  Wt 160 lb (72.576 kg)  BMI 21.70 kg/m2  SpO2 99% Physical Exam  Constitutional: He is oriented to person, place, and time. He appears well-developed and well-nourished. No distress.  HENT:  Head: Normocephalic.  Mouth/Throat: Oropharynx is clear and moist.  Eyes: Conjunctivae are normal.  Neck: Normal range of motion. No JVD present. No tracheal deviation present.  Cardiovascular: Normal rate, regular rhythm and intact distal pulses.   Radial pulse equal bilaterally  Pulmonary/Chest: Effort normal and breath sounds normal. No stridor. No respiratory distress. He has no wheezes. He has no rales. He exhibits no tenderness.  Abdominal: Soft. He exhibits no distension and no mass. There is no tenderness. There is no rebound and no guarding.  Musculoskeletal: Normal range of motion. He exhibits no edema or tenderness.  No calf asymmetry, superficial collaterals, palpable cords, edema, Homans sign negative bilaterally.    Neurological: He is alert and oriented to person, place, and time.  Skin: Skin is  warm. He is not diaphoretic.  Psychiatric: He has a normal mood and affect.  Nursing note and vitals reviewed.   ED Course  Procedures (including critical care time) Labs Review Labs Reviewed  CBC - Abnormal; Notable for the following:    WBC 3.5 (*)    RBC 4.15 (*)    Hemoglobin 12.7 (*)    HCT 36.4 (*)    Platelets 134 (*)    All other components within normal limits  BASIC METABOLIC PANEL - Abnormal; Notable for the following:    Glucose, Bld 435 (*)    Calcium 8.7 (*)    All other components within normal limits  CBG MONITORING, ED - Abnormal; Notable for the following:    Glucose-Capillary 380 (*)    All other components within normal limits  I-STAT TROPOININ, ED    Imaging Review Dg Chest Port 1 View  01/09/2015   CLINICAL DATA:  Shortness of breath and chest pain  EXAM: PORTABLE CHEST - 1 VIEW  COMPARISON:  10/11/2014  FINDINGS: The heart size and mediastinal contours are within normal limits. Both lungs are clear. The visualized skeletal structures are unremarkable.  IMPRESSION: No active disease.   Electronically Signed   By: Inez Catalina M.D.   On: 01/09/2015 14:31     EKG Interpretation   Date/Time:  Thursday January 09 2015 13:57:34 EDT Ventricular Rate:  97 PR Interval:  161 QRS Duration: 98 QT Interval:  351 QTC Calculation: 446 R Axis:   67 Text Interpretation:  Sinus rhythm ST elevation suggests acute  pericarditis Baseline wander in lead(s) III aVL no significant change  since Mar 2016 Confirmed by Regenia Skeeter  MD, SCOTT [id number]) on 01/09/2015  2:00:07 PM      MDM   Final diagnoses:  Pain  Hyperglycemia without ketosis  Atypical chest pain    Filed Vitals:   01/09/15 1415 01/09/15 1430 01/09/15 1445 01/09/15 1500  BP: 111/72 107/68 103/72 115/78  Pulse: 93 96 92 91  Temp:      TempSrc:      Resp:  18  15  Height:  Weight:      SpO2: 100% 100% 100% 99%    Medications  sodium chloride 0.9 % bolus 1,000 mL (1,000 mLs Intravenous New  Bag/Given 01/09/15 1514)  sodium chloride 0.9 % bolus 1,000 mL (0 mLs Intravenous Stopped 01/09/15 1519)  insulin aspart (novoLOG) injection 5 Units (5 Units Intravenous Given 01/09/15 1515)    Wayne [name] is a pleasant 39 y.o. male presenting with hyperglycemia, multiple episodes of nausea and vomiting in addition to chest pain, his chest pain is atypical, fleeting and spontaneously resolving. He is low risk by heart score. His EKG is nonischemic with no arrhythmia, troponin is negative. Patient has a history of depression is actively being treated for such. He is noncompliant with his insulin because of issues with obtaining medication secondary to issues with insurance. I'll consult with social work and patient is eligible to have a one-month prescription filled. Patient will be given referral to the health and wellness Center. Transitional nurses on the way to the ED to see the patient and set up an appointment at the wellness Center. Patient will be given prescription for Lantus, Humalog, Symbicort and albuterol.  Patient will be by mouth challenged, encouraged to remain compliant with his insulin and to follow closely with wellness Center.       Monico Blitz, PA-C 01/10/15 1003  Debby Freiberg, MD 01/10/15 [phone]

## 2015-01-09 NOTE — Progress Notes (Signed)
Spoke with pt who states he has not taken Insulin because his Insurance has been cancelled. Provided MATCH letter and specific details which included where he could get meds filled and that he would need to fill Rx within 7 days. Pt verbalized understanding of above and that he could not be matched again within the next 12 months. LM with [facility] Transitional Nurse who will make contact with pt to assist with continued care.

## 2015-01-09 NOTE — Hospital Discharge Follow-Up (Signed)
Transitional Care Clinic Care Coordination Note:  ED visit: 01/09/15   Patient contact: [phone] (home); [phone] (cell) Emergency contact(s): Stanton Kidney ([phone] (home); [phone] (cell)  This Case Manager reviewed patient's EMR and determined patient would benefit from post-discharge medical management and chronic care management services through the [facility]. Patient has a history of uncontrolled type 2 diabetes mellitus with peripheral neuropathy, hypertension, hyperlipidemia, depression, OSA. This Case Manager met with patient to discuss the services and medical management that can be provided at the [facility]. Patient verbalized understanding and agreed to receive post-discharge care at the [facility].   Patient scheduled for Transitional Care appointment on 01/15/15 at 1530 with Dr. Jarold Song.  Clinic information and appointment time provided to patient. Appointment information also placed on AVS.  Assessment:       Home Environment: Patient lives with his mother and sister in a private residence.       Support System: sister, mother       Level of functioning: Patient independent with daily activities, but he indicates he has difficulty with daily activities due to pain from neuropathy.  He indicates he has difficulty getting to bathroom at times due to neuropathy pain. He indicates he has a bedside commode. Discussed having bedside commode close to him in case he is unable to get to bathroom due to pain. Also discussed setting up a nearby station with food and water if alone so he can have needed hydration and nutrition. Patient verbalized understanding.       Home DME: home CPAP; however, patient indicates he does not use.  Patient also has a bedside commode at home in case needed.       Home care services: Patient does not have any home health services, but he does have an assigned ACT Team.       Transportation:  Patient's sister  provides transportation for patient as needed, such as to physician appointments.        Food/Nutrition: Patient indicates his sister cooks meals and shops for all food. Patient indicates he has access to food he needs and does not have Physicist, medical.        Medications: Patient indicates he recently lost his health insurance so he has been unable to afford all of his medications. He indicates he does not have his insulin, and he also indicates his glucometer is broken. This was discussed with Fuller Mandril, RN CM.  She has provided patient with [facility] letter so he can get needed discharge medications.  Also, informed her of patient's need for prescriptions for glucometer (True Metrix), strips, and lancets so patient can obtain needed diabetes supplies at [facility] and [facility]. This was discussed with patient who verbalized understanding. Thoroughly discussed the pharmacy resources available at [facility] and [facility], and patient verbalized understanding.        Identified Barriers: uninsured, no PCP, difficulty affording medications        PCP: Dr. Donnie Coffin with [facility]; however, patient indicates he recently lost his health insurance and can no longer afford the out-of-pocket cost of appointments.  Patient interested in establishing care with PCP at [facility] after [facility] management and follow-up.   Patient Education: Thoroughly discussed pharmacy services and resources available at [facility] and discussed importance of using MATCH to obtain needed discharge medications. Also discussed medical management provided by [facility] and available resources. Discussed importance of medication  compliance, diabetes monitoring; patient verbalized understanding.              Arranged services:        Services communicated to Fuller Mandril, RN CM at [facility] ED.

## 2015-01-09 NOTE — ED Notes (Signed)
Pt given sprite for PO challenge

## 2015-01-09 NOTE — Progress Notes (Signed)
CSW spoke to pt re: his depression/life stressors.  Pt lives with his older sister and mother, an arrangement he refers to as being ok.  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  CSW offered praise/support for his commitment to his job and the people with whom he works.  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  Pt encouraged to seek employment in another field and options were discussed.  Pt feels as if he needs to be on a new path in his life and explore alternative care options.  Supportive counseling and encouragement provided.  CSW provided pt with the [facility] pamphlet on peer-led support groups.  Pt appreciative of CSW visit and support.  Pt denies SI/HI.

## 2015-01-09 NOTE — Discharge Instructions (Signed)
Do not hesitate to return to the Emergency Department for any new, worsening or concerning symptoms.  ° °If you do not have a primary care doctor you can establish one at the  ° °[facility]: °[address] °[place] [address] °[phone] ° °After you establish care. Let them know you were seen in the emergency room. They must obtain records for further management.  ° ° °

## 2015-01-09 NOTE — ED Notes (Addendum)
Per EMS: pt from home for eval of n/v x4 days, pt initial cbg 474 and pt initially hypotensive at 92/60. While en route pt started to c/o chest pain and was given 324 mg of aspirin, no nitro due to pt BP. EMS noted that pt EKG showed minimal ST elevation in lateral leads, copy sent and seen by EDP. Pt arrives and states he has not been taken his medications due to not wanting to and being depressed. nad noted. Pt denies any SI at this time.

## 2015-01-09 NOTE — ED Notes (Signed)
CBG = 380  RN Danielle informed of results.

## 2015-01-09 NOTE — ED Notes (Signed)
PT placed in gown and in bed. Pt monitored by pulse ox, bp cuff, and 12-lead. 

## 2015-01-10 ENCOUNTER — Telehealth: Payer: Self-pay

## 2015-01-10 NOTE — Telephone Encounter (Signed)
Transitional Care Clinic Post-discharge Follow-Up Phone Call:  ED visit: 01/09/15 Principal Discharge Diagnosis(es): Uncontrolled diabetes mellitus Call Completed: Yes                    With Whom: Patient    Please check all that apply:  X  Patient is knowledgeable of his/her condition(s) and/or treatment. X  Patient is caring for self at home.  ? Patient is receiving assist at home from family and/or caregiver. Family and/or caregiver is knowledgeable of patient's condition(s) and/or treatment. ? Patient is receiving home health services. If so, name of agency.     Medication Reconciliation: X Patient obtained all discharge medications.    Activities of Daily Living:  X  Independent though patient does have some neuropathy pain that causes some difficulty doing daily activities. ? Needs assist ? [facility]   Community resources in place for patient:  ? None  ? Home Health/Home DME ? Assisted Living X  ACT Team-ACT Team present during phone call with patient.  They indicate patient's psychiatric medications come in a pre-packaged pill box from [facility].  Patient sees Psychiatrist and ACT Team follows patient closely and provides mental health support.          Patient Education: Patient reminded of appointment on 01/15/15 at 1530 with Dr. Venetia Night. Patient verbalized understanding, and patient indicates his sister will provide transportation to appointment. Patient indicates he obtained discharge medications, but he has not yet picked up glucometer, strips, and lancets from [facility].  Informed patient to bring prescriptions to pharmacy today to obtain needed diabetes supplies and begin checking blood glucose before meals and before bed.  Also asked patient to keep a log of his blood glucose readings and bring log to appointment for Dr. Venetia Night to determine if patient on correct insulin dosage. Patient also asked to bring medications to  appointment on 01/15/15. Patient verbalized understanding.

## 2015-01-15 ENCOUNTER — Encounter: Payer: Self-pay | Admitting: Family Medicine

## 2015-01-15 ENCOUNTER — Ambulatory Visit: Payer: BLUE CROSS/BLUE SHIELD | Attending: Family Medicine | Admitting: Family Medicine

## 2015-01-15 VITALS — BP 107/74 | HR 102 | Temp 98.6°F | Resp 18 | Ht 73.0 in | Wt 159.6 lb

## 2015-01-15 DIAGNOSIS — M545 Low back pain, unspecified: Secondary | ICD-10-CM

## 2015-01-15 DIAGNOSIS — F332 Major depressive disorder, recurrent severe without psychotic features: Secondary | ICD-10-CM | POA: Diagnosis not present

## 2015-01-15 DIAGNOSIS — G629 Polyneuropathy, unspecified: Secondary | ICD-10-CM | POA: Diagnosis not present

## 2015-01-15 DIAGNOSIS — I10 Essential (primary) hypertension: Secondary | ICD-10-CM | POA: Diagnosis not present

## 2015-01-15 DIAGNOSIS — L84 Corns and callosities: Secondary | ICD-10-CM | POA: Insufficient documentation

## 2015-01-15 DIAGNOSIS — E0810 Diabetes mellitus due to underlying condition with ketoacidosis without coma: Secondary | ICD-10-CM | POA: Diagnosis not present

## 2015-01-15 LAB — POCT URINALYSIS DIPSTICK
Bilirubin, UA: NEGATIVE
Blood, UA: NEGATIVE
Glucose, UA: 500
Ketones, UA: NEGATIVE
LEUKOCYTES UA: NEGATIVE
Nitrite, UA: NEGATIVE
PH UA: 5.5
Protein, UA: NEGATIVE
Spec Grav, UA: <=1.005
UROBILINOGEN UA: 0.2

## 2015-01-15 LAB — POCT GLYCOSYLATED HEMOGLOBIN (HGB A1C): Hemoglobin A1C: 12.2

## 2015-01-15 LAB — GLUCOSE, POCT (MANUAL RESULT ENTRY): POC GLUCOSE: 497 mg/dL — AB (ref 70–99)

## 2015-01-15 MED ORDER — ESOMEPRAZOLE MAGNESIUM 40 MG PO CPDR: 40.0000 mg | DELAYED_RELEASE_CAPSULE | Freq: Every day | ORAL | Status: DC

## 2015-01-15 MED ORDER — METHOCARBAMOL 500 MG PO TABS: 500.0000 mg | ORAL_TABLET | Freq: Three times a day (TID) | ORAL | Status: DC | PRN

## 2015-01-15 MED ORDER — EZETIMIBE 10 MG PO TABS: 10.0000 mg | ORAL_TABLET | Freq: Every day | ORAL | Status: DC

## 2015-01-15 MED ORDER — INSULIN ASPART 100 UNIT/ML ~~LOC~~ SOLN
10.0000 [IU] | Freq: Once | SUBCUTANEOUS | Status: AC
  Administered 2015-01-15: 10 [IU] via SUBCUTANEOUS

## 2015-01-15 MED ORDER — SAXAGLIPTIN HCL 5 MG PO TABS: 5.0000 mg | ORAL_TABLET | Freq: Every day | ORAL | Status: DC

## 2015-01-15 MED ORDER — METFORMIN HCL 1000 MG PO TABS: 1000.0000 mg | ORAL_TABLET | Freq: Two times a day (BID) | ORAL | Status: DC

## 2015-01-15 MED ORDER — GABAPENTIN 300 MG PO CAPS: 600.0000 mg | ORAL_CAPSULE | Freq: Three times a day (TID) | ORAL | Status: DC

## 2015-01-15 MED ORDER — LOSARTAN POTASSIUM 25 MG PO TABS: 25.0000 mg | ORAL_TABLET | Freq: Every day | ORAL | Status: DC

## 2015-01-15 NOTE — Patient Instructions (Signed)
Diabetes and Exercise Exercising regularly is important. It is not just about losing weight. It has many health benefits, such as:  Improving your overall fitness, flexibility, and endurance.  Increasing your bone density.  Helping with weight control.  Decreasing your body fat.  Increasing your muscle strength.  Reducing stress and tension.  Improving your overall health. People with diabetes who exercise gain additional benefits because exercise:  Reduces appetite.  Improves the body's use of blood sugar (glucose).  Helps lower or control blood glucose.  Decreases blood pressure.  Helps control blood lipids (such as cholesterol and triglycerides).  Improves the body's use of the hormone insulin by:  Increasing the body's insulin sensitivity.  Reducing the body's insulin needs.  Decreases the risk for heart disease because exercising:  Lowers cholesterol and triglycerides levels.  Increases the levels of good cholesterol (such as high-density lipoproteins [HDL]) in the body.  Lowers blood glucose levels. YOUR ACTIVITY PLAN  Choose an activity that you enjoy and set realistic goals. Your health care provider or diabetes educator can help you make an activity plan that works for you. Exercise regularly as directed by your health care provider. This includes:  Performing resistance training twice a week such as push-ups, sit-ups, lifting weights, or using resistance bands.  Performing 150 minutes of cardio exercises each week such as walking, running, or playing sports.  Staying active and spending no more than 90 minutes at one time being inactive. Even short bursts of exercise are good for you. Three 10-minute sessions spread throughout the day are just as beneficial as a single 30-minute session. Some exercise ideas include:  Taking the dog for a walk.  Taking the stairs instead of the elevator.  Dancing to your favorite song.  Doing an exercise  video.  Doing your favorite exercise with a friend. RECOMMENDATIONS FOR EXERCISING WITH TYPE 1 OR TYPE 2 DIABETES   Check your blood glucose before exercising. If blood glucose levels are greater than 240 mg/dL, check for urine ketones. Do not exercise if ketones are present.  Avoid injecting insulin into areas of the body that are going to be exercised. For example, avoid injecting insulin into:  The arms when playing tennis.  The legs when jogging.  Keep a record of:  Food intake before and after you exercise.  Expected peak times of insulin action.  Blood glucose levels before and after you exercise.  The type and amount of exercise you have done.  Review your records with your health care provider. Your health care provider will help you to develop guidelines for adjusting food intake and insulin amounts before and after exercising.  If you take insulin or oral hypoglycemic agents, watch for signs and symptoms of hypoglycemia. They include:  Dizziness.  Shaking.  Sweating.  Chills.  Confusion.  Drink plenty of water while you exercise to prevent dehydration or heat stroke. Body water is lost during exercise and must be replaced.  Talk to your health care provider before starting an exercise program to make sure it is safe for you. Remember, almost any type of activity is better than none. Document Released: 10/02/2003 Document Revised: 11/26/2013 Document Reviewed: 12/19/2012 ExitCare Patient Information 2015 [employer]. This information is not intended to replace advice given to you by your health care provider. Make sure you discuss any questions you have with your health care provider.  

## 2015-01-15 NOTE — Progress Notes (Signed)
Quick Note:  Labs addressed at office as well as medications and patient was made aware. ______ 

## 2015-01-15 NOTE — Progress Notes (Addendum)
Subjective:    Patient ID: Wayne [name], male    DOB: June 05, 1976, 39 y.o.   MRN: [id number]  [facility]  Date of Telephone Encounter: 01/10/15  PCP: None HPI  Wayne [name] is a 39 year old male with a history of Asthma, Type 1 DM, Asthma, Depression, IV drug abuse, Hypertension who presented to the ED with nausea and vomiting.  His therapist from the ACT team was visiting and had called EMS because he was having chest pains.When EMS arrived patient developed shortness of breath and pressure-like left-sided nonradiating chest pain. He stated at its worst the pain was at 8 out of 10, he states that this resolved spontaneously within about 15 minutes. He was given a full dose aspirin by EMS but nitroglycerin was held because his systolic was soft in the 90. EKG revealed normal sinus rhythm and changes suggestive of pericarditis. He received IV fluids, insulin and chest pain was thought to be atypical and social work was contacted for assistance with his medications which he had been non compliant with due to finances.  He has had multiple ED visits in the last six months (over five) for multiple issues ranging from hyperglycemia due to non compliance to foot issues to Depression.   Previously followed by Dr Donnie Coffin of [place] but lost his insurance C/o pain in feet , hands which is typical of his neuropathy. He also had back pain which started yesterday, no h/o heavy lifting.  Gets so depressed and sometimes does not feel like taking his medications Has the ACT team come to his home and he gets therapy twice /week.   Lives with his mom and sister; mom is major care giver and recently had back surgery. He does not have PCS services.  Past Medical History  Diagnosis Date  . Depression   . Diabetes mellitus   . Hyperlipidemia   . HTN (hypertension)   . Chronic headaches   . Osteoarthritis   . Kidney stones   . Polysubstance abuse     cocaine, marijuana-quit summer 2010  .  Tobacco abuse   . Asthma   . Chest pain   . Lumbar disc disease   . GERD (gastroesophageal reflux disease)   . OSA (obstructive sleep apnea)   . Peripheral neuropathy 02/01/2011  . Bipolar 1 disorder   . Ingrown nail 11/12/2013    Past Surgical History  Procedure Laterality Date  . Knee surgery    . Bunionectomy  06-2009    History   Social History  . Marital Status: Single    Spouse Name: N/A  . Number of Children: 0  . Years of Education: N/A   Occupational History  . student and landscape    Social History Main Topics  . Smoking status: Former Smoker -- 0.20 packs/day for 17 years    Types: Cigarettes    Quit date: 02/24/2011  . Smokeless tobacco: Former Systems developer  . Alcohol Use: No     Comment: socially  . Drug Use: No     Comment: former cocaine and use  . Sexual Activity: Not on file   Other Topics Concern  . Not on file   Social History Narrative    Family History  Problem Relation Age of Onset  . Heart disease Mother   . Diabetes Father   . Hyperlipidemia Father   . Emphysema Maternal Uncle   . Asthma Sister   . Asthma Mother   . Stroke Mother   .  Hypertension Father   . Alzheimer's disease Maternal Grandmother   . Depression Father   . Breast cancer Maternal Aunt   . COPD Mother     maternal un    Allergies  Allergen Reactions  . Iodine     rash  . Lipitor [Atorvastatin]     All statins. Muscle weakness.   . Nystatin   . Statins Other (See Comments)    Muscle weakness, stomach cramps    Current Outpatient Prescriptions on File Prior to Visit  Medication Sig Dispense Refill  . albuterol (PROVENTIL HFA;VENTOLIN HFA) 108 (90 BASE) MCG/ACT inhaler Inhale 2 puffs into the lungs every 6 (six) hours as needed for wheezing (wheezing). For shortness of breath 1 Inhaler 2  . ARIPiprazole (ABILIFY) 20 MG tablet Take 10 mg by mouth 2 (two) times daily.    . Blood Glucose Monitoring Suppl (TRUE METRIX METER) W/DEVICE KIT 1 Device by Does not apply  route as needed. 1 kit 0  . budesonide-formoterol (SYMBICORT) 80-4.5 MCG/ACT inhaler Inhale 2 puffs into the lungs 2 (two) times daily. 1 Inhaler 12  . buPROPion (WELLBUTRIN XL) 300 MG 24 hr tablet Take 1 tablet (300 mg total) by mouth daily. 30 tablet 0  . fluticasone (FLONASE) 50 MCG/ACT nasal spray Place 2 sprays into both nostrils daily.  2  . glucose blood test strip Use as instructed 100 each 0  . glucose monitoring kit (FREESTYLE) monitoring kit 1 each by Does not apply route as needed for other. For daily blood glucose monitoring related to diabetes. 1 each 1  . insulin glargine (LANTUS) 100 UNIT/ML injection Inject 0.45 mLs (45 Units total) into the skin at bedtime. 10 mL 4  . insulin lispro (HUMALOG) 100 UNIT/ML injection Inject 0.1 mLs (10 Units total) into the skin 3 (three) times daily before meals. 10 mL 4  . Lancet Devices MISC Use PRN for blood glucose monitoring 100 each 0  . loratadine (CLARITIN) 10 MG tablet Take 1 tablet (10 mg total) by mouth daily.    . meloxicam (MOBIC) 15 MG tablet Take 15 mg by mouth daily.    . sertraline (ZOLOFT) 100 MG tablet Take 1 tablet (100 mg total) by mouth at bedtime. 30 tablet 0  . zolpidem (AMBIEN) 5 MG tablet Take 1 tablet (5 mg total) by mouth at bedtime as needed for sleep. 30 tablet 0  . ARIPiprazole (ABILIFY) 10 MG tablet Take 1 tablet (10 mg total) by mouth 2 (two) times daily. (Patient not taking: Reported on 01/15/2015) 60 tablet 0  . aspirin EC 81 MG tablet Take 1 tablet (81 mg total) by mouth daily. (Patient not taking: Reported on 01/06/2015)    . dextromethorphan-guaiFENesin (MUCINEX DM) 30-600 MG per 12 hr tablet Take 1 tablet by mouth 2 (two) times daily as needed for cough (cough).    [name] ibuprofen (ADVIL,MOTRIN) 200 MG tablet Take 600 mg by mouth every 8 (eight) hours as needed for mild pain or moderate pain.     [name] insulin detemir (LEVEMIR) 100 UNIT/ML injection Inject 45 Units into the skin at bedtime.    . Multiple Vitamin  (MULTIVITAMIN WITH MINERALS) TABS tablet Take 1 tablet by mouth daily. (Patient not taking: Reported on 01/06/2015)    . traMADol (ULTRAM) 50 MG tablet Take 1 tablet by mouth every 6 (six) hours as needed. pain  0   No current facility-administered medications on file prior to visit.     Review of Systems  Constitutional: Negative for activity  change and appetite change.  HENT: Negative for sinus pressure and sore throat.   Eyes: Negative for visual disturbance.  Respiratory: Negative for chest tightness and shortness of breath.   Cardiovascular: Negative for chest pain and palpitations.  Gastrointestinal: Negative for abdominal pain and abdominal distention.  Endocrine: Negative for cold intolerance, heat intolerance and polyphagia.  Genitourinary: Negative for dysuria, frequency and difficulty urinating.  Musculoskeletal: Positive for back pain.       See hpi  Skin: Negative for color change.  Neurological: Positive for numbness. Negative for dizziness, tremors and weakness.  Psychiatric/Behavioral: Positive for dysphoric mood. Negative for suicidal ideas and behavioral problems.         Objective: Filed Vitals:   01/15/15 1559 01/15/15 1605  BP:  107/74  Pulse:  102  Temp:  98.6 F (37 C)  TempSrc:  Oral  Resp:  18  Height: '6\' 1"'  (1.854 m)   Weight: 159 lb 9.6 oz (72.394 kg)   SpO2:  98%      Physical Exam  Constitutional: He is oriented to person, place, and time. He appears well-developed and well-nourished.  HENT:  Head: Normocephalic and atraumatic.  Right Ear: External ear normal.  Left Ear: External ear normal.  Eyes: Conjunctivae and EOM are normal. Pupils are equal, round, and reactive to light.  Neck: Normal range of motion. Neck supple. No tracheal deviation present.  Cardiovascular: Regular rhythm and normal heart sounds.  Tachycardia present.   No murmur heard. Pulmonary/Chest: Effort normal and breath sounds normal. No respiratory distress. He has no  wheezes. He exhibits no tenderness.  Abdominal: Soft. Bowel sounds are normal. He exhibits no mass. There is no tenderness.  Musculoskeletal: Normal range of motion. He exhibits no edema or tenderness.  Neurological: He is alert and oriented to person, place, and time.  Reduced sensation on dorsum of left great toe  Skin: Skin is warm and dry.  Callus on frontal aspect of right sole Left big toenail with partial blackish discoloration of nail.  Psychiatric: He has a normal mood and affect.            Assessment & Plan:  39 year old male with a history of Asthma, Type 1 DM, Asthma, Depression, IV drug abuse, Hypertension, multiple ED visits for hyperglycemia secondary to non compliance.  Type 2 diabetes mellitus: Uncontrolled with A1c of 12.2, CBG is 497 in the clinic, UA is negative for ketones. NovoLog 10 units given and CBG repeated. Poor glycemic control is secondary to noncompliance with medications. Refilled all meds  Diabetic Neuropathy: Uncontrolled on current dose of gabapentin. Increase Gabapentin to 646m tid  Hypertension: Blood pressure is on the low side and so I am discontinuing atenolol. Continue other antihypertensive  Depression: Uncontrolled with lack of motivation and this is also responsible for noncompliance with other medications for his medical conditions. Closely followed by the act team and does have a therapist come to his house twice a week. I have discussed with his caseworker that he would benefit from assistance with paperwork needed to obtain health [facility].  Callus of right foot: Will place on podiatrist schedule.  Back pain: Refilled Methocarbamol   Barriers to care include lack of health coverage, difficulty affording medications. His refills have been sent to the pharmacy on site who will work with him in obtaining his medicines. We will also work closely with ACT team to ensure he gets his  health coverage back  so we can set up skilled nursing and PCS services for him.

## 2015-01-15 NOTE — Progress Notes (Signed)
Patient here for [facility] hospital follow up for uncontrolled diabetes.  Patient states he just got his medication today and that he is feeling "tired."  Patient reports blood is coming out of left great toe, and left pinky finger, unsure what from.  Patient states his feet, legs, arm, hands, back are all in pain, Patient explains that back pain is sciatica, and the rest is neuropathic.  Mid back is most painful 6/10, constant, described as dull/sharp.  Patient requests refill for Methocarbamol for muscle spasms.   CBG 497 A1C 12.20

## 2015-01-17 NOTE — Addendum Note (Signed)
Addended by: Jaclyn Shaggy on: 01/17/2015 08:28 AM   Modules accepted: Level of Service

## 2015-01-20 ENCOUNTER — Telehealth: Payer: Self-pay

## 2015-01-20 ENCOUNTER — Other Ambulatory Visit: Payer: Self-pay

## 2015-01-20 NOTE — Telephone Encounter (Signed)
Called the patient at # [phone]  (home) to check on his status.  He was not at home and his mother said that she would have him return the call.  Call also made to his cell # [phone], a voice mail message as left  requesting a call back.

## 2015-01-21 ENCOUNTER — Telehealth: Payer: Self-pay

## 2015-01-21 NOTE — Telephone Encounter (Signed)
Called patient  at # [phone] ( c)  to check on his status.  Voice mail message left requesting a call back.   Called patient's home # [phone].  His sister, Rosey Batheresa, answered and said that he was not home and she would have him call back # [phone].

## 2015-01-22 ENCOUNTER — Telehealth: Payer: Self-pay

## 2015-01-22 NOTE — Telephone Encounter (Signed)
Called received from the patient stating that he is returning CM call.  He confirmed his appointment for tomorrow at 1030 and he said that his sister will bring him to the appointment.   He noted that he has a medication list and he said that he has been taking his medications as prescribed. This CM offered to review the medication list with him and he said that he would do that tomorrow because his phone was going to die, which it did.  Before the phone had died, he said that the he would bring his medications and list to his appointment tomorrow. He also said that he has not been checking his blood sugars. This CM asked if he needed instructions re. Using the meter and he said that he did not need any more instruction he just needs to use it.  This CM reinforced the importance of checking his blood sugars as instructed.  Again, he said that he knows he needs to do it.  Before the phone died, he also stated that he would call back if needed.

## 2015-01-22 NOTE — Telephone Encounter (Signed)
Called patient at # [phone] ( c) to check on his status and confirm his appointment for tomorrow. Voice mail message left requesting a call back.   Called patient's home # [phone], left a voice mail message requesting a call back to # [phone] or [phone].

## 2015-01-23 ENCOUNTER — Ambulatory Visit: Payer: Self-pay | Admitting: Family Medicine

## 2015-01-23 ENCOUNTER — Telehealth: Payer: Self-pay

## 2015-01-23 NOTE — Telephone Encounter (Signed)
Received call from Robyne PeersJane Brazeau, RN CM that patient contacted her and needed to reschedule his appointment today for another time.  Patient rescheduled for 02/03/15. Call placed to patient to determine if he could come in on 01/24/15 instead. Also wanted to determine if patient had begun checking blood glucose. Unable to reach patient at both home and mobile number.  Voicemail left at mobile number requesting return call.

## 2015-01-24 ENCOUNTER — Telehealth: Payer: Self-pay

## 2015-01-24 NOTE — Telephone Encounter (Signed)
Call placed again to patient to follow-up on condition and determine if he wants to come to clinic today for follow-up. Patient indicates he does not want to come to clinic today but plans to keep his [facility] follow-up appointment on 02/03/15 at 1115.  Inquired if patient taking medications as prescribed, and he indicated he was taking them "on and off" because of his depression. Patient denies that he was planning or wanting to harm himself but indicates he was taking all medications sporadically including antidepressants. Discussed that patient will have undesirable side effects from taking psychiatric medications this way and that medications should be taken as prescribed. Patient is followed by ACT Team [phone]), and he indicates he thinks they will be coming to his house today. Patient indicated this Case  Manager could reach out to ACT Team to inform of our discussion. Also reinforced taking all medications as prescribed including insulin. Patient indicates he has a glucometer but checks his blood glucose "on and off."  Informed patient to check blood glucose before meals and at night and to keep a log of blood glucose readings.  Asked patient to bring blood glucose log to his appointment with Dr. Venetia NightAmao on 02/03/15. Patient verbalized understanding.  Call placed to ACT Team [phone]) and spoke with Jonny RuizJohn. Informed John that patient indicates he is taking all medications sporadically and informed him of our discussion about importance of medication compliance. John indicates ACT Team will visit patient today to deliver medications and an ACT Team nurse will meet with him to discuss medication compliance.

## 2015-01-25 DIAGNOSIS — IMO0002 Reserved for concepts with insufficient information to code with codable children: Secondary | ICD-10-CM | POA: Insufficient documentation

## 2015-01-25 DIAGNOSIS — R400 Somnolence: Secondary | ICD-10-CM | POA: Insufficient documentation

## 2015-01-25 DIAGNOSIS — E1365 Other specified diabetes mellitus with hyperglycemia: Secondary | ICD-10-CM | POA: Insufficient documentation

## 2015-01-25 DIAGNOSIS — F14129 Cocaine abuse with intoxication, unspecified: Secondary | ICD-10-CM | POA: Insufficient documentation

## 2015-01-28 ENCOUNTER — Telehealth: Payer: Self-pay

## 2015-01-28 NOTE — Telephone Encounter (Signed)
Called the patient on his cell # to check on his status. Voice mail message left requesting a return call to # [phone] or # [phone].

## 2015-01-31 ENCOUNTER — Telehealth: Payer: Self-pay

## 2015-01-31 NOTE — Telephone Encounter (Signed)
Call placed to patient to check on his status and to remind patient of appointment on 02/03/15 at 1115 with Dr. Venetia NightAmao.  Unable to reach patient on mobile; when number called there was a message that said: "person you are trying to reach is not accepting calls at this time."  Also placed call to patient's home number [phone]); left voicemail requesting return call.

## 2015-02-03 ENCOUNTER — Encounter: Payer: Self-pay | Admitting: Family Medicine

## 2015-02-03 ENCOUNTER — Telehealth: Payer: Self-pay

## 2015-02-03 ENCOUNTER — Ambulatory Visit: Payer: Self-pay | Attending: Family Medicine | Admitting: Family Medicine

## 2015-02-03 VITALS — BP 111/72 | HR 104 | Temp 97.8°F | Resp 18 | Ht 71.0 in | Wt 156.2 lb

## 2015-02-03 DIAGNOSIS — G629 Polyneuropathy, unspecified: Secondary | ICD-10-CM

## 2015-02-03 DIAGNOSIS — I10 Essential (primary) hypertension: Secondary | ICD-10-CM

## 2015-02-03 DIAGNOSIS — F332 Major depressive disorder, recurrent severe without psychotic features: Secondary | ICD-10-CM

## 2015-02-03 DIAGNOSIS — R739 Hyperglycemia, unspecified: Secondary | ICD-10-CM

## 2015-02-03 DIAGNOSIS — E088 Diabetes mellitus due to underlying condition with unspecified complications: Secondary | ICD-10-CM

## 2015-02-03 DIAGNOSIS — L600 Ingrowing nail: Secondary | ICD-10-CM

## 2015-02-03 LAB — GLUCOSE, POCT (MANUAL RESULT ENTRY)
POC GLUCOSE: 595 mg/dL — AB (ref 70–99)
POC GLUCOSE: 500 mg/dL — AB (ref 70–99)
POC GLUCOSE: 460 mg/dL — AB (ref 70–99)

## 2015-02-03 LAB — POCT URINALYSIS DIPSTICK
BILIRUBIN UA: NEGATIVE
Blood, UA: NEGATIVE
GLUCOSE UA: 500
Leukocytes, UA: NEGATIVE
NITRITE UA: NEGATIVE
PH UA: 5.5
PROTEIN UA: NEGATIVE
UROBILINOGEN UA: 0.2

## 2015-02-03 MED ORDER — INSULIN ASPART 100 UNIT/ML ~~LOC~~ SOLN
15.0000 [IU] | Freq: Once | SUBCUTANEOUS | Status: AC
  Administered 2015-02-03: 15 [IU] via SUBCUTANEOUS

## 2015-02-03 MED ORDER — SODIUM CHLORIDE 0.9 % IV SOLN
Freq: Once | INTRAVENOUS | Status: AC
  Administered 2015-02-03: 1000 mL via INTRAVENOUS

## 2015-02-03 MED ORDER — INSULIN LISPRO 100 UNIT/ML ~~LOC~~ SOLN: 10.0000 [IU] | Freq: Three times a day (TID) | SUBCUTANEOUS | Status: DC

## 2015-02-03 MED ORDER — SOLIFENACIN SUCCINATE 5 MG PO TABS: 5.0000 mg | ORAL_TABLET | Freq: Every day | ORAL | Status: DC

## 2015-02-03 NOTE — Progress Notes (Signed)
Subjective:    Patient ID: Wayne [name], male    DOB: 23-Oct-1975, 39 y.o.   MRN: [id number]  [place]  Last ED visit: 01/09/15  Date of telephone Encounter: 01/10/15  Date of initial encounter : 01/15/15   HPI  [name] is a 39 year old male with a history of Asthma, Type 1 DM, Asthma, Depression, IV drug abuse, Hypertension who is being seen at the transitional care clinic for frequent ED visits secondary to frequent hyperglycemia with resulting nausea and vomiting and poor insight into medical conditions leading to non compliance.His severe depression has also played a huge role in compliance.  During his last ED visit his therapist from the ACT team was visiting and had called EMS because he was having chest pains.When EMS arrived patient developed shortness of breath and pressure-like left-sided nonradiating chest pain. He stated at its worst the pain was at 8 out of 10, he states that this resolved spontaneously within about 15 minutes. He was given a full dose aspirin by EMS but nitroglycerin was held because his systolic was soft in the 90. EKG revealed normal sinus rhythm and changes suggestive of pericarditis. He received IV fluids, insulin and chest pain was thought to be atypical and social work was contacted for assistance with his medications which he had been non compliant with due to finances.  He has had multiple ED visits in the last six months (over five) for multiple issues ranging from hyperglycemia due to non compliance to foot issues to Depression.   Followed by Dr Donnie Coffin of [facility]. Gets so depressed and sometimes does not feel like taking his medications Has the ACT team come to his home and he gets therapy twice /week. Lives with his mom and sister; mom is major care giver and recently had back surgery. He does not have PCS services and I had discussed this with his ACT team at his last visit as he would greatly benefit from this..   Interval  History:  Works at [employer] and just got off work and so he grabbed some [employer]: hamburger, shake, soda, fries and his blood sugar is 595 in the clinic. He tells me he is taking both Levemir and Lantus and both at 35 units each, daily and has not been taking his Novolog. Complains of pain in both toes at the site where he recently had his toenails removed at urgent care over the weekend.    Past Medical History  Diagnosis Date  . Depression   . Diabetes mellitus   . Hyperlipidemia   . HTN (hypertension)   . Chronic headaches   . Osteoarthritis   . Kidney stones   . Polysubstance abuse     cocaine, marijuana-quit summer 2010  . Tobacco abuse   . Asthma   . Chest pain   . Lumbar disc disease   . GERD (gastroesophageal reflux disease)   . OSA (obstructive sleep apnea)   . Peripheral neuropathy 02/01/2011  . Bipolar 1 disorder   . Ingrown nail 11/12/2013    Past Surgical History  Procedure Laterality Date  . Knee surgery    . Bunionectomy  06-2009    Family History  Problem Relation Age of Onset  . Heart disease Mother   . Diabetes Father   . Hyperlipidemia Father   . Emphysema Maternal Uncle   . Asthma Sister   . Asthma Mother   . Stroke Mother   . Hypertension Father   .  Alzheimer's disease Maternal Grandmother   . Depression Father   . Breast cancer Maternal Aunt   . COPD Mother     maternal un    History   Social History  . Marital Status: Single    Spouse Name: N/A  . Number of Children: 0  . Years of Education: N/A   Occupational History  . student and landscape    Social History Main Topics  . Smoking status: Current Some Day Smoker -- 0.20 packs/day for 17 years    Types: Cigarettes    Last Attempt to Quit: 02/24/2011  . Smokeless tobacco: Former Systems developer  . Alcohol Use: No     Comment: socially  . Drug Use: No     Comment: former cocaine and use  . Sexual Activity: Not on file   Other Topics Concern  . Not on file   Social  History Narrative    Allergies  Allergen Reactions  . Iodine     rash  . Lipitor [Atorvastatin]     All statins. Muscle weakness.   . Nystatin   . Statins Other (See Comments)    Muscle weakness, stomach cramps    Current Outpatient Prescriptions on File Prior to Visit  Medication Sig Dispense Refill  . albuterol (PROVENTIL HFA;VENTOLIN HFA) 108 (90 BASE) MCG/ACT inhaler Inhale 2 puffs into the lungs every 6 (six) hours as needed for wheezing (wheezing). For shortness of breath 1 Inhaler 2  . ARIPiprazole (ABILIFY) 10 MG tablet Take 1 tablet (10 mg total) by mouth 2 (two) times daily. 60 tablet 0  . ARIPiprazole (ABILIFY) 20 MG tablet Take 10 mg by mouth 2 (two) times daily.    . Blood Glucose Monitoring Suppl (TRUE METRIX METER) W/DEVICE KIT 1 Device by Does not apply route as needed. 1 kit 0  . budesonide-formoterol (SYMBICORT) 80-4.5 MCG/ACT inhaler Inhale 2 puffs into the lungs 2 (two) times daily. 1 Inhaler 12  . buPROPion (WELLBUTRIN XL) 300 MG 24 hr tablet Take 1 tablet (300 mg total) by mouth daily. 30 tablet 0  . dextromethorphan-guaiFENesin (MUCINEX DM) 30-600 MG per 12 hr tablet Take 1 tablet by mouth 2 (two) times daily as needed for cough (cough).    . esomeprazole (NEXIUM) 40 MG capsule Take 1 capsule (40 mg total) by mouth daily. 30 capsule 1  . ezetimibe (ZETIA) 10 MG tablet Take 1 tablet (10 mg total) by mouth daily. 30 tablet 1  . fluticasone (FLONASE) 50 MCG/ACT nasal spray Place 2 sprays into both nostrils daily.  2  . gabapentin (NEURONTIN) 300 MG capsule Take 2 capsules (600 mg total) by mouth 3 (three) times daily. 180 capsule 1  . glucose blood test strip Use as instructed 100 each 0  . glucose monitoring kit (FREESTYLE) monitoring kit 1 each by Does not apply route as needed for other. For daily blood glucose monitoring related to diabetes. 1 each 1  . insulin glargine (LANTUS) 100 UNIT/ML injection Inject 0.45 mLs (45 Units total) into the skin at bedtime. 10 mL  4  . Lancet Devices MISC Use PRN for blood glucose monitoring 100 each 0  . loratadine (CLARITIN) 10 MG tablet Take 1 tablet (10 mg total) by mouth daily.    [name] losartan (COZAAR) 25 MG tablet Take 1 tablet (25 mg total) by mouth daily. 30 tablet 2  . meloxicam (MOBIC) 15 MG tablet Take 15 mg by mouth daily.    . metFORMIN (GLUCOPHAGE) 1000 MG tablet Take 1 tablet (1,000  mg total) by mouth 2 (two) times daily with a meal. 60 tablet 2  . methocarbamol (ROBAXIN) 500 MG tablet Take 1 tablet (500 mg total) by mouth every 8 (eight) hours as needed for muscle spasms (muscle spasms). 90 tablet 1  . saxagliptin HCl (ONGLYZA) 5 MG TABS tablet Take 1 tablet (5 mg total) by mouth daily. 30 tablet 1  . sertraline (ZOLOFT) 100 MG tablet Take 1 tablet (100 mg total) by mouth at bedtime. 30 tablet 0  . traMADol (ULTRAM) 50 MG tablet Take 1 tablet by mouth every 6 (six) hours as needed. pain  0  . zolpidem (AMBIEN) 5 MG tablet Take 1 tablet (5 mg total) by mouth at bedtime as needed for sleep. 30 tablet 0  . aspirin EC 81 MG tablet Take 1 tablet (81 mg total) by mouth daily. (Patient not taking: Reported on 01/06/2015)    . ibuprofen (ADVIL,MOTRIN) 200 MG tablet Take 600 mg by mouth every 8 (eight) hours as needed for mild pain or moderate pain.     . Multiple Vitamin (MULTIVITAMIN WITH MINERALS) TABS tablet Take 1 tablet by mouth daily. (Patient not taking: Reported on 01/06/2015)     No current facility-administered medications on file prior to visit.    Review of Systems  Constitutional: Negative for activity change and appetite change.  HENT: Negative for sinus pressure and sore throat.   Eyes: Negative for visual disturbance.  Respiratory: Negative for chest tightness and shortness of breath.   Cardiovascular: Negative for chest pain and palpitations.  Gastrointestinal: Negative for abdominal pain and abdominal distention.  Endocrine: Negative for cold intolerance, heat intolerance and polyphagia.    Genitourinary: Negative for dysuria, frequency and difficulty urinating.       Urinary incontinence  Musculoskeletal: Negative for back pain, joint swelling and arthralgias.       Bilateral toe pain  Skin: Negative for color change.  Neurological: Negative for dizziness, tremors and weakness.  Psychiatric/Behavioral: Negative for suicidal ideas and behavioral problems.       Objective: Filed Vitals:   02/03/15 1104 02/03/15 1108  BP:  111/72  Pulse:  104  Temp:  97.8 F (36.6 C)  TempSrc:  Oral  Resp:  18  Height: '5\' 11"'  (1.803 m)   Weight: 156 lb 3.2 oz (70.852 kg)   SpO2:  98%      Physical Exam  Constitutional: He is oriented to person, place, and time. He appears well-developed and well-nourished.  HENT:  Head: Normocephalic and atraumatic.  Right Ear: External ear normal.  Left Ear: External ear normal.  Eyes: Conjunctivae and EOM are normal. Pupils are equal, round, and reactive to light.  Neck: Normal range of motion. Neck supple. No tracheal deviation present.  Cardiovascular: Regular rhythm and normal heart sounds.  Tachycardia present.   No murmur heard. Pulmonary/Chest: Effort normal and breath sounds normal. No respiratory distress. He has no wheezes. He exhibits no tenderness.  Abdominal: Soft. Bowel sounds are normal. He exhibits no mass. There is no tenderness.  Musculoskeletal: Normal range of motion. He exhibits no edema or tenderness.  Neurological: He is alert and oriented to person, place, and time.  Skin: Skin is warm and dry.  Bilateral toenail are absent, tenderness at site  Psychiatric: He has a normal mood and affect.     Lab Results  Component Value Date   HGBA1C 12.20 01/15/2015    CMP Latest Ref Rng 01/09/2015 01/06/2015 10/11/2014  Glucose 65 - 99 mg/dL 435(H) 534(H)  462(H)  BUN 6 - 20 mg/dL '10 17 20  ' Creatinine 0.61 - 1.24 mg/dL 1.02 0.95 1.25  Sodium 135 - 145 mmol/L 138 136 136  Potassium 3.5 - 5.1 mmol/L 4.2 4.5 4.3  Chloride 101 -  111 mmol/L 106 98(L) 98  CO2 22 - 32 mmol/L '24 26 29  ' Calcium 8.9 - 10.3 mg/dL 8.7(L) 9.5 10.3  Total Protein 6.5 - 8.1 g/dL - 7.8 -  Total Bilirubin 0.3 - 1.2 mg/dL - 0.7 -  Alkaline Phos 38 - 126 U/L - 90 -  AST 15 - 41 U/L - 23 -  ALT 17 - 63 U/L - 16(L) -           Assessment & Plan:  39 year old male with a history of Asthma, Type 1 DM, Asthma, Depression, IV drug abuse, Hypertension, multiple ED visits for hyperglycemia secondary to non compliance.  Type 2 diabetes mellitus: Uncontrolled with A1c of 12.2, CBG is 595 in the clinic, UA is positive for ketones. NovoLog 15 units given, placed on 568m bolus of IV NS,  and CBG repeated. Poor glycemic control is secondary to noncompliance with medications. Discontinued Levemir which he has been taking along with Lantus and I have advised him to continue Lantus at 45 units qhs and also Novolog 10 units tid with meals. This office called to confirm he has an appointment with his PCP - Dr DDonnie Coffin(on 02/06/15) who will follow up on his blood sugar logs and we will be faxing medical records to him as well.  Diabetic Neuropathy: Uncontrolled on current dose of gabapentin. Increased Gabapentin to 6038mtid at his last visit.  Hypertension: Blood pressure is on the low side but much better after atenolol was discontinued at his last visit. Continue other antihypertensive  Depression: Uncontrolled with lack of motivation and this is also responsible for noncompliance with other medications for his medical conditions. Closely followed by the act team and does have a therapist come to his house twice a week. I have discussed with his caseworker that [facility]; will be happy to complete paperwork.  Ingrown nail: S/p toenail removal Wet to dry dressing performed in clinic  Office notes faxed to PCP.

## 2015-02-03 NOTE — Progress Notes (Signed)
Patient here for follow up. Patient reports he feels fine today. Patient reports the urgent care took off two toenails, both big toes, on Saturday. Pain today located in both feet, both legs, and lower back. Pain rated at a 7, described as throbbing and sharp. Pain is constant.   CBG is 595. Patient ate at 10:30am. Patient had a mango shake, hanburger, french fries, and a diet coke from [employer]. Patient has taken levemir and metformin this morning. Patient reports he is not taking humalog.  Patient has taken his medications for today.

## 2015-02-03 NOTE — Patient Instructions (Addendum)
Patient has a follow up appointment with [facility] on Thursday, July 14th at 9 :00am.  He needs to arrive to the their office by 8:45 am.    Diabetes Mellitus and Food It is important for you to manage your blood sugar (glucose) level. Your blood glucose level can be greatly affected by what you eat. Eating healthier foods in the appropriate amounts throughout the day at about the same time each day will help you control your blood glucose level. It can also help slow or prevent worsening of your diabetes mellitus. Healthy eating may even help you improve the level of your blood pressure and reach or maintain a healthy weight.  HOW CAN FOOD AFFECT ME? Carbohydrates Carbohydrates affect your blood glucose level more than any other type of food. Your dietitian will help you determine how many carbohydrates to eat at each meal and teach you how to count carbohydrates. Counting carbohydrates is important to keep your blood glucose at a healthy level, especially if you are using insulin or taking certain medicines for diabetes mellitus. Alcohol Alcohol can cause sudden decreases in blood glucose (hypoglycemia), especially if you use insulin or take certain medicines for diabetes mellitus. Hypoglycemia can be a life-threatening condition. Symptoms of hypoglycemia (sleepiness, dizziness, and disorientation) are similar to symptoms of having too much alcohol.  If your health care provider has given you approval to drink alcohol, do so in moderation and use the following guidelines:  Women should not have more than one drink per day, and men should not have more than two drinks per day. One drink is equal to:  12 oz of beer.  5 oz of wine.  1 oz of hard liquor.  Do not drink on an empty stomach.  Keep yourself hydrated. Have water, diet soda, or unsweetened iced tea.  Regular soda, juice, and other mixers might contain a lot of carbohydrates and should be counted. WHAT FOODS  ARE NOT RECOMMENDED? As you make food choices, it is important to remember that all foods are not the same. Some foods have fewer nutrients per serving than other foods, even though they might have the same number of calories or carbohydrates. It is difficult to get your body what it needs when you eat foods with fewer nutrients. Examples of foods that you should avoid that are high in calories and carbohydrates but low in nutrients include:  Trans fats (most processed foods list trans fats on the Nutrition Facts label).  Regular soda.  Juice.  Candy.  Sweets, such as cake, pie, doughnuts, and cookies.  Fried foods. WHAT FOODS CAN I EAT? Have nutrient-rich foods, which will nourish your body and keep you healthy. The food you should eat also will depend on several factors, including:  The calories you need.  The medicines you take.  Your weight.  Your blood glucose level.  Your blood pressure level.  Your cholesterol level. You also should eat a variety of foods, including:  Protein, such as meat, poultry, fish, tofu, nuts, and seeds (lean animal proteins are best).  Fruits.  Vegetables.  Dairy products, such as milk, cheese, and yogurt (low fat is best).  Breads, grains, pasta, cereal, rice, and beans.  Fats such as olive oil, trans fat-free margarine, canola oil, avocado, and olives. DOES EVERYONE WITH DIABETES MELLITUS HAVE THE SAME MEAL PLAN? Because every person with diabetes mellitus is different, there is not one meal plan that works for everyone. It is very important that  you meet with a dietitian who will help you create a meal plan that is just right for you. Document Released: 04/08/2005 Document Revised: 07/17/2013 Document Reviewed: 06/08/2013 [facility] Patient Information 2015 [place], [place]. This information is not intended to replace advice given to you by your health care provider. Make sure you discuss any questions you have with your health care  provider.

## 2015-02-03 NOTE — Telephone Encounter (Signed)
Called the patient to confirm his appointment for today.  Called his cell # [phone] and the message stated that the person was not accepting calls at this time and to call back later, Call then placed to his home # [phone] and spoke to his mother who instructed CM to call the patient at # [phone].  Call then placed to that number and this CM was able to speak to the patient who said [facility] and his sister was going to provide transportation.

## 2015-02-04 NOTE — Progress Notes (Signed)
Quick Note:  Labs addressed at office, placed on IV NS, 15 units Novolog. ______

## 2015-02-05 ENCOUNTER — Other Ambulatory Visit: Payer: Self-pay | Admitting: Family Medicine

## 2015-02-05 MED ORDER — INSULIN ASPART 100 UNIT/ML ~~LOC~~ SOLN: 10.0000 [IU] | Freq: Three times a day (TID) | SUBCUTANEOUS | Status: DC

## 2015-02-14 ENCOUNTER — Encounter: Payer: Self-pay | Admitting: Gastroenterology

## 2015-02-15 ENCOUNTER — Emergency Department (HOSPITAL_BASED_OUTPATIENT_CLINIC_OR_DEPARTMENT_OTHER)
Admission: EM | Admit: 2015-02-15 | Discharge: 2015-02-16 | Disposition: A | Discharge status: Home / Self Care | Payer: BLUE CROSS/BLUE SHIELD | Attending: Emergency Medicine | Admitting: Emergency Medicine

## 2015-02-15 ENCOUNTER — Encounter (HOSPITAL_BASED_OUTPATIENT_CLINIC_OR_DEPARTMENT_OTHER): Payer: Self-pay | Admitting: Emergency Medicine

## 2015-02-15 DIAGNOSIS — R1010 Upper abdominal pain, unspecified: Secondary | ICD-10-CM | POA: Diagnosis not present

## 2015-02-15 DIAGNOSIS — Z720 Tobacco use: Secondary | ICD-10-CM | POA: Diagnosis not present

## 2015-02-15 DIAGNOSIS — E785 Hyperlipidemia, unspecified: Secondary | ICD-10-CM | POA: Diagnosis not present

## 2015-02-15 DIAGNOSIS — I10 Essential (primary) hypertension: Secondary | ICD-10-CM | POA: Insufficient documentation

## 2015-02-15 DIAGNOSIS — F319 Bipolar disorder, unspecified: Secondary | ICD-10-CM | POA: Insufficient documentation

## 2015-02-15 DIAGNOSIS — M1990 Unspecified osteoarthritis, unspecified site: Secondary | ICD-10-CM | POA: Diagnosis not present

## 2015-02-15 DIAGNOSIS — R739 Hyperglycemia, unspecified: Secondary | ICD-10-CM

## 2015-02-15 DIAGNOSIS — Z87442 Personal history of urinary calculi: Secondary | ICD-10-CM | POA: Diagnosis not present

## 2015-02-15 DIAGNOSIS — Z7982 Long term (current) use of aspirin: Secondary | ICD-10-CM | POA: Diagnosis not present

## 2015-02-15 DIAGNOSIS — Z7951 Long term (current) use of inhaled steroids: Secondary | ICD-10-CM | POA: Diagnosis not present

## 2015-02-15 DIAGNOSIS — G8929 Other chronic pain: Secondary | ICD-10-CM | POA: Insufficient documentation

## 2015-02-15 DIAGNOSIS — K219 Gastro-esophageal reflux disease without esophagitis: Secondary | ICD-10-CM | POA: Diagnosis not present

## 2015-02-15 DIAGNOSIS — J45909 Unspecified asthma, uncomplicated: Secondary | ICD-10-CM | POA: Diagnosis not present

## 2015-02-15 DIAGNOSIS — Z79899 Other long term (current) drug therapy: Secondary | ICD-10-CM | POA: Insufficient documentation

## 2015-02-15 DIAGNOSIS — H538 Other visual disturbances: Secondary | ICD-10-CM | POA: Insufficient documentation

## 2015-02-15 DIAGNOSIS — Z794 Long term (current) use of insulin: Secondary | ICD-10-CM | POA: Insufficient documentation

## 2015-02-15 DIAGNOSIS — E1165 Type 2 diabetes mellitus with hyperglycemia: Secondary | ICD-10-CM | POA: Diagnosis not present

## 2015-02-15 DIAGNOSIS — R197 Diarrhea, unspecified: Secondary | ICD-10-CM | POA: Diagnosis not present

## 2015-02-15 DIAGNOSIS — Z791 Long term (current) use of non-steroidal anti-inflammatories (NSAID): Secondary | ICD-10-CM | POA: Insufficient documentation

## 2015-02-15 DIAGNOSIS — Z872 Personal history of diseases of the skin and subcutaneous tissue: Secondary | ICD-10-CM | POA: Insufficient documentation

## 2015-02-15 DIAGNOSIS — R5383 Other fatigue: Secondary | ICD-10-CM | POA: Insufficient documentation

## 2015-02-15 DIAGNOSIS — M791 Myalgia: Secondary | ICD-10-CM | POA: Diagnosis not present

## 2015-02-15 DIAGNOSIS — G629 Polyneuropathy, unspecified: Secondary | ICD-10-CM | POA: Insufficient documentation

## 2015-02-15 LAB — URINE MICROSCOPIC-ADD ON

## 2015-02-15 LAB — COMPREHENSIVE METABOLIC PANEL
ALBUMIN: 3.8 g/dL (ref 3.5–5.0)
ALK PHOS: 88 U/L (ref 38–126)
ALT: 18 U/L (ref 17–63)
ANION GAP: 6 (ref 5–15)
AST: 22 U/L (ref 15–41)
BUN: 21 mg/dL — AB (ref 6–20)
CO2: 29 mmol/L (ref 22–32)
CREATININE: 1.16 mg/dL (ref 0.61–1.24)
Calcium: 9.2 mg/dL (ref 8.9–10.3)
Chloride: 97 mmol/L — ABNORMAL LOW (ref 101–111)
GFR calc Af Amer: >60 mL/min (ref >60)
GFR calc non Af Amer: >60 mL/min (ref >60)
GLUCOSE: 479 mg/dL — AB (ref 65–99)
Potassium: 4.5 mmol/L (ref 3.5–5.1)
Sodium: 132 mmol/L — ABNORMAL LOW (ref 135–145)
TOTAL PROTEIN: 7.5 g/dL (ref 6.5–8.1)
Total Bilirubin: 0.4 mg/dL (ref 0.3–1.2)

## 2015-02-15 LAB — URINALYSIS, ROUTINE W REFLEX MICROSCOPIC
Bilirubin Urine: NEGATIVE
HGB URINE DIPSTICK: NEGATIVE
Ketones, ur: NEGATIVE mg/dL
LEUKOCYTES UA: NEGATIVE
Nitrite: NEGATIVE
PH: 5.5 (ref 5.0–8.0)
PROTEIN: NEGATIVE mg/dL
Specific Gravity, Urine: 1.03 (ref 1.005–1.030)
Urobilinogen, UA: 0.2 mg/dL (ref 0.0–1.0)

## 2015-02-15 LAB — CBC
HCT: 40.2 % (ref 39.0–52.0)
Hemoglobin: 13.7 g/dL (ref 13.0–17.0)
MCH: 30.8 pg (ref 26.0–34.0)
MCHC: 34.1 g/dL (ref 30.0–36.0)
MCV: 90.3 fL (ref 78.0–100.0)
PLATELETS: 190 10*3/uL (ref 150–400)
RBC: 4.45 MIL/uL (ref 4.22–5.81)
RDW: 12.4 % (ref 11.5–15.5)
WBC: 4 10*3/uL (ref 4.0–10.5)

## 2015-02-15 LAB — CBG MONITORING, ED
GLUCOSE-CAPILLARY: 234 mg/dL — AB (ref 65–99)
Glucose-Capillary: 451 mg/dL — ABNORMAL HIGH (ref 65–99)
Glucose-Capillary: 349 mg/dL — ABNORMAL HIGH (ref 65–99)
Glucose-Capillary: 304 mg/dL — ABNORMAL HIGH (ref 65–99)
Glucose-Capillary: 173 mg/dL — ABNORMAL HIGH (ref 65–99)
Glucose-Capillary: 185 mg/dL — ABNORMAL HIGH (ref 65–99)

## 2015-02-15 MED ORDER — SODIUM CHLORIDE 0.9 % IV BOLUS (SEPSIS)
1000.0000 mL | Freq: Once | INTRAVENOUS | Status: AC
  Administered 2015-02-15: 1000 mL via INTRAVENOUS

## 2015-02-15 MED ORDER — INSULIN REGULAR HUMAN 100 UNIT/ML IJ SOLN
10.0000 [IU] | Freq: Once | INTRAMUSCULAR | Status: AC
  Administered 2015-02-15: 10 [IU] via SUBCUTANEOUS
  Filled 2015-02-15: qty 1

## 2015-02-15 MED ORDER — IBUPROFEN 800 MG PO TABS
800.0000 mg | ORAL_TABLET | Freq: Once | ORAL | Status: AC
  Administered 2015-02-15: 800 mg via ORAL
  Filled 2015-02-15: qty 1

## 2015-02-15 NOTE — ED Provider Notes (Signed)
CSN: [id number]     Arrival date & time 02/15/15  1647 History   This chart was scribed for Noemi Chapel, MD by Chester Holstein, ED Scribe. This patient was seen in room MH06/MH06 and the patient's care was started at 5:00 PM.    Chief Complaint  Patient presents with  . Hypotension  . Hyperglycemia     The history is provided by the patient and a relative. No language interpreter was used.   HPI Comments: Wayne [name] is a 39 y.o. male with PMHx of DM, HLD, HTN, polysubstance abuse, peripheral neuropathy, bipolar 1 disorder, lumbar disc disease, and asthma who presents to the Emergency Department complaining of hypoglycemia and hypotension with onset this morning. Pt states home care nurse came to give pt Abilify shot and pt's BP was 84/64 and CBG was 75. She did not give him the shot. Pt did not take his bcp medication either. Pt notes associated fatigue and watery diarrhea with onset yesterday. He also reports sharp pain in calf muscles and intermittent blurred vision which he reports is not uncommon for him due to PMHx. Pt ate french fries, baked beans, and pastrami for breakfast after vitals were taken. Family member reports pt looks a little jaundice. Pt is on Humalog, Lantus, and metformin.  He reports he was recently seen at [facility] and d/c on 02/13/15. Pt denies SOB, difficulty urinating, cough, fever, and hematochezia.   Past Medical History  Diagnosis Date  . Depression   . Diabetes mellitus   . Hyperlipidemia   . HTN (hypertension)   . Chronic headaches   . Osteoarthritis   . Kidney stones   . Polysubstance abuse     cocaine, marijuana-quit summer 2010  . Tobacco abuse   . Asthma   . Chest pain   . Lumbar disc disease   . GERD (gastroesophageal reflux disease)   . OSA (obstructive sleep apnea)   . Peripheral neuropathy 02/01/2011  . Bipolar 1 disorder   . Ingrown nail 11/12/2013   Past Surgical History  Procedure Laterality Date  . Knee surgery    .  Bunionectomy  06-2009   Family History  Problem Relation Age of Onset  . Heart disease Mother   . Diabetes Father   . Hyperlipidemia Father   . Emphysema Maternal Uncle   . Asthma Sister   . Asthma Mother   . Stroke Mother   . Hypertension Father   . Alzheimer's disease Maternal Grandmother   . Depression Father   . Breast cancer Maternal Aunt   . COPD Mother     maternal un   History  Substance Use Topics  . Smoking status: Current Some Day Smoker -- 0.20 packs/day for 17 years    Types: Cigarettes    Last Attempt to Quit: 02/24/2011  . Smokeless tobacco: Former Systems developer  . Alcohol Use: No     Comment: socially    Review of Systems  Constitutional: Positive for fatigue. Negative for fever.  Eyes: Positive for visual disturbance.  Respiratory: Negative for cough and shortness of breath.   Gastrointestinal: Positive for diarrhea. Negative for blood in stool.  Genitourinary: Negative for difficulty urinating.  Musculoskeletal: Positive for myalgias.  All other systems reviewed and are negative.     Allergies  Iodine; Lipitor; Nystatin; and Statins  Home Medications   Prior to Admission medications   Medication Sig Start Date End Date Taking? Authorizing Provider  albuterol (PROVENTIL HFA;VENTOLIN HFA) 108 (90 BASE) MCG/ACT inhaler  Inhale 2 puffs into the lungs every 6 (six) hours as needed for wheezing (wheezing). For shortness of breath 01/09/15   Elmyra Ricks Pisciotta, PA-C  ARIPiprazole (ABILIFY) 10 MG tablet Take 1 tablet (10 mg total) by mouth 2 (two) times daily. 08/09/14   Niel Hummer, NP  ARIPiprazole (ABILIFY) 20 MG tablet Take 10 mg by mouth 2 (two) times daily.    Historical Provider, MD  aspirin EC 81 MG tablet Take 1 tablet (81 mg total) by mouth daily. Patient not taking: Reported on 01/06/2015 08/09/14   Niel Hummer, NP  Blood Glucose Monitoring Suppl (TRUE METRIX METER) W/DEVICE KIT 1 Device by Does not apply route as needed. 01/09/15   Debby Freiberg, MD   budesonide-formoterol [facility]) 80-4.5 MCG/ACT inhaler Inhale 2 puffs into the lungs 2 (two) times daily. 01/09/15   Nicole Pisciotta, PA-C  buPROPion (WELLBUTRIN XL) 300 MG 24 hr tablet Take 1 tablet (300 mg total) by mouth daily. 08/09/14   Niel Hummer, NP  dextromethorphan-guaiFENesin [facility] DM) 30-600 MG per 12 hr tablet Take 1 tablet by mouth 2 (two) times daily as needed for cough (cough).    Historical Provider, MD  esomeprazole (NEXIUM) 40 MG capsule Take 1 capsule (40 mg total) by mouth daily. 01/15/15   Arnoldo Morale, MD  ezetimibe (ZETIA) 10 MG tablet Take 1 tablet (10 mg total) by mouth daily. 01/15/15   Arnoldo Morale, MD  fluticasone (FLONASE) 50 MCG/ACT nasal spray Place 2 sprays into both nostrils daily. 08/09/14   Niel Hummer, NP  gabapentin (NEURONTIN) 300 MG capsule Take 2 capsules (600 mg total) by mouth 3 (three) times daily. 01/15/15   Arnoldo Morale, MD  glucose blood test strip Use as instructed 01/09/15   Debby Freiberg, MD  glucose monitoring kit (FREESTYLE) monitoring kit 1 each by Does not apply route as needed for other. For daily blood glucose monitoring related to diabetes. 01/09/15   Nicole Pisciotta, PA-C  ibuprofen (ADVIL,MOTRIN) 200 MG tablet Take 600 mg by mouth every 8 (eight) hours as needed for mild pain or moderate pain.     Historical Provider, MD  insulin aspart (NOVOLOG) 100 UNIT/ML injection Inject 10 Units into the skin 3 (three) times daily before meals. 02/05/15   Arnoldo Morale, MD  insulin glargine (LANTUS) 100 UNIT/ML injection Inject 0.45 mLs (45 Units total) into the skin at bedtime. 01/09/15 03/29/16  Monico Blitz, PA-C  Lancet Devices MISC Use PRN for blood glucose monitoring 01/09/15   Debby Freiberg, MD  loratadine (CLARITIN) 10 MG tablet Take 1 tablet (10 mg total) by mouth daily. 08/09/14   Niel Hummer, NP  losartan (COZAAR) 25 MG tablet Take 1 tablet (25 mg total) by mouth daily. 01/15/15   Arnoldo Morale, MD  meloxicam (MOBIC) 15 MG tablet Take 15 mg  by mouth daily.    Historical Provider, MD  metFORMIN (GLUCOPHAGE) 1000 MG tablet Take 1 tablet (1,000 mg total) by mouth 2 (two) times daily with a meal. 01/15/15   Arnoldo Morale, MD  methocarbamol (ROBAXIN) 500 MG tablet Take 1 tablet (500 mg total) by mouth every 8 (eight) hours as needed for muscle spasms (muscle spasms). 01/15/15   Arnoldo Morale, MD  Multiple Vitamin (MULTIVITAMIN WITH MINERALS) TABS tablet Take 1 tablet by mouth daily. Patient not taking: Reported on 01/06/2015 08/09/14   Niel Hummer, NP  saxagliptin HCl (ONGLYZA) 5 MG TABS tablet Take 1 tablet (5 mg total) by mouth daily. 01/15/15   Arnoldo Morale, MD  sertraline (ZOLOFT) 100 MG tablet Take 1 tablet (100 mg total) by mouth at bedtime. 08/09/14   Niel Hummer, NP  solifenacin (VESICARE) 5 MG tablet Take 1 tablet (5 mg total) by mouth daily. 02/03/15   Arnoldo Morale, MD  traMADol (ULTRAM) 50 MG tablet Take 1 tablet by mouth every 6 (six) hours as needed. pain 11/18/14   Historical Provider, MD  zolpidem (AMBIEN) 5 MG tablet Take 1 tablet (5 mg total) by mouth at bedtime as needed for sleep. 08/09/14   Niel Hummer, NP   BP 105/61 mmHg  Pulse 87  Temp(Src) 98.8 F (37.1 C)  Resp 18  Wt 154 lb (69.854 kg)  SpO2 98% Physical Exam  Constitutional: He is oriented to person, place, and time. He appears well-developed and well-nourished. No distress.  HENT:  Head: Normocephalic and atraumatic.  Mouth/Throat: Oropharynx is clear and moist. No oropharyngeal exudate.  Eyes: Conjunctivae and EOM are normal. Pupils are equal, round, and reactive to light. Right eye exhibits no discharge. Left eye exhibits no discharge. No scleral icterus.  Mild jaundice  Neck: Normal range of motion. Neck supple. No JVD present. No thyromegaly present.  Cardiovascular: Normal rate, regular rhythm, normal heart sounds and intact distal pulses.  Exam reveals no gallop and no friction rub.   No murmur heard. Pulmonary/Chest: Effort normal and breath sounds  normal. No respiratory distress. He has no wheezes. He has no rales.  Abdominal: Soft. Bowel sounds are normal. He exhibits no distension and no mass. There is tenderness (mild right mid abdominal).  Musculoskeletal: Normal range of motion. He exhibits no edema or tenderness.  Lymphadenopathy:    He has no cervical adenopathy.  Neurological: He is alert and oriented to person, place, and time. Coordination normal.  Neurologic exam:  Speech clear, pupils equal round reactive to light, extraocular movements intact  Normal peripheral visual fields Cranial nerves III through XII normal including no facial droop Follows commands, moves all extremities x4, normal strength to bilateral upper and lower extremities at all major muscle groups including grip Sensation normal to light touch and pinprick Coordination intact, no limb ataxia, finger-nose-finger normal Rapid alternating movements normal No pronator drift Gait normal   Skin: Skin is warm and dry. No rash noted. He is not diaphoretic. No erythema.  Psychiatric: He has a normal mood and affect. His behavior is normal.  Nursing note and vitals reviewed.   ED Course  Procedures (including critical care time) DIAGNOSTIC STUDIES: Oxygen Saturation is 98% on room air, normal by my interpretation.    COORDINATION OF CARE: 5:05 PM Discussed treatment plan with patient at beside, the patient agrees with the plan and has no further questions at this time.   Labs Review Labs Reviewed  COMPREHENSIVE METABOLIC PANEL - Abnormal; Notable for the following:    Sodium 132 (*)    Chloride 97 (*)    Glucose, Bld 479 (*)    BUN 21 (*)    All other components within normal limits  URINALYSIS, ROUTINE W REFLEX MICROSCOPIC (NOT AT [facility]) - Abnormal; Notable for the following:    Glucose, UA >1000 (*)    All other components within normal limits  CBG MONITORING, ED - Abnormal; Notable for the following:    Glucose-Capillary 451 (*)    All other  components within normal limits  CBG MONITORING, ED - Abnormal; Notable for the following:    Glucose-Capillary 349 (*)    All other components within normal limits  CBG  MONITORING, ED - Abnormal; Notable for the following:    Glucose-Capillary 304 (*)    All other components within normal limits  CBG MONITORING, ED - Abnormal; Notable for the following:    Glucose-Capillary 234 (*)    All other components within normal limits  CBG MONITORING, ED - Abnormal; Notable for the following:    Glucose-Capillary 185 (*)    All other components within normal limits  CBG MONITORING, ED - Abnormal; Notable for the following:    Glucose-Capillary 173 (*)    All other components within normal limits  CBC  URINE MICROSCOPIC-ADD ON    Imaging Review No results found.   MDM   Final diagnoses:  Hyperglycemia    Your testing has been normal - your blood sugar has been high but has been lowered, appropriately.   I personally performed the services described in this documentation, which was scribed in my presence. The recorded information has been reviewed and is accurate.       Noemi Chapel, MD 02/15/15 [phone]

## 2015-02-15 NOTE — ED Notes (Signed)
Pt seen by home care nurse today for abilify shot, and found his BP to be 86/54.  Pt also concerned about blood sugar management, stating this morning his CBG was 75 before eating breakfast which is unusually low for him.

## 2015-04-05 ENCOUNTER — Emergency Department (HOSPITAL_COMMUNITY)
Admission: EM | Admit: 2015-04-05 | Discharge: 2015-04-07 | Disposition: A | Discharge status: Other Acute Inpatient Hospital | Payer: BLUE CROSS/BLUE SHIELD | Attending: Emergency Medicine | Admitting: Emergency Medicine

## 2015-04-05 ENCOUNTER — Emergency Department (HOSPITAL_COMMUNITY): Payer: BLUE CROSS/BLUE SHIELD

## 2015-04-05 ENCOUNTER — Encounter (HOSPITAL_COMMUNITY): Payer: Self-pay | Admitting: Emergency Medicine

## 2015-04-05 DIAGNOSIS — M1990 Unspecified osteoarthritis, unspecified site: Secondary | ICD-10-CM | POA: Diagnosis not present

## 2015-04-05 DIAGNOSIS — Z791 Long term (current) use of non-steroidal anti-inflammatories (NSAID): Secondary | ICD-10-CM | POA: Diagnosis not present

## 2015-04-05 DIAGNOSIS — F1110 Opioid abuse, uncomplicated: Secondary | ICD-10-CM | POA: Diagnosis not present

## 2015-04-05 DIAGNOSIS — Z794 Long term (current) use of insulin: Secondary | ICD-10-CM | POA: Diagnosis not present

## 2015-04-05 DIAGNOSIS — E11621 Type 2 diabetes mellitus with foot ulcer: Secondary | ICD-10-CM | POA: Diagnosis not present

## 2015-04-05 DIAGNOSIS — Z7951 Long term (current) use of inhaled steroids: Secondary | ICD-10-CM | POA: Insufficient documentation

## 2015-04-05 DIAGNOSIS — I10 Essential (primary) hypertension: Secondary | ICD-10-CM | POA: Diagnosis not present

## 2015-04-05 DIAGNOSIS — E1165 Type 2 diabetes mellitus with hyperglycemia: Secondary | ICD-10-CM | POA: Insufficient documentation

## 2015-04-05 DIAGNOSIS — F32A Depression, unspecified: Secondary | ICD-10-CM

## 2015-04-05 DIAGNOSIS — K219 Gastro-esophageal reflux disease without esophagitis: Secondary | ICD-10-CM | POA: Diagnosis not present

## 2015-04-05 DIAGNOSIS — F1410 Cocaine abuse, uncomplicated: Secondary | ICD-10-CM | POA: Diagnosis not present

## 2015-04-05 DIAGNOSIS — E785 Hyperlipidemia, unspecified: Secondary | ICD-10-CM | POA: Diagnosis not present

## 2015-04-05 DIAGNOSIS — G8929 Other chronic pain: Secondary | ICD-10-CM | POA: Diagnosis not present

## 2015-04-05 DIAGNOSIS — Z87442 Personal history of urinary calculi: Secondary | ICD-10-CM | POA: Insufficient documentation

## 2015-04-05 DIAGNOSIS — F332 Major depressive disorder, recurrent severe without psychotic features: Secondary | ICD-10-CM | POA: Diagnosis not present

## 2015-04-05 DIAGNOSIS — L97519 Non-pressure chronic ulcer of other part of right foot with unspecified severity: Secondary | ICD-10-CM | POA: Insufficient documentation

## 2015-04-05 DIAGNOSIS — Z720 Tobacco use: Secondary | ICD-10-CM | POA: Insufficient documentation

## 2015-04-05 DIAGNOSIS — Z79899 Other long term (current) drug therapy: Secondary | ICD-10-CM | POA: Insufficient documentation

## 2015-04-05 DIAGNOSIS — J45909 Unspecified asthma, uncomplicated: Secondary | ICD-10-CM | POA: Diagnosis not present

## 2015-04-05 DIAGNOSIS — R45851 Suicidal ideations: Secondary | ICD-10-CM | POA: Diagnosis not present

## 2015-04-05 DIAGNOSIS — R739 Hyperglycemia, unspecified: Secondary | ICD-10-CM

## 2015-04-05 DIAGNOSIS — F329 Major depressive disorder, single episode, unspecified: Secondary | ICD-10-CM | POA: Diagnosis not present

## 2015-04-05 DIAGNOSIS — Z789 Other specified health status: Secondary | ICD-10-CM

## 2015-04-05 LAB — COMPREHENSIVE METABOLIC PANEL
ALT: 20 U/L (ref 17–63)
AST: 24 U/L (ref 15–41)
Albumin: 4.7 g/dL (ref 3.5–5.0)
Alkaline Phosphatase: 97 U/L (ref 38–126)
Anion gap: 11 (ref 5–15)
BUN: 16 mg/dL (ref 6–20)
CO2: 26 mmol/L (ref 22–32)
Calcium: 10.2 mg/dL (ref 8.9–10.3)
Chloride: 100 mmol/L — ABNORMAL LOW (ref 101–111)
Creatinine, Ser: 1.29 mg/dL — ABNORMAL HIGH (ref 0.61–1.24)
GFR calc Af Amer: >60 mL/min (ref >60)
GFR calc non Af Amer: >60 mL/min (ref >60)
Glucose, Bld: 315 mg/dL — ABNORMAL HIGH (ref 65–99)
POTASSIUM: 4.1 mmol/L (ref 3.5–5.1)
Sodium: 137 mmol/L (ref 135–145)
TOTAL PROTEIN: 9.1 g/dL — AB (ref 6.5–8.1)
Total Bilirubin: 1.3 mg/dL — ABNORMAL HIGH (ref 0.3–1.2)

## 2015-04-05 LAB — BLOOD GAS, VENOUS
Acid-base deficit: 0.2 mmol/L (ref 0.0–2.0)
BICARBONATE: 25.2 meq/L — AB (ref 20.0–24.0)
Drawn by: 399401
FIO2: 0.21
O2 Saturation: 83.8 %
PH VEN: 7.361 — AB (ref 7.250–7.300)
PO2 VEN: 45.5 mmHg — AB (ref 30.0–45.0)
Patient temperature: 97.6
TCO2: 22.8 mmol/L (ref 0–100)
pCO2, Ven: 45.3 mmHg (ref 45.0–50.0)

## 2015-04-05 LAB — ETHANOL: Alcohol, Ethyl (B): <5 mg/dL (ref <5)

## 2015-04-05 LAB — CBG MONITORING, ED: GLUCOSE-CAPILLARY: 285 mg/dL — AB (ref 65–99)

## 2015-04-05 LAB — CBC
HEMATOCRIT: 42.3 % (ref 39.0–52.0)
HEMOGLOBIN: 14.8 g/dL (ref 13.0–17.0)
MCH: 31.8 pg (ref 26.0–34.0)
MCHC: 35 g/dL (ref 30.0–36.0)
MCV: 90.8 fL (ref 78.0–100.0)
Platelets: 199 10*3/uL (ref 150–400)
RBC: 4.66 MIL/uL (ref 4.22–5.81)
RDW: 12.5 % (ref 11.5–15.5)
WBC: 5.7 10*3/uL (ref 4.0–10.5)

## 2015-04-05 MED ORDER — INSULIN GLARGINE 100 UNIT/ML ~~LOC~~ SOLN
45.0000 [IU] | Freq: Every day | SUBCUTANEOUS | Status: DC
  Administered 2015-04-06 (×2): 45 [IU] via SUBCUTANEOUS
  Filled 2015-04-05 (×3): qty 0.45

## 2015-04-05 MED ORDER — MELOXICAM 15 MG PO TABS
15.0000 mg | ORAL_TABLET | Freq: Every day | ORAL | Status: DC
  Administered 2015-04-06 – 2015-04-07 (×3): 15 mg via ORAL
  Filled 2015-04-05 (×4): qty 1

## 2015-04-05 MED ORDER — LORATADINE 10 MG PO TABS
10.0000 mg | ORAL_TABLET | Freq: Every day | ORAL | Status: DC
  Administered 2015-04-06 – 2015-04-07 (×3): 10 mg via ORAL
  Filled 2015-04-05 (×3): qty 1

## 2015-04-05 MED ORDER — METFORMIN HCL 500 MG PO TABS
1000.0000 mg | ORAL_TABLET | Freq: Two times a day (BID) | ORAL | Status: DC
  Administered 2015-04-06 – 2015-04-07 (×4): 1000 mg via ORAL
  Filled 2015-04-05 (×5): qty 2

## 2015-04-05 MED ORDER — BUDESONIDE-FORMOTEROL FUMARATE 80-4.5 MCG/ACT IN AERO
2.0000 | INHALATION_SPRAY | Freq: Two times a day (BID) | RESPIRATORY_TRACT | Status: DC
  Administered 2015-04-06 (×2): 2 via RESPIRATORY_TRACT
  Filled 2015-04-05: qty 6.9

## 2015-04-05 MED ORDER — PANTOPRAZOLE SODIUM 40 MG PO TBEC
40.0000 mg | DELAYED_RELEASE_TABLET | Freq: Every day | ORAL | Status: DC
  Administered 2015-04-06 – 2015-04-07 (×3): 40 mg via ORAL
  Filled 2015-04-05 (×3): qty 1

## 2015-04-05 MED ORDER — LOSARTAN POTASSIUM 25 MG PO TABS: 25.0000 mg | ORAL_TABLET | Freq: Every day | ORAL | Status: DC

## 2015-04-05 MED ORDER — ASPIRIN EC 81 MG PO TBEC
81.0000 mg | DELAYED_RELEASE_TABLET | Freq: Every day | ORAL | Status: DC
  Administered 2015-04-06 – 2015-04-07 (×3): 81 mg via ORAL
  Filled 2015-04-05 (×3): qty 1

## 2015-04-05 MED ORDER — METFORMIN HCL 500 MG PO TABS
1000.0000 mg | ORAL_TABLET | Freq: Once | ORAL | Status: AC
  Administered 2015-04-06: 1000 mg via ORAL
  Filled 2015-04-05: qty 2

## 2015-04-05 MED ORDER — SODIUM CHLORIDE 0.9 % IV BOLUS (SEPSIS)
1000.0000 mL | Freq: Once | INTRAVENOUS | Status: AC
  Administered 2015-04-05: 1000 mL via INTRAVENOUS

## 2015-04-05 MED ORDER — INSULIN ASPART 100 UNIT/ML ~~LOC~~ SOLN
10.0000 [IU] | Freq: Three times a day (TID) | SUBCUTANEOUS | Status: DC
  Administered 2015-04-06 – 2015-04-07 (×4): 10 [IU] via SUBCUTANEOUS
  Filled 2015-04-05 (×5): qty 1

## 2015-04-05 MED ORDER — ALBUTEROL SULFATE HFA 108 (90 BASE) MCG/ACT IN AERS: 2.0000 | INHALATION_SPRAY | Freq: Four times a day (QID) | RESPIRATORY_TRACT | Status: DC | PRN

## 2015-04-05 MED ORDER — BUPROPION HCL ER (XL) 300 MG PO TB24
300.0000 mg | ORAL_TABLET | Freq: Every day | ORAL | Status: DC
  Administered 2015-04-06 – 2015-04-07 (×3): 300 mg via ORAL
  Filled 2015-04-05 (×4): qty 1

## 2015-04-05 MED ORDER — GABAPENTIN 300 MG PO CAPS
600.0000 mg | ORAL_CAPSULE | Freq: Three times a day (TID) | ORAL | Status: DC
  Administered 2015-04-06 – 2015-04-07 (×6): 600 mg via ORAL
  Filled 2015-04-05 (×6): qty 2

## 2015-04-05 NOTE — BH Assessment (Signed)
2106:  Consulted with Earley Favor, NP about the Patient.  Reports the Patient is depressed, attempted suicide a month ago, is currently reporting suicidal ideations with no plan.   2111:  Schedule tele-assessment.  2125:  Informed by Nurse Scarlette Calico, Doctor is with Patient and it will probably be 30 minutes before Patient can be assessed.    2147:  Tele-assessment completed.  2148:  Contacted the Patient's [facility] [employer] Counselor [phone]), Dorathy Daft.  Per Dorathy Daft the Patient was seen on 04-04-2015 by the ACTT, but did not voice suicidal ideations.  Dorathy Daft reports Patient has mental health diagnoses of Major Depressive Disorder with psychotic features and Stimulant Use Disorder.  Dorathy Daft reports being unaware that the Patient has not been medication adherent to Zoloft and Wellbutrin.  She reports Patient receive monthly Abilify shot by ACTT Nurse.  2215:  Consulted with Extender Maryjean Morn, PA; Per Rito Ehrlich;  Patient meets inpatient criteria; seek outside placement for mental health and substance use.  2220:  Provided Patient's disposition to [employer]  Dorathy Daft reports she will visit with the Patient on 04-06-2015 at 1000  2225:  Provided Patient's disposition to Earley Favor.

## 2015-04-05 NOTE — ED Notes (Signed)
Called pt for blood draw, no answer. 

## 2015-04-05 NOTE — ED Notes (Signed)
Called pt to obtain labs x1 no answer.

## 2015-04-05 NOTE — BH Assessment (Signed)
Assessment Note  Wayne [name] is an 39 y.o. male, who reports taking a bus to [employer].  The Patient presents orientated x3, mood "depressed", affect congruent with mood and flat.  Patient reports current suicidal ideations with no plan or intent.  The Patient denied HI and AVH.  The Patient reports experiencing suicidal ideations since 04-04-2015.  Patient reports receiving monthly Abilify shots and not being medication adherent to daily Zoloft and Wellbutrin medication regimens.  Patient reports being hospitalized a month ago at [facility] for a suicide attempt by overdosed of prescribed psychotropic and medical medications.   He reports being hospitalized 2 weeks ago at [facility] for depression and suicidal ideations.  The Patient has an ACTT with [facility] in [place].  Collateral information obtained from [facility]; the Patient has mental health diagnoses of Major Depressive Disorder w/psychotic features and Stimulant Use Disorder.  The Patient reports smoking crack cocaine 2 days ago of $100 worth.  He reports smoking crack cocaine 1x per month.  Patient reports feeling so depressed most days he does not get out of bed or take care of daily personal hygiene.  Patient reports having a poor appetite and losing 100 lbs in a year and sleeping 3 hours per night.  Patient reports an upcoming court date of 05-26-2015 for failure to pay a fine.       Axis I: Major Depression, Recurrent severe and Stimulant Use Disorder Axis II: Deferred Axis IV: housing problems, other psychosocial or environmental problems and problems related to legal system/crime Axis V: 11-20 some danger of hurting self or others possible OR occasionally fails to maintain minimal personal hygiene OR gross impairment in communication  Past Medical History:  Past Medical History  Diagnosis Date  . Depression   . Diabetes mellitus   . Hyperlipidemia   . HTN (hypertension)   . Chronic headaches   . Osteoarthritis    . Kidney stones   . Polysubstance abuse     cocaine, marijuana-quit summer 2010  . Tobacco abuse   . Asthma   . Chest pain   . Lumbar disc disease   . GERD (gastroesophageal reflux disease)   . OSA (obstructive sleep apnea)   . Peripheral neuropathy 02/01/2011  . Bipolar 1 disorder   . Ingrown nail 11/12/2013    Past Surgical History  Procedure Laterality Date  . Knee surgery    . Bunionectomy  06-2009    Family History:  Family History  Problem Relation Age of Onset  . Heart disease Mother   . Diabetes Father   . Hyperlipidemia Father   . Emphysema Maternal Uncle   . Asthma Sister   . Asthma Mother   . Stroke Mother   . Hypertension Father   . Alzheimer's disease Maternal Grandmother   . Depression Father   . Breast cancer Maternal Aunt   . COPD Mother     maternal un    Social History:  reports that he has been smoking Cigarettes.  He has a 3.4 pack-year smoking history. He has quit using smokeless tobacco. He reports that he does not drink alcohol or use illicit drugs.  Additional Social History:     CIWA: CIWA-Ar BP: 106/86 mmHg Pulse Rate: 107 COWS:    Allergies:  Allergies  Allergen Reactions  . Iodine     rash  . Lipitor [Atorvastatin]     All statins. Muscle weakness.   . Nystatin   . Statins Other (See Comments)  Muscle weakness, stomach cramps    Home Medications:  (Not in a hospital admission)  OB/GYN Status:  No LMP for male patient.  General Assessment Data Location of Assessment: [facility] ED TTS Assessment: In system Is this a Tele or Face-to-Face Assessment?: Tele Assessment Is this an Initial Assessment or a Re-assessment for this encounter?: Initial Assessment Marital status: Single Maiden name: N/A Is patient pregnant?: No Pregnancy Status: Other (Comment) (N/A) Living Arrangements: Other relatives (Lives with Mother) Can pt return to current living arrangement?: Yes Admission Status: Voluntary Is patient capable of signing  voluntary admission?: Yes Referral Source: Self/Family/Friend Insurance type: Probation officer [employer] Screening Exam [facility] Walk-in ONLY) Medical Exam completed: Yes  Crisis Care Plan Living Arrangements: Other relatives (Lives with Mother) Name of Psychiatrist: [facility] Name of Therapist: Kayla  Education Status Is patient currently in school?: No Current Grade: N/A Highest grade of school patient has completed: 12th Name of school: N/A Contact person: N/A  Risk to self with the past 6 months Suicidal Ideation: Yes-Currently Present Has patient been a risk to self within the past 6 months prior to admission? : Yes Suicidal Intent: No Has patient had any suicidal intent within the past 6 months prior to admission? : Yes Is patient at risk for suicide?: Yes Suicidal Plan?: No-Not Currently/Within Last 6 Months Has patient had any suicidal plan within the past 6 months prior to admission? : Yes Access to Means: No What has been your use of drugs/alcohol within the last 12 months?: Crack Cocaine Previous Attempts/Gestures: Yes How many times?: 1 Other Self Harm Risks: None Triggers for Past Attempts: Other (Comment) (depression) Intentional Self Injurious Behavior: None Family Suicide History: Unknown Recent stressful life event(s): Other (Comment) (Major Depression) Persecutory voices/beliefs?: No Depression: Yes Depression Symptoms: Insomnia, Isolating, Fatigue, Feeling worthless/self pity, Loss of interest in usual pleasures Substance abuse history and/or treatment for substance abuse?: Yes (Crack cocaine) Suicide prevention information given to non-admitted patients: Not applicable  Risk to Others within the past 6 months Homicidal Ideation: No Does patient have any lifetime risk of violence toward others beyond the six months prior to admission? : No Thoughts of Harm to Others: No Current Homicidal Intent: No Current Homicidal Plan: No Access to Homicidal  Means: No Identified Victim: N/A History of harm to others?: No Assessment of Violence: None Noted Violent Behavior Description: None Does patient have access to weapons?: No (Patient denied access or possession of a gun) Criminal Charges Pending?: Yes Describe Pending Criminal Charges: Failure to Pay a Fine Does patient have a court date: Yes Court Date: 05/26/15 Is patient on probation?: No  Psychosis Hallucinations: None noted Delusions: None noted  Mental Status Report Appearance/Hygiene: In scrubs Eye Contact: Fair Motor Activity: Unremarkable Speech: Logical/coherent, Soft Level of Consciousness: Alert Mood: Depressed Affect: Depressed, Flat Anxiety Level: Minimal Thought Processes: Coherent, Relevant, Circumstantial Judgement: Impaired Orientation: Person, Place, Time Obsessive Compulsive Thoughts/Behaviors: None  Cognitive Functioning Concentration: Decreased Memory: Recent Intact, Remote Intact IQ: Average Impulse Control: Poor Appetite: Poor Weight Loss: 100 (over a year due to appetite and Diabetes) Weight Gain: 0 Sleep: Decreased Total Hours of Sleep: 3 Vegetative Symptoms: Staying in bed, Not bathing, Decreased grooming  ADLScreening [facility] Assessment Services) Patient's cognitive ability adequate to safely complete daily activities?: Yes Patient able to express need for assistance with ADLs?: Yes Independently performs ADLs?: Yes (appropriate for developmental age)  Prior Inpatient Therapy Prior Inpatient Therapy: Yes Prior Therapy Dates: August 2016 Prior Therapy Facilty/Provider(s): [facility] Reason for Treatment: Depression and SI  Prior Outpatient Therapy Prior Outpatient Therapy: Yes Prior Therapy Dates: Current Prior Therapy Facilty/Provider(s): [facility]) Reason for Treatment: Major Depressive Disorder w/psychothic featues & Stimulant Use Disorder Does patient have an ACCT team?: Yes Does patient have Intensive  In-House Services?  : No Does patient have [facility] services? : No Does patient have P4CC services?: No  ADL Screening (condition at time of admission) Patient's cognitive ability adequate to safely complete daily activities?: Yes Is the patient deaf or have difficulty hearing?: No Does the patient have difficulty seeing, even when wearing glasses/contacts?: No Does the patient have difficulty concentrating, remembering, or making decisions?: No Patient able to express need for assistance with ADLs?: Yes Does the patient have difficulty dressing or bathing?: No Independently performs ADLs?: Yes (appropriate for developmental age) Does the patient have difficulty walking or climbing stairs?: No Weakness of Legs: None Weakness of Arms/Hands: None  Home Assistive Devices/Equipment Home Assistive Devices/Equipment: None    Abuse/Neglect Assessment (Assessment to be complete while patient is alone) Physical Abuse: Denies Verbal Abuse: Denies Sexual Abuse: Denies Exploitation of patient/patient's resources: Denies Self-Neglect: Denies Values / Beliefs Cultural Requests During Hospitalization: None Spiritual Requests During Hospitalization: None   Advance Directives (For Healthcare) Does patient have an advance directive?: No Would patient like information on creating an advanced directive?: No - patient declined information    Additional Information 1:1 In Past 12 Months?: Yes CIRT Risk: No Elopement Risk: No Does patient have medical clearance?: Yes     Disposition:  Disposition Initial Assessment Completed for this Encounter: Yes Disposition of Patient: Inpatient treatment program Type of inpatient treatment program: Adult  On Site Evaluation by:   Reviewed with Physician:    Dey-Johnson,Yvonnie Schinke 04/05/2015 10:45 PM

## 2015-04-05 NOTE — ED Provider Notes (Signed)
MSE was initiated and I personally evaluated the patient and placed orders (if any) at  8:52 PM on April 05, 2015.  The patAsmar A [name] is a 39 y.o. male with a medical hx of DM, neuropathy, who presents to the Emergency Department complaining of depression onset 1 week. He notes that his sugar was high and he feels down and suicidal. Pt notes that he last harmed himself several months ago by overdosing on all his medications. Pt stopped taking his insulin 1 week ago because he got tired of sticking himself and his blood sugars has been in the 200's. Pt also has an ulcer to the bottom of his right foot that he is being seen for and having it currently debrided. 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 He states that he is having associated symptoms of suicidal. He denies fatigue, wound, and any other symptoms. ient appears stable so that the remainder of the MSE may be completed by another provider. Foot examined dressing has not been changed in over a week.  Extreme foot odor.  The surrounding ulcer is a 3 millimeter area of white boggy-looking tissue.  No discharge  Earley Favor, NP 04/05/15 2056  Laurence Spates, MD 04/05/15 [phone]

## 2015-04-05 NOTE — ED Notes (Signed)
Pt from home c/o hyperglycemia, blurred vision, and depression x 1 week. He reports CBG at home have been in the 230's. He reports Suicidal ideations with no plan, denies HI. He c/o chronic bilateral foot pain he rate 7/10.  Pt has an ulcer to bottom of foot and he wears a  Boot.

## 2015-04-05 NOTE — [facility] Counselor (Signed)
Referral packet sent 04/05/15 11:26p.m. to [place], [place], [place], [address], [place], [place], and [place] K. Tiburcio Pea, MS  Counselor 04/05/2015 11:30 PM

## 2015-04-05 NOTE — ED Provider Notes (Addendum)
CSN: [id number]     Arrival date & time 04/05/15  1921 History   First MD Initiated Contact with Patient 04/05/15 2040     Chief Complaint  Patient presents with  . Depression     (Consider location/radiation/quality/duration/timing/severity/associated sxs/prior Treatment) HPI  39 year old male history of diabetes, hypertension, bipolar disorder, and foot ulcer who presents today stating he has been out of his medications and he is depressed. He has had thoughts of harming himself but does not have a plan. He states his taken an overdose in the past. He has been out of his oral anti-suppressant. He has received his injectable medication last week. He states that the foot ulcer is stable for his report. He states he has had it for year and is [facility]. He states it is getting better. He denies any fever or increased pain or discharge.  Past Medical History  Diagnosis Date  . Depression   . Diabetes mellitus   . Hyperlipidemia   . HTN (hypertension)   . Chronic headaches   . Osteoarthritis   . Kidney stones   . Polysubstance abuse     cocaine, marijuana-quit summer 2010  . Tobacco abuse   . Asthma   . Chest pain   . Lumbar disc disease   . GERD (gastroesophageal reflux disease)   . OSA (obstructive sleep apnea)   . Peripheral neuropathy 02/01/2011  . Bipolar 1 disorder   . Ingrown nail 11/12/2013   Past Surgical History  Procedure Laterality Date  . Knee surgery    . Bunionectomy  06-2009   Family History  Problem Relation Age of Onset  . Heart disease Mother   . Diabetes Father   . Hyperlipidemia Father   . Emphysema Maternal Uncle   . Asthma Sister   . Asthma Mother   . Stroke Mother   . Hypertension Father   . Alzheimer's disease Maternal Grandmother   . Depression Father   . Breast cancer Maternal Aunt   . COPD Mother     maternal un   Social History  Substance Use Topics  . Smoking status: Current Some Day Smoker -- 0.20 packs/day for 17  years    Types: Cigarettes    Last Attempt to Quit: 02/24/2011  . Smokeless tobacco: Former Systems developer  . Alcohol Use: No     Comment: socially    Review of Systems  All other systems reviewed and are negative.     Allergies  Iodine; Lipitor; Nystatin; and Statins  Home Medications   Prior to Admission medications   Medication Sig Start Date End Date Taking? Authorizing Provider  albuterol (PROVENTIL HFA;VENTOLIN HFA) 108 (90 BASE) MCG/ACT inhaler Inhale 2 puffs into the lungs every 6 (six) hours as needed for wheezing (wheezing). For shortness of breath 01/09/15   Elmyra Ricks Pisciotta, PA-C  ARIPiprazole (ABILIFY) 10 MG tablet Take 1 tablet (10 mg total) by mouth 2 (two) times daily. 08/09/14   Niel Hummer, NP  ARIPiprazole (ABILIFY) 20 MG tablet Take 10 mg by mouth 2 (two) times daily.    Historical Provider, MD  aspirin EC 81 MG tablet Take 1 tablet (81 mg total) by mouth daily. Patient not taking: Reported on 01/06/2015 08/09/14   Niel Hummer, NP  Blood Glucose Monitoring Suppl (TRUE METRIX METER) W/DEVICE KIT 1 Device by Does not apply route as needed. 01/09/15   Debby Freiberg, MD  budesonide-formoterol [facility]) 80-4.5 MCG/ACT inhaler Inhale 2 puffs into the lungs 2 (two)  times daily. 01/09/15   Nicole Pisciotta, PA-C  buPROPion (WELLBUTRIN XL) 300 MG 24 hr tablet Take 1 tablet (300 mg total) by mouth daily. 08/09/14   Niel Hummer, NP  dextromethorphan-guaiFENesin [facility]) 30-600 MG per 12 hr tablet Take 1 tablet by mouth 2 (two) times daily as needed for cough (cough).    Historical Provider, MD  esomeprazole (NEXIUM) 40 MG capsule Take 1 capsule (40 mg total) by mouth daily. 01/15/15   Arnoldo Morale, MD  ezetimibe (ZETIA) 10 MG tablet Take 1 tablet (10 mg total) by mouth daily. 01/15/15   Arnoldo Morale, MD  fluticasone (FLONASE) 50 MCG/ACT nasal spray Place 2 sprays into both nostrils daily. 08/09/14   Niel Hummer, NP  gabapentin (NEURONTIN) 300 MG capsule Take 2 capsules (600 mg  total) by mouth 3 (three) times daily. 01/15/15   Arnoldo Morale, MD  glucose blood test strip Use as instructed 01/09/15   Debby Freiberg, MD  glucose monitoring kit (FREESTYLE) monitoring kit 1 each by Does not apply route as needed for other. For daily blood glucose monitoring related to diabetes. 01/09/15   Nicole Pisciotta, PA-C  ibuprofen (ADVIL,MOTRIN) 200 MG tablet Take 600 mg by mouth every 8 (eight) hours as needed for mild pain or moderate pain.     Historical Provider, MD  insulin aspart (NOVOLOG) 100 UNIT/ML injection Inject 10 Units into the skin 3 (three) times daily before meals. 02/05/15   Arnoldo Morale, MD  insulin glargine (LANTUS) 100 UNIT/ML injection Inject 0.45 mLs (45 Units total) into the skin at bedtime. 01/09/15 03/29/16  Monico Blitz, PA-C  Lancet Devices MISC Use PRN for blood glucose monitoring 01/09/15   Debby Freiberg, MD  loratadine (CLARITIN) 10 MG tablet Take 1 tablet (10 mg total) by mouth daily. 08/09/14   Niel Hummer, NP  losartan (COZAAR) 25 MG tablet Take 1 tablet (25 mg total) by mouth daily. 01/15/15   Arnoldo Morale, MD  meloxicam (MOBIC) 15 MG tablet Take 15 mg by mouth daily.    Historical Provider, MD  metFORMIN (GLUCOPHAGE) 1000 MG tablet Take 1 tablet (1,000 mg total) by mouth 2 (two) times daily with a meal. 01/15/15   Arnoldo Morale, MD  methocarbamol (ROBAXIN) 500 MG tablet Take 1 tablet (500 mg total) by mouth every 8 (eight) hours as needed for muscle spasms (muscle spasms). 01/15/15   Arnoldo Morale, MD  Multiple Vitamin (MULTIVITAMIN WITH MINERALS) TABS tablet Take 1 tablet by mouth daily. Patient not taking: Reported on 01/06/2015 08/09/14   Niel Hummer, NP  saxagliptin HCl (ONGLYZA) 5 MG TABS tablet Take 1 tablet (5 mg total) by mouth daily. 01/15/15   Arnoldo Morale, MD  sertraline (ZOLOFT) 100 MG tablet Take 1 tablet (100 mg total) by mouth at bedtime. 08/09/14   Niel Hummer, NP  solifenacin (VESICARE) 5 MG tablet Take 1 tablet (5 mg total) by mouth daily.  02/03/15   Arnoldo Morale, MD  traMADol (ULTRAM) 50 MG tablet Take 1 tablet by mouth every 6 (six) hours as needed. pain 11/18/14   Historical Provider, MD  zolpidem (AMBIEN) 5 MG tablet Take 1 tablet (5 mg total) by mouth at bedtime as needed for sleep. 08/09/14   Niel Hummer, NP   BP 106/86 mmHg  Pulse 107  Temp(Src) 97.6 F (36.4 C) (Oral)  Resp 18  SpO2 100% Physical Exam  Constitutional: He is oriented to person, place, and time. He appears well-developed and well-nourished.  Thin  HENT:  Head: Normocephalic and atraumatic.  Right Ear: External ear normal.  Left Ear: External ear normal.  Nose: Nose normal.  Mouth/Throat: Oropharynx is clear and moist.  Eyes: Conjunctivae and EOM are normal. Pupils are equal, round, and reactive to light.  Neck: Normal range of motion. Neck supple.  Cardiovascular: Normal rate, regular rhythm and normal heart sounds.   Pulmonary/Chest: Effort normal and breath sounds normal.  Abdominal: Soft. Bowel sounds are normal.  Musculoskeletal: Normal range of motion.  Right foot ulcer without surrounding erythema or discharge. 2 cm circumference. Pulses are intact. Sensation is intact.  Neurological: He is alert and oriented to person, place, and time.  Skin: Skin is warm and dry.  Nursing note and vitals reviewed.   ED Course  Procedures (including critical care time) Labs Review Labs Reviewed  COMPREHENSIVE METABOLIC PANEL - Abnormal; Notable for the following:    Chloride 100 (*)    Glucose, Bld 315 (*)    Creatinine, Ser 1.29 (*)    Total Protein 9.1 (*)    Total Bilirubin 1.3 (*)    All other components within normal limits  CBG MONITORING, ED - Abnormal; Notable for the following:    Glucose-Capillary 285 (*)    All other components within normal limits  ETHANOL  CBC  URINE RAPID DRUG SCREEN, HOSP PERFORMED  BLOOD GAS, VENOUS    Imaging Review No results found. I have personally reviewed and evaluated these images and lab results  as part of my medical decision-making.   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 2 insulin dependent diabetes patient hyperglycemic without any evidence of DKA. I have restarted his home insulin and placed by Arnette Norris control orders.  3- right foot diabetic ulcer-no evidence of acute worsening. Plain x-rays show no evidence of osteomyelitis.  Pattricia Boss, MD 04/05/15 [id number]  Pattricia Boss, MD 04/06/15 [id number]

## 2015-04-05 NOTE — ED Notes (Signed)
Bed: [facility] Expected date:  Expected time:  Means of arrival:  Comments: T3

## 2015-04-05 NOTE — ED Notes (Signed)
Pt has been changed into scrubs and wanded 

## 2015-04-06 DIAGNOSIS — R45851 Suicidal ideations: Secondary | ICD-10-CM | POA: Diagnosis not present

## 2015-04-06 DIAGNOSIS — F332 Major depressive disorder, recurrent severe without psychotic features: Secondary | ICD-10-CM

## 2015-04-06 LAB — CBG MONITORING, ED
GLUCOSE-CAPILLARY: 325 mg/dL — AB (ref 65–99)
GLUCOSE-CAPILLARY: 64 mg/dL — AB (ref 65–99)
GLUCOSE-CAPILLARY: 82 mg/dL (ref 65–99)
GLUCOSE-CAPILLARY: 143 mg/dL — AB (ref 65–99)
Glucose-Capillary: 313 mg/dL — ABNORMAL HIGH (ref 65–99)
Glucose-Capillary: 130 mg/dL — ABNORMAL HIGH (ref 65–99)
Glucose-Capillary: 141 mg/dL — ABNORMAL HIGH (ref 65–99)

## 2015-04-06 LAB — RAPID URINE DRUG SCREEN, HOSP PERFORMED
AMPHETAMINES: NOT DETECTED
BARBITURATES: NOT DETECTED
Benzodiazepines: NOT DETECTED
COCAINE: POSITIVE — AB
OPIATES: POSITIVE — AB
TETRAHYDROCANNABINOL: NOT DETECTED

## 2015-04-06 LAB — C DIFFICILE QUICK SCREEN W PCR REFLEX
C DIFFICILE (CDIFF) TOXIN: NEGATIVE
C DIFFICLE (CDIFF) ANTIGEN: NEGATIVE
C Diff interpretation: NEGATIVE

## 2015-04-06 MED ORDER — INSULIN ASPART PROT & ASPART (70-30 MIX) 100 UNIT/ML ~~LOC~~ SUSP
10.0000 [IU] | Freq: Once | SUBCUTANEOUS | Status: DC
  Filled 2015-04-06: qty 10

## 2015-04-06 MED ORDER — INSULIN ASPART 100 UNIT/ML ~~LOC~~ SOLN
10.0000 [IU] | Freq: Once | SUBCUTANEOUS | Status: AC
  Administered 2015-04-06: 10 [IU] via SUBCUTANEOUS
  Filled 2015-04-06: qty 1

## 2015-04-06 MED ORDER — INSULIN ASPART 100 UNIT/ML ~~LOC~~ SOLN
5.0000 [IU] | Freq: Once | SUBCUTANEOUS | Status: AC
  Administered 2015-04-06: 5 [IU] via SUBCUTANEOUS
  Filled 2015-04-06: qty 1

## 2015-04-06 NOTE — Consult Note (Signed)
[facility] Face-to-Face Psychiatry Consult   Reason for Consult:  Suicidal ideation with plan/intent Referring Physician:  EDP Patient Identification: Wayne [name] MRN:  [id number] Principal Diagnosis: Major depressive disorder, recurrent, severe without psychotic features Diagnosis:   Patient Active Problem List   Diagnosis Date Noted  . Major depressive disorder, recurrent, severe without psychotic features [F33.2]     Priority: High  . Callus of foot [L84] 01/15/2015  . Recurrent major depression-severe [F33.2] 08/05/2014  . Homicidal ideation [R45.850]   . Hyperglycemia [R73.9]   . Suicidal ideation [R45.851]   . Major depressive disorder, recurrent severe without psychotic features [F33.2]   . Keratoma [L57.0] 05/15/2014  . Onychomycosis [B35.1] 01/04/2014  . Pain in lower limb [M79.606] 01/04/2014  . Metatarsal deformity [M21.969] 01/04/2014  . Ingrown nail [L60.0] 11/12/2013  . Onychia and paronychia of toe [M78.675] 11/12/2013  . Deformity of metatarsal bone of right foot [M21.961] 08/17/2013  . Low TSH level [R94.6] 07/29/2011  . Drug overdose [T50.901A] 07/24/2011  . Toxic encephalopathy [G92] 07/24/2011  . Diabetes mellitus due to underlying condition with complication [Q49.2] 01/00/7121  . Acute renal failure [N17.9] 07/24/2011  . Hyponatremia [E87.1] 07/24/2011  . Anemia [D64.9] 07/24/2011  . Neck pain [M54.2] 05/21/2011  . Hand pain, left [M79.642] 05/21/2011  . Asthma [493] 04/27/2011  . Preventative health care [Z00.00] 04/27/2011  . Peripheral neuropathy [G62.9] 02/01/2011  . GERD [K21.9] 07/10/2010  . OBSTRUCTIVE SLEEP APNEA [G47.33] 09/26/2009  . Backache [M54.9] 06/27/2009  . IBS [K58.9] 06/10/2009  . SCIATICA, RIGHT [M54.30] 06/02/2009  . SUBSTANCE ABUSE, MULTIPLE [IMO0002] 01/10/2009  . CHEST PAIN [R07.9] 01/10/2009  . FRACTURE, INDEX FINGER, RIGHT [S62.609A] 01/10/2009  . LUNG NODULE [J98.4] 12/04/2007  . SHOULDER PAIN, RIGHT [M25.519] 12/04/2007  .  HYPOGONADISM, MALE [E29.1] 10/09/2007  . ERECTILE DYSFUNCTION [F52.8] 09/14/2007  . DIABETES MELLITUS, TYPE II [E11.9] 04/12/2007  . HYPERLIPIDEMIA [E78.5] 04/12/2007  . Depression, major, recurrent [F33.9] 04/12/2007  . Essential hypertension [I10] 04/12/2007  . OSTEOARTHRITIS [M19.90] 04/12/2007  . Headache(784.0) [R51] 04/12/2007  . RENAL CALCULUS, HX OF [Z87.442] 04/12/2007    Total Time spent with patient: 25 minutes  Subjective:   Wayne [name] is a 39 y.o. male patient admitted with reports of suicidal ideation with plan/intent and thoughts of running into traffic or taking an overdose. Pt seen and chart reviewed with NP and MD team. Pt admits that he has stopped taking his insulin to die this way also and he did present with hyperglycemia from a medical standpoint upon arrival as well. Pt  Denies homicidal ideation and psychosis and does not appear to be responding to internal stimuli. Pt has an ACT Team member who is present during the assessment who is able to confirm concerns that pt has shared with [place]. 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 He has been at [facility] before for suicidal ideation/MDD back in Jan 2016 and left [facility] hospital 2 weeks ago for suicidal ideation as well.   HPI:  I have reviewed and concur with HPI below, modified as follows: Wayne [name] is an 39 y.o. male, who reports taking a bus to [employer]. The Patient presents orientated x3, mood "depressed", affect congruent with mood and flat. Patient reports current suicidal ideations with no plan or intent. The Patient denied HI and AVH. The Patient reports experiencing suicidal ideations since 04-04-2015. Patient reports receiving monthly Abilify shots and not being medication adherent to daily Zoloft and Wellbutrin medication  regimens. Patient reports being hospitalized a month ago at [facility] for a suicide attempt by overdosed of prescribed psychotropic and medical medications.  He reports being hospitalized 2 weeks ago at [facility] for depression and suicidal ideations. The Patient has an ACTT with [facility] in [place]. Collateral information obtained from [facility]; the Patient has mental health diagnoses of Major Depressive Disorder w/psychotic features and Stimulant Use Disorder. The Patient reports smoking crack cocaine 2 days ago of $100 worth. He reports smoking crack cocaine 1x per month. Patient reports feeling so depressed most days he does not get out of bed or take care of daily personal hygiene. Patient reports having a poor appetite and losing 100 lbs in a year and sleeping 3 hours per night. Patient reports an upcoming court date of 05-26-2015 for failure to pay a fine.   Pt spent the night in the ED without incident. He was accepted at [facility], but he is voluntary and declined to go there due to how far away it is from his family. Pt would prefer [facility] and he may go there first thing in the morning after some discharges leave there.   HPI Elements:   Location:  psychiatric. Quality:  Worsening. Severity:  Severe. Timing:  constant. Duration:  Chronic. Context:  Exacerbation of underlying MDD secondary to stressors at work.  Past Medical History:  Past Medical History  Diagnosis Date  . Depression   . Diabetes mellitus   . Hyperlipidemia   . HTN (hypertension)   . Chronic headaches   . Osteoarthritis   . Kidney stones   . Polysubstance abuse     cocaine, marijuana-quit summer 2010  . Tobacco abuse   . Asthma   . Chest pain   . Lumbar disc disease   . GERD (gastroesophageal reflux disease)   . OSA (obstructive sleep apnea)   . Peripheral neuropathy 02/01/2011  . Bipolar 1 disorder   . Ingrown nail 11/12/2013    Past Surgical History  Procedure Laterality Date  . Knee surgery    . Bunionectomy  06-2009   Family History:  Family History  Problem Relation Age of Onset  . Heart disease Mother   . Diabetes Father    . Hyperlipidemia Father   . Emphysema Maternal Uncle   . Asthma Sister   . Asthma Mother   . Stroke Mother   . Hypertension Father   . Alzheimer's disease Maternal Grandmother   . Depression Father   . Breast cancer Maternal Aunt   . COPD Mother     maternal un   Social History:  History  Alcohol Use No    Comment: socially     History  Drug Use No    Comment: former cocaine and use    Social History   Social History  . Marital Status: Single    Spouse Name: N/A  . Number of Children: 0  . Years of Education: N/A   Occupational History  . student and landscape    Social History Main Topics  . Smoking status: Current Some Day Smoker -- 0.20 packs/day for 17 years    Types: Cigarettes    Last Attempt to Quit: 02/24/2011  . Smokeless tobacco: Former Systems developer  . Alcohol Use: No     Comment: socially  . Drug Use: No     Comment: former cocaine and use  . Sexual Activity: Not Asked   Other Topics Concern  . None   Social  History Narrative   Additional Social History:                          Allergies:   Allergies  Allergen Reactions  . Ibuprofen Itching  . Iodine     rash  . Lipitor [Atorvastatin]     All statins. Muscle weakness.   . Nystatin   . Statins Other (See Comments)    Muscle weakness, stomach cramps    Labs:  Results for orders placed or performed during the hospital encounter of 04/05/15 (from the past 48 hour(s))  CBG monitoring, ED     Status: Abnormal   Collection Time: 04/05/15  7:50 PM  Result Value Ref Range   Glucose-Capillary 285 (H) 65 - 99 mg/dL  Comprehensive metabolic panel     Status: Abnormal   Collection Time: 04/05/15  8:41 PM  Result Value Ref Range   Sodium 137 135 - 145 mmol/L   Potassium 4.1 3.5 - 5.1 mmol/L   Chloride 100 (L) 101 - 111 mmol/L   CO2 26 22 - 32 mmol/L   Glucose, Bld 315 (H) 65 - 99 mg/dL   BUN 16 6 - 20 mg/dL   Creatinine, Ser 1.29 (H) 0.61 - 1.24 mg/dL   Calcium 10.2 8.9 - 10.3 mg/dL    Total Protein 9.1 (H) 6.5 - 8.1 g/dL   Albumin 4.7 3.5 - 5.0 g/dL   AST 24 15 - 41 U/L   ALT 20 17 - 63 U/L   Alkaline Phosphatase 97 38 - 126 U/L   Total Bilirubin 1.3 (H) 0.3 - 1.2 mg/dL   GFR calc non Af Amer >60 >60 mL/min   GFR calc Af Amer >60 >60 mL/min    Comment: (NOTE) The eGFR has been calculated using the CKD EPI equation. This calculation has not been validated in all clinical situations. eGFR's persistently <60 mL/min signify possible Chronic Kidney Disease.    Anion gap 11 5 - 15  Ethanol (ETOH)     Status: None   Collection Time: 04/05/15  8:41 PM  Result Value Ref Range   Alcohol, Ethyl (B) <5 <5 mg/dL    Comment:        LOWEST DETECTABLE LIMIT FOR SERUM ALCOHOL IS 5 mg/dL FOR MEDICAL PURPOSES ONLY   CBC     Status: None   Collection Time: 04/05/15  8:41 PM  Result Value Ref Range   WBC 5.7 4.0 - 10.5 K/uL   RBC 4.66 4.22 - 5.81 MIL/uL   Hemoglobin 14.8 13.0 - 17.0 g/dL   HCT 42.3 39.0 - 52.0 %   MCV 90.8 78.0 - 100.0 fL   MCH 31.8 26.0 - 34.0 pg   MCHC 35.0 30.0 - 36.0 g/dL   RDW 12.5 11.5 - 15.5 %   Platelets 199 150 - 400 K/uL  Blood gas, venous     Status: Abnormal   Collection Time: 04/05/15 11:17 PM  Result Value Ref Range   FIO2 0.21    pH, Ven 7.361 (H) 7.250 - 7.300   pCO2, Ven 45.3 45.0 - 50.0 mmHg   pO2, Ven 45.5 (H) 30.0 - 45.0 mmHg   Bicarbonate 25.2 (H) 20.0 - 24.0 mEq/L   TCO2 22.8 0 - 100 mmol/L   Acid-base deficit 0.2 0.0 - 2.0 mmol/L   O2 Saturation 83.8 %   Patient temperature 97.6    Collection site VEIN    Drawn by [id number]    Sample type  VEIN   CBG monitoring, ED     Status: Abnormal   Collection Time: 04/06/15  2:15 AM  Result Value Ref Range   Glucose-Capillary 325 (H) 65 - 99 mg/dL  CBG monitoring, ED     Status: Abnormal   Collection Time: 04/06/15  3:59 AM  Result Value Ref Range   Glucose-Capillary 313 (H) 65 - 99 mg/dL  CBG monitoring, ED     Status: Abnormal   Collection Time: 04/06/15  5:57 AM  Result Value Ref  Range   Glucose-Capillary 130 (H) 65 - 99 mg/dL  CBG monitoring, ED     Status: Abnormal   Collection Time: 04/06/15  8:29 AM  Result Value Ref Range   Glucose-Capillary 64 (L) 65 - 99 mg/dL  Urine rapid drug screen (hosp performed) (Not at [facility])     Status: Abnormal   Collection Time: 04/06/15 10:15 AM  Result Value Ref Range   Opiates POSITIVE (A) NONE DETECTED   Cocaine POSITIVE (A) NONE DETECTED   Benzodiazepines NONE DETECTED NONE DETECTED   Amphetamines NONE DETECTED NONE DETECTED   Tetrahydrocannabinol NONE DETECTED NONE DETECTED   Barbiturates NONE DETECTED NONE DETECTED    Comment:        DRUG SCREEN FOR MEDICAL PURPOSES ONLY.  IF CONFIRMATION IS NEEDED FOR ANY PURPOSE, NOTIFY LAB WITHIN 5 DAYS.        LOWEST DETECTABLE LIMITS FOR URINE DRUG SCREEN Drug Class       Cutoff (ng/mL) Amphetamine      1000 Barbiturate      200 Benzodiazepine   035 Tricyclics       009 Opiates          300 Cocaine          300 THC              50     Vitals: Blood pressure 97/58, pulse 91, temperature 98.2 F (36.8 C), temperature source Oral, resp. rate 18, SpO2 98 %.  Risk to Self: Suicidal Ideation: Yes-Currently Present Suicidal Intent: No Is patient at risk for suicide?: Yes Suicidal Plan?: No-Not Currently/Within Last 6 Months Access to Means: No What has been your use of drugs/alcohol within the last 12 months?: Crack Cocaine How many times?: 1 Other Self Harm Risks: None Triggers for Past Attempts: Other (Comment) (depression) Intentional Self Injurious Behavior: None Risk to Others: Homicidal Ideation: No Thoughts of Harm to Others: No Current Homicidal Intent: No Current Homicidal Plan: No Access to Homicidal Means: No Identified Victim: N/A History of harm to others?: No Assessment of Violence: None Noted Violent Behavior Description: None Does patient have access to weapons?: No (Patient denied access or possession of a gun) Criminal Charges Pending?:  Yes Describe Pending Criminal Charges: Failure to Pay a Fine Does patient have a court date: Yes Court Date: 05/26/15 Prior Inpatient Therapy: Prior Inpatient Therapy: Yes Prior Therapy Dates: August 2016 Prior Therapy Facilty[facility]Provider[facility] Reason for Treatment: Depression and SI Prior Outpatient Therapy: Prior Outpatient Therapy: Yes Prior Therapy Dates: Current Prior Therapy Facilty/Provider(s): [facility] [employer] [facility]) Reason for Treatment: Major Depressive Disorder w/psychothic featues & Stimulant Use Disorder Does patient have an ACCT team?: Yes Does patient have Intensive In-House Services?  : No Does patient have [facility] services? : No Does patient have P4CC services?: No  Current Facility-Administered Medications  Medication Dose Route Frequency Provider Last Rate Last Dose  . albuterol (PROVENTIL HFA;VENTOLIN HFA) 108 (90 BASE) MCG/ACT inhaler 2 puff  2 puff  Inhalation Q6H PRN Pattricia Boss, MD      . aspirin EC tablet 81 mg  81 mg Oral Daily Pattricia Boss, MD   81 mg at 04/06/15 1111  . budesonide-formoterol (SYMBICORT) 80-4.5 MCG/ACT inhaler 2 puff  2 puff Inhalation BID Pattricia Boss, MD   2 puff at 04/06/15 1011  . buPROPion (WELLBUTRIN XL) 24 hr tablet 300 mg  300 mg Oral Daily Pattricia Boss, MD   300 mg at 04/06/15 1112  . gabapentin (NEURONTIN) capsule 600 mg  600 mg Oral TID Pattricia Boss, MD   600 mg at 04/06/15 1111  . insulin aspart (novoLOG) injection 10 Units  10 Units Subcutaneous TID [facility] Pattricia Boss, MD   10 Units at 04/06/15 0844  . insulin glargine (LANTUS) injection 45 Units  45 Units Subcutaneous QHS Pattricia Boss, MD   45 Units at 04/06/15 0009  . loratadine (CLARITIN) tablet 10 mg  10 mg Oral Daily Pattricia Boss, MD   10 mg at 04/06/15 1111  . meloxicam (MOBIC) tablet 15 mg  15 mg Oral Daily Pattricia Boss, MD   15 mg at 04/06/15 1111  . metFORMIN (GLUCOPHAGE) tablet 1,000 mg  1,000 mg Oral BID WC Pattricia Boss, MD   1,000 mg at 04/06/15 0845  .  pantoprazole (PROTONIX) EC tablet 40 mg  40 mg Oral Daily Pattricia Boss, MD   40 mg at 04/06/15 1112   Current Outpatient Prescriptions  Medication Sig Dispense Refill  . albuterol (PROVENTIL HFA;VENTOLIN HFA) 108 (90 BASE) MCG/ACT inhaler Inhale 2 puffs into the lungs every 6 (six) hours as needed for wheezing (wheezing). For shortness of breath 1 Inhaler 2  . ARIPiprazole (ABILIFY) 9.75 MG/1.3ML injection Inject 9.75 mg into the muscle once.    . Blood Glucose Monitoring Suppl (TRUE METRIX METER) W/DEVICE KIT 1 Device by Does not apply route as needed. 1 kit 0  . budesonide-formoterol (SYMBICORT) 80-4.5 MCG/ACT inhaler Inhale 2 puffs into the lungs 2 (two) times daily. (Patient taking differently: Inhale 2 puffs into the lungs 2 (two) times daily as needed (for wheezing). ) 1 Inhaler 12  . buPROPion (WELLBUTRIN XL) 300 MG 24 hr tablet Take 1 tablet (300 mg total) by mouth daily. 30 tablet 0  . ezetimibe (ZETIA) 10 MG tablet Take 1 tablet (10 mg total) by mouth daily. 30 tablet 1  . fluticasone (FLONASE) 50 MCG/ACT nasal spray Place 2 sprays into both nostrils daily. (Patient taking differently: Place 2 sprays into both nostrils daily as needed for allergies. )  2  . gabapentin (NEURONTIN) 300 MG capsule Take 2 capsules (600 mg total) by mouth 3 (three) times daily. 180 capsule 1  . glucose blood test strip Use as instructed 100 each 0  . glucose monitoring kit (FREESTYLE) monitoring kit 1 each by Does not apply route as needed for other. For daily blood glucose monitoring related to diabetes. 1 each 1  . insulin aspart (NOVOLOG) 100 UNIT/ML injection Inject 10 Units into the skin 3 (three) times daily before meals. 3 vial 2  . insulin glargine (LANTUS) 100 UNIT/ML injection Inject 0.45 mLs (45 Units total) into the skin at bedtime. 10 mL 4  . Lancet Devices MISC Use PRN for blood glucose monitoring 100 each 0  . loratadine (CLARITIN) 10 MG tablet Take 1 tablet (10 mg total) by mouth daily. (Patient  taking differently: Take 10 mg by mouth daily as needed for allergies. )    . meloxicam (MOBIC) 15 MG tablet Take 15 mg  by mouth daily.    . metFORMIN (GLUCOPHAGE) 1000 MG tablet Take 1 tablet (1,000 mg total) by mouth 2 (two) times daily with a meal. 60 tablet 2  . omeprazole (PRILOSEC) 40 MG capsule Take 40 mg by mouth daily.    . saxagliptin HCl (ONGLYZA) 5 MG TABS tablet Take 1 tablet (5 mg total) by mouth daily. 30 tablet 1  . sertraline (ZOLOFT) 100 MG tablet Take 1 tablet (100 mg total) by mouth at bedtime. 30 tablet 0  . solifenacin (VESICARE) 5 MG tablet Take 1 tablet (5 mg total) by mouth daily. 30 tablet 2  . traMADol (ULTRAM) 50 MG tablet Take 1 tablet by mouth every 6 (six) hours as needed. pain  0  . zolpidem (AMBIEN) 5 MG tablet Take 1 tablet (5 mg total) by mouth at bedtime as needed for sleep. 30 tablet 0  . ARIPiprazole (ABILIFY) 10 MG tablet Take 1 tablet (10 mg total) by mouth 2 (two) times daily. (Patient not taking: Reported on 04/05/2015) 60 tablet 0  . methocarbamol (ROBAXIN) 500 MG tablet Take 1 tablet (500 mg total) by mouth every 8 (eight) hours as needed for muscle spasms (muscle spasms). (Patient not taking: Reported on 04/05/2015) 90 tablet 1  . Multiple Vitamin (MULTIVITAMIN WITH MINERALS) TABS tablet Take 1 tablet by mouth daily. (Patient not taking: Reported on 01/06/2015)      Musculoskeletal: Strength & Muscle Tone: within normal limits Gait & Station: normal Patient leans: N/A  Psychiatric Specialty Exam: Physical Exam  Review of Systems  Psychiatric/Behavioral: Positive for depression and suicidal ideas. The patient is nervous/anxious.   All other systems reviewed and are negative.   Blood pressure 97/58, pulse 91, temperature 98.2 F (36.8 C), temperature source Oral, resp. rate 18, SpO2 98 %.There is no weight on file to calculate BMI.  General Appearance: Casual and Fairly Groomed  Engineer, water::  Good  Speech:  Clear and Coherent and Normal Rate   Volume:  Normal  Mood:  Anxious and Depressed  Affect:  Appropriate, Congruent and Depressed  Thought Process:  Circumstantial and Goal Directed  Orientation:  Full (Time, Place, and Person)  Thought Content:  WDL  Suicidal Thoughts:  Yes.  with intent/plan  Homicidal Thoughts:  No  Memory:  Immediate;   Fair Recent;   Fair Remote;   Fair  Judgement:  Fair  Insight:  Fair  Psychomotor Activity:  Normal  Concentration:  Good  Recall:  Good  Fund of Knowledge:Fair  Language: Good  Akathisia:  No  Handed:    AIMS (if indicated):     Assets:  Communication Skills Desire for Improvement Resilience Social Support  ADL's:  Intact  Cognition: WNL  Sleep:      Medical Decision Making: Review of Psycho-Social Stressors (1), Review or order clinical lab tests (1), Established Problem, Worsening (2), Review of Medication Regimen & Side Effects (2) and Review of New Medication or Change in Dosage (2)  Treatment Plan Summary: Daily contact with patient to assess and evaluate symptoms and progress in treatment and Medication management  Plan:  Recommend psychiatric Inpatient admission when medically cleared.  Disposition: Admit to inpatient psychiatric facility for safety and stabilization. Pt to go to [facility] in the AM  Benjamine Mola, FNP-Bc 04/06/2015 12:14 PM  Patient seen face-to-face for the psychiatric evaluation, case discussed with the physician extender, formulated treatment plan, and certify that patient need acute psychiatric hospitalization for crisis evaluation, safety monitoring and medication management and therapeutic interventions.Reviewed  the information documented and agree with the treatment plan.   Taron Conrey,JANARDHAHA R. 04/06/2015 5:08 PM

## 2015-04-06 NOTE — BHH Counselor (Signed)
Patient was accepted to [facility] per Chip Boer, Dr. Merlene Morse accepting  Call report to [phone] and patient can come at any time and must be IVC'd before transport per Chip Boer. Patients nurse informed.   Davina Poke, LCSW Therapeutic Triage Specialist [facility] 04/06/2015 8:31 AM'

## 2015-04-06 NOTE — ED Notes (Signed)
Pt. try to urinate but was not able to, but will try again later on in morning.

## 2015-04-07 LAB — CBG MONITORING, ED
GLUCOSE-CAPILLARY: 290 mg/dL — AB (ref 65–99)
Glucose-Capillary: 179 mg/dL — ABNORMAL HIGH (ref 65–99)
Glucose-Capillary: 93 mg/dL (ref 65–99)

## 2015-04-07 NOTE — BH Assessment (Signed)
Writer reassessed pt today. Pt is pleasant and oriented x 4. He continues to endorse SI. Pt denies HI. He denies [facility] and no delusions noted. Pt reports good appetite. He reports he slept well last night but is still tired. Writer informs pt that he most likely will be going to [facility] for inpatient treatment. Pt expressed some concern that his family in [place] wouldn't be able to visit him. Writer will keep pt informed on disposition.  Evette Cristal, [place] Therapeutic Triage Specialist

## 2015-04-07 NOTE — ED Notes (Signed)
[facility]iff[facility] called for transport to [facility] to be called once transport arrives

## 2015-04-07 NOTE — ED Notes (Signed)
Patient is resting comfortably. 

## 2015-04-07 NOTE — ED Notes (Signed)
Redressed right foot wound-healing well, no drainage, or odor noted

## 2015-06-02 ENCOUNTER — Encounter (HOSPITAL_BASED_OUTPATIENT_CLINIC_OR_DEPARTMENT_OTHER): Payer: BLUE CROSS/BLUE SHIELD | Attending: Plastic Surgery

## 2015-06-02 DIAGNOSIS — G4730 Sleep apnea, unspecified: Secondary | ICD-10-CM | POA: Diagnosis not present

## 2015-06-02 DIAGNOSIS — E1140 Type 2 diabetes mellitus with diabetic neuropathy, unspecified: Secondary | ICD-10-CM | POA: Insufficient documentation

## 2015-06-02 DIAGNOSIS — E1151 Type 2 diabetes mellitus with diabetic peripheral angiopathy without gangrene: Secondary | ICD-10-CM | POA: Diagnosis not present

## 2015-06-02 DIAGNOSIS — L97512 Non-pressure chronic ulcer of other part of right foot with fat layer exposed: Secondary | ICD-10-CM | POA: Diagnosis not present

## 2015-06-02 DIAGNOSIS — I10 Essential (primary) hypertension: Secondary | ICD-10-CM | POA: Insufficient documentation

## 2015-06-02 DIAGNOSIS — J45909 Unspecified asthma, uncomplicated: Secondary | ICD-10-CM | POA: Insufficient documentation

## 2015-06-02 DIAGNOSIS — E11621 Type 2 diabetes mellitus with foot ulcer: Secondary | ICD-10-CM | POA: Diagnosis not present

## 2015-06-02 DIAGNOSIS — L84 Corns and callosities: Secondary | ICD-10-CM | POA: Insufficient documentation

## 2015-06-02 DIAGNOSIS — M1990 Unspecified osteoarthritis, unspecified site: Secondary | ICD-10-CM | POA: Diagnosis not present

## 2015-06-04 ENCOUNTER — Telehealth: Payer: Self-pay | Admitting: Plastic Surgery

## 2015-06-04 NOTE — Telephone Encounter (Signed)
Labs obtained 06/03/15 as part of wound care. BS post prandial 423. Patient at home this am, states took BS prior to eating and in 200s. Lying down, but no nausea, vomiting, dizziness. Counseled patient he should see his PCP this week regarding this. Reported understanding. Has scheduled wound center visit.

## 2015-06-09 DIAGNOSIS — E1151 Type 2 diabetes mellitus with diabetic peripheral angiopathy without gangrene: Secondary | ICD-10-CM | POA: Diagnosis not present

## 2015-06-09 DIAGNOSIS — E11621 Type 2 diabetes mellitus with foot ulcer: Secondary | ICD-10-CM | POA: Diagnosis not present

## 2015-06-09 DIAGNOSIS — L97512 Non-pressure chronic ulcer of other part of right foot with fat layer exposed: Secondary | ICD-10-CM | POA: Diagnosis not present

## 2015-06-09 DIAGNOSIS — E1140 Type 2 diabetes mellitus with diabetic neuropathy, unspecified: Secondary | ICD-10-CM | POA: Diagnosis not present

## 2015-06-10 ENCOUNTER — Other Ambulatory Visit (HOSPITAL_COMMUNITY): Payer: Self-pay | Admitting: Plastic Surgery

## 2015-06-10 ENCOUNTER — Ambulatory Visit (HOSPITAL_COMMUNITY)
Admission: RE | Admit: 2015-06-10 | Discharge: 2015-06-10 | Disposition: A | Discharge status: Home / Self Care | Payer: BLUE CROSS/BLUE SHIELD | Source: Ambulatory Visit | Attending: Vascular Surgery | Admitting: Vascular Surgery

## 2015-06-10 DIAGNOSIS — L97521 Non-pressure chronic ulcer of other part of left foot limited to breakdown of skin: Secondary | ICD-10-CM

## 2015-06-16 DIAGNOSIS — L97512 Non-pressure chronic ulcer of other part of right foot with fat layer exposed: Secondary | ICD-10-CM | POA: Diagnosis not present

## 2015-06-16 DIAGNOSIS — E1151 Type 2 diabetes mellitus with diabetic peripheral angiopathy without gangrene: Secondary | ICD-10-CM | POA: Diagnosis not present

## 2015-06-16 DIAGNOSIS — E11621 Type 2 diabetes mellitus with foot ulcer: Secondary | ICD-10-CM | POA: Diagnosis not present

## 2015-06-16 DIAGNOSIS — E1140 Type 2 diabetes mellitus with diabetic neuropathy, unspecified: Secondary | ICD-10-CM | POA: Diagnosis not present

## 2015-06-23 DIAGNOSIS — E11621 Type 2 diabetes mellitus with foot ulcer: Secondary | ICD-10-CM | POA: Diagnosis not present

## 2015-06-23 DIAGNOSIS — L97512 Non-pressure chronic ulcer of other part of right foot with fat layer exposed: Secondary | ICD-10-CM | POA: Diagnosis not present

## 2015-06-23 DIAGNOSIS — E1151 Type 2 diabetes mellitus with diabetic peripheral angiopathy without gangrene: Secondary | ICD-10-CM | POA: Diagnosis not present

## 2015-06-23 DIAGNOSIS — E1140 Type 2 diabetes mellitus with diabetic neuropathy, unspecified: Secondary | ICD-10-CM | POA: Diagnosis not present

## 2015-06-26 ENCOUNTER — Encounter (HOSPITAL_BASED_OUTPATIENT_CLINIC_OR_DEPARTMENT_OTHER): Payer: BLUE CROSS/BLUE SHIELD | Attending: Plastic Surgery

## 2015-06-26 DIAGNOSIS — I10 Essential (primary) hypertension: Secondary | ICD-10-CM | POA: Insufficient documentation

## 2015-06-26 DIAGNOSIS — J45909 Unspecified asthma, uncomplicated: Secondary | ICD-10-CM | POA: Diagnosis not present

## 2015-06-26 DIAGNOSIS — E1151 Type 2 diabetes mellitus with diabetic peripheral angiopathy without gangrene: Secondary | ICD-10-CM | POA: Insufficient documentation

## 2015-06-26 DIAGNOSIS — M1990 Unspecified osteoarthritis, unspecified site: Secondary | ICD-10-CM | POA: Insufficient documentation

## 2015-06-26 DIAGNOSIS — E11621 Type 2 diabetes mellitus with foot ulcer: Secondary | ICD-10-CM | POA: Insufficient documentation

## 2015-06-26 DIAGNOSIS — F17200 Nicotine dependence, unspecified, uncomplicated: Secondary | ICD-10-CM | POA: Insufficient documentation

## 2015-06-26 DIAGNOSIS — E1140 Type 2 diabetes mellitus with diabetic neuropathy, unspecified: Secondary | ICD-10-CM | POA: Diagnosis not present

## 2015-06-26 DIAGNOSIS — L97512 Non-pressure chronic ulcer of other part of right foot with fat layer exposed: Secondary | ICD-10-CM | POA: Insufficient documentation

## 2015-06-30 ENCOUNTER — Encounter (HOSPITAL_BASED_OUTPATIENT_CLINIC_OR_DEPARTMENT_OTHER): Payer: BLUE CROSS/BLUE SHIELD

## 2015-06-30 DIAGNOSIS — I10 Essential (primary) hypertension: Secondary | ICD-10-CM | POA: Diagnosis not present

## 2015-06-30 DIAGNOSIS — J45909 Unspecified asthma, uncomplicated: Secondary | ICD-10-CM | POA: Diagnosis not present

## 2015-06-30 DIAGNOSIS — E11621 Type 2 diabetes mellitus with foot ulcer: Secondary | ICD-10-CM | POA: Diagnosis not present

## 2015-06-30 DIAGNOSIS — L97512 Non-pressure chronic ulcer of other part of right foot with fat layer exposed: Secondary | ICD-10-CM | POA: Diagnosis not present

## 2015-06-30 LAB — GLUCOSE, CAPILLARY: GLUCOSE-CAPILLARY: 480 mg/dL — AB (ref 65–99)

## 2015-07-02 DIAGNOSIS — E11621 Type 2 diabetes mellitus with foot ulcer: Secondary | ICD-10-CM | POA: Diagnosis not present

## 2015-07-07 DIAGNOSIS — E11621 Type 2 diabetes mellitus with foot ulcer: Secondary | ICD-10-CM | POA: Diagnosis not present

## 2015-07-07 DIAGNOSIS — L97512 Non-pressure chronic ulcer of other part of right foot with fat layer exposed: Secondary | ICD-10-CM | POA: Diagnosis not present

## 2015-07-07 DIAGNOSIS — J45909 Unspecified asthma, uncomplicated: Secondary | ICD-10-CM | POA: Diagnosis not present

## 2015-07-07 DIAGNOSIS — I10 Essential (primary) hypertension: Secondary | ICD-10-CM | POA: Diagnosis not present

## 2015-07-14 DIAGNOSIS — L97512 Non-pressure chronic ulcer of other part of right foot with fat layer exposed: Secondary | ICD-10-CM | POA: Diagnosis not present

## 2015-07-14 DIAGNOSIS — J45909 Unspecified asthma, uncomplicated: Secondary | ICD-10-CM | POA: Diagnosis not present

## 2015-07-14 DIAGNOSIS — E11621 Type 2 diabetes mellitus with foot ulcer: Secondary | ICD-10-CM | POA: Diagnosis not present

## 2015-07-14 DIAGNOSIS — I10 Essential (primary) hypertension: Secondary | ICD-10-CM | POA: Diagnosis not present

## 2015-07-23 ENCOUNTER — Other Ambulatory Visit (HOSPITAL_BASED_OUTPATIENT_CLINIC_OR_DEPARTMENT_OTHER): Payer: Self-pay | Admitting: General Surgery

## 2015-07-23 DIAGNOSIS — L97509 Non-pressure chronic ulcer of other part of unspecified foot with unspecified severity: Principal | ICD-10-CM

## 2015-07-23 DIAGNOSIS — E11621 Type 2 diabetes mellitus with foot ulcer: Secondary | ICD-10-CM

## 2015-07-23 DIAGNOSIS — J45909 Unspecified asthma, uncomplicated: Secondary | ICD-10-CM | POA: Diagnosis not present

## 2015-07-23 DIAGNOSIS — I10 Essential (primary) hypertension: Secondary | ICD-10-CM | POA: Diagnosis not present

## 2015-07-23 DIAGNOSIS — L97512 Non-pressure chronic ulcer of other part of right foot with fat layer exposed: Secondary | ICD-10-CM | POA: Diagnosis not present

## 2015-07-23 DIAGNOSIS — Z794 Long term (current) use of insulin: Principal | ICD-10-CM

## 2015-07-30 ENCOUNTER — Other Ambulatory Visit (HOSPITAL_BASED_OUTPATIENT_CLINIC_OR_DEPARTMENT_OTHER): Payer: Self-pay | Admitting: General Surgery

## 2015-07-30 ENCOUNTER — Ambulatory Visit (HOSPITAL_COMMUNITY)
Admission: RE | Admit: 2015-07-30 | Discharge: 2015-07-30 | Disposition: A | Discharge status: Home / Self Care | Payer: BLUE CROSS/BLUE SHIELD | Source: Ambulatory Visit | Attending: General Surgery | Admitting: General Surgery

## 2015-07-30 ENCOUNTER — Encounter (HOSPITAL_BASED_OUTPATIENT_CLINIC_OR_DEPARTMENT_OTHER): Payer: BLUE CROSS/BLUE SHIELD | Attending: Surgery

## 2015-07-30 DIAGNOSIS — L97512 Non-pressure chronic ulcer of other part of right foot with fat layer exposed: Secondary | ICD-10-CM | POA: Diagnosis not present

## 2015-07-30 DIAGNOSIS — I10 Essential (primary) hypertension: Secondary | ICD-10-CM | POA: Insufficient documentation

## 2015-07-30 DIAGNOSIS — F17218 Nicotine dependence, cigarettes, with other nicotine-induced disorders: Secondary | ICD-10-CM | POA: Insufficient documentation

## 2015-07-30 DIAGNOSIS — Z794 Long term (current) use of insulin: Principal | ICD-10-CM

## 2015-07-30 DIAGNOSIS — E1151 Type 2 diabetes mellitus with diabetic peripheral angiopathy without gangrene: Secondary | ICD-10-CM | POA: Diagnosis not present

## 2015-07-30 DIAGNOSIS — E11621 Type 2 diabetes mellitus with foot ulcer: Secondary | ICD-10-CM

## 2015-07-30 DIAGNOSIS — E1140 Type 2 diabetes mellitus with diabetic neuropathy, unspecified: Secondary | ICD-10-CM | POA: Insufficient documentation

## 2015-07-30 DIAGNOSIS — L97509 Non-pressure chronic ulcer of other part of unspecified foot with unspecified severity: Principal | ICD-10-CM

## 2015-07-30 DIAGNOSIS — L84 Corns and callosities: Secondary | ICD-10-CM | POA: Insufficient documentation

## 2015-07-30 DIAGNOSIS — J45909 Unspecified asthma, uncomplicated: Secondary | ICD-10-CM | POA: Insufficient documentation

## 2015-07-30 DIAGNOSIS — F17210 Nicotine dependence, cigarettes, uncomplicated: Secondary | ICD-10-CM | POA: Insufficient documentation

## 2015-07-30 DIAGNOSIS — M1990 Unspecified osteoarthritis, unspecified site: Secondary | ICD-10-CM | POA: Insufficient documentation

## 2015-07-30 DIAGNOSIS — G4730 Sleep apnea, unspecified: Secondary | ICD-10-CM | POA: Diagnosis not present

## 2015-07-31 DIAGNOSIS — G5601 Carpal tunnel syndrome, right upper limb: Secondary | ICD-10-CM | POA: Insufficient documentation

## 2015-07-31 DIAGNOSIS — G5621 Lesion of ulnar nerve, right upper limb: Secondary | ICD-10-CM | POA: Insufficient documentation

## 2015-08-04 ENCOUNTER — Ambulatory Visit (HOSPITAL_COMMUNITY): Admission: RE | Admit: 2015-08-04 | Payer: BLUE CROSS/BLUE SHIELD | Source: Ambulatory Visit

## 2015-08-06 DIAGNOSIS — E11621 Type 2 diabetes mellitus with foot ulcer: Secondary | ICD-10-CM | POA: Diagnosis not present

## 2015-08-13 ENCOUNTER — Ambulatory Visit (HOSPITAL_COMMUNITY): Payer: BLUE CROSS/BLUE SHIELD | Attending: General Surgery

## 2015-08-16 ENCOUNTER — Emergency Department (HOSPITAL_COMMUNITY)
Admission: EM | Admit: 2015-08-16 | Discharge: 2015-08-19 | Disposition: A | Discharge status: Home / Self Care | Payer: BLUE CROSS/BLUE SHIELD | Attending: Emergency Medicine | Admitting: Emergency Medicine

## 2015-08-16 ENCOUNTER — Emergency Department (HOSPITAL_COMMUNITY): Payer: BLUE CROSS/BLUE SHIELD

## 2015-08-16 ENCOUNTER — Encounter (HOSPITAL_COMMUNITY): Payer: Self-pay | Admitting: Emergency Medicine

## 2015-08-16 DIAGNOSIS — H109 Unspecified conjunctivitis: Secondary | ICD-10-CM | POA: Insufficient documentation

## 2015-08-16 DIAGNOSIS — F329 Major depressive disorder, single episode, unspecified: Secondary | ICD-10-CM

## 2015-08-16 DIAGNOSIS — Z87442 Personal history of urinary calculi: Secondary | ICD-10-CM | POA: Insufficient documentation

## 2015-08-16 DIAGNOSIS — J45901 Unspecified asthma with (acute) exacerbation: Secondary | ICD-10-CM | POA: Diagnosis not present

## 2015-08-16 DIAGNOSIS — R05 Cough: Secondary | ICD-10-CM

## 2015-08-16 DIAGNOSIS — F332 Major depressive disorder, recurrent severe without psychotic features: Secondary | ICD-10-CM | POA: Diagnosis present

## 2015-08-16 DIAGNOSIS — I10 Essential (primary) hypertension: Secondary | ICD-10-CM | POA: Insufficient documentation

## 2015-08-16 DIAGNOSIS — R0789 Other chest pain: Secondary | ICD-10-CM | POA: Insufficient documentation

## 2015-08-16 DIAGNOSIS — R45850 Homicidal ideations: Secondary | ICD-10-CM | POA: Diagnosis not present

## 2015-08-16 DIAGNOSIS — D849 Immunodeficiency, unspecified: Secondary | ICD-10-CM | POA: Insufficient documentation

## 2015-08-16 DIAGNOSIS — Z794 Long term (current) use of insulin: Secondary | ICD-10-CM | POA: Diagnosis not present

## 2015-08-16 DIAGNOSIS — Z7984 Long term (current) use of oral hypoglycemic drugs: Secondary | ICD-10-CM | POA: Diagnosis not present

## 2015-08-16 DIAGNOSIS — Z7951 Long term (current) use of inhaled steroids: Secondary | ICD-10-CM | POA: Diagnosis not present

## 2015-08-16 DIAGNOSIS — R45851 Suicidal ideations: Secondary | ICD-10-CM | POA: Diagnosis not present

## 2015-08-16 DIAGNOSIS — Z720 Tobacco use: Secondary | ICD-10-CM

## 2015-08-16 DIAGNOSIS — J069 Acute upper respiratory infection, unspecified: Secondary | ICD-10-CM | POA: Diagnosis not present

## 2015-08-16 DIAGNOSIS — K219 Gastro-esophageal reflux disease without esophagitis: Secondary | ICD-10-CM | POA: Diagnosis not present

## 2015-08-16 DIAGNOSIS — F3130 Bipolar disorder, current episode depressed, mild or moderate severity, unspecified: Secondary | ICD-10-CM | POA: Diagnosis not present

## 2015-08-16 DIAGNOSIS — L84 Corns and callosities: Secondary | ICD-10-CM

## 2015-08-16 DIAGNOSIS — Z791 Long term (current) use of non-steroidal anti-inflammatories (NSAID): Secondary | ICD-10-CM | POA: Diagnosis not present

## 2015-08-16 DIAGNOSIS — F32A Depression, unspecified: Secondary | ICD-10-CM

## 2015-08-16 DIAGNOSIS — G8929 Other chronic pain: Secondary | ICD-10-CM | POA: Diagnosis not present

## 2015-08-16 DIAGNOSIS — R0602 Shortness of breath: Secondary | ICD-10-CM

## 2015-08-16 DIAGNOSIS — Z79899 Other long term (current) drug therapy: Secondary | ICD-10-CM | POA: Diagnosis not present

## 2015-08-16 DIAGNOSIS — E119 Type 2 diabetes mellitus without complications: Secondary | ICD-10-CM | POA: Diagnosis not present

## 2015-08-16 DIAGNOSIS — M1990 Unspecified osteoarthritis, unspecified site: Secondary | ICD-10-CM | POA: Insufficient documentation

## 2015-08-16 DIAGNOSIS — F17210 Nicotine dependence, cigarettes, uncomplicated: Secondary | ICD-10-CM | POA: Insufficient documentation

## 2015-08-16 DIAGNOSIS — E1165 Type 2 diabetes mellitus with hyperglycemia: Secondary | ICD-10-CM | POA: Diagnosis not present

## 2015-08-16 DIAGNOSIS — G629 Polyneuropathy, unspecified: Secondary | ICD-10-CM | POA: Insufficient documentation

## 2015-08-16 DIAGNOSIS — R059 Cough, unspecified: Secondary | ICD-10-CM

## 2015-08-16 LAB — CBC WITH DIFFERENTIAL/PLATELET
BASOS PCT: 0 %
Basophils Absolute: 0 10*3/uL (ref 0.0–0.1)
EOS ABS: 0.1 10*3/uL (ref 0.0–0.7)
Eosinophils Relative: 1 %
HEMATOCRIT: 42.2 % (ref 39.0–52.0)
Hemoglobin: 14.6 g/dL (ref 13.0–17.0)
Lymphocytes Relative: 27 %
Lymphs Abs: 1.6 10*3/uL (ref 0.7–4.0)
MCH: 31.1 pg (ref 26.0–34.0)
MCHC: 34.6 g/dL (ref 30.0–36.0)
MCV: 90 fL (ref 78.0–100.0)
MONO ABS: 0.6 10*3/uL (ref 0.1–1.0)
MONOS PCT: 9 %
NEUTROS ABS: 3.8 10*3/uL (ref 1.7–7.7)
Neutrophils Relative %: 63 %
Platelets: 168 10*3/uL (ref 150–400)
RBC: 4.69 MIL/uL (ref 4.22–5.81)
RDW: 12.5 % (ref 11.5–15.5)
WBC: 6.1 10*3/uL (ref 4.0–10.5)

## 2015-08-16 LAB — CBG MONITORING, ED
GLUCOSE-CAPILLARY: 412 mg/dL — AB (ref 65–99)
Glucose-Capillary: 279 mg/dL — ABNORMAL HIGH (ref 65–99)
Glucose-Capillary: 394 mg/dL — ABNORMAL HIGH (ref 65–99)
Glucose-Capillary: 199 mg/dL — ABNORMAL HIGH (ref 65–99)

## 2015-08-16 LAB — BASIC METABOLIC PANEL
Anion gap: 15 (ref 5–15)
BUN: 17 mg/dL (ref 6–20)
CO2: 23 mmol/L (ref 22–32)
CREATININE: 1.16 mg/dL (ref 0.61–1.24)
Calcium: 9.4 mg/dL (ref 8.9–10.3)
Chloride: 99 mmol/L — ABNORMAL LOW (ref 101–111)
GFR calc Af Amer: >60 mL/min (ref >60)
Glucose, Bld: 399 mg/dL — ABNORMAL HIGH (ref 65–99)
Potassium: 4.3 mmol/L (ref 3.5–5.1)
SODIUM: 137 mmol/L (ref 135–145)

## 2015-08-16 LAB — I-STAT TROPONIN, ED: TROPONIN I, POC: 0 ng/mL (ref 0.00–0.08)

## 2015-08-16 LAB — I-STAT CG4 LACTIC ACID, ED: Lactic Acid, Venous: 1.21 mmol/L (ref 0.5–2.0)

## 2015-08-16 MED ORDER — SERTRALINE HCL 50 MG PO TABS
100.0000 mg | ORAL_TABLET | Freq: Every day | ORAL | Status: DC
  Administered 2015-08-16 – 2015-08-18 (×2): 100 mg via ORAL
  Filled 2015-08-16 (×2): qty 2

## 2015-08-16 MED ORDER — PANTOPRAZOLE SODIUM 40 MG PO TBEC
40.0000 mg | DELAYED_RELEASE_TABLET | Freq: Every day | ORAL | Status: DC
  Administered 2015-08-16 – 2015-08-19 (×4): 40 mg via ORAL
  Filled 2015-08-16 (×4): qty 1

## 2015-08-16 MED ORDER — MELOXICAM 15 MG PO TABS
15.0000 mg | ORAL_TABLET | Freq: Every day | ORAL | Status: DC
  Administered 2015-08-16 – 2015-08-19 (×4): 15 mg via ORAL
  Filled 2015-08-16 (×4): qty 1

## 2015-08-16 MED ORDER — ALBUTEROL SULFATE (2.5 MG/3ML) 0.083% IN NEBU
5.0000 mg | INHALATION_SOLUTION | Freq: Once | RESPIRATORY_TRACT | Status: AC
  Administered 2015-08-16: 5 mg via RESPIRATORY_TRACT
  Filled 2015-08-16: qty 6

## 2015-08-16 MED ORDER — LORAZEPAM 1 MG PO TABS: 1.0000 mg | ORAL_TABLET | Freq: Three times a day (TID) | ORAL | Status: DC | PRN

## 2015-08-16 MED ORDER — ARIPIPRAZOLE 9.75 MG/1.3ML IM SOLN
9.7500 mg | INTRAMUSCULAR | Status: DC
Start: 2015-09-13 — End: 2015-08-19

## 2015-08-16 MED ORDER — ONDANSETRON HCL 4 MG PO TABS: 4.0000 mg | ORAL_TABLET | Freq: Three times a day (TID) | ORAL | Status: DC | PRN

## 2015-08-16 MED ORDER — ALBUTEROL SULFATE HFA 108 (90 BASE) MCG/ACT IN AERS
2.0000 | INHALATION_SPRAY | Freq: Four times a day (QID) | RESPIRATORY_TRACT | Status: DC | PRN
  Administered 2015-08-18: 2 via RESPIRATORY_TRACT

## 2015-08-16 MED ORDER — ACETAMINOPHEN 325 MG PO TABS
650.0000 mg | ORAL_TABLET | ORAL | Status: DC | PRN
  Administered 2015-08-19: 650 mg via ORAL
  Filled 2015-08-16: qty 2

## 2015-08-16 MED ORDER — NICOTINE 21 MG/24HR TD PT24
21.0000 mg | MEDICATED_PATCH | Freq: Every day | TRANSDERMAL | Status: DC
  Administered 2015-08-16: 21 mg via TRANSDERMAL
  Filled 2015-08-16 (×4): qty 1

## 2015-08-16 MED ORDER — ERYTHROMYCIN 5 MG/GM OP OINT
TOPICAL_OINTMENT | Freq: Four times a day (QID) | OPHTHALMIC | Status: DC
  Administered 2015-08-16 – 2015-08-17 (×5): via OPHTHALMIC
  Administered 2015-08-18: 1 via OPHTHALMIC
  Administered 2015-08-18 – 2015-08-19 (×3): via OPHTHALMIC
  Administered 2015-08-19: 1 via OPHTHALMIC
  Filled 2015-08-16 (×2): qty 3.5

## 2015-08-16 MED ORDER — METFORMIN HCL 500 MG PO TABS
1000.0000 mg | ORAL_TABLET | Freq: Two times a day (BID) | ORAL | Status: DC
  Administered 2015-08-16 – 2015-08-19 (×5): 1000 mg via ORAL
  Filled 2015-08-16 (×8): qty 2

## 2015-08-16 MED ORDER — BUPROPION HCL ER (XL) 300 MG PO TB24
300.0000 mg | ORAL_TABLET | Freq: Every day | ORAL | Status: DC
  Administered 2015-08-16 – 2015-08-19 (×4): 300 mg via ORAL
  Filled 2015-08-16 (×4): qty 1

## 2015-08-16 MED ORDER — INSULIN ASPART 100 UNIT/ML ~~LOC~~ SOLN
0.0000 [IU] | Freq: Three times a day (TID) | SUBCUTANEOUS | Status: DC
  Administered 2015-08-16: 15 [IU] via SUBCUTANEOUS
  Administered 2015-08-17: 5 [IU] via SUBCUTANEOUS
  Administered 2015-08-17: 2 [IU] via SUBCUTANEOUS
  Administered 2015-08-17 – 2015-08-19 (×6): 3 [IU] via SUBCUTANEOUS
  Filled 2015-08-16 (×9): qty 1

## 2015-08-16 MED ORDER — BUDESONIDE-FORMOTEROL FUMARATE 80-4.5 MCG/ACT IN AERO
2.0000 | INHALATION_SPRAY | Freq: Two times a day (BID) | RESPIRATORY_TRACT | Status: DC
  Administered 2015-08-16 – 2015-08-19 (×5): 2 via RESPIRATORY_TRACT
  Filled 2015-08-16: qty 6.9

## 2015-08-16 MED ORDER — ZOLPIDEM TARTRATE 5 MG PO TABS: 5.0000 mg | ORAL_TABLET | Freq: Every evening | ORAL | Status: DC | PRN

## 2015-08-16 MED ORDER — LINAGLIPTIN 5 MG PO TABS
5.0000 mg | ORAL_TABLET | Freq: Every day | ORAL | Status: DC
  Administered 2015-08-16 – 2015-08-19 (×4): 5 mg via ORAL
  Filled 2015-08-16 (×4): qty 1

## 2015-08-16 MED ORDER — FLUTICASONE PROPIONATE 50 MCG/ACT NA SUSP
2.0000 | Freq: Every day | NASAL | Status: DC
  Administered 2015-08-17 – 2015-08-19 (×3): 2 via NASAL
  Filled 2015-08-16: qty 16

## 2015-08-16 MED ORDER — INSULIN ASPART 100 UNIT/ML ~~LOC~~ SOLN
5.0000 [IU] | Freq: Once | SUBCUTANEOUS | Status: AC
  Administered 2015-08-16: 5 [IU] via SUBCUTANEOUS
  Filled 2015-08-16: qty 1

## 2015-08-16 MED ORDER — GABAPENTIN 300 MG PO CAPS
600.0000 mg | ORAL_CAPSULE | Freq: Three times a day (TID) | ORAL | Status: DC
  Administered 2015-08-16 – 2015-08-19 (×8): 600 mg via ORAL
  Filled 2015-08-16 (×8): qty 2

## 2015-08-16 MED ORDER — ALBUTEROL SULFATE HFA 108 (90 BASE) MCG/ACT IN AERS
1.0000 | INHALATION_SPRAY | RESPIRATORY_TRACT | Status: DC | PRN
  Administered 2015-08-16: 2 via RESPIRATORY_TRACT
  Filled 2015-08-16 (×2): qty 6.7

## 2015-08-16 MED ORDER — SODIUM CHLORIDE 0.9 % IV BOLUS (SEPSIS)
1000.0000 mL | Freq: Once | INTRAVENOUS | Status: AC
  Administered 2015-08-16: 1000 mL via INTRAVENOUS

## 2015-08-16 MED ORDER — LORATADINE 10 MG PO TABS
10.0000 mg | ORAL_TABLET | Freq: Every day | ORAL | Status: DC
  Administered 2015-08-16 – 2015-08-18 (×3): 10 mg via ORAL
  Filled 2015-08-16 (×3): qty 1

## 2015-08-16 MED ORDER — INSULIN GLARGINE 100 UNIT/ML ~~LOC~~ SOLN
45.0000 [IU] | Freq: Every day | SUBCUTANEOUS | Status: DC
  Administered 2015-08-16 – 2015-08-18 (×2): 45 [IU] via SUBCUTANEOUS
  Filled 2015-08-16 (×5): qty 0.45

## 2015-08-16 MED ORDER — INSULIN ASPART 100 UNIT/ML ~~LOC~~ SOLN: 35.0000 [IU] | Freq: Three times a day (TID) | SUBCUTANEOUS | Status: DC

## 2015-08-16 MED ORDER — ALUM & MAG HYDROXIDE-SIMETH 200-200-20 MG/5ML PO SUSP
30.0000 mL | ORAL | Status: DC | PRN
  Administered 2015-08-18: 30 mL via ORAL
  Filled 2015-08-16: qty 30

## 2015-08-16 MED ORDER — EZETIMIBE 10 MG PO TABS
10.0000 mg | ORAL_TABLET | Freq: Every day | ORAL | Status: DC
  Administered 2015-08-16 – 2015-08-19 (×4): 10 mg via ORAL
  Filled 2015-08-16 (×4): qty 1

## 2015-08-16 NOTE — BH Assessment (Signed)
Assessment Note  Wayne [name] is an 40 y.o. male. Patient was brought into the ED because of chronic medical complications and expression symptoms of depression.  Patient currently endorsing suicidal thoughts with plan to jump of building. Patient reports homicidal thoughts towards his brother for stealing his money yesterday although he understand he took it so the patient would not use it to buy drugs.  Patient currently denies auditory and visual hallucinations.  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  Patient reports a his of 20 suicide attempts and 11 inpatient hospitalizations.  Patient reports a history of crack cocaine use although none in the past couple months.     This Clinical research associate consulted with Dr. Mervyn Skeeters., it is recommended to re-evaluate is the AM.    Diagnosis: Major Depression Disorder, recurrent, severe  Past Medical History:  Past Medical History  Diagnosis Date  . Depression   . Diabetes mellitus   . Hyperlipidemia   . HTN (hypertension)   . Chronic headaches   . Osteoarthritis   . Kidney stones   . Polysubstance abuse     cocaine, marijuana-quit summer 2010  . Tobacco abuse   . Asthma   . Chest pain   . Lumbar disc disease   . GERD (gastroesophageal reflux disease)   . OSA (obstructive sleep apnea)   . Peripheral neuropathy (HCC) 02/01/2011  . Bipolar 1 disorder (HCC)   . Ingrown nail 11/12/2013    Past Surgical History  Procedure Laterality Date  . Knee surgery    . Bunionectomy  06-2009    Family History:  Family History  Problem Relation Age of Onset  . Heart disease Mother   . Diabetes Father   . Hyperlipidemia Father   . Emphysema Maternal Uncle   . Asthma Sister   . Asthma Mother   . Stroke Mother   . Hypertension Father   . Alzheimer's disease Maternal Grandmother   . Depression Father   . Breast cancer Maternal Aunt   . COPD Mother     maternal un    Social  History:  reports that he has been smoking Cigarettes.  He has a 3.4 pack-year smoking history. He has quit using smokeless tobacco. He reports that he does not drink alcohol or use illicit drugs.  Additional Social History:  Alcohol / Drug Use Pain Medications: see chart Prescriptions: see chart Over the Counter: see chart History of alcohol / drug use?: Yes Longest period of sobriety (when/how long): couple months Substance #1 Name of Substance 1: Crack  1 - Last Use / Amount: couple months  CIWA: CIWA-Ar BP: 115/80 mmHg Pulse Rate: 106 COWS:    Allergies:  Allergies  Allergen Reactions  . Lipitor [Atorvastatin] Other (See Comments)    All statins. Muscle weakness.   . Nystatin Other (See Comments)    Muscle weakness  . Statins Other (See Comments)    Muscle weakness, stomach cramps  . Iodine Rash    Home Medications:  (Not in a hospital admission)  OB/GYN Status:  No LMP for male patient.  General Assessment Data Location of Assessment: [facility] ED TTS Assessment: In system Is this a Tele or Face-to-Face Assessment?: Face-to-Face Is this an Initial Assessment or a Re-assessment for this encounter?: Initial Assessment Marital status: Single Is patient pregnant?: No Pregnancy Status: No Living Arrangements: Parent (mother) Can pt return to current living arrangement?: Yes Admission Status: Voluntary Is  patient capable of signing voluntary admission?: Yes Referral Source: Self/Family/Friend Insurance type: BCBS  Medical Screening Exam [facility] Walk-in ONLY) Medical Exam completed: Yes  Crisis Care Plan Living Arrangements: Parent (mother) Name of Psychiatrist: [facility] Name of Therapist: [facility]  Education Status Is patient currently in school?: No Current Grade: na Highest grade of school patient has completed: AA degrees  Risk to self with the past 6 months Suicidal Ideation: Yes-Currently Present Has patient been a risk to self within the past 6 months prior to  admission? : No Suicidal Intent: Yes-Currently Present Has patient had any suicidal intent within the past 6 months prior to admission? : No Is patient at risk for suicide?: Yes Suicidal Plan?: Yes-Currently Present Has patient had any suicidal plan within the past 6 months prior to admission? : Yes Specify Current Suicidal Plan: jump off a building Access to Means: Yes Specify Access to Suicidal Means: access to building What has been your use of drugs/alcohol within the last 12 months?: history of crack cocaine use Previous Attempts/Gestures: Yes How many times?: 20 Triggers for Past Attempts: Family contact, Unpredictable, Other personal contacts (SA) Intentional Self Injurious Behavior: None Family Suicide History: No Recent stressful life event(s): Conflict (Comment), Loss (Comment), Financial Problems, Other (Comment) Persecutory voices/beliefs?: No Depression: Yes Depression Symptoms: Loss of interest in usual pleasures, Guilt, Feeling angry/irritable, Feeling worthless/self pity (hopelessness) Substance abuse history and/or treatment for substance abuse?: Yes  Risk to Others within the past 6 months Homicidal Ideation: Yes-Currently Present Does patient have any lifetime risk of violence toward others beyond the six months prior to admission? : No Thoughts of Harm to Others: Yes-Currently Present Comment - Thoughts of Harm to Others: brother Current Homicidal Intent: No-Not Currently/Within Last 6 Months Current Homicidal Plan: No-Not Currently/Within Last 6 Months Access to Homicidal Means: No Identified Victim: brother History of harm to others?: No Assessment of Violence: None Noted Violent Behavior Description: none Does patient have access to weapons?: No Criminal Charges Pending?: No Does patient have a court date: No Is patient on probation?: No  Psychosis Hallucinations: None noted Delusions: None noted  Mental Status Report Appearance/Hygiene: In hospital  gown Eye Contact: Fair Motor Activity: Freedom of movement Speech: Slow, Slurred Level of Consciousness: Sleeping Mood: Depressed Affect: Depressed Anxiety Level: None Thought Processes: Relevant Judgement: Unimpaired Orientation: Person, Place, Situation Obsessive Compulsive Thoughts/Behaviors: None  Cognitive Functioning Concentration: Fair Memory: Recent Intact, Remote Intact IQ: Average Insight: Fair Impulse Control: Fair Appetite: Fair Weight Loss: 0 Weight Gain: 0 Sleep: No Change Total Hours of Sleep: 5  ADLScreening [facility] Assessment Services) Patient's cognitive ability adequate to safely complete daily activities?: Yes Patient able to express need for assistance with ADLs?: Yes Independently performs ADLs?: Yes (appropriate for developmental age)  Prior Inpatient Therapy Prior Inpatient Therapy: Yes Prior Therapy Dates: multiple dates Prior Therapy Facilty/Provider(s): multiple facilites Reason for Treatment: [facility]  Prior Outpatient Therapy Prior Outpatient Therapy: Yes Prior Therapy Dates: currently Prior Therapy Facilty/Provider(s): [facility] Reason for Treatment: Depression Does patient have an ACCT team?: Yes ([facility]) Does patient have Intensive In-House Services?  : No Does patient have [facility] services? : No Does patient have P4CC services?: No  ADL Screening (condition at time of admission) Patient's cognitive ability adequate to safely complete daily activities?: Yes Patient able to express need for assistance with ADLs?: Yes Independently performs ADLs?: Yes (appropriate for developmental age)       Abuse/Neglect Assessment (Assessment to be complete while patient is alone) Physical Abuse: Denies Verbal  Abuse: Denies Sexual Abuse: Denies Exploitation of patient/patient's resources: Denies Self-Neglect: Denies Values / Beliefs Cultural Requests During Hospitalization: None Spiritual Requests During Hospitalization: None Consults Spiritual Care  Consult Needed: No Social Work Consult Needed: No Merchant navy officer (For Healthcare) Does patient have an advance directive?: No Would patient like information on creating an advanced directive?: No - patient declined information    Additional Information 1:1 In Past 12 Months?: No CIRT Risk: No Elopement Risk: No Does patient have medical clearance?: Yes     Disposition:  Disposition Initial Assessment Completed for this Encounter: Yes Disposition of Patient: Other dispositions (Re-evaulate in the AM) Other disposition(s): Other (Comment) (re-evaluate)  On Site Evaluation by:   Reviewed with Physician:    Maryelizabeth Rowan A 08/16/2015 12:37 PM

## 2015-08-16 NOTE — ED Notes (Signed)
Ortho tech called by operator

## 2015-08-16 NOTE — ED Notes (Signed)
Food given to patient.  

## 2015-08-16 NOTE — ED Notes (Signed)
Applied Allevyn to wound

## 2015-08-16 NOTE — ED Notes (Signed)
CBG [id number] 

## 2015-08-16 NOTE — ED Notes (Signed)
Quinton, our ortho. Tech. Is here and is in the process of bi-valving and removing right lower leg cast.

## 2015-08-16 NOTE — ED Notes (Signed)
Pt reports generalized weakness and feeling of sob for since yesterday. Hx of asthma, no audible wheezing, speaking in complete sentences. Pt also reports eye redness. Also having a productive cough.

## 2015-08-16 NOTE — ED Provider Notes (Signed)
CSN: [id number]     Arrival date & time 08/16/15  2947 History   First MD Initiated Contact with Patient 08/16/15 [id number]     Chief Complaint  Patient presents with  . Cough  . Weakness     (Consider location/radiation/quality/duration/timing/severity/associated sxs/prior Treatment) HPI Comments: Wayne [name] is a 40 y.o. male with a PMHx of depression, DM2, HLD, HTN, chronic headaches, kidney stones, polysubstance abuse, tobacco abuse, asthma, chronic back pain, OSA, periph neuropathy, and bipolar 1 disorder, who presents to the ED with multiple complaints. His primary complaint is one day of cough with clear sputum production, shortness of breath, and generalized weakness/fatigue. He states he tried [place] with no improvement. No known aggravating factors. He also complains of associated wheezing, left eye itching and redness, clear rhinorrhea, and his blood sugars running higher than normal in the 315-418 range. Additionally he states he also has 6/10 constant centralized sore chest pain, nonradiating, with no known draining factors, and unrelieved with Hall's. He is a smoker. Positive family history of MI in his mother. States he's been taking his insulin like he's supposed to.   He denies fevers/chills, sore throat, eye drainage, ear pain or drainage, eye pain, vision changes, hemoptysis, recent travel/surgery/immobilization, history of DVT/PE, diaphoresis, lightheadedness, leg swelling, abdominal pain, nausea, vomiting, diarrhea, constipation, dysuria, hematuria, new numbness or tingling, or focal weakness. 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  When pt was about to be discharged, he states he wants to "talk to someone about his depression". Denies SI but states he has thoughts of harming his brother. Has had these thoughts before,  but still ongoing. Has been seen here for these same complaints in the past. Denies AVH.   Patient is a 40 y.o. male presenting with cough and weakness. The history is provided by the patient and medical records. No language interpreter was used.  Cough Cough characteristics:  Productive Sputum characteristics:  Clear Severity:  Mild Onset quality:  Gradual Duration:  1 day Timing:  Constant Progression:  Unchanged Chronicity:  New Smoker: yes   Context: upper respiratory infection   Relieved by:  Nothing Worsened by:  Nothing tried Ineffective treatments: halls. Associated symptoms: chest pain, rhinorrhea, shortness of breath and wheezing   Associated symptoms: no chills, no diaphoresis, no ear pain, no eye discharge, no fever, no myalgias and no sore throat   Weakness Associated symptoms include chest pain, coughing and fatigue. Pertinent negatives include no abdominal pain, arthralgias, chills, diaphoresis, fever, myalgias, nausea, numbness, sore throat, vomiting or weakness.    Past Medical History  Diagnosis Date  . Depression   . Diabetes mellitus   . Hyperlipidemia   . HTN (hypertension)   . Chronic headaches   . Osteoarthritis   . Kidney stones   . Polysubstance abuse     cocaine, marijuana-quit summer 2010  . Tobacco abuse   . Asthma   . Chest pain   . Lumbar disc disease   . GERD (gastroesophageal reflux disease)   . OSA (obstructive sleep apnea)   . Peripheral neuropathy ([place]) 02/01/2011  . Bipolar 1 disorder ([place])   . Ingrown nail 11/12/2013   Past Surgical History  Procedure Laterality Date  . Knee surgery    . Bunionectomy  06-2009   Family History  Problem Relation Age  of Onset  . Heart disease Mother   . Diabetes Father   . Hyperlipidemia Father   . Emphysema Maternal Uncle   . Asthma Sister   . Asthma Mother   . Stroke Mother   . Hypertension Father   . Alzheimer's disease Maternal Grandmother   . Depression Father   . Breast cancer Maternal  Aunt   . COPD Mother     maternal un   Social History  Substance Use Topics  . Smoking status: Current Some Day Smoker -- 0.20 packs/day for 17 years    Types: Cigarettes    Last Attempt to Quit: 02/24/2011  . Smokeless tobacco: Former Systems developer  . Alcohol Use: No     Comment: socially    Review of Systems  Constitutional: Positive for fatigue. Negative for fever, chills and diaphoresis.  HENT: Positive for rhinorrhea. Negative for ear discharge, ear pain and sore throat.   Eyes: Positive for redness and itching. Negative for pain, discharge and visual disturbance.  Respiratory: Positive for cough, shortness of breath and wheezing.   Cardiovascular: Positive for chest pain. Negative for leg swelling.  Gastrointestinal: Negative for nausea, vomiting, abdominal pain, diarrhea and constipation.  Genitourinary: Negative for dysuria and hematuria.  Musculoskeletal: Negative for myalgias and arthralgias.  Skin: Negative for color change.  Allergic/Immunologic: Positive for immunocompromised state (diabetic).  Neurological: Negative for weakness, light-headedness and numbness.  Psychiatric/Behavioral: Negative for suicidal ideas and confusion.       +HI- wants to hurt his brother Denies AVH   10 Systems reviewed and are negative for acute change except as noted in the HPI.    Allergies  Ibuprofen; Iodine; Lipitor; Nystatin; and Statins  Home Medications   Prior to Admission medications   Medication Sig Start Date End Date Taking? Authorizing Provider  albuterol (PROVENTIL HFA;VENTOLIN HFA) 108 (90 BASE) MCG/ACT inhaler Inhale 2 puffs into the lungs every 6 (six) hours as needed for wheezing (wheezing). For shortness of breath 01/09/15   Elmyra Ricks Pisciotta, PA-C  ARIPiprazole (ABILIFY) 10 MG tablet Take 1 tablet (10 mg total) by mouth 2 (two) times daily. Patient not taking: Reported on 04/05/2015 08/09/14   Niel Hummer, NP  ARIPiprazole (ABILIFY) 9.75 MG/1.3ML injection Inject 9.75 mg  into the muscle once.    Historical Provider, MD  Blood Glucose Monitoring Suppl (TRUE METRIX METER) W/DEVICE KIT 1 Device by Does not apply route as needed. 01/09/15   Debby Freiberg, MD  budesonide-formoterol [facility]) 80-4.5 MCG/ACT inhaler Inhale 2 puffs into the lungs 2 (two) times daily. Patient taking differently: Inhale 2 puffs into the lungs 2 (two) times daily as needed (for wheezing).  01/09/15   Nicole Pisciotta, PA-C  buPROPion (WELLBUTRIN XL) 300 MG 24 hr tablet Take 1 tablet (300 mg total) by mouth daily. 08/09/14   Niel Hummer, NP  ezetimibe (ZETIA) 10 MG tablet Take 1 tablet (10 mg total) by mouth daily. 01/15/15   Arnoldo Morale, MD  fluticasone (FLONASE) 50 MCG/ACT nasal spray Place 2 sprays into both nostrils daily. Patient taking differently: Place 2 sprays into both nostrils daily as needed for allergies.  08/09/14   Niel Hummer, NP  gabapentin (NEURONTIN) 300 MG capsule Take 2 capsules (600 mg total) by mouth 3 (three) times daily. 01/15/15   Arnoldo Morale, MD  glucose blood test strip Use as instructed 01/09/15   Debby Freiberg, MD  glucose monitoring kit (FREESTYLE) monitoring kit 1 each by Does not apply route as needed for other. For daily  blood glucose monitoring related to diabetes. 01/09/15   Nicole Pisciotta, PA-C  insulin aspart (NOVOLOG) 100 UNIT/ML injection Inject 10 Units into the skin 3 (three) times daily before meals. 02/05/15   Arnoldo Morale, MD  insulin glargine (LANTUS) 100 UNIT/ML injection Inject 0.45 mLs (45 Units total) into the skin at bedtime. 01/09/15 03/29/16  Monico Blitz, PA-C  Lancet Devices MISC Use PRN for blood glucose monitoring 01/09/15   Debby Freiberg, MD  loratadine (CLARITIN) 10 MG tablet Take 1 tablet (10 mg total) by mouth daily. Patient taking differently: Take 10 mg by mouth daily as needed for allergies.  08/09/14   Niel Hummer, NP  meloxicam (MOBIC) 15 MG tablet Take 15 mg by mouth daily.    Historical Provider, MD  metFORMIN (GLUCOPHAGE)  1000 MG tablet Take 1 tablet (1,000 mg total) by mouth 2 (two) times daily with a meal. 01/15/15   Arnoldo Morale, MD  methocarbamol (ROBAXIN) 500 MG tablet Take 1 tablet (500 mg total) by mouth every 8 (eight) hours as needed for muscle spasms (muscle spasms). Patient not taking: Reported on 04/05/2015 01/15/15   Arnoldo Morale, MD  Multiple Vitamin (MULTIVITAMIN WITH MINERALS) TABS tablet Take 1 tablet by mouth daily. Patient not taking: Reported on 01/06/2015 08/09/14   Niel Hummer, NP  omeprazole (PRILOSEC) 40 MG capsule Take 40 mg by mouth daily.    Historical Provider, MD  saxagliptin HCl (ONGLYZA) 5 MG TABS tablet Take 1 tablet (5 mg total) by mouth daily. 01/15/15   Arnoldo Morale, MD  sertraline (ZOLOFT) 100 MG tablet Take 1 tablet (100 mg total) by mouth at bedtime. 08/09/14   Niel Hummer, NP  solifenacin (VESICARE) 5 MG tablet Take 1 tablet (5 mg total) by mouth daily. 02/03/15   Arnoldo Morale, MD  traMADol (ULTRAM) 50 MG tablet Take 1 tablet by mouth every 6 (six) hours as needed. pain 11/18/14   Historical Provider, MD  zolpidem (AMBIEN) 5 MG tablet Take 1 tablet (5 mg total) by mouth at bedtime as needed for sleep. 08/09/14   Niel Hummer, NP   BP 99/68 mmHg  Pulse 107  Temp(Src) 97.7 F (36.5 C) (Oral)  Resp 16  SpO2 99% Recheck HR at 9:35AM (during exam): Pulse 96 Physical Exam  Constitutional: He is oriented to person, place, and time. Vital signs are normal. He appears well-developed and well-nourished.  Non-toxic appearance. No distress.  Afebrile, nontoxic, NAD, malodorous and unkempt. BP 99/68 consistent with all prior visits. HR 97 during my exam  HENT:  Head: Normocephalic and atraumatic.  Nose: Mucosal edema and rhinorrhea present.  Mouth/Throat: Uvula is midline, oropharynx is clear and moist and mucous membranes are normal. No trismus in the jaw. No uvula swelling.  Nose with mild mucosal edema and clear rhinorrhea. Oropharynx clear and moist, without uvular swelling or  deviation, no trismus or drooling, no tonsillar swelling or erythema, no exudates.    Eyes: EOM are normal. Pupils are equal, round, and reactive to light. Right eye exhibits no discharge. Left eye exhibits no discharge. Left conjunctiva is injected.  L conjunctiva injected without discharge PERRL, EOMI, no nystagmus, no visual field deficits   Neck: Normal range of motion. Neck supple.  Cardiovascular: Normal rate, regular rhythm, normal heart sounds and intact distal pulses.  Exam reveals no gallop and no friction rub.   No murmur heard. HR 97 during exam, RRR, nl s1/s2, no m/r/g, distal pulses intact (although unable to assess in R lower leg due  to cast), no pedal edema of L leg  Pulmonary/Chest: Effort normal and breath sounds normal. No respiratory distress. He has no decreased breath sounds. He has no wheezes. He has no rhonchi. He has no rales. He exhibits no tenderness, no crepitus, no deformity and no retraction.  CTAB in all lung fields, no w/r/r, no hypoxia or increased WOB, speaking in full sentences, SpO2 99-100% on RA  Chest wall nonTTP without crepitus or deformity, no retractions  Abdominal: Soft. Normal appearance and bowel sounds are normal. He exhibits no distension. There is no tenderness. There is no rigidity, no rebound, no guarding, no CVA tenderness, no tenderness at McBurney's point and negative Murphy's sign.  Musculoskeletal: Normal range of motion.  R lower leg in cast, some dried crusted blood noted to bottom of casting material, no purulent drainage noted externally on cast MAE x4 Strength and sensation grossly intact Distal pulses intact (unable to assess in RLE) No pedal edema in left leg  Once cast removed, RLE pulses intact, no pedal edema noted  Neurological: He is alert and oriented to person, place, and time. He has normal strength. No sensory deficit.  Skin: Skin is warm and dry. No rash noted.  R foot wound appears well healing, granulation tissue noted,  no erythema or drainage, wound bed clean.  Psychiatric: He has a normal mood and affect.  Nursing note and vitals reviewed.   ED Course  Procedures (including critical care time) Labs Review Labs Reviewed  BASIC METABOLIC PANEL - Abnormal; Notable for the following:    Chloride 99 (*)    Glucose, Bld 399 (*)    All other components within normal limits  CBC WITH DIFFERENTIAL/PLATELET  I-STAT TROPOININ, ED  I-STAT CG4 LACTIC ACID, ED    Imaging Review Dg Chest 2 View  08/16/2015  CLINICAL DATA:  40 year old male with shortness of breath, productive cough and generalized weakness EXAM: CHEST  2 VIEW COMPARISON:  Prior chest x-ray 01/24/2015 FINDINGS: The lungs are clear and negative for focal airspace consolidation, pulmonary edema or suspicious pulmonary nodule. No pleural effusion or pneumothorax. Cardiac and mediastinal contours are within normal limits. No acute fracture or lytic or blastic osseous lesions. The visualized upper abdominal bowel gas pattern is unremarkable. IMPRESSION: Negative chest x-ray. Electronically Signed   By: Jacqulynn Cadet M.D.   On: 08/16/2015 11:02   I have personally reviewed and evaluated these images and lab results as part of my medical decision-making.   EKG Interpretation   Date/Time:  Saturday August 16 2015 10:51:14 EST Ventricular Rate:  109 PR Interval:  163 QRS Duration: 92 QT Interval:  343 QTC Calculation: 462 R Axis:   81 Text Interpretation:  Sinus tachycardia Premature ventricular complexes  Non-specific ST-t changes Confirmed by RAY MD, Andee Poles ([id number]) on  08/16/2015 11:38:09 AM      MDM   Final diagnoses:  Cough  Conjunctivitis of left eye  SOB (shortness of breath)  Atypical chest pain  URI (upper respiratory infection)  Type 2 diabetes mellitus with hyperglycemia, with long-term current use of insulin (HCC)  Tobacco use  Asthma exacerbation  Callus of foot  Depression  Homicidal ideation    40 y.o. male here  with multiple complaints. Primary complaint is cough/rhinorrhea and SOB that started yesterday, +smoker with hx of asthma. No wheezing and clear lung exam, but wants inhaler dose. On my exam, HR 97, no tachycardia or hypoxia, PERC neg, doubt PE. Also complains of generalized weakness, L eye itching/redness, hyperglycemia despite  home insulin, and central CP. Additionally reports that he didn't get his wound care done last week on his R foot, cast boot in place, will attempt to see if we can remove this and replace it to visualize the wound. Will get labs, EKG, CXR, lactic acid level, U/A, and give albuterol and fluids. Will reassess shortly.   11:34 AM Cast removed, wound well healing to R foot, no drainage or erythema, no concern of deep space infection. Lactic WNL. Trop neg. EKG with some nonspecific ST-T changes similar to prior, PVC noted but when compared to prior no significant changes found. BMP with gluc 399 without anion gap. Will give home insulin now. CBC w/diff unremarkable. U/A still not obtained, but no urinary complaints and no evidence of DKA on labs, doubt this is necessary. CXR clear. CP and SOB improved after inhaler. Pt doesn't have one at home, and can't afford it, will send one down to send home. Will also send down erythromycin ointment for his L eye conjunctivitis to cover for bacterial causes. When I was discussing that we would be discharging pt home, he states he wants to talk to someone about his depression. States he is having thoughts of harming his brother, which is a chronic thought but he is currently still thinking about it. Denies SI or AVH. Will get TTS consult, but hold on ordering other psych lab work since some of this seems chronic, chart review reveals he's had all of these similar complaints in the past. Will await to see what TTS states. Will monitor.   1:19 PM TTS stating that he will need obs overnight and reassess in AM. Pt otherwise medically cleared, doubt need  for other labs. He has the medications he would need when he's discharged (erythromycin and albuterol inhaler) sent down from pharmacy here. He knows to f/up on Monday with his PCP for his symptoms. Please see TTS notes for further documentation of care.  BP 115/80 mmHg  Pulse 106  Temp(Src) 97.7 F (36.5 C) (Oral)  Resp 12  SpO2 99%  Meds ordered this encounter  Medications  . sodium chloride 0.9 % bolus 1,000 mL    Sig:   . albuterol (PROVENTIL) (2.5 MG/3ML) 0.083% nebulizer solution 5 mg    Sig:   . insulin aspart (novoLOG) injection 5 Units    Sig:   . albuterol (PROVENTIL HFA;VENTOLIN HFA) 108 (90 Base) MCG/ACT inhaler 1-2 puff    Sig:   . erythromycin ophthalmic ointment    Sig:   . insulin glargine (LANTUS) injection 45 Units    Sig:   . albuterol (PROVENTIL HFA;VENTOLIN HFA) 108 (90 Base) MCG/ACT inhaler 2 puff    Sig:   . ARIPiprazole (ABILIFY) injection 9.75 mg    Sig:   . budesonide-formoterol (SYMBICORT) 80-4.5 MCG/ACT inhaler 2 puff    Sig:   . buPROPion (WELLBUTRIN XL) 24 hr tablet 300 mg    Sig:   . ezetimibe (ZETIA) tablet 10 mg    Sig:   . fluticasone (FLONASE) 50 MCG/ACT nasal spray 2 spray    Sig:   . gabapentin (NEURONTIN) capsule 600 mg    Sig:   . insulin aspart (novoLOG) injection 35-45 Units    Sig:   . loratadine (CLARITIN) tablet 10 mg    Sig:   . meloxicam (MOBIC) tablet 15 mg    Sig:   . metFORMIN (GLUCOPHAGE) tablet 1,000 mg    Sig:   . pantoprazole (PROTONIX) EC tablet 40 mg  Sig:   . linagliptin (TRADJENTA) tablet 5 mg    Sig:   . sertraline (ZOLOFT) tablet 100 mg    Sig:   . zolpidem (AMBIEN) tablet 5 mg    Sig:   . alum & mag hydroxide-simeth (MAALOX/MYLANTA) 200-200-20 MG/5ML suspension 30 mL    Sig:   . ondansetron (ZOFRAN) tablet 4 mg    Sig:   . nicotine (NICODERM CQ - dosed in mg/24 hours) patch 21 mg    Sig:   . acetaminophen (TYLENOL) tablet 650 mg    Sig:   . LORazepam (ATIVAN) tablet 1 mg    Sig:       Adlene Adduci Camprubi-Soms, PA-C 08/16/15 1320  Pattricia Boss, MD 08/17/15 [phone]

## 2015-08-17 DIAGNOSIS — R45851 Suicidal ideations: Secondary | ICD-10-CM

## 2015-08-17 DIAGNOSIS — F332 Major depressive disorder, recurrent severe without psychotic features: Secondary | ICD-10-CM

## 2015-08-17 LAB — CBG MONITORING, ED
GLUCOSE-CAPILLARY: 246 mg/dL — AB (ref 65–99)
GLUCOSE-CAPILLARY: 190 mg/dL — AB (ref 65–99)
Glucose-Capillary: 88 mg/dL (ref 65–99)
Glucose-Capillary: 168 mg/dL — ABNORMAL HIGH (ref 65–99)
Glucose-Capillary: 126 mg/dL — ABNORMAL HIGH (ref 65–99)

## 2015-08-17 MED ORDER — LOPERAMIDE HCL 2 MG PO CAPS
2.0000 mg | ORAL_CAPSULE | ORAL | Status: DC | PRN
  Administered 2015-08-17 – 2015-08-18 (×4): 2 mg via ORAL
  Filled 2015-08-17 (×4): qty 1

## 2015-08-17 NOTE — ED Notes (Signed)
I change his right foot dressing at this time, placing  A 3 x 3 Mepilex pad, over which I wrap with 3" Kling.  He has a small open area at plantar aspect of mid #1 m.t.p. Joint.  He is in no distress and his only request at this time is for a drink, which we provide for him.

## 2015-08-17 NOTE — ED Notes (Signed)
Bed: WA03 Expected date:  Expected time:  Means of arrival:  Comments: 
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
## 2015-08-17 NOTE — ED Notes (Signed)
Pt sleeping will do VS when pt wakes up

## 2015-08-17 NOTE — ED Notes (Signed)
He remains calm, cooperative and in no distress.  He reports no difficulties, including respiratory and his b.b.s. Are clear.

## 2015-08-17 NOTE — ED Provider Notes (Signed)
Patient is complaining of diarrhea. Multiple loose, watery stools. No blood. Patient has been placed on metformin and states that whenever he does this he has diarrhea. Denies abdominal pain, appears well. Denies recent antibiotics. Will place on Imodium.  Pricilla Loveless, MD 08/17/15 1556

## 2015-08-17 NOTE — ED Notes (Signed)
His relative has just brought me his home Tresiba pens, which I take to pharmacy at this time to be checked in for issuing while his is hospitalized.

## 2015-08-17 NOTE — Progress Notes (Signed)
Disposition CSW completed patient referrals to the following inpatient psych facilities:  [facility]  CSW will continue to follow patient for placement needs.  Seward Speck [facility] Disposition CSW [phone]

## 2015-08-17 NOTE — ED Notes (Signed)
He arouses easily from sleep and is oriented x 4 with clear speech.  He thanks [place] for our care and is pleasant and cooperative.

## 2015-08-18 DIAGNOSIS — R45851 Suicidal ideations: Secondary | ICD-10-CM | POA: Diagnosis not present

## 2015-08-18 DIAGNOSIS — F332 Major depressive disorder, recurrent severe without psychotic features: Secondary | ICD-10-CM | POA: Diagnosis not present

## 2015-08-18 LAB — CBG MONITORING, ED
GLUCOSE-CAPILLARY: 187 mg/dL — AB (ref 65–99)
Glucose-Capillary: 214 mg/dL — ABNORMAL HIGH (ref 65–99)
Glucose-Capillary: 189 mg/dL — ABNORMAL HIGH (ref 65–99)
Glucose-Capillary: 176 mg/dL — ABNORMAL HIGH (ref 65–99)

## 2015-08-18 NOTE — BH Assessment (Signed)
[facility] Assessment Progress Note  Per Thedore Mins, MD, this pt requires psychiatric hospitalization at this time.  Pt's ACT Team ([facility]) calls to inquire about pt's status.  After having pt sign Consent to Release Information to them, I called them back and left a voice message informing them that placement is being sought.  The following facilities have been contacted to seek placement for this pt, with results as noted:  Beds available, information sent, decision pending:  [facility] [place]   At capacity:  [facility]   Doylene Canning, [place] Triage Specialist [phone]

## 2015-08-18 NOTE — Clinical Social Work Note (Signed)
Victorino Dike from [facility] ACT TEam left VM, says pt is current w their services.  Requests update [phone]).  Santa Genera, LCSW Lead Clinical Social Worker Phone:  [phone]

## 2015-08-18 NOTE — ED Notes (Signed)
CALLED ICU FOR HYDROCOLLOID DRESSING. DRESSING AT BEDSIDE.

## 2015-08-18 NOTE — ED Notes (Signed)
Tech took blood sugar reported 214 at 7:10am
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
## 2015-08-19 ENCOUNTER — Emergency Department (HOSPITAL_COMMUNITY): Payer: BLUE CROSS/BLUE SHIELD

## 2015-08-19 LAB — URINALYSIS, ROUTINE W REFLEX MICROSCOPIC
Bilirubin Urine: NEGATIVE
Glucose, UA: 500 mg/dL — AB
Hgb urine dipstick: NEGATIVE
KETONES UR: NEGATIVE mg/dL
LEUKOCYTES UA: NEGATIVE
NITRITE: NEGATIVE
PROTEIN: NEGATIVE mg/dL
Specific Gravity, Urine: 1.017 (ref 1.005–1.030)
pH: 5 (ref 5.0–8.0)

## 2015-08-19 LAB — CBG MONITORING, ED
GLUCOSE-CAPILLARY: 159 mg/dL — AB (ref 65–99)
GLUCOSE-CAPILLARY: 158 mg/dL — AB (ref 65–99)

## 2015-08-19 MED ORDER — SULFAMETHOXAZOLE-TRIMETHOPRIM 800-160 MG PO TABS: 1.0000 | ORAL_TABLET | Freq: Two times a day (BID) | ORAL | Status: AC

## 2015-08-19 MED ORDER — TOBRAMYCIN 0.3 % OP SOLN: 1.0000 [drp] | OPHTHALMIC | Status: DC

## 2015-08-19 MED ORDER — ACETAMINOPHEN 500 MG PO TABS: 1000.0000 mg | ORAL_TABLET | Freq: Once | ORAL | Status: DC

## 2015-08-19 NOTE — ED Notes (Signed)
Pt states he does not want to go home.

## 2015-08-19 NOTE — ED Notes (Signed)
Pt is discharged. Waiting to hear from pharmacy about his insulin that he brought from home.

## 2015-08-19 NOTE — ED Notes (Signed)
EDP states he spoke with patient. Pt is calling for ride home.

## 2015-08-19 NOTE — ED Notes (Signed)
Patient has had few episodes of small amounts of diarrhea.  PRN Imodium will be given.

## 2015-08-19 NOTE — Consult Note (Signed)
Psychiatric Specialty Exam: Physical Exam  ROS  Blood pressure 142/80, pulse 94, temperature 98.8 F (37.1 C), temperature source Oral, resp. rate 18, SpO2 96 %.There is no weight on file to calculate BMI.  General Appearance: Casual  Eye Contact:: Good  Speech: Clear and Coherent  Volume: Normal  Mood: Depressed  Affect: Congruent, Depressed and Flat  Thought Process: Coherent, Goal Directed and Intact  Orientation: Full (Time, Place, and Person)  Thought Content: WDL  Suicidal Thoughts:Denies, no  Homicidal Thoughts: No  Memory: Immediate; Fair Recent; Fair Remote; Fair  Judgement: Fair  Insight: Good  Psychomotor Activity: Psychomotor Retardation  Concentration: Fair  Recall: NA  Fund of Knowledge:Fair  Language: Good  Akathisia: No  Handed: Right  AIMS (if indicated):    Assets: Desire for Improvement  ADL's: Intact  Cognition: WNL         Patient was evaluated today and denies feeling suicidal.  He reports that his depression is improving and that he is taking his medications.  He reports good sleep and appetite.    He will continue to receive care from his ACT team. He  Recognizes that he will be dealing with his medical issues by taking his medications and seeing his doctor.   He denies SI/HI/AVH and he is discharged home.  Major depressive disorder, recurrent, severe without psychotic features ([facility])   Plan:  Psychiatrically cleared  Dahlia Byes   PMHNP-BC Patient seen face-to-face for psychiatric evaluation, chart reviewed and case discussed with the physician extender and developed treatment plan. Reviewed the information documented and agree with the treatment plan. Thedore Mins, MD

## 2015-08-19 NOTE — Discharge Instructions (Signed)
Use inhaler as directed for your cough/shortness of breath. Use erythromycin ointment as directed for your conjunctivitis. Use home medications as needed for pain. Take your regular medications as you're directed to take them, check your sugar regularly and take insulin as prescribed. Follow up with your regular doctor on Monday for ongoing management of symptoms, and recheck of your foot ulcer. Return to the ER for changes or worsening symptoms.   Asthma, Adult Asthma is a recurring condition in which the airways tighten and narrow. Asthma can make it difficult to breathe. It can cause coughing, wheezing, and shortness of breath. Asthma episodes, also called asthma attacks, range from minor to life-threatening. Asthma cannot be cured, but medicines and lifestyle changes can help control it. CAUSES Asthma is believed to be caused by inherited (genetic) and environmental factors, but its exact cause is unknown. Asthma may be triggered by allergens, lung infections, or irritants in the air. Asthma triggers are different for each person. Common triggers include:   Animal dander.  Dust mites.  Cockroaches.  Pollen from trees or grass.  Mold.  Smoke.  Air pollutants such as dust, household cleaners, hair sprays, aerosol sprays, paint fumes, strong chemicals, or strong odors.  Cold air, weather changes, and winds (which increase molds and pollens in the air).  Strong emotional expressions such as crying or laughing hard.  Stress.  Certain medicines (such as aspirin) or types of drugs (such as beta-blockers).  Sulfites in foods and drinks. Foods and drinks that may contain sulfites include dried fruit, potato chips, and sparkling grape juice.  Infections or inflammatory conditions such as the flu, a cold, or an inflammation of the nasal membranes (rhinitis).  Gastroesophageal reflux disease (GERD).  Exercise or strenuous activity. SYMPTOMS Symptoms may occur immediately after asthma is  triggered or many hours later. Symptoms include:  Wheezing.  Excessive nighttime or early morning coughing.  Frequent or severe coughing with a common cold.  Chest tightness.  Shortness of breath. DIAGNOSIS  The diagnosis of asthma is made by a review of your medical history and a physical exam. Tests may also be performed. These may include:  Lung function studies. These tests show how much air you breathe in and out.  Allergy tests.  Imaging tests such as X-rays. TREATMENT  Asthma cannot be cured, but it can usually be controlled. Treatment involves identifying and avoiding your asthma triggers. It also involves medicines. There are 2 classes of medicine used for asthma treatment:   Controller medicines. These prevent asthma symptoms from occurring. They are usually taken every day.  Reliever or rescue medicines. These quickly relieve asthma symptoms. They are used as needed and provide short-term relief. Your health care provider will help you create an asthma action plan. An asthma action plan is a written plan for managing and treating your asthma attacks. It includes a list of your asthma triggers and how they may be avoided. It also includes information on when medicines should be taken and when their dosage should be changed. An action plan may also involve the use of a device called a peak flow meter. A peak flow meter measures how well the lungs are working. It helps you monitor your condition. HOME CARE INSTRUCTIONS   Take medicines only as directed by your health care provider. Speak with your health care provider if you have questions about how or when to take the medicines.  Use a peak flow meter as directed by your health care provider. Record and keep track  of readings.  Understand and use the action plan to help minimize or stop an asthma attack without needing to seek medical care.  Control your home environment in the following ways to help prevent asthma  attacks:  Do not smoke. Avoid being exposed to secondhand smoke.  Change your heating and air conditioning filter regularly.  Limit your use of fireplaces and wood stoves.  Get rid of pests (such as roaches and mice) and their droppings.  Throw away plants if you see mold on them.  Clean your floors and dust regularly. Use unscented cleaning products.  Try to have someone else vacuum for you regularly. Stay out of rooms while they are being vacuumed and for a short while afterward. If you vacuum, use a dust mask from a hardware store, a double-layered or microfilter vacuum cleaner bag, or a vacuum cleaner with a HEPA filter.  Replace carpet with wood, tile, or vinyl flooring. Carpet can trap dander and dust.  Use allergy-proof pillows, mattress covers, and box spring covers.  Wash bed sheets and blankets every week in hot water and dry them in a dryer.  Use blankets that are made of polyester or cotton.  Clean bathrooms and kitchens with bleach. If possible, have someone repaint the walls in these rooms with mold-resistant paint. Keep out of the rooms that are being cleaned and painted.  Wash hands frequently. SEEK MEDICAL CARE IF:   You have wheezing, shortness of breath, or a cough even if taking medicine to prevent attacks.  The colored mucus you cough up (sputum) is thicker than usual.  Your sputum changes from clear or white to yellow, green, gray, or bloody.  You have any problems that may be related to the medicines you are taking (such as a rash, itching, swelling, or trouble breathing).  You are using a reliever medicine more than 2-3 times per week.  Your peak flow is still at 50-79% of your personal best after following your action plan for 1 hour.  You have a fever. SEEK IMMEDIATE MEDICAL CARE IF:   You seem to be getting worse and are unresponsive to treatment during an asthma attack.  You are short of breath even at rest.  You get short of breath when  doing very little physical activity.  You have difficulty eating, drinking, or talking due to asthma symptoms.  You develop chest pain.  You develop a fast heartbeat.  You have a bluish color to your lips or fingernails.  You are light-headed, dizzy, or faint.  Your peak flow is less than 50% of your personal best.   This information is not intended to replace advice given to you by your health care provider. Make sure you discuss any questions you have with your health care provider.   Document Released: 07/12/2005 Document Revised: 04/02/2015 Document Reviewed: 02/08/2013 Elsevier Interactive Patient Education 2016 [employer]  Bacterial Conjunctivitis Bacterial conjunctivitis, commonly called pink eye, is an inflammation of the clear membrane that covers the white part of the eye (conjunctiva). The inflammation can also happen on the underside of the eyelids. The blood vessels in the conjunctiva become inflamed, causing the eye to become red or pink. Bacterial conjunctivitis may spread easily from one eye to another and from person to person (contagious).  CAUSES  Bacterial conjunctivitis is caused by bacteria. The bacteria may come from your own skin, your upper respiratory tract, or from someone else with bacterial conjunctivitis. SYMPTOMS  The normally white color of the eye or  the underside of the eyelid is usually pink or red. The pink eye is usually associated with irritation, tearing, and some sensitivity to light. Bacterial conjunctivitis is often associated with a thick, yellowish discharge from the eye. The discharge may turn into a crust on the eyelids overnight, which causes your eyelids to stick together. If a discharge is present, there may also be some blurred vision in the affected eye. DIAGNOSIS  Bacterial conjunctivitis is diagnosed by your caregiver through an eye exam and the symptoms that you report. Your caregiver looks for changes in the surface tissues of your  eyes, which may point to the specific type of conjunctivitis. A sample of any discharge may be collected on a cotton-tip swab if you have a severe case of conjunctivitis, if your cornea is affected, or if you keep getting repeat infections that do not respond to treatment. The sample will be sent to a lab to see if the inflammation is caused by a bacterial infection and to see if the infection will respond to antibiotic medicines. TREATMENT   Bacterial conjunctivitis is treated with antibiotics. Antibiotic eyedrops are most often used. However, antibiotic ointments are also available. Antibiotics pills are sometimes used. Artificial tears or eye washes may ease discomfort. HOME CARE INSTRUCTIONS   To ease discomfort, apply a cool, clean washcloth to your eye for 10-20 minutes, 3-4 times a day.  Gently wipe away any drainage from your eye with a warm, wet washcloth or a cotton ball.  Wash your hands often with soap and water. Use paper towels to dry your hands.  Do not share towels or washcloths. This may spread the infection.  Change or wash your pillowcase every day.  You should not use eye makeup until the infection is gone.  Do not operate machinery or drive if your vision is blurred.  Stop using contact lenses. Ask your caregiver how to sterilize or replace your contacts before using them again. This depends on the type of contact lenses that you use.  When applying medicine to the infected eye, do not touch the edge of your eyelid with the eyedrop bottle or ointment tube. SEEK IMMEDIATE MEDICAL CARE IF:   Your infection has not improved within 3 days after beginning treatment.  You had yellow discharge from your eye and it returns.  You have increased eye pain.  Your eye redness is spreading.  Your vision becomes blurred.  You have a fever or persistent symptoms for more than 2-3 days.  You have a fever and your symptoms suddenly get worse.  You have facial pain, redness,  or swelling. MAKE SURE YOU:   Understand these instructions.  Will watch your condition.  Will get help right away if you are not doing well or get worse.   This information is not intended to replace advice given to you by your health care provider. Make sure you discuss any questions you have with your health care provider.   Document Released: 07/12/2005 Document Revised: 08/02/2014 Document Reviewed: 12/13/2011 Elsevier Interactive Patient Education 2016 [employer] Inc[employer]  Cough, Adult Coughing is a reflex that clears your throat and your airways. Coughing helps to heal and protect your lungs. It is normal to cough occasionally, but a cough that happens with other symptoms or lasts a long time may be a sign of a condition that needs treatment. A cough may last only 2-3 weeks (acute), or it may last longer than 8 weeks (chronic). CAUSES Coughing is commonly caused by:  Breathing  in substances that irritate your lungs.  A viral or bacterial respiratory infection.  Allergies.  Asthma.  Postnasal drip.  Smoking.  Acid backing up from the stomach into the esophagus (gastroesophageal reflux).  Certain medicines.  Chronic lung problems, including COPD (or rarely, lung cancer).  Other medical conditions such as heart failure. HOME CARE INSTRUCTIONS  Pay attention to any changes in your symptoms. Take these actions to help with your discomfort:  Take medicines only as told by your health care provider.  If you were prescribed an antibiotic medicine, take it as told by your health care provider. Do not stop taking the antibiotic even if you start to feel better.  Talk with your health care provider before you take a cough suppressant medicine.  Drink enough fluid to keep your urine clear or pale yellow.  If the air is dry, use a cold steam vaporizer or humidifier in your bedroom or your home to help loosen secretions.  Avoid anything that causes you to cough at work or at  home.  If your cough is worse at night, try sleeping in a semi-upright position.  Avoid cigarette smoke. If you smoke, quit smoking. If you need help quitting, ask your health care provider.  Avoid caffeine.  Avoid alcohol.  Rest as needed. SEEK MEDICAL CARE IF:   You have new symptoms.  You cough up pus.  Your cough does not get better after 2-3 weeks, or your cough gets worse.  You cannot control your cough with suppressant medicines and you are losing sleep.  You develop pain that is getting worse or pain that is not controlled with pain medicines.  You have a fever.  You have unexplained weight loss.  You have night sweats. SEEK IMMEDIATE MEDICAL CARE IF:  You cough up blood.  You have difficulty breathing.  Your heartbeat is very fast.   This information is not intended to replace advice given to you by your health care provider. Make sure you discuss any questions you have with your health care provider.   Document Released: 01/08/2011 Document Revised: 04/02/2015 Document Reviewed: 09/18/2014 Elsevier Interactive Patient Education 2016 [employer].  How to Avoid Diabetes Problems You can do a lot to prevent or slow down diabetes problems. Following your diabetes plan and taking care of yourself can reduce your risk of serious or life-threatening complications. Below, you will find certain things you can do to prevent diabetes problems. MANAGE YOUR DIABETES Follow your health care provider's, nurse educator's, and dietitian's instructions for managing your diabetes. They will teach you the basics of diabetes care. They can help answer questions you may have. Learn about diabetes and make healthy choices regarding eating and physical activity. Monitor your blood glucose level regularly. Your health care provider will help you decide how often to check your blood glucose level depending on your treatment goals and how well you are meeting them.  DO NOT USE  NICOTINE Nicotine and diabetes are a dangerous combination. Nicotine raises your risk for diabetes problems. If you quit using nicotine, you will lower your risk for heart attack, stroke, nerve disease, and kidney disease. Your cholesterol and your blood pressure levels may improve. Your blood circulation will also improve. Do not use any tobacco products, including cigarettes, chewing tobacco, or electronic cigarettes. If you need help quitting, ask your health care provider. KEEP YOUR BLOOD PRESSURE UNDER CONTROL Your health care provider will determine your individualized target blood pressure based on your age, your medicines, how long  you have had diabetes, and any other medical conditions you have. Blood pressure consists of two numbers. Generally, the goal is to keep your top number (systolic pressure) at or below 130, and your bottom number (diastolic pressure) at or below 80. Your health care provider may recommend a lower target blood pressure reading, if appropriate. Meal planning, medicines, and exercise can help you reach your target blood pressure. Make sure your health care provider checks your blood pressure at every visit. KEEP YOUR CHOLESTEROL UNDER CONTROL Normal cholesterol levels will help prevent heart disease and stroke. These are the biggest health problems for people with diabetes. Keeping cholesterol levels under control can also help with blood flow. Have your cholesterol level checked at least once a year. Your health care provider may prescribe a medicine known as a statin. Statins lower your cholesterol. If you are not taking a statin, ask your health care provider if you should be. Meal planning, exercise, and medicines can help you reach your cholesterol targets.  SCHEDULE AND KEEP YOUR ANNUAL PHYSICAL EXAMS AND EYE EXAMS Your health care provider will tell you how often he or she wants to see you depending on your plan of treatment. It is important that you keep these  appointments so that possible problems can be identified early and complications can be avoided or treated.  Every visit with your health care provider should include your weight, blood pressure, and an evaluation of your blood glucose control.  Your hemoglobin A1c should be checked:  At least twice a year if you are at your goal.  Every 3 months if there are changes in treatment.  If you are not meeting your goals.  Your blood lipids should be checked yearly. You should also be checked yearly to see if you have protein in your urine (microalbumin).  Schedule a dilated eye exam within 5 years of your diagnosis if you have type 1 diabetes, and then yearly. Schedule a dilated eye exam at diagnosis if you have type 2 diabetes, and then yearly. All exams thereafter can be extended to every 2 to 3 years if one or more exams have been normal. KEEP YOUR VACCINES CURRENT It is recommended that you receive a flu (influenza) vaccine every year. It is also recommended that you receive a pneumonia (pneumococcal) vaccine. If you are 1 years of age or older and have never received a pneumonia vaccine, this vaccine may be given as a series of two separate shots. Ask your health care provider which additional vaccines may be recommended. TAKE CARE OF YOUR FEET  Diabetes may cause you to have a poor blood supply (circulation) to your legs and feet. Because of this, the skin may be thinner, break easier, and heal more slowly. You also may have nerve damage in your legs and feet, causing decreased feeling. You may not notice minor injuries to your feet that could lead to serious problems or infections. Taking care of your feet is very important. Visual foot exams are performed at every routine medical visit. The exams check for cuts, injuries, or other problems with the feet. A comprehensive foot exam should be done yearly. This includes visual inspection as well as assessing foot pulses and testing for loss of  sensation. You should also do the following:  Inspect your feet daily for cuts, calluses, blisters, ingrown toenails, and signs of infection, such as redness, swelling, or pus.  Wash and dry your feet thoroughly, especially between the toes.  Avoid soaking your feet  regularly in hot water baths.  Moisturize dry skin with lotion, avoiding areas between your toes.  Cut toenails straight across and file the edges.  Avoid shoes that do not fit well or have areas that irritate your skin.  Avoid going barefooted or wearing only socks. Your feet need protection. TAKE CARE OF YOUR TEETH People with poorly controlled diabetes are more likely to have gum (periodontal) disease. These infections make diabetes harder to control. Periodontal diseases, if left untreated, can lead to tooth loss. Brush your teeth twice a day, floss, and see your dentist for checkups and cleaning every 6 months, or 2 times a year. ASK YOUR HEALTH CARE PROVIDER ABOUT TAKING ASPIRIN Taking aspirin daily is recommended to help prevent cardiovascular disease in people with and without diabetes. Ask your health care provider if this would benefit you and what dose he or she would recommend. DRINK RESPONSIBLY Moderate amounts of alcohol (less than 1 drink per day for adult women and less than 2 drinks per day for adult men) have a minimal effect on blood glucose if ingested with food. It is important to eat food with alcohol to avoid hypoglycemia. People should avoid alcohol if they have a history of alcohol abuse or dependence, if they are pregnant, and if they have liver disease, pancreatitis, advanced neuropathy, or severe hypertriglyceridemia. LESSEN STRESS Living with diabetes can be stressful. When you are under stress, your blood glucose may be affected in two ways:  Stress hormones may cause your blood glucose to rise.  You may be distracted from taking good care of yourself. It is a good idea to be aware of your stress  level and make changes that are necessary to help you better manage challenging situations. Support groups, planned relaxation, a hobby you enjoy, meditation, healthy relationships, and exercise all work to lower your stress level. If your efforts do not seem to be helping, get help from your health care provider or a trained mental health professional.   This information is not intended to replace advice given to you by your health care provider. Make sure you discuss any questions you have with your health care provider.   Document Released: 03/30/2011 Document Revised: 08/02/2014 Document Reviewed: 09/05/2013 Elsevier Interactive Patient Education 2016 [employer].  How to Use Eye Drops and Eye Ointments HOW TO APPLY EYE DROPS Follow these steps when applying eye drops:  Wash your hands.  Tilt your head back.  Put a finger under your eye and use it to gently pull your lower lid downward. Keep that finger in place.  Using your other hand, hold the dropper between your thumb and index finger.  Position the dropper just over the edge of the lower lid. Hold it as close to your eye as you can without touching the dropper to your eye.  Steady your hand. One way to do this is to lean your index finger against your brow.  Look up.  Slowly and gently squeeze one drop of medicine into your eye.  Close your eye.  Place a finger between your lower eyelid and your nose. Press gently for 2 minutes. This increases the amount of time that the medicine is exposed to the eye. It also reduces side effects that can develop if the drop gets into the bloodstream through the nose. HOW TO APPLY EYE OINTMENTS Follow these steps when applying eye ointments:  Wash your hands.  Put a finger under your eye and use it to gently pull your lower  lid downward. Keep that finger in place.  Using your other hand, place the tip of the tube between your thumb and index finger with the remaining fingers braced  against your cheek or nose.  Hold the tube just over the edge of your lower lid without touching the tube to your lid or eyeball.  Look up.  Line the inner part of your lower lid with ointment.  Gently pull up on your upper lid and look down. This will force the ointment to spread over the surface of the eye.  Release the upper lid.  If you can, close your eyes for 1-2 minutes. Do not rub your eyes. If you applied the ointment correctly, your vision will be blurry for a few minutes. This is normal. ADDITIONAL INFORMATION  Make sure to use the eye drops or ointment as told by your health care provider.  If you have been told to use both eye drops and an eye ointment, apply the eye drops first, then wait 3-4 minutes before you apply the ointment.  Try not to touch the tip of the dropper or tube to your eye. A dropper or tube that has touched the eye can become contaminated.   This information is not intended to replace advice given to you by your health care provider. Make sure you discuss any questions you have with your health care provider.   Document Released: 10/18/2000 Document Revised: 11/26/2014 Document Reviewed: 07/08/2014 Elsevier Interactive Patient Education 2016 [employer].  How to Use an Inhaler Proper inhaler technique is very important. Good technique ensures that the medicine reaches the lungs. Poor technique results in depositing the medicine on the tongue and back of the throat rather than in the airways. If you do not use the inhaler with good technique, the medicine will not help you. STEPS TO FOLLOW IF USING AN INHALER WITHOUT AN EXTENSION TUBE  Remove the cap from the inhaler.  If you are using the inhaler for the first time, you will need to prime it. Shake the inhaler for 5 seconds and release four puffs into the air, away from your face. Ask your health care provider or pharmacist if you have questions about priming your inhaler.  Shake the inhaler for 5  seconds before each breath in (inhalation).  Position the inhaler so that the top of the canister faces up.  Put your index finger on the top of the medicine canister. Your thumb supports the bottom of the inhaler.  Open your mouth.  Either place the inhaler between your teeth and place your lips tightly around the mouthpiece, or hold the inhaler 1-2 inches away from your open mouth. If you are unsure of which technique to use, ask your health care provider.  Breathe out (exhale) normally and as completely as possible.  Press the canister down with your index finger to release the medicine.  At the same time as the canister is pressed, inhale deeply and slowly until your lungs are completely filled. This should take 4-6 seconds. Keep your tongue down.  Hold the medicine in your lungs for 5-10 seconds (10 seconds is best). This helps the medicine get into the small airways of your lungs.  Breathe out slowly, through pursed lips. Whistling is an example of pursed lips.  Wait at least 15-30 seconds between puffs. Continue with the above steps until you have taken the number of puffs your health care provider has ordered. Do not use the inhaler more than your health care  provider tells you.  Replace the cap on the inhaler.  Follow the directions from your health care provider or the inhaler insert for cleaning the inhaler. STEPS TO FOLLOW IF USING AN INHALER WITH AN EXTENSION (SPACER)  Remove the cap from the inhaler.  If you are using the inhaler for the first time, you will need to prime it. Shake the inhaler for 5 seconds and release four puffs into the air, away from your face. Ask your health care provider or pharmacist if you have questions about priming your inhaler.  Shake the inhaler for 5 seconds before each breath in (inhalation).  Place the open end of the spacer onto the mouthpiece of the inhaler.  Position the inhaler so that the top of the canister faces up and the  spacer mouthpiece faces you.  Put your index finger on the top of the medicine canister. Your thumb supports the bottom of the inhaler and the spacer.  Breathe out (exhale) normally and as completely as possible.  Immediately after exhaling, place the spacer between your teeth and into your mouth. Close your lips tightly around the spacer.  Press the canister down with your index finger to release the medicine.  At the same time as the canister is pressed, inhale deeply and slowly until your lungs are completely filled. This should take 4-6 seconds. Keep your tongue down and out of the way.  Hold the medicine in your lungs for 5-10 seconds (10 seconds is best). This helps the medicine get into the small airways of your lungs. Exhale.  Repeat inhaling deeply through the spacer mouthpiece. Again hold that breath for up to 10 seconds (10 seconds is best). Exhale slowly. If it is difficult to take this second deep breath through the spacer, breathe normally several times through the spacer. Remove the spacer from your mouth.  Wait at least 15-30 seconds between puffs. Continue with the above steps until you have taken the number of puffs your health care provider has ordered. Do not use the inhaler more than your health care provider tells you.  Remove the spacer from the inhaler, and place the cap on the inhaler.  Follow the directions from your health care provider or the inhaler insert for cleaning the inhaler and spacer. If you are using different kinds of inhalers, use your quick relief medicine to open the airways 10-15 minutes before using a steroid if instructed to do so by your health care provider. If you are unsure which inhalers to use and the order of using them, ask your health care provider, nurse, or respiratory therapist. If you are using a steroid inhaler, always rinse your mouth with water after your last puff, then gargle and spit out the water. Do not swallow the  water. AVOID:  Inhaling before or after starting the spray of medicine. It takes practice to coordinate your breathing with triggering the spray.  Inhaling through the nose (rather than the mouth) when triggering the spray. HOW TO DETERMINE IF YOUR INHALER IS FULL OR NEARLY EMPTY You cannot know when an inhaler is empty by shaking it. A few inhalers are now being made with dose counters. Ask your health care provider for a prescription that has a dose counter if you feel you need that extra help. If your inhaler does not have a counter, ask your health care provider to help you determine the date you need to refill your inhaler. Write the refill date on a calendar or your inhaler canister.  Refill your inhaler 7-10 days before it runs out. Be sure to keep an adequate supply of medicine. This includes making sure it is not expired, and that you have a spare inhaler.  SEEK MEDICAL CARE IF:   Your symptoms are only partially relieved with your inhaler.  You are having trouble using your inhaler.  You have some increase in phlegm. SEEK IMMEDIATE MEDICAL CARE IF:   You feel little or no relief with your inhalers. You are still wheezing and are feeling shortness of breath or tightness in your chest or both.  You have dizziness, headaches, or a fast heart rate.  You have chills, fever, or night sweats.  You have a noticeable increase in phlegm production, or there is blood in the phlegm. MAKE SURE YOU:   Understand these instructions.  Will watch your condition.  Will get help right away if you are not doing well or get worse.   This information is not intended to replace advice given to you by your health care provider. Make sure you discuss any questions you have with your health care provider.   Document Released: 07/09/2000 Document Revised: 05/02/2013 Document Reviewed: 02/08/2013 Elsevier Interactive Patient Education 2016 [employer]  Nonspecific Chest Pain It is often hard to  find the cause of chest pain. There is always a chance that your pain could be related to something serious, such as a heart attack or a blood clot in your lungs. Chest pain can also be caused by conditions that are not life-threatening. If you have chest pain, it is very important to follow up with your doctor.  HOME CARE  If you were prescribed an antibiotic medicine, finish it all even if you start to feel better.  Avoid any activities that cause chest pain.  Do not use any tobacco products, including cigarettes, chewing tobacco, or electronic cigarettes. If you need help quitting, ask your doctor.  Do not drink alcohol.  Take medicines only as told by your doctor.  Keep all follow-up visits as told by your doctor. This is important. This includes any further testing if your chest pain does not go away.  Your doctor may tell you to keep your head raised (elevated) while you sleep.  Make lifestyle changes as told by your doctor. These may include:  Getting regular exercise. Ask your doctor to suggest some activities that are safe for you.  Eating a heart-healthy diet. Your doctor or a diet specialist (dietitian) can help you to learn healthy eating options.  Maintaining a healthy weight.  Managing diabetes, if necessary.  Reducing stress. GET HELP IF:  Your chest pain does not go away, even after treatment.  You have a rash with blisters on your chest.  You have a fever. GET HELP RIGHT AWAY IF:  Your chest pain is worse.  You have an increasing cough, or you cough up blood.  You have severe belly (abdominal) pain.  You feel extremely weak.  You pass out (faint).  You have chills.  You have sudden, unexplained chest discomfort.  You have sudden, unexplained discomfort in your arms, back, neck, or jaw.  You have shortness of breath at any time.  You suddenly start to sweat, or your skin gets clammy.  You feel nauseous.  You vomit.  You suddenly feel  light-headed or dizzy.  Your heart begins to beat quickly, or it feels like it is skipping beats. These symptoms may be an emergency. Do not wait to see if the symptoms  will go away. Get medical help right away. Call your local emergency services (911 in the [place] Do not drive yourself to the hospital.   This information is not intended to replace advice given to you by your health care provider. Make sure you discuss any questions you have with your health care provider.   Document Released: 12/29/2007 Document Revised: 08/02/2014 Document Reviewed: 02/15/2014 Elsevier Interactive Patient Education 2016 [employer].  Smoking Cessation, Tips for Success If you are ready to quit smoking, congratulations! You have chosen to help yourself be healthier. Cigarettes bring nicotine, tar, carbon monoxide, and other irritants into your body. Your lungs, heart, and blood vessels will be able to work better without these poisons. There are many different ways to quit smoking. Nicotine gum, nicotine patches, a nicotine inhaler, or nicotine nasal spray can help with physical craving. Hypnosis, support groups, and medicines help break the habit of smoking. WHAT THINGS CAN I DO TO MAKE QUITTING EASIER?  Here are some tips to help you quit for good:  Pick a date when you will quit smoking completely. Tell all of your friends and family about your plan to quit on that date.  Do not try to slowly cut down on the number of cigarettes you are smoking. Pick a quit date and quit smoking completely starting on that day.  Throw away all cigarettes.   Clean and remove all ashtrays from your home, work, and car.  On a card, write down your reasons for quitting. Carry the card with you and read it when you get the urge to smoke.  Cleanse your body of nicotine. Drink enough water and fluids to keep your urine clear or pale yellow. Do this after quitting to flush the nicotine from your body.  Learn to predict your  moods. Do not let a bad situation be your excuse to have a cigarette. Some situations in your life might tempt you into wanting a cigarette.  Never have "just one" cigarette. It leads to wanting another and another. Remind yourself of your decision to quit.  Change habits associated with smoking. If you smoked while driving or when feeling stressed, try other activities to replace smoking. Stand up when drinking your coffee. Brush your teeth after eating. Sit in a different chair when you read the paper. Avoid alcohol while trying to quit, and try to drink fewer caffeinated beverages. Alcohol and caffeine may urge you to smoke.  Avoid foods and drinks that can trigger a desire to smoke, such as sugary or spicy foods and alcohol.  Ask people who smoke not to smoke around you.  Have something planned to do right after eating or having a cup of coffee. For example, plan to take a walk or exercise.  Try a relaxation exercise to calm you down and decrease your stress. Remember, you may be tense and nervous for the first 2 weeks after you quit, but this will pass.  Find new activities to keep your hands busy. Play with a pen, coin, or rubber band. Doodle or draw things on paper.  Brush your teeth right after eating. This will help cut down on the craving for the taste of tobacco after meals. You can also try mouthwash.   Use oral substitutes in place of cigarettes. Try using lemon drops, carrots, cinnamon sticks, or chewing gum. Keep them handy so they are available when you have the urge to smoke.  When you have the urge to smoke, try deep breathing.  Designate  your home as a nonsmoking area.  If you are a heavy smoker, ask your health care provider about a prescription for nicotine chewing gum. It can ease your withdrawal from nicotine.  Reward yourself. Set aside the cigarette money you save and buy yourself something nice.  Look for support from others. Join a support group or smoking  cessation program. Ask someone at home or at work to help you with your plan to quit smoking.  Always ask yourself, "Do I need this cigarette or is this just a reflex?" Tell yourself, "Today, I choose not to smoke," or "I do not want to smoke." You are reminding yourself of your decision to quit.  Do not replace cigarette smoking with electronic cigarettes (commonly called e-cigarettes). The safety of e-cigarettes is unknown, and some may contain harmful chemicals.  If you relapse, do not give up! Plan ahead and think about what you will do the next time you get the urge to smoke. HOW WILL I FEEL WHEN I QUIT SMOKING? You may have symptoms of withdrawal because your body is used to nicotine (the addictive substance in cigarettes). You may crave cigarettes, be irritable, feel very hungry, cough often, get headaches, or have difficulty concentrating. The withdrawal symptoms are only temporary. They are strongest when you first quit but will go away within 10-14 days. When withdrawal symptoms occur, stay in control. Think about your reasons for quitting. Remind yourself that these are signs that your body is healing and getting used to being without cigarettes. Remember that withdrawal symptoms are easier to treat than the major diseases that smoking can cause.  Even after the withdrawal is over, expect periodic urges to smoke. However, these cravings are generally short lived and will go away whether you smoke or not. Do not smoke! WHAT RESOURCES ARE AVAILABLE TO HELP ME QUIT SMOKING? Your health care provider can direct you to community resources or hospitals for support, which may include:  Group support.  Education.  Hypnosis.  Therapy.   This information is not intended to replace advice given to you by your health care provider. Make sure you discuss any questions you have with your health care provider.   Document Released: 04/09/2004 Document Revised: 08/02/2014 Document Reviewed:  12/28/2012 Elsevier Interactive Patient Education 2016 [employer] Inc[employer]  Viral Infections A viral infection can be caused by different types of viruses.Most viral infections are not serious and resolve on their own. However, some infections may cause severe symptoms and may lead to further complications. SYMPTOMS Viruses can frequently cause:  Minor sore throat.  Aches and pains.  Headaches.  Runny nose.  Different types of rashes.  Watery eyes.  Tiredness.  Cough.  Loss of appetite.  Gastrointestinal infections, resulting in nausea, vomiting, and diarrhea. These symptoms do not respond to antibiotics because the infection is not caused by bacteria. However, you might catch a bacterial infection following the viral infection. This is sometimes called a "superinfection." Symptoms of such a bacterial infection may include:  Worsening sore throat with pus and difficulty swallowing.  Swollen neck glands.  Chills and a high or persistent fever.  Severe headache.  Tenderness over the sinuses.  Persistent overall ill feeling (malaise), muscle aches, and tiredness (fatigue).  Persistent cough.  Yellow, green, or brown mucus production with coughing. HOME CARE INSTRUCTIONS   Only take over-the-counter or prescription medicines for pain, discomfort, diarrhea, or fever as directed by your caregiver.  Drink enough water and fluids to keep your urine clear or pale  yellow. Sports drinks can provide valuable electrolytes, sugars, and hydration.  Get plenty of rest and maintain proper nutrition. Soups and broths with crackers or rice are fine. SEEK IMMEDIATE MEDICAL CARE IF:   You have severe headaches, shortness of breath, chest pain, neck pain, or an unusual rash.  You have uncontrolled vomiting, diarrhea, or you are unable to keep down fluids.  You or your child has an oral temperature above 102 F (38.9 C), not controlled by medicine.  Your baby is older than 3 months  with a rectal temperature of 102 F (38.9 C) or higher.  Your baby is 68 months old or younger with a rectal temperature of 100.4 F (38 C) or higher. MAKE SURE YOU:   Understand these instructions.  Will watch your condition.  Will get help right away if you are not doing well or get worse.   This information is not intended to replace advice given to you by your health care provider. Make sure you discuss any questions you have with your health care provider.   Document Released: 04/21/2005 Document Revised: 10/04/2011 Document Reviewed: 12/18/2014 Elsevier Interactive Patient Education 2016 [employer].   Prescription for antibiotic and eyedrops. Increase fluids. Tylenol for fever. Check your sugars regularly. Follow-up with wound care center tomorrow

## 2015-08-19 NOTE — ED Provider Notes (Signed)
I was asked to evaluate the patient for fever. He was noted have an inflamed left conjunctiva. Additionally his right foot was examined which shows an ulcer on the plantar aspect mid foot at the MTP joint.  He does not appear toxic. His glucose has been less than 200. Will Rx with tobramycin eyedrops and Septra DS twice a day. He has an appointment at the wound care center tomorrow.  Donnetta Hutching, MD 08/19/15 [phone]

## 2015-08-20 LAB — URINE CULTURE: CULTURE: NO GROWTH

## 2015-09-01 ENCOUNTER — Other Ambulatory Visit: Payer: Self-pay | Admitting: Internal Medicine

## 2015-09-01 ENCOUNTER — Encounter (HOSPITAL_BASED_OUTPATIENT_CLINIC_OR_DEPARTMENT_OTHER): Payer: BLUE CROSS/BLUE SHIELD | Attending: Internal Medicine

## 2015-09-01 DIAGNOSIS — Z794 Long term (current) use of insulin: Principal | ICD-10-CM

## 2015-09-01 DIAGNOSIS — I10 Essential (primary) hypertension: Secondary | ICD-10-CM | POA: Diagnosis not present

## 2015-09-01 DIAGNOSIS — L97512 Non-pressure chronic ulcer of other part of right foot with fat layer exposed: Secondary | ICD-10-CM | POA: Diagnosis not present

## 2015-09-01 DIAGNOSIS — E1151 Type 2 diabetes mellitus with diabetic peripheral angiopathy without gangrene: Secondary | ICD-10-CM | POA: Diagnosis not present

## 2015-09-01 DIAGNOSIS — G4730 Sleep apnea, unspecified: Secondary | ICD-10-CM | POA: Insufficient documentation

## 2015-09-01 DIAGNOSIS — M1990 Unspecified osteoarthritis, unspecified site: Secondary | ICD-10-CM | POA: Diagnosis not present

## 2015-09-01 DIAGNOSIS — E11621 Type 2 diabetes mellitus with foot ulcer: Secondary | ICD-10-CM

## 2015-09-01 DIAGNOSIS — L97509 Non-pressure chronic ulcer of other part of unspecified foot with unspecified severity: Principal | ICD-10-CM

## 2015-09-01 DIAGNOSIS — E1140 Type 2 diabetes mellitus with diabetic neuropathy, unspecified: Secondary | ICD-10-CM | POA: Insufficient documentation

## 2015-09-01 DIAGNOSIS — L97521 Non-pressure chronic ulcer of other part of left foot limited to breakdown of skin: Secondary | ICD-10-CM | POA: Insufficient documentation

## 2015-09-01 DIAGNOSIS — J45909 Unspecified asthma, uncomplicated: Secondary | ICD-10-CM | POA: Diagnosis not present

## 2015-09-01 DIAGNOSIS — F17218 Nicotine dependence, cigarettes, with other nicotine-induced disorders: Secondary | ICD-10-CM | POA: Diagnosis not present

## 2015-09-01 DIAGNOSIS — L84 Corns and callosities: Secondary | ICD-10-CM | POA: Diagnosis not present

## 2015-09-01 LAB — GLUCOSE, CAPILLARY: Glucose-Capillary: 516 mg/dL — ABNORMAL HIGH (ref 65–99)

## 2015-09-10 DIAGNOSIS — E11621 Type 2 diabetes mellitus with foot ulcer: Secondary | ICD-10-CM | POA: Diagnosis not present

## 2015-09-15 ENCOUNTER — Ambulatory Visit
Admission: RE | Admit: 2015-09-15 | Discharge: 2015-09-15 | Disposition: A | Discharge status: Home / Self Care | Payer: BLUE CROSS/BLUE SHIELD | Source: Ambulatory Visit | Attending: Internal Medicine | Admitting: Internal Medicine

## 2015-09-15 DIAGNOSIS — E11621 Type 2 diabetes mellitus with foot ulcer: Secondary | ICD-10-CM

## 2015-09-15 DIAGNOSIS — Z794 Long term (current) use of insulin: Principal | ICD-10-CM

## 2015-09-15 DIAGNOSIS — L97509 Non-pressure chronic ulcer of other part of unspecified foot with unspecified severity: Principal | ICD-10-CM

## 2015-09-15 MED ORDER — GADOBENATE DIMEGLUMINE 529 MG/ML IV SOLN
14.0000 mL | Freq: Once | INTRAVENOUS | Status: AC | PRN
  Administered 2015-09-15: 14 mL via INTRAVENOUS

## 2015-09-17 DIAGNOSIS — E11621 Type 2 diabetes mellitus with foot ulcer: Secondary | ICD-10-CM | POA: Diagnosis not present

## 2015-09-17 LAB — GLUCOSE, CAPILLARY: GLUCOSE-CAPILLARY: 392 mg/dL — AB (ref 65–99)

## 2015-09-24 ENCOUNTER — Encounter (HOSPITAL_BASED_OUTPATIENT_CLINIC_OR_DEPARTMENT_OTHER): Payer: BLUE CROSS/BLUE SHIELD | Attending: Surgery

## 2015-09-24 DIAGNOSIS — L97512 Non-pressure chronic ulcer of other part of right foot with fat layer exposed: Secondary | ICD-10-CM | POA: Insufficient documentation

## 2015-09-24 DIAGNOSIS — F17210 Nicotine dependence, cigarettes, uncomplicated: Secondary | ICD-10-CM | POA: Insufficient documentation

## 2015-09-24 DIAGNOSIS — L84 Corns and callosities: Secondary | ICD-10-CM | POA: Insufficient documentation

## 2015-09-24 DIAGNOSIS — E1151 Type 2 diabetes mellitus with diabetic peripheral angiopathy without gangrene: Secondary | ICD-10-CM | POA: Insufficient documentation

## 2015-09-24 DIAGNOSIS — E11621 Type 2 diabetes mellitus with foot ulcer: Secondary | ICD-10-CM | POA: Insufficient documentation

## 2015-09-24 DIAGNOSIS — J45909 Unspecified asthma, uncomplicated: Secondary | ICD-10-CM | POA: Insufficient documentation

## 2015-09-24 DIAGNOSIS — G4730 Sleep apnea, unspecified: Secondary | ICD-10-CM | POA: Insufficient documentation

## 2015-09-24 DIAGNOSIS — I10 Essential (primary) hypertension: Secondary | ICD-10-CM | POA: Insufficient documentation

## 2015-09-24 DIAGNOSIS — E1140 Type 2 diabetes mellitus with diabetic neuropathy, unspecified: Secondary | ICD-10-CM | POA: Insufficient documentation

## 2015-09-25 DIAGNOSIS — I10 Essential (primary) hypertension: Secondary | ICD-10-CM | POA: Diagnosis not present

## 2015-09-25 DIAGNOSIS — E1140 Type 2 diabetes mellitus with diabetic neuropathy, unspecified: Secondary | ICD-10-CM | POA: Diagnosis not present

## 2015-09-25 DIAGNOSIS — L97512 Non-pressure chronic ulcer of other part of right foot with fat layer exposed: Secondary | ICD-10-CM | POA: Diagnosis not present

## 2015-09-25 DIAGNOSIS — F17210 Nicotine dependence, cigarettes, uncomplicated: Secondary | ICD-10-CM | POA: Diagnosis not present

## 2015-09-25 DIAGNOSIS — L84 Corns and callosities: Secondary | ICD-10-CM | POA: Diagnosis not present

## 2015-09-25 DIAGNOSIS — J45909 Unspecified asthma, uncomplicated: Secondary | ICD-10-CM | POA: Diagnosis not present

## 2015-09-25 DIAGNOSIS — E1151 Type 2 diabetes mellitus with diabetic peripheral angiopathy without gangrene: Secondary | ICD-10-CM | POA: Diagnosis not present

## 2015-09-25 DIAGNOSIS — G4730 Sleep apnea, unspecified: Secondary | ICD-10-CM | POA: Diagnosis not present

## 2015-09-25 DIAGNOSIS — E11621 Type 2 diabetes mellitus with foot ulcer: Secondary | ICD-10-CM | POA: Diagnosis present

## 2015-10-02 DIAGNOSIS — E11621 Type 2 diabetes mellitus with foot ulcer: Secondary | ICD-10-CM | POA: Diagnosis not present

## 2015-10-09 DIAGNOSIS — E11621 Type 2 diabetes mellitus with foot ulcer: Secondary | ICD-10-CM | POA: Diagnosis not present

## 2015-10-16 DIAGNOSIS — E11621 Type 2 diabetes mellitus with foot ulcer: Secondary | ICD-10-CM | POA: Diagnosis not present

## 2015-10-16 LAB — GLUCOSE, CAPILLARY: Glucose-Capillary: 359 mg/dL — ABNORMAL HIGH (ref 65–99)

## 2015-10-27 ENCOUNTER — Encounter (HOSPITAL_BASED_OUTPATIENT_CLINIC_OR_DEPARTMENT_OTHER): Payer: BLUE CROSS/BLUE SHIELD | Attending: Internal Medicine

## 2015-10-27 DIAGNOSIS — I10 Essential (primary) hypertension: Secondary | ICD-10-CM | POA: Diagnosis not present

## 2015-10-27 DIAGNOSIS — F17210 Nicotine dependence, cigarettes, uncomplicated: Secondary | ICD-10-CM | POA: Insufficient documentation

## 2015-10-27 DIAGNOSIS — M1990 Unspecified osteoarthritis, unspecified site: Secondary | ICD-10-CM | POA: Insufficient documentation

## 2015-10-27 DIAGNOSIS — E1140 Type 2 diabetes mellitus with diabetic neuropathy, unspecified: Secondary | ICD-10-CM | POA: Insufficient documentation

## 2015-10-27 DIAGNOSIS — E11621 Type 2 diabetes mellitus with foot ulcer: Secondary | ICD-10-CM | POA: Diagnosis present

## 2015-10-27 DIAGNOSIS — G4730 Sleep apnea, unspecified: Secondary | ICD-10-CM | POA: Diagnosis not present

## 2015-10-27 DIAGNOSIS — L97412 Non-pressure chronic ulcer of right heel and midfoot with fat layer exposed: Secondary | ICD-10-CM | POA: Diagnosis not present

## 2015-10-27 LAB — GLUCOSE, CAPILLARY

## 2015-11-03 DIAGNOSIS — E11621 Type 2 diabetes mellitus with foot ulcer: Secondary | ICD-10-CM | POA: Diagnosis not present

## 2015-11-10 DIAGNOSIS — E11621 Type 2 diabetes mellitus with foot ulcer: Secondary | ICD-10-CM | POA: Diagnosis not present

## 2015-11-17 DIAGNOSIS — E11621 Type 2 diabetes mellitus with foot ulcer: Secondary | ICD-10-CM | POA: Diagnosis not present

## 2015-11-24 ENCOUNTER — Ambulatory Visit (HOSPITAL_BASED_OUTPATIENT_CLINIC_OR_DEPARTMENT_OTHER): Payer: Self-pay

## 2015-11-30 ENCOUNTER — Inpatient Hospital Stay
Admission: EM | Admit: 2015-11-30 | Discharge: 2015-12-02 | DRG: 638 | Disposition: A | Discharge status: Home / Self Care | Payer: BLUE CROSS/BLUE SHIELD | Attending: Internal Medicine | Admitting: Internal Medicine

## 2015-11-30 ENCOUNTER — Inpatient Hospital Stay: Payer: BLUE CROSS/BLUE SHIELD

## 2015-11-30 ENCOUNTER — Emergency Department: Payer: BLUE CROSS/BLUE SHIELD

## 2015-11-30 DIAGNOSIS — Z7951 Long term (current) use of inhaled steroids: Secondary | ICD-10-CM | POA: Diagnosis not present

## 2015-11-30 DIAGNOSIS — S81801A Unspecified open wound, right lower leg, initial encounter: Secondary | ICD-10-CM

## 2015-11-30 DIAGNOSIS — L97919 Non-pressure chronic ulcer of unspecified part of right lower leg with unspecified severity: Secondary | ICD-10-CM | POA: Diagnosis present

## 2015-11-30 DIAGNOSIS — E1142 Type 2 diabetes mellitus with diabetic polyneuropathy: Secondary | ICD-10-CM | POA: Diagnosis present

## 2015-11-30 DIAGNOSIS — E1100 Type 2 diabetes mellitus with hyperosmolarity without nonketotic hyperglycemic-hyperosmolar coma (NKHHC): Secondary | ICD-10-CM | POA: Diagnosis present

## 2015-11-30 DIAGNOSIS — F329 Major depressive disorder, single episode, unspecified: Secondary | ICD-10-CM | POA: Diagnosis present

## 2015-11-30 DIAGNOSIS — Z9119 Patient's noncompliance with other medical treatment and regimen: Secondary | ICD-10-CM

## 2015-11-30 DIAGNOSIS — G4733 Obstructive sleep apnea (adult) (pediatric): Secondary | ICD-10-CM | POA: Diagnosis present

## 2015-11-30 DIAGNOSIS — F319 Bipolar disorder, unspecified: Secondary | ICD-10-CM | POA: Diagnosis present

## 2015-11-30 DIAGNOSIS — Z79899 Other long term (current) drug therapy: Secondary | ICD-10-CM

## 2015-11-30 DIAGNOSIS — R739 Hyperglycemia, unspecified: Secondary | ICD-10-CM | POA: Diagnosis present

## 2015-11-30 DIAGNOSIS — L03115 Cellulitis of right lower limb: Secondary | ICD-10-CM | POA: Diagnosis present

## 2015-11-30 DIAGNOSIS — Z791 Long term (current) use of non-steroidal anti-inflammatories (NSAID): Secondary | ICD-10-CM

## 2015-11-30 DIAGNOSIS — E785 Hyperlipidemia, unspecified: Secondary | ICD-10-CM | POA: Diagnosis present

## 2015-11-30 DIAGNOSIS — I10 Essential (primary) hypertension: Secondary | ICD-10-CM | POA: Diagnosis present

## 2015-11-30 DIAGNOSIS — K219 Gastro-esophageal reflux disease without esophagitis: Secondary | ICD-10-CM | POA: Diagnosis present

## 2015-11-30 DIAGNOSIS — E43 Unspecified severe protein-calorie malnutrition: Secondary | ICD-10-CM | POA: Insufficient documentation

## 2015-11-30 DIAGNOSIS — F1410 Cocaine abuse, uncomplicated: Secondary | ICD-10-CM | POA: Diagnosis present

## 2015-11-30 DIAGNOSIS — Z794 Long term (current) use of insulin: Secondary | ICD-10-CM

## 2015-11-30 DIAGNOSIS — F17210 Nicotine dependence, cigarettes, uncomplicated: Secondary | ICD-10-CM | POA: Diagnosis present

## 2015-11-30 DIAGNOSIS — E11622 Type 2 diabetes mellitus with other skin ulcer: Secondary | ICD-10-CM | POA: Diagnosis present

## 2015-11-30 DIAGNOSIS — E1010 Type 1 diabetes mellitus with ketoacidosis without coma: Secondary | ICD-10-CM

## 2015-11-30 LAB — GLUCOSE, CAPILLARY
GLUCOSE-CAPILLARY: 534 mg/dL — AB (ref 65–99)
GLUCOSE-CAPILLARY: 278 mg/dL — AB (ref 65–99)
GLUCOSE-CAPILLARY: 240 mg/dL — AB (ref 65–99)
GLUCOSE-CAPILLARY: 143 mg/dL — AB (ref 65–99)
Glucose-Capillary: 189 mg/dL — ABNORMAL HIGH (ref 65–99)
Glucose-Capillary: 105 mg/dL — ABNORMAL HIGH (ref 65–99)

## 2015-11-30 LAB — BASIC METABOLIC PANEL
ANION GAP: 12 (ref 5–15)
ANION GAP: 9 (ref 5–15)
BUN: 13 mg/dL (ref 6–20)
BUN: 10 mg/dL (ref 6–20)
CALCIUM: 9.2 mg/dL (ref 8.9–10.3)
CHLORIDE: 108 mmol/L (ref 101–111)
CO2: 29 mmol/L (ref 22–32)
CO2: 30 mmol/L (ref 22–32)
Calcium: 9.5 mg/dL (ref 8.9–10.3)
Chloride: 96 mmol/L — ABNORMAL LOW (ref 101–111)
Creatinine, Ser: 1.19 mg/dL (ref 0.61–1.24)
Creatinine, Ser: 0.97 mg/dL (ref 0.61–1.24)
GFR calc Af Amer: >60 mL/min (ref >60)
GFR calc non Af Amer: >60 mL/min (ref >60)
GLUCOSE: 811 mg/dL — AB (ref 65–99)
Glucose, Bld: 189 mg/dL — ABNORMAL HIGH (ref 65–99)
POTASSIUM: 3.2 mmol/L — AB (ref 3.5–5.1)
Potassium: 4.2 mmol/L (ref 3.5–5.1)
SODIUM: 137 mmol/L (ref 135–145)
SODIUM: 147 mmol/L — AB (ref 135–145)

## 2015-11-30 LAB — URINALYSIS COMPLETE WITH MICROSCOPIC (ARMC ONLY)
BACTERIA UA: NONE SEEN
BILIRUBIN URINE: NEGATIVE
Hgb urine dipstick: NEGATIVE
Leukocytes, UA: NEGATIVE
Nitrite: NEGATIVE
PH: 6 (ref 5.0–8.0)
Protein, ur: NEGATIVE mg/dL
SQUAMOUS EPITHELIAL / LPF: NONE SEEN
Specific Gravity, Urine: 1.03 (ref 1.005–1.030)

## 2015-11-30 LAB — URINE DRUG SCREEN, QUALITATIVE (ARMC ONLY)
AMPHETAMINES, UR SCREEN: NOT DETECTED
Barbiturates, Ur Screen: NOT DETECTED
Benzodiazepine, Ur Scrn: NOT DETECTED
CANNABINOID 50 NG, UR ~~LOC~~: NOT DETECTED
COCAINE METABOLITE, UR ~~LOC~~: POSITIVE — AB
MDMA (ECSTASY) UR SCREEN: NOT DETECTED
Methadone Scn, Ur: NOT DETECTED
Opiate, Ur Screen: NOT DETECTED
Phencyclidine (PCP) Ur S: NOT DETECTED
TRICYCLIC, UR SCREEN: NOT DETECTED

## 2015-11-30 LAB — CBC
HCT: 39.9 % — ABNORMAL LOW (ref 40.0–52.0)
HEMOGLOBIN: 13.2 g/dL (ref 13.0–18.0)
MCH: 30.9 pg (ref 26.0–34.0)
MCHC: 33 g/dL (ref 32.0–36.0)
MCV: 93.6 fL (ref 80.0–100.0)
Platelets: 150 10*3/uL (ref 150–440)
RBC: 4.27 MIL/uL — ABNORMAL LOW (ref 4.40–5.90)
RDW: 13.1 % (ref 11.5–14.5)
WBC: 8 10*3/uL (ref 3.8–10.6)

## 2015-11-30 LAB — TROPONIN I: Troponin I: <0.03 ng/mL (ref <0.031)

## 2015-11-30 LAB — MRSA PCR SCREENING: MRSA BY PCR: NEGATIVE

## 2015-11-30 LAB — SEDIMENTATION RATE: Sed Rate: 77 mm/hr — ABNORMAL HIGH (ref 0–15)

## 2015-11-30 MED ORDER — EZETIMIBE 10 MG PO TABS
10.0000 mg | ORAL_TABLET | Freq: Every day | ORAL | Status: DC
  Administered 2015-12-01 – 2015-12-02 (×2): 10 mg via ORAL
  Filled 2015-11-30 (×2): qty 1

## 2015-11-30 MED ORDER — ALBUTEROL SULFATE (2.5 MG/3ML) 0.083% IN NEBU: 2.5000 mg | INHALATION_SOLUTION | Freq: Four times a day (QID) | RESPIRATORY_TRACT | Status: DC | PRN

## 2015-11-30 MED ORDER — POTASSIUM CHLORIDE 10 MEQ/100ML IV SOLN
10.0000 meq | INTRAVENOUS | Status: AC
  Administered 2015-12-01 (×4): 10 meq via INTRAVENOUS
  Filled 2015-11-30 (×4): qty 100

## 2015-11-30 MED ORDER — METHOCARBAMOL 500 MG PO TABS
500.0000 mg | ORAL_TABLET | Freq: Three times a day (TID) | ORAL | Status: DC | PRN
  Filled 2015-11-30: qty 1

## 2015-11-30 MED ORDER — INSULIN ASPART 100 UNIT/ML ~~LOC~~ SOLN
6.0000 [IU] | Freq: Once | SUBCUTANEOUS | Status: AC
  Administered 2015-11-30: 6 [IU] via INTRAVENOUS
  Filled 2015-11-30: qty 6

## 2015-11-30 MED ORDER — LORATADINE 10 MG PO TABS: 10.0000 mg | ORAL_TABLET | Freq: Every day | ORAL | Status: DC | PRN

## 2015-11-30 MED ORDER — SODIUM CHLORIDE 0.9 % IV SOLN
INTRAVENOUS | Status: DC
  Administered 2015-11-30: 4.7 [IU]/h via INTRAVENOUS
  Filled 2015-11-30: qty 2.5

## 2015-11-30 MED ORDER — DARIFENACIN HYDROBROMIDE ER 7.5 MG PO TB24
7.5000 mg | ORAL_TABLET | Freq: Every day | ORAL | Status: DC
  Administered 2015-12-01 – 2015-12-02 (×2): 7.5 mg via ORAL
  Filled 2015-11-30 (×3): qty 1

## 2015-11-30 MED ORDER — MOMETASONE FURO-FORMOTEROL FUM 100-5 MCG/ACT IN AERO
2.0000 | INHALATION_SPRAY | Freq: Two times a day (BID) | RESPIRATORY_TRACT | Status: DC
  Administered 2015-12-01 – 2015-12-02 (×3): 2 via RESPIRATORY_TRACT
  Filled 2015-11-30: qty 8.8

## 2015-11-30 MED ORDER — DEXTROSE-NACL 5-0.45 % IV SOLN: INTRAVENOUS | Status: DC

## 2015-11-30 MED ORDER — DEXTROSE-NACL 5-0.45 % IV SOLN
INTRAVENOUS | Status: DC
  Administered 2015-11-30: 21:00:00 via INTRAVENOUS

## 2015-11-30 MED ORDER — BUPROPION HCL ER (XL) 150 MG PO TB24
300.0000 mg | ORAL_TABLET | Freq: Every day | ORAL | Status: DC
  Administered 2015-12-01 – 2015-12-02 (×2): 300 mg via ORAL
  Filled 2015-11-30: qty 2
  Filled 2015-11-30: qty 1

## 2015-11-30 MED ORDER — MORPHINE SULFATE (PF) 2 MG/ML IV SOLN: 2.0000 mg | INTRAVENOUS | Status: DC | PRN

## 2015-11-30 MED ORDER — SODIUM CHLORIDE 0.9 % IV BOLUS (SEPSIS)
1000.0000 mL | Freq: Once | INTRAVENOUS | Status: AC
Start: 2015-11-30 — End: 2015-11-30
  Administered 2015-11-30: 1000 mL via INTRAVENOUS

## 2015-11-30 MED ORDER — ACETAMINOPHEN 325 MG PO TABS: 650.0000 mg | ORAL_TABLET | Freq: Four times a day (QID) | ORAL | Status: DC | PRN

## 2015-11-30 MED ORDER — SODIUM CHLORIDE 0.9 % IV SOLN
INTRAVENOUS | Status: DC
  Administered 2015-11-30: 18:00:00 via INTRAVENOUS

## 2015-11-30 MED ORDER — MELOXICAM 7.5 MG PO TABS
15.0000 mg | ORAL_TABLET | Freq: Every day | ORAL | Status: DC
  Administered 2015-12-01 – 2015-12-02 (×2): 15 mg via ORAL
  Filled 2015-11-30 (×2): qty 2

## 2015-11-30 MED ORDER — SODIUM CHLORIDE 0.9 % IV SOLN
INTRAVENOUS | Status: DC
  Administered 2015-11-30: 2.2 [IU]/h via INTRAVENOUS
  Administered 2015-11-30: 3.4 [IU]/h via INTRAVENOUS
  Filled 2015-11-30: qty 2.5

## 2015-11-30 MED ORDER — PANTOPRAZOLE SODIUM 40 MG PO TBEC
40.0000 mg | DELAYED_RELEASE_TABLET | Freq: Every day | ORAL | Status: DC
  Administered 2015-12-01 – 2015-12-02 (×2): 40 mg via ORAL
  Filled 2015-11-30 (×3): qty 1

## 2015-11-30 MED ORDER — ONDANSETRON HCL 4 MG PO TABS: 4.0000 mg | ORAL_TABLET | Freq: Four times a day (QID) | ORAL | Status: DC | PRN

## 2015-11-30 MED ORDER — FLUTICASONE PROPIONATE 50 MCG/ACT NA SUSP
2.0000 | Freq: Every day | NASAL | Status: DC | PRN
  Filled 2015-11-30: qty 16

## 2015-11-30 MED ORDER — ALBUTEROL SULFATE HFA 108 (90 BASE) MCG/ACT IN AERS: 2.0000 | INHALATION_SPRAY | Freq: Four times a day (QID) | RESPIRATORY_TRACT | Status: DC | PRN

## 2015-11-30 MED ORDER — SERTRALINE HCL 100 MG PO TABS
100.0000 mg | ORAL_TABLET | Freq: Every day | ORAL | Status: DC
  Administered 2015-12-01: 100 mg via ORAL
  Filled 2015-11-30: qty 1

## 2015-11-30 MED ORDER — PIPERACILLIN-TAZOBACTAM 3.375 G IVPB
3.3750 g | Freq: Three times a day (TID) | INTRAVENOUS | Status: DC
  Administered 2015-11-30 – 2015-12-02 (×5): 3.375 g via INTRAVENOUS
  Filled 2015-11-30 (×7): qty 50

## 2015-11-30 MED ORDER — ZOLPIDEM TARTRATE 5 MG PO TABS: 5.0000 mg | ORAL_TABLET | Freq: Every evening | ORAL | Status: DC | PRN

## 2015-11-30 MED ORDER — ARIPIPRAZOLE 9.75 MG/1.3ML IM SOLN: 9.7500 mg | INTRAMUSCULAR | Status: DC

## 2015-11-30 MED ORDER — TRAMADOL HCL 50 MG PO TABS: 50.0000 mg | ORAL_TABLET | Freq: Four times a day (QID) | ORAL | Status: DC | PRN

## 2015-11-30 MED ORDER — KCL IN DEXTROSE-NACL 40-5-0.45 MEQ/L-%-% IV SOLN
INTRAVENOUS | Status: DC
  Administered 2015-12-01: via INTRAVENOUS
  Filled 2015-11-30 (×4): qty 1000

## 2015-11-30 MED ORDER — TOBRAMYCIN 0.3 % OP SOLN
1.0000 [drp] | OPHTHALMIC | Status: DC
  Administered 2015-11-30 – 2015-12-02 (×8): 1 [drp] via OPHTHALMIC
  Filled 2015-11-30: qty 5

## 2015-11-30 MED ORDER — INSULIN REGULAR BOLUS VIA INFUSION
0.0000 [IU] | Freq: Three times a day (TID) | INTRAVENOUS | Status: DC
  Administered 2015-12-01: 10 [IU] via INTRAVENOUS
  Filled 2015-11-30: qty 10

## 2015-11-30 MED ORDER — ACETAMINOPHEN 650 MG RE SUPP
650.0000 mg | Freq: Four times a day (QID) | RECTAL | Status: DC | PRN
Start: 2015-11-30 — End: 2015-12-02

## 2015-11-30 MED ORDER — ONDANSETRON HCL 4 MG/2ML IJ SOLN: 4.0000 mg | Freq: Four times a day (QID) | INTRAMUSCULAR | Status: DC | PRN

## 2015-11-30 MED ORDER — OXYCODONE HCL 5 MG PO TABS
5.0000 mg | ORAL_TABLET | ORAL | Status: DC | PRN
  Administered 2015-11-30 – 2015-12-02 (×2): 5 mg via ORAL
  Filled 2015-11-30 (×2): qty 1

## 2015-11-30 MED ORDER — SODIUM CHLORIDE 0.9 % IV BOLUS (SEPSIS)
1000.0000 mL | Freq: Once | INTRAVENOUS | Status: AC
  Administered 2015-11-30: 1000 mL via INTRAVENOUS

## 2015-11-30 MED ORDER — ENOXAPARIN SODIUM 40 MG/0.4ML ~~LOC~~ SOLN
40.0000 mg | SUBCUTANEOUS | Status: DC
  Administered 2015-11-30 – 2015-12-01 (×2): 40 mg via SUBCUTANEOUS
  Filled 2015-11-30 (×3): qty 0.4

## 2015-11-30 MED ORDER — DEXTROSE 50 % IV SOLN: 25.0000 mL | INTRAVENOUS | Status: DC | PRN

## 2015-11-30 MED ORDER — GABAPENTIN 300 MG PO CAPS
900.0000 mg | ORAL_CAPSULE | Freq: Two times a day (BID) | ORAL | Status: DC
  Administered 2015-12-01 – 2015-12-02 (×3): 900 mg via ORAL
  Filled 2015-11-30 (×3): qty 3

## 2015-11-30 NOTE — ED Notes (Signed)
Dr. Huel CoteQuigley aware of boot status. Nurse instructed not to remove or alter boot.

## 2015-11-30 NOTE — Progress Notes (Signed)
Cast removed - apparently in place for about 2 weeks but patient has lost weight and been having irration Foot ulcer - plantar surface base of 2,3,4 digit well healed  However on anterior shin approx 3x3 cm ulcer with surrounding erythema Given new finding will check xray, start abx for cellulitis but concern for possible osteo given esr and duration
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
## 2015-11-30 NOTE — ED Provider Notes (Signed)
Time Seen: Approximately 15 20  I have reviewed the triage notes  Chief Complaint: Hyperglycemia and Chest Pain   History of Present Illness: Wayne [name] is a 40 y.o. male who has a history of insulin-dependent diabetes and polysubstance abuse. The patient states that he has not taken his insulin for the past week. He is originally from [place] where he was under treatment for diabetic foot ulcer. He states he came to [facility] to "" shoplift "". Patient was caught shoplifting this morning and was taken to the triage area of the contained med Center and was sent here for further evaluation for her hypotension and tachycardia. Patient admits to cocaine this morning. He denies any chest pain or shortness of breath. He does describe frequent urination and thirst. He is not aware of any fever or back or flank pain.   Past Medical History  Diagnosis Date  . Depression   . Diabetes mellitus   . Hyperlipidemia   . HTN (hypertension)   . Chronic headaches   . Osteoarthritis   . Kidney stones   . Polysubstance abuse     cocaine, marijuana-quit summer 2010  . Tobacco abuse   . Asthma   . Chest pain   . Lumbar disc disease   . GERD (gastroesophageal reflux disease)   . OSA (obstructive sleep apnea)   . Peripheral neuropathy ([place]) 02/01/2011  . Bipolar 1 disorder ([place])   . Ingrown nail 11/12/2013    Patient Active Problem List   Diagnosis Date Noted  . Callus of foot 01/15/2015  . Major depressive disorder, recurrent, severe without psychotic features ([place])   . Recurrent major depression-severe ([place]) 08/05/2014  . Homicidal ideation   . Hyperglycemia   . Suicidal ideation   . Major depressive disorder, recurrent severe without psychotic features ([place])   . Keratoma 05/15/2014  . Onychomycosis 01/04/2014  . Pain in lower limb 01/04/2014  . Metatarsal deformity 01/04/2014  . Ingrown nail 11/12/2013  . Onychia and paronychia of toe 11/12/2013  . Deformity of metatarsal bone of  right foot 08/17/2013  . Low TSH level 07/29/2011  . Drug overdose 07/24/2011  . Toxic encephalopathy 07/24/2011  . Diabetes mellitus due to underlying condition with complication ([place]) 08/03/3233  . Acute renal failure ([place]) 07/24/2011  . Hyponatremia 07/24/2011  . Anemia 07/24/2011  . Neck pain 05/21/2011  . Hand pain, left 05/21/2011  . Asthma 04/27/2011  . Preventative health care 04/27/2011  . Peripheral neuropathy ([place]) 02/01/2011  . GERD 07/10/2010  . OBSTRUCTIVE SLEEP APNEA 09/26/2009  . Backache 06/27/2009  . IBS 06/10/2009  . SCIATICA, RIGHT 06/02/2009  . SUBSTANCE ABUSE, MULTIPLE 01/10/2009  . CHEST PAIN 01/10/2009  . FRACTURE, INDEX FINGER, RIGHT 01/10/2009  . LUNG NODULE 12/04/2007  . SHOULDER PAIN, RIGHT 12/04/2007  . HYPOGONADISM, MALE 10/09/2007  . ERECTILE DYSFUNCTION 09/14/2007  . DIABETES MELLITUS, TYPE II 04/12/2007  . HYPERLIPIDEMIA 04/12/2007  . Depression, major, recurrent ([place]) 04/12/2007  . Essential hypertension 04/12/2007  . OSTEOARTHRITIS 04/12/2007  . Headache(784.0) 04/12/2007  . RENAL CALCULUS, HX OF 04/12/2007    Past Surgical History  Procedure Laterality Date  . Knee surgery    . Bunionectomy  06-2009    Past Surgical History  Procedure Laterality Date  . Knee surgery    . Bunionectomy  06-2009    Current Outpatient Rx  Name  Route  Sig  Dispense  Refill  . albuterol (PROVENTIL HFA;VENTOLIN HFA) 108 (90 BASE) MCG/ACT inhaler   Inhalation  Inhale 2 puffs into the lungs every 6 (six) hours as needed for wheezing (wheezing). For shortness of breath   1 Inhaler   2   . ARIPiprazole (ABILIFY) 10 MG tablet   Oral   Take 1 tablet (10 mg total) by mouth 2 (two) times daily. Patient not taking: Reported on 08/16/2015   60 tablet   0   . ARIPiprazole (ABILIFY) 9.75 MG/1.3ML injection   Intramuscular   Inject 9.75 mg into the muscle every 30 (thirty) days.          . Blood Glucose Monitoring Suppl (TRUE METRIX METER) W/DEVICE  KIT   Does not apply   1 Device by Does not apply route as needed. Patient not taking: Reported on 08/16/2015   1 kit   0   . budesonide-formoterol (SYMBICORT) 80-4.5 MCG/ACT inhaler   Inhalation   Inhale 2 puffs into the lungs 2 (two) times daily. Patient taking differently: Inhale 2 puffs into the lungs 2 (two) times daily as needed (for wheezing).    1 Inhaler   12   . buPROPion (WELLBUTRIN XL) 300 MG 24 hr tablet   Oral   Take 1 tablet (300 mg total) by mouth daily.   30 tablet   0   . ezetimibe (ZETIA) 10 MG tablet   Oral   Take 1 tablet (10 mg total) by mouth daily.   30 tablet   1   . fluticasone (FLONASE) 50 MCG/ACT nasal spray   Each Nare   Place 2 sprays into both nostrils daily. Patient taking differently: Place 2 sprays into both nostrils daily as needed for allergies.       2   . gabapentin (NEURONTIN) 300 MG capsule   Oral   Take 2 capsules (600 mg total) by mouth 3 (three) times daily. Patient taking differently: Take 900 mg by mouth 2 (two) times daily.    180 capsule   1   . glucose blood test strip      Use as instructed Patient not taking: Reported on 08/16/2015   100 each   0   . glucose monitoring kit (FREESTYLE) monitoring kit   Does not apply   1 each by Does not apply route as needed for other. For daily blood glucose monitoring related to diabetes. Patient not taking: Reported on 08/16/2015   1 each   1   . insulin aspart (NOVOLOG) 100 UNIT/ML injection   Subcutaneous   Inject 10 Units into the skin 3 (three) times daily before meals. Patient taking differently: Inject 35-45 Units into the skin 3 (three) times daily before meals. Sliding scale   3 vial   2   . Insulin Degludec (TRESIBA FLEXTOUCH) 100 UNIT/ML SOPN   Subcutaneous   Inject 45 Units into the skin at bedtime.         . insulin glargine (LANTUS) 100 UNIT/ML injection   Subcutaneous   Inject 0.45 mLs (45 Units total) into the skin at bedtime. Patient not taking:  Reported on 08/16/2015   10 mL   4   . Lancet Devices MISC      Use PRN for blood glucose monitoring Patient not taking: Reported on 08/16/2015   100 each   0   . loratadine (CLARITIN) 10 MG tablet   Oral   Take 1 tablet (10 mg total) by mouth daily. Patient taking differently: Take 10 mg by mouth daily as needed for allergies.          [name]  meloxicam (MOBIC) 15 MG tablet   Oral   Take 15 mg by mouth daily.         . metFORMIN (GLUCOPHAGE) 1000 MG tablet   Oral   Take 1 tablet (1,000 mg total) by mouth 2 (two) times daily with a meal.   60 tablet   2   . methocarbamol (ROBAXIN) 500 MG tablet   Oral   Take 1 tablet (500 mg total) by mouth every 8 (eight) hours as needed for muscle spasms (muscle spasms).   90 tablet   1   . Multiple Vitamin (MULTIVITAMIN WITH MINERALS) TABS tablet   Oral   Take 1 tablet by mouth daily. Patient not taking: Reported on 01/06/2015         . omeprazole (PRILOSEC) 40 MG capsule   Oral   Take 40 mg by mouth daily.         . saxagliptin HCl (ONGLYZA) 5 MG TABS tablet   Oral   Take 1 tablet (5 mg total) by mouth daily.   30 tablet   1   . sertraline (ZOLOFT) 100 MG tablet   Oral   Take 1 tablet (100 mg total) by mouth at bedtime.   30 tablet   0   . solifenacin (VESICARE) 5 MG tablet   Oral   Take 1 tablet (5 mg total) by mouth daily.   30 tablet   2   . tobramycin (TOBREX) 0.3 % ophthalmic solution   Left Eye   Place 1 drop into the left eye every 4 (four) hours.   5 mL   1   . traMADol (ULTRAM) 50 MG tablet   Oral   Take 1 tablet by mouth every 6 (six) hours as needed. pain      0   . zolpidem (AMBIEN) 5 MG tablet   Oral   Take 1 tablet (5 mg total) by mouth at bedtime as needed for sleep.   30 tablet   0     Allergies:  Lipitor; Nystatin; Statins; and Iodine  Family History: Family History  Problem Relation Age of Onset  . Heart disease Mother   . Diabetes Father   . Hyperlipidemia Father   .  Emphysema Maternal Uncle   . Asthma Sister   . Asthma Mother   . Stroke Mother   . Hypertension Father   . Alzheimer's disease Maternal Grandmother   . Depression Father   . Breast cancer Maternal Aunt   . COPD Mother     maternal un    Social History: Social History  Substance Use Topics  . Smoking status: Current Some Day Smoker -- 0.20 packs/day for 17 years    Types: Cigarettes    Last Attempt to Quit: 02/24/2011  . Smokeless tobacco: Former Systems developer  . Alcohol Use: No     Comment: socially     Review of Systems:   10 point review of systems was performed and was otherwise negative:  Constitutional: No fever Eyes: No visual disturbances ENT: No sore throat, ear pain Cardiac: No chest pain Respiratory: Mild shortness of breath or wheezing Abdomen: No abdominal pain, no vomiting, No diarrhea Endocrine: No weight loss, No night sweats Extremities: No peripheral edema, cyanosis Skin: No rashes, easy bruising Neurologic: No new focal weakness, trouble with speech or swollowing Urologic: No dysuria, Hematuria, or urinary frequency Diabetic foot ulcer with bleed on the right lower extremity  Physical Exam:  ED Triage Vitals  Enc Vitals Group  BP 11/30/15 1516 115/91 mmHg     Pulse Rate 11/30/15 1516 105     Resp 11/30/15 1516 15     Temp 11/30/15 1518 98.3 F (36.8 C)     Temp Source 11/30/15 1518 Oral     SpO2 11/30/15 1516 97 %     Weight 11/30/15 1516 160 lb (72.576 kg)     Height 11/30/15 1516 '5\' 11"'  (1.803 m)     Head Cir --      Peak Flow --      Pain Score --      Pain Loc --      Pain Edu? --      Excl. in [place]? --     General: Awake , Alert , and Oriented times 3; GCS 15 Head: Normal cephalic , atraumatic Eyes: Pupils equal , round, reactive to light Nose/Throat: No nasal drainage, patent upper airway without erythema or exudate. Dry mucous membranes Neck: Supple, Full range of motion, No anterior adenopathy or palpable thyroid masses Lungs: Clear  to ascultation without wheezes , rhonchi, or rales Heart: Tachycardia without murmurs , gallops , or rubs Abdomen: Soft, non tender without rebound, guarding , or rigidity; bowel sounds positive and symmetric in all 4 quadrants. No organomegaly .        Extremities: Rapid bandage on the right lower extremity which will be examined later left lower extremity appears to be within normal limits Neurologic: , Motor symmetric without deficits, sensory intact Skin: warm, dry, no rashes   Labs:   All laboratory work was reviewed including any pertinent negatives or positives listed below:  Labs Reviewed  GLUCOSE, CAPILLARY - Abnormal; Notable for the following:    Glucose-Capillary >600 (*)    All other components within normal limits  BASIC METABOLIC PANEL - Abnormal; Notable for the following:    Chloride 96 (*)    Glucose, Bld 811 (*)    All other components within normal limits  CBC - Abnormal; Notable for the following:    RBC 4.27 (*)    HCT 39.9 (*)    All other components within normal limits  URINALYSIS COMPLETEWITH MICROSCOPIC ([facility] ONLY) - Abnormal; Notable for the following:    Color, Urine COLORLESS (*)    APPearance CLEAR (*)    Glucose, UA >500 (*)    Ketones, ur 1+ (*)    All other components within normal limits  BLOOD GAS, VENOUS - Abnormal; Notable for the following:    pH, Ven 7.31 (*)    pCO2, Ven 62 (*)    Bicarbonate 31.2 (*)    All other components within normal limits  URINE DRUG SCREEN, QUALITATIVE ([facility] ONLY) - Abnormal; Notable for the following:    Cocaine Metabolite,Ur [place] POSITIVE (*)    All other components within normal limits  TROPONIN I  CBG MONITORING, ED   Review of patient's laboratory work shows findings consistent with diabetic ketoacidosis EKG: ED ECG REPORT I, Daymon Larsen, the attending physician, personally viewed and interpreted this ECG.  Date: 11/30/2015 EKG Time: 1516 Rate: 106 Rhythm: normal sinus rhythm QRS Axis:  normal Intervals: normal ST/T Wave abnormalities: Diffuse mild ST elevation Conduction Disturbances: none Narrative Interpretation: unremarkable    Radiology:  Chest x-ray is pending  I personally reviewed the radiologic studies    Critical Care:  CRITICAL CARE Performed by: Daymon Larsen   Total critical care time: 35 minutes  Critical care time was exclusive of separately billable procedures and treating  other patients.  Critical care was necessary to treat or prevent imminent or life-threatening deterioration.  Critical care was time spent personally by me on the following activities: development of treatment plan with patient and/or surrogate as well as nursing, discussions with consultants, evaluation of patient's response to treatment, examination of patient, obtaining history from patient or surrogate, ordering and performing treatments and interventions, ordering and review of laboratory studies, ordering and review of radiographic studies, pulse oximetry and re-evaluation of patient's condition. Initial management and treatment for diabetic ketoacidosis    ED Course:  Patient significantly hyperglycemic and dehydrated. He is initiated on IV fluid bolus along with first an insulin bolus and then will be placed on an insulin drip.   Assessment:  Insulin-dependent diabetic Polysubstance abuse Mild diabetic ketoacidosis with non-anion gap  Final Clinical Impression:   Final diagnoses:  Diabetic ketoacidosis without coma associated with type 1 diabetes mellitus [facility])     Plan: Inpatient management            Daymon Larsen, MD 11/30/15 [phone]

## 2015-11-30 NOTE — ED Notes (Signed)
811 glucose. MD notified.

## 2015-11-30 NOTE — Progress Notes (Signed)
After patient arrived on unit contacted Dr. Clint GuyHower about clarification of IV fluids order, earlier venous blood gas with pending O2 value (MD did not want a recheck), and cast upon right leg (MD ordered to remove cast). After cast removed, diabetic ulcer on foot appears pink and scarred over, but wound on right anterior shin present where edge of cast appears to have rubbed against skin. That wound reddened around wound, yellow, and very tender to touch. Notified MD about wound and MD, Dr. Clint GuyHower, stated he would come to look at newly revealed wound. Per MD, cleaned leg and wounds and covered open shin wound with dry gauze until wound care can see patient and make recommendations.

## 2015-11-30 NOTE — H&P (Signed)
[facility] NAME: Wayne [name]    MR#:  [id number]  DATE OF BIRTH:  11/16/1975   DATE OF ADMISSION:  11/30/2015  PRIMARY CARE PHYSICIAN: Donnie Coffin, MD   REQUESTING/REFERRING PHYSICIAN: Marcelene Butte  CHIEF COMPLAINT:   Chief Complaint  Patient presents with  . Hyperglycemia  . Chest Pain    HISTORY OF PRESENT ILLNESS:  Wayne [name]  is a 40 y.o. male with a known history of Type 2 diabetes insulin requiring, medical noncompliance, substance abuse who is presenting with elevated blood glucose. The patient is a known type II diabetic noncompliant with insulin who was originally picked up by police earlier today for attending to [place] [facility]. While in police holding his blood sugar check read "high" subsequently sent to the Hospital further workup and evaluation. Glucose greater than than 800. Of note the patient is also undergoing treatment for diabetic foot ulcer denies any recent fevers chills or symptomatology related to it. Emergency department course: Started on insulin drip  PAST MEDICAL HISTORY:   Past Medical History  Diagnosis Date  . Depression   . Diabetes mellitus   . Hyperlipidemia   . HTN (hypertension)   . Chronic headaches   . Osteoarthritis   . Kidney stones   . Polysubstance abuse     cocaine, marijuana-quit summer 2010  . Tobacco abuse   . Asthma   . Chest pain   . Lumbar disc disease   . GERD (gastroesophageal reflux disease)   . OSA (obstructive sleep apnea)   . Peripheral neuropathy ([place]) 02/01/2011  . Bipolar 1 disorder ([place])   . Ingrown nail 11/12/2013    PAST SURGICAL HISTORY:   Past Surgical History  Procedure Laterality Date  . Knee surgery    . Bunionectomy  06-2009    SOCIAL HISTORY:   Social History  Substance Use Topics  . Smoking status: Current Some Day Smoker -- 0.20 packs/day for 17 years    Types: Cigarettes    Last Attempt to Quit: 02/24/2011  . Smokeless tobacco:  Former Systems developer  . Alcohol Use: No     Comment: socially    FAMILY HISTORY:   Family History  Problem Relation Age of Onset  . Heart disease Mother   . Diabetes Father   . Hyperlipidemia Father   . Emphysema Maternal Uncle   . Asthma Sister   . Asthma Mother   . Stroke Mother   . Hypertension Father   . Alzheimer's disease Maternal Grandmother   . Depression Father   . Breast cancer Maternal Aunt   . COPD Mother     maternal un    DRUG ALLERGIES:   Allergies  Allergen Reactions  . Lipitor [Atorvastatin] Other (See Comments)    All statins. Muscle weakness.   . Nystatin Other (See Comments)    Muscle weakness  . Statins Other (See Comments)    Muscle weakness, stomach cramps  . Iodine Rash    REVIEW OF SYSTEMS:  REVIEW OF SYSTEMS: Fairly unreliable CONSTITUTIONAL: Denies fevers, chills,Positive fatigue, weakness.  EYES: Denies blurred vision, double vision, or eye pain.  EARS, NOSE, THROAT: Denies tinnitus, ear pain, hearing loss.  RESPIRATORY: denies cough, shortness of breath, wheezing  CARDIOVASCULAR: Denies chest pain, palpitations, edema.  GASTROINTESTINAL: Denies nausea, vomiting, diarrhea, abdominal pain.  GENITOURINARY: Denies dysuria, hematuria.  ENDOCRINE: Denies nocturia or thyroid problems. HEMATOLOGIC AND LYMPHATIC: Denies easy bruising or bleeding.  SKIN: Denies rash or lesions.  MUSCULOSKELETAL: Denies pain in neck, back, shoulder, knees, hips, or further arthritic symptoms.  NEUROLOGIC: Denies paralysis, paresthesias.  PSYCHIATRIC: Denies anxiety or depressive symptoms. Otherwise full review of systems performed by me is negative.   MEDICATIONS AT HOME:   Prior to Admission medications   Medication Sig Start Date End Date Taking? Authorizing Provider  albuterol (PROVENTIL HFA;VENTOLIN HFA) 108 (90 BASE) MCG/ACT inhaler Inhale 2 puffs into the lungs every 6 (six) hours as needed for wheezing (wheezing). For shortness of breath 01/09/15   Elmyra Ricks  Pisciotta, PA-C  ARIPiprazole (ABILIFY) 10 MG tablet Take 1 tablet (10 mg total) by mouth 2 (two) times daily. Patient not taking: Reported on 08/16/2015 08/09/14   Niel Hummer, NP  ARIPiprazole (ABILIFY) 9.75 MG/1.3ML injection Inject 9.75 mg into the muscle every 30 (thirty) days.     Historical Provider, MD  Blood Glucose Monitoring Suppl (TRUE METRIX METER) W/DEVICE KIT 1 Device by Does not apply route as needed. Patient not taking: Reported on 08/16/2015 01/09/15   Debby Freiberg, MD  budesonide-formoterol [facility]) 80-4.5 MCG/ACT inhaler Inhale 2 puffs into the lungs 2 (two) times daily. Patient taking differently: Inhale 2 puffs into the lungs 2 (two) times daily as needed (for wheezing).  01/09/15   Nicole Pisciotta, PA-C  buPROPion (WELLBUTRIN XL) 300 MG 24 hr tablet Take 1 tablet (300 mg total) by mouth daily. 08/09/14   Niel Hummer, NP  ezetimibe (ZETIA) 10 MG tablet Take 1 tablet (10 mg total) by mouth daily. 01/15/15   Arnoldo Morale, MD  fluticasone (FLONASE) 50 MCG/ACT nasal spray Place 2 sprays into both nostrils daily. Patient taking differently: Place 2 sprays into both nostrils daily as needed for allergies.  08/09/14   Niel Hummer, NP  gabapentin (NEURONTIN) 300 MG capsule Take 2 capsules (600 mg total) by mouth 3 (three) times daily. Patient taking differently: Take 900 mg by mouth 2 (two) times daily.  01/15/15   Arnoldo Morale, MD  glucose blood test strip Use as instructed Patient not taking: Reported on 08/16/2015 01/09/15   Debby Freiberg, MD  glucose monitoring kit (FREESTYLE) monitoring kit 1 each by Does not apply route as needed for other. For daily blood glucose monitoring related to diabetes. Patient not taking: Reported on 08/16/2015 01/09/15   Elmyra Ricks Pisciotta, PA-C  insulin aspart (NOVOLOG) 100 UNIT/ML injection Inject 10 Units into the skin 3 (three) times daily before meals. Patient taking differently: Inject 35-45 Units into the skin 3 (three) times daily before meals.  Sliding scale 02/05/15   Arnoldo Morale, MD  Insulin Degludec (TRESIBA FLEXTOUCH) 100 UNIT/ML SOPN Inject 45 Units into the skin at bedtime.    Historical Provider, MD  insulin glargine (LANTUS) 100 UNIT/ML injection Inject 0.45 mLs (45 Units total) into the skin at bedtime. Patient not taking: Reported on 08/16/2015 01/09/15 03/29/16  Monico Blitz, PA-C  Lancet Devices MISC Use PRN for blood glucose monitoring Patient not taking: Reported on 08/16/2015 01/09/15   Debby Freiberg, MD  loratadine (CLARITIN) 10 MG tablet Take 1 tablet (10 mg total) by mouth daily. Patient taking differently: Take 10 mg by mouth daily as needed for allergies.  08/09/14   Niel Hummer, NP  meloxicam (MOBIC) 15 MG tablet Take 15 mg by mouth daily.    Historical Provider, MD  metFORMIN (GLUCOPHAGE) 1000 MG tablet Take 1 tablet (1,000 mg total) by mouth 2 (two) times daily with a meal. 01/15/15   Arnoldo Morale, MD  methocarbamol (ROBAXIN) 500 MG tablet Take  1 tablet (500 mg total) by mouth every 8 (eight) hours as needed for muscle spasms (muscle spasms). 01/15/15   Arnoldo Morale, MD  Multiple Vitamin (MULTIVITAMIN WITH MINERALS) TABS tablet Take 1 tablet by mouth daily. Patient not taking: Reported on 01/06/2015 08/09/14   Niel Hummer, NP  omeprazole (PRILOSEC) 40 MG capsule Take 40 mg by mouth daily.    Historical Provider, MD  saxagliptin HCl (ONGLYZA) 5 MG TABS tablet Take 1 tablet (5 mg total) by mouth daily. 01/15/15   Arnoldo Morale, MD  sertraline (ZOLOFT) 100 MG tablet Take 1 tablet (100 mg total) by mouth at bedtime. 08/09/14   Niel Hummer, NP  solifenacin (VESICARE) 5 MG tablet Take 1 tablet (5 mg total) by mouth daily. 02/03/15   Arnoldo Morale, MD  tobramycin (TOBREX) 0.3 % ophthalmic solution Place 1 drop into the left eye every 4 (four) hours. 08/19/15   Nat Christen, MD  traMADol (ULTRAM) 50 MG tablet Take 1 tablet by mouth every 6 (six) hours as needed. pain 11/18/14   Historical Provider, MD  zolpidem (AMBIEN) 5 MG tablet  Take 1 tablet (5 mg total) by mouth at bedtime as needed for sleep. 08/09/14   Niel Hummer, NP      VITAL SIGNS:  Blood pressure 115/91, pulse 105, temperature 98.3 F (36.8 C), temperature source Oral, resp. rate 15, height '5\' 11"'$  (1.803 m), weight 72.576 kg (160 lb), SpO2 97 %.  PHYSICAL EXAMINATION:  VITAL SIGNS: Filed Vitals:   11/30/15 1516 11/30/15 1518  BP: 115/91   Pulse: 105   Temp:  98.3 F (36.8 C)  Resp: 15    GENERAL:39 y.o.male currently in Moderate acute distress. Given current mental status  HEAD: Normocephalic, atraumatic.  EYES: Pupils equal, round, reactive to light. Extraocular muscles intact. No scleral icterus.  MOUTH: Dry mucosal membrane. Dentition intact. No abscess noted.  EAR, NOSE, THROAT: Clear without exudates. No external lesions.  NECK: Supple. No thyromegaly. No nodules. No JVD.  PULMONARY: Clear to ascultation, without wheeze rails or rhonci. No use of accessory muscles, Good respiratory effort. good air entry bilaterally CHEST: Nontender to palpation.  CARDIOVASCULAR: S1 and S2. Tachycardic. No murmurs, rubs, or gallops. No edema. Pedal pulses 2+ bilaterally.  GASTROINTESTINAL: Soft, nontender, nondistended. No masses. Positive bowel sounds. No hepatosplenomegaly.  MUSCULOSKELETAL: No swelling, clubbing, or edema. Range of motion full in all extremities. Unna boot left leg  NEUROLOGIC: Cranial nerves II through XII are intact. No gross focal neurological deficits. Sensation intact. Reflexes intact.  SKIN: No ulceration, lesions, rashes, or cyanosis. Skin warm and dry. Turgor intact.  PSYCHIATRIC: Mood, affect blunted. The patient somnolent but easily arousable, alert and oriented x 3 when once to answer questions. Insight, judgment poor.    LABORATORY PANEL:   CBC  Recent Labs Lab 11/30/15 1518  WBC 8.0  HGB 13.2  HCT 39.9*  PLT 150    ------------------------------------------------------------------------------------------------------------------  Chemistries   Recent Labs Lab 11/30/15 1518  NA 137  K 4.2  CL 96*  CO2 29  GLUCOSE 811*  BUN 13  CREATININE 1.19  CALCIUM 9.2   ------------------------------------------------------------------------------------------------------------------  Cardiac Enzymes  Recent Labs Lab 11/30/15 1518  TROPONINI <0.03   ------------------------------------------------------------------------------------------------------------------  RADIOLOGY:  Dg Chest 2 View  11/30/2015  CLINICAL DATA:  40 year old male with acute chest pain. EXAM: CHEST  2 VIEW COMPARISON:  08/19/2015 and prior exams FINDINGS: The cardiomediastinal silhouette is unremarkable. There is no evidence of focal airspace disease, pulmonary edema, suspicious pulmonary  nodule/mass, pleural effusion, or pneumothorax. No acute bony abnormalities are identified. IMPRESSION: No active cardiopulmonary disease. Electronically Signed   By: Margarette Canada M.D.   On: 11/30/2015 16:43    EKG:   Orders placed or performed during the hospital encounter of 11/30/15  . EKG 12-Lead  . EKG 12-Lead    IMPRESSION AND PLAN:   40 year old African-American gentleman with history of type 2 diabetes insulin requiring presenting with elevated blood glucose.  1. Nonketotic hyperosmolar state type II diabetic without coma: Question waxing and waning mental status related to glucose versus general noncompliance with interview. He has been placed on insulin drip continue this current mental status improves he'll be allowed to tolerate diet continue IV fluid hydration switched to D5 half normal saline when blood glucose less than 250 if tolerating diet can stop fluids completely at that time. He is noncompliant with home doses of medications will need to reinitiate Lantus when coming off of insulin drip. Continue with Accu-Cheks every  one hour, BMP every 6 hours  2. Diabetic foot ulcer will check ESR, consult wound therapy no overt indication for antibiotics at this time 3. GERD without esophagitis PPI therapy 4. Substance abuse with positive urine drug screen for cocaine 5. Venous thromboembolism prophylactic: Lovenox   All the records are reviewed and case discussed with ED provider. Management plans discussed with the patient, family and they are in agreement.  CODE STATUS: Full  TOTAL TIME TAKING CARE OF THIS PATIENT: 40 critical care minutes.    Hower,  Karenann Cai.D on 11/30/2015 at 4:46 PM  Between 7am to 6pm - Pager - [phone]  After 6pm: House Pager: - [phone]  [facility]  Office  [phone]  CC: Primary care physician; Donnie Coffin, MD

## 2015-12-01 LAB — HEMOGLOBIN A1C: HEMOGLOBIN A1C: 15.3 % — AB (ref 4.0–6.0)

## 2015-12-01 LAB — BASIC METABOLIC PANEL
Anion gap: 7 (ref 5–15)
Anion gap: 8 (ref 5–15)
BUN: 12 mg/dL (ref 6–20)
BUN: 14 mg/dL (ref 6–20)
CHLORIDE: 105 mmol/L (ref 101–111)
CHLORIDE: 106 mmol/L (ref 101–111)
CO2: 31 mmol/L (ref 22–32)
CO2: 27 mmol/L (ref 22–32)
CREATININE: 0.98 mg/dL (ref 0.61–1.24)
Calcium: 9 mg/dL (ref 8.9–10.3)
Calcium: 9.1 mg/dL (ref 8.9–10.3)
Creatinine, Ser: 1.34 mg/dL — ABNORMAL HIGH (ref 0.61–1.24)
GFR calc Af Amer: >60 mL/min (ref >60)
GFR calc Af Amer: >60 mL/min (ref >60)
GFR calc non Af Amer: >60 mL/min (ref >60)
GFR calc non Af Amer: >60 mL/min (ref >60)
GLUCOSE: 272 mg/dL — AB (ref 65–99)
Glucose, Bld: 294 mg/dL — ABNORMAL HIGH (ref 65–99)
POTASSIUM: 4 mmol/L (ref 3.5–5.1)
Potassium: 3.5 mmol/L (ref 3.5–5.1)
SODIUM: 143 mmol/L (ref 135–145)
Sodium: 141 mmol/L (ref 135–145)

## 2015-12-01 LAB — GLUCOSE, CAPILLARY
GLUCOSE-CAPILLARY: 118 mg/dL — AB (ref 65–99)
GLUCOSE-CAPILLARY: 122 mg/dL — AB (ref 65–99)
GLUCOSE-CAPILLARY: 133 mg/dL — AB (ref 65–99)
GLUCOSE-CAPILLARY: 204 mg/dL — AB (ref 65–99)
GLUCOSE-CAPILLARY: 246 mg/dL — AB (ref 65–99)
GLUCOSE-CAPILLARY: 246 mg/dL — AB (ref 65–99)
GLUCOSE-CAPILLARY: 247 mg/dL — AB (ref 65–99)
GLUCOSE-CAPILLARY: 258 mg/dL — AB (ref 65–99)
GLUCOSE-CAPILLARY: 402 mg/dL — AB (ref 65–99)
Glucose-Capillary: 157 mg/dL — ABNORMAL HIGH (ref 65–99)
Glucose-Capillary: 178 mg/dL — ABNORMAL HIGH (ref 65–99)
Glucose-Capillary: 134 mg/dL — ABNORMAL HIGH (ref 65–99)
Glucose-Capillary: 278 mg/dL — ABNORMAL HIGH (ref 65–99)
Glucose-Capillary: 286 mg/dL — ABNORMAL HIGH (ref 65–99)
Glucose-Capillary: 395 mg/dL — ABNORMAL HIGH (ref 65–99)
Glucose-Capillary: 336 mg/dL — ABNORMAL HIGH (ref 65–99)
Glucose-Capillary: 319 mg/dL — ABNORMAL HIGH (ref 65–99)

## 2015-12-01 MED ORDER — COLLAGENASE 250 UNIT/GM EX OINT
TOPICAL_OINTMENT | Freq: Every day | CUTANEOUS | Status: DC
  Administered 2015-12-01: 1 via TOPICAL
  Administered 2015-12-02: 18:00:00 via TOPICAL
  Filled 2015-12-01: qty 30

## 2015-12-01 MED ORDER — INSULIN ASPART 100 UNIT/ML ~~LOC~~ SOLN
0.0000 [IU] | Freq: Three times a day (TID) | SUBCUTANEOUS | Status: DC
  Administered 2015-12-01: 11 [IU] via SUBCUTANEOUS
  Administered 2015-12-01: 8 [IU] via SUBCUTANEOUS
  Administered 2015-12-02: 11 [IU] via SUBCUTANEOUS
  Administered 2015-12-02: 3 [IU] via SUBCUTANEOUS
  Administered 2015-12-02: 8 [IU] via SUBCUTANEOUS
  Filled 2015-12-01: qty 8
  Filled 2015-12-01: qty 15
  Filled 2015-12-01: qty 3
  Filled 2015-12-01: qty 11
  Filled 2015-12-01: qty 8
  Filled 2015-12-01: qty 11

## 2015-12-01 MED ORDER — GLUCERNA SHAKE PO LIQD
237.0000 mL | Freq: Three times a day (TID) | ORAL | Status: DC
  Administered 2015-12-01 – 2015-12-02 (×4): 237 mL via ORAL

## 2015-12-01 MED ORDER — INSULIN REGULAR BOLUS VIA INFUSION: 0.0000 [IU] | Freq: Three times a day (TID) | INTRAVENOUS | Status: DC

## 2015-12-01 MED ORDER — SODIUM CHLORIDE 0.9 % IV BOLUS (SEPSIS): 1000.0000 mL | Freq: Once | INTRAVENOUS | Status: DC

## 2015-12-01 MED ORDER — INSULIN ASPART 100 UNIT/ML ~~LOC~~ SOLN
15.0000 [IU] | Freq: Once | SUBCUTANEOUS | Status: AC
  Administered 2015-12-01: 15 [IU] via SUBCUTANEOUS

## 2015-12-01 MED ORDER — INSULIN ASPART 100 UNIT/ML ~~LOC~~ SOLN
0.0000 [IU] | Freq: Every day | SUBCUTANEOUS | Status: DC
  Administered 2015-12-01: 4 [IU] via SUBCUTANEOUS
  Filled 2015-12-01: qty 4

## 2015-12-01 MED ORDER — INSULIN GLARGINE 100 UNIT/ML ~~LOC~~ SOLN
25.0000 [IU] | Freq: Every day | SUBCUTANEOUS | Status: DC
  Administered 2015-12-01 – 2015-12-02 (×2): 25 [IU] via SUBCUTANEOUS
  Filled 2015-12-01 (×2): qty 0.25

## 2015-12-01 MED ORDER — SODIUM CHLORIDE 0.9 % IV BOLUS (SEPSIS)
1000.0000 mL | Freq: Once | INTRAVENOUS | Status: AC
  Administered 2015-12-01: 1000 mL via INTRAVENOUS

## 2015-12-01 MED ORDER — POTASSIUM CHLORIDE IN NACL 20-0.9 MEQ/L-% IV SOLN
INTRAVENOUS | Status: DC
  Administered 2015-12-01 – 2015-12-02 (×3): via INTRAVENOUS
  Filled 2015-12-01 (×6): qty 1000

## 2015-12-01 NOTE — Progress Notes (Signed)
Initial Nutrition Assessment  DOCUMENTATION CODES:   Severe malnutrition in context of social or environmental circumstances  INTERVENTION:  Monitor intake and cater to pt preferences Recommend glucerna TID for added nutrition between meals Discussed DM meal planning briefly with pt but will need review as pt falling asleep.     NUTRITION DIAGNOSIS:   Inadequate oral intake related to social / environmental circumstances as evidenced by per patient/family report.    GOAL:   Patient will meet greater than or equal to 90% of their needs    MONITOR:   PO intake, Supplement acceptance, Labs  REASON FOR ASSESSMENT:   Malnutrition Screening Tool    ASSESSMENT:   89/ y/o male admitted with hyperglycemia, chest pain, nonketotic hyperosmolar state, right leg ulcer and positive for cocaine  Past Medical History  Diagnosis Date  . Depression   . Diabetes mellitus   . Hyperlipidemia   . HTN (hypertension)   . Chronic headaches   . Osteoarthritis   . Kidney stones   . Polysubstance abuse     cocaine, marijuana-quit summer 2010  . Tobacco abuse   . Asthma   . Chest pain   . Lumbar disc disease   . GERD (gastroesophageal reflux disease)   . OSA (obstructive sleep apnea)   . Peripheral neuropathy ([facility]) 02/01/2011  . Bipolar 1 disorder ([facility])   . Ingrown nail 11/12/2013   Noted pt found stealing glucerna from [employer] just prior to admission. Noted also pt is homeless.   Pt very drowsy this am and revisited this pm.  Still hardly able to keep eyes open but answering questions. Reports appetite has been down for days.  Ate 100% of lunch today and tolerated well.     Medications reviewed aspart, lantus, NS with KCL at 16200ml/hr, insulin drip stopped  Labs reviewed FSBS 286, 247, 246, 278, creatinine 1.34, serum glucose 272  Nutrition-Focused physical exam completed. Findings are mild/moderate-severe fat depletion, moderate to severe muscle depletion, and none edema.      Diet Order:  Diet Carb Modified Fluid consistency:: Thin; Room service appropriate?: Yes  Skin:  Reviewed, no issues  Last BM:  5/6  Height:   Ht Readings from Last 1 Encounters:  11/30/15 5\' 11"  (1.803 m)    Weight: 12% wt loss in the last 10 months per wt encounters  Wt Readings from Last 1 Encounters:  11/30/15 135 lb 9.3 oz (61.5 kg)    Ideal Body Weight:     BMI:  Body mass index is 18.92 kg/(m^2).  Estimated Nutritional Needs:   Kcal:  1860-2170 kcals/d.   Protein:  93-109 g/d  Fluid:  >/= 2L/d  EDUCATION NEEDS:   Education needs addressed  Alicyn Klann B. Freida BusmanAllen, RD, LDN [phone] (pager) Weekend/On-Call pager [phone])

## 2015-12-01 NOTE — Consult Note (Addendum)
WOC wound consult note Reason for Consult: Neuropathic ulcer to right plantar foot, near third metatarsal, full thickness  Currently receives contact casting at wound care center.  Currently exhibits lethargy and disinterest in care.  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  Both ruddy red with devitalized tissue present.  Call to wound care center.  Last visit and contact casting was 11/17/15.  Agreed with treatment plan for Santyl.    Pressure Ulcer POA: Yes  Unstageable pressure injury- Device related from contact casting to right anterior leg.  Measurement:RIght anterior 1 cm x 2 cm x 0.2 cm  Wound ZOX:WRUEAbed:ruddy red with devitalized tissue present.  Drainage (amount, consistency, odor) MInimal serosanguinous. No odor.  Periwound: Intact Dressing procedure/placement/frequency:Cleanse wounds to right anterior leg and right plantar foot with NS and pat gently dry.  Apply Santyl ointment to both wound beds and cover with NS moist gauze.  Cover with 4x4 gauze and kerlix/tape.  Change daily.  Follow up with wound care center.  Will not follow at this time.  Please re-consult if needed.  Maple HudsonKaren Neema Barreira RN BSN CWON Pager [phone]

## 2015-12-01 NOTE — Progress Notes (Signed)
Pt very sleepy.  Difficult to arouse at first.  He wakes up to follow commands and speech clear.  Just drowsy. Glucose checked and is 395. Dr Elpidio AnisSudini notified.   15 units novolog SQ ordered for now. We are to recheck at 1730 and administer sliding scale.  Dr Elpidio AnisSudini wants to go ahead with transfer to med/surg when bed available.

## 2015-12-01 NOTE — Progress Notes (Addendum)
Inpatient Diabetes Program Recommendations  AACE/ADA: New Consensus Statement on Inpatient Glycemic Control (2015)  Target Ranges:  Prepandial:   less than 140 mg/dL      Peak postprandial:   less than 180 mg/dL (1-2 hours)      Critically ill patients:  140 - 180 mg/dL  Results for MALCOMB, GANGEMI (MRN [id number]) as of 12/01/2015 07:49  Ref. Range 12/01/2015 00:07 12/01/2015 01:00 12/01/2015 02:02 12/01/2015 03:00 12/01/2015 04:15 12/01/2015 05:10 12/01/2015 06:15 12/01/2015 07:18  Glucose-Capillary Latest Ref Range: 65-99 mg/dL 034 (H) 742 (H) 595 (H) 258 (H) 246 (H) 204 (H) 122 (H) 118 (H)   Review of Glycemic Control  Diabetes history: DM2 Outpatient Diabetes medications: Tresiba 45 units QHS, Novolog 35-45 TID with meals, Metformin 1000 mg BID, Onglyza 5 mg daily Current orders for Inpatient glycemic control: Novolin R insulin drip per IV insulin/GlucoStabilizer order set  Inpatient Diabetes Program Recommendations: Insulin - Basal: When patient meets criteria to transition off IV insulin to SQ insulin, please consider ordering Lantus 25 units Q24H (based on 61.5 kg x 0.4 units). Correction (SSI): When patient meets criteria to transition off IV insulin to SQ insulin, please consider ordering Novolog 0-9 units Q4H. Meal Coverage:  When patient meets criteria to transition off IV insulin to SQ insulin, please consider ordering Novolog 4 units TID with meals for meal coverage if patient is eating at least 50% of meals. HgbA1C: Please consider ordering an A1C to evaluate glycemic control over the past 2-3 months.   NOTE: Per IV insulin/GlucoStabilizer protocol once CBGs are in target (less than 180 mg/dl) for 4 consecutive hours, RN to call MD for insulin transition orders using Glycemic Control order set including basal, correction, and meal coverage as indicated. NURSING please note basal SQ (Levemir/Lantus) insulin must be given 1-2 hours prior to discontinuation of insulin drip and Novolog SQ correction  scale must be administered simultaneously when the drip is discontinued  Addendum 12/01/15@14 :09-Spoke with patient about diabetes and home regimen for diabetes control. Patient was very drowsy and appeared to drift off to sleep frequently during conversation. When patient was reawakened each time he was engaged in conversation. Patient reports that he is followed by PCP for diabetes management and currently he takes [place] 20 units QHS, Novolog 20-35 units TID with meals, Metformin 1000 mg BID, Onglyza 5 mg daily as an outpatient for diabetes control. Patient states that he checks his glucose 1 time per day (in the evening or before bed) and his glucose fluctuates from mid 100's-300's mg/dl. Patient reports that he has an abundance of glucose testing supplies but he "just don't check it like I should." Inquired about prior A1C and patient reports that his last A1C was 12%. Patient reports that at his last PCP visit when his A1C was 12% he was started on Novolog insulin with meals. Patient reports that he has been taking the Novolog for about 2-3 weeks. Discussed glucose and A1C goals. Discussed importance of checking CBGs and maintaining good CBG control to prevent long-term and short-term complications. Explained how hyperglycemia leads to damage within blood vessels which lead to the common complications seen with uncontrolled diabetes. Stressed to the patient the importance of improving glycemic control to prevent further complications from uncontrolled diabetes. Discussed impact of nutrition, exercise, stress, sickness, and medications on diabetes control. Patient reports that his co-pays for each insulin is $100 per month which he states he can not afford. Discussed [facility] and insulin manufacture medication assistance programs. Placed  consult for Case Management to see if patient can get assistance with medications at the [facility]. Also encouraged patient to check  into insulin manufacture medication assistance programs to see if he can qualify for reduced or free insulin.  Encouraged patient to check his glucose 3-4 times per day (before meals and at bedtime) and to keep a log book of glucose readings and insulin taken which he will need to take to doctor appointments. Stressed the importance of glucose logs to help his doctor decide what DM medications need to be adjusted. Patient verbalized understanding of information discussed and he states that he has no further questions at this time related to diabetes.  Thanks, Orlando PennerMarie Dereck Agerton, RN, MSN, CDE Diabetes Coordinator Inpatient Diabetes Program [phone] (Team Pager from 8am to 5pm) [phone] ([facility] office) [phone] [facility] office) [phone] [facility] office)

## 2015-12-01 NOTE — Progress Notes (Signed)
PT Cancellation Note  Patient Details Name: Wayne [name] A Cudney MRN: [id number] DOB: 01/03/1976   Cancelled Treatment:     . Patient currently quite lethargic and difficult to arouse. His most recent BS was 395, will be receiving 15 units insulin shortly per MD/RN. Will hold off on treatment currently and evaluate patient as he is more alert and appropriate.   Kerin RansomPatrick A McNamara, PT, DPT    12/01/2015, 3:33 PM

## 2015-12-01 NOTE — Progress Notes (Signed)
Bed is available. RM 207.  Preparing for transfer.  Report called to [facility] nurse.

## 2015-12-01 NOTE — Progress Notes (Signed)
[facility] - [place] [facility]   PATIENT NAME: Wayne [name]    MR#:  [id number]  DATE OF BIRTH:  1975-08-10  SUBJECTIVE:  CHIEF COMPLAINT:   Chief Complaint  Patient presents with  . Hyperglycemia  . Chest Pain   Feels better. More awake. Alert and oriented 3. Afebrile.  REVIEW OF SYSTEMS:    Review of Systems  Constitutional: Positive for malaise/fatigue. Negative for fever and chills.  HENT: Negative for sore throat.   Eyes: Negative for blurred vision, double vision and pain.  Respiratory: Negative for cough, hemoptysis, shortness of breath and wheezing.   Cardiovascular: Negative for chest pain, palpitations, orthopnea and leg swelling.  Gastrointestinal: Positive for nausea. Negative for heartburn, vomiting, abdominal pain, diarrhea and constipation.  Genitourinary: Negative for dysuria and hematuria.  Musculoskeletal: Negative for back pain and joint pain.  Skin: Negative for rash.  Neurological: Negative for sensory change, speech change, focal weakness and headaches.  Endo/Heme/Allergies: Does not bruise/bleed easily.  Psychiatric/Behavioral: Negative for depression. The patient is not nervous/anxious.     DRUG ALLERGIES:   Allergies  Allergen Reactions  . Lipitor [Atorvastatin] Other (See Comments)    All statins. Muscle weakness.   . Nystatin Other (See Comments)    Muscle weakness  . Statins Other (See Comments)    Muscle weakness, stomach cramps  . Iodine Rash    VITALS:  Blood pressure 93/76, pulse 94, temperature 98.5 F (36.9 C), temperature source Oral, resp. rate 20, height  (1.803 m), weight 61.5 kg (135 lb 9.3 oz), SpO2 99 %.  PHYSICAL EXAMINATION:   Physical Exam  GENERAL:  40 y.o.-year-old patient lying in the bed with no acute distress.  EYES: Pupils equal, round, reactive to light and accommodation. No scleral icterus. Extraocular muscles intact.  HEENT: Head atraumatic, normocephalic. Oropharynx and  nasopharynx clear.  NECK:  Supple, no jugular venous distention. No thyroid enlargement, no tenderness.  LUNGS: Normal breath sounds bilaterally, no wheezing, rales, rhonchi. No use of accessory muscles of respiration.  CARDIOVASCULAR: S1, S2 normal. No murmurs, rubs, or gallops.  ABDOMEN: Soft, nontender, nondistended. Bowel sounds present. No organomegaly or mass.  EXTREMITIES: No cyanosis, clubbing or edema b/l.    NEUROLOGIC: Cranial nerves II through XII are intact. No focal Motor or sensory deficits b/l.   PSYCHIATRIC: The patient is alert and oriented x 3.  SKIN: right anterior leg ulcer, dry, 2 x 2 centimeters. Surrounding erythema with warmth and tenderness. No discharge.  LABORATORY PANEL:   CBC  Recent Labs Lab 11/30/15 1518  WBC 8.0  HGB 13.2  HCT 39.9*  PLT 150   ------------------------------------------------------------------------------------------------------------------ Chemistries   Recent Labs Lab 12/01/15 0314  NA 143  K 3.5  CL 105  CO2 31  GLUCOSE 294*  BUN 12  CREATININE 0.98  CALCIUM 9.0   ------------------------------------------------------------------------------------------------------------------  Cardiac Enzymes  Recent Labs Lab 11/30/15 1518  TROPONINI <0.03   ------------------------------------------------------------------------------------------------------------------  RADIOLOGY:  Dg Chest 2 View  11/30/2015  CLINICAL DATA:  40 year old male with acute chest pain. EXAM: CHEST  2 VIEW COMPARISON:  08/19/2015 and prior exams FINDINGS: The cardiomediastinal silhouette is unremarkable. There is no evidence of focal airspace disease, pulmonary edema, suspicious pulmonary nodule/mass, pleural effusion, or pneumothorax. No acute bony abnormalities are identified. IMPRESSION: No active cardiopulmonary disease. Electronically Signed   By: Harmon Pier M.D.   On: 11/30/2015 16:43   Dg Tibia/fibula Right Port  11/30/2015  CLINICAL DATA:   Anterior left lower leg wound.  No known injury. EXAM: PORTABLE RIGHT TIBIA AND FIBULA - 2 VIEW COMPARISON:  None. FINDINGS: Anterior soft tissue wound identified. There is no evidence of fracture, subluxation or dislocation. No focal bony lesion or evidence of acute osteomyelitis noted. No other soft tissue abnormalities are identified. IMPRESSION: Anterior soft tissue wound without bony abnormality. Electronically Signed   By: Harmon PierJeffrey  Hu M.D.   On: 11/30/2015 19:41     ASSESSMENT AND PLAN:   40 year old African-American gentleman with history of type 2 diabetes insulin requiring presenting with elevated blood glucose due to non compliance  1. Nonketotic hyperosmolar state type II diabetic without coma: Question waxing and waning mental status related to glucose versus general noncompliance with interview.  Blood Glucose improved. Will start on Lantus 25 units. Stop insulin drip 1 hour after Lantus. Diabetic diet. Sliding scale insulin added. Check HbA1c. Counseled to be compliant with medications.  2. Right leg ulcer with surrounding cellulitis. On IV antibiotics. Continue for 1 more day. X-ray showed no osteomyelitis.  3. GERD without esophagitis PPI therapy  4. Substance abuse with positive urine drug screen for cocaine Counseled to quit  5. DVT prophylaxis: Lovenox  All the records are reviewed and case discussed with Care Management/Social Workerr. Management plans discussed with the patient, family and they are in agreement.  CODE STATUS: FULL CODE  DVT Prophylaxis: SCDs  TOTAL TIME TAKING CARE OF THIS PATIENT: 35 minutes.   POSSIBLE D/C IN 1-2 DAYS on oral antibiotics.  Milagros LollSudini, Philamena Kramar R M.D on 12/01/2015 at 9:14 AM  Between 7am to 6pm - Pager - [phone]  After 6pm go to [email or web address] - password [email or web address]  Fabio Neighborsagle  Hospitalists  Office  [phone]  CC: Primary care physician; Lupe Carneyean Mitchell, MD  Note: This dictation was prepared with Dragon dictation  along with smaller phrase technology. Any transcriptional errors that result from this process are unintentional.

## 2015-12-01 NOTE — Care Management (Signed)
Case discussed with diabetic coordinator. Novolin 70/30 would be a better choice for patient long acting insulin if the cost is cheaper. Attempted to assess patient. He is lethargic and confused. Primary nurse checked patient BS and it was 395. Patient uses [facility] in [place] [phone]). TC to pharmacy. His [name] currently has NO COPAY (Preferred Drug). He previously paid $25 in January.  He is not eligable for the medication Management clinic with insurance. Will assist further as needed.

## 2015-12-01 NOTE — Progress Notes (Signed)
Notified [facility] about pt low blood pressure, acknowledged. received new order for 1L bolus. Pt resting in bed continue to assess.

## 2015-12-02 DIAGNOSIS — E43 Unspecified severe protein-calorie malnutrition: Secondary | ICD-10-CM | POA: Insufficient documentation

## 2015-12-02 LAB — GLUCOSE, CAPILLARY
GLUCOSE-CAPILLARY: 285 mg/dL — AB (ref 65–99)
Glucose-Capillary: 281 mg/dL — ABNORMAL HIGH (ref 65–99)
Glucose-Capillary: 318 mg/dL — ABNORMAL HIGH (ref 65–99)
Glucose-Capillary: 168 mg/dL — ABNORMAL HIGH (ref 65–99)

## 2015-12-02 LAB — BASIC METABOLIC PANEL
ANION GAP: 6 (ref 5–15)
BUN: 17 mg/dL (ref 6–20)
CALCIUM: 8.4 mg/dL — AB (ref 8.9–10.3)
CO2: 27 mmol/L (ref 22–32)
CREATININE: 1.22 mg/dL (ref 0.61–1.24)
Chloride: 108 mmol/L (ref 101–111)
GFR calc Af Amer: >60 mL/min (ref >60)
GLUCOSE: 301 mg/dL — AB (ref 65–99)
Potassium: 4 mmol/L (ref 3.5–5.1)
Sodium: 141 mmol/L (ref 135–145)

## 2015-12-02 MED ORDER — COLLAGENASE 250 UNIT/GM EX OINT: TOPICAL_OINTMENT | Freq: Every day | CUTANEOUS | Status: DC

## 2015-12-02 MED ORDER — INSULIN ASPART 100 UNIT/ML ~~LOC~~ SOLN: 8.0000 [IU] | Freq: Three times a day (TID) | SUBCUTANEOUS | Status: DC

## 2015-12-02 MED ORDER — AMOXICILLIN-POT CLAVULANATE 875-125 MG PO TABS: 1.0000 | ORAL_TABLET | Freq: Two times a day (BID) | ORAL | Status: DC

## 2015-12-02 MED ORDER — EZETIMIBE 10 MG PO TABS: 10.0000 mg | ORAL_TABLET | Freq: Every day | ORAL | Status: DC

## 2015-12-02 MED ORDER — INSULIN GLARGINE 100 UNIT/ML ~~LOC~~ SOLN: 35.0000 [IU] | Freq: Every day | SUBCUTANEOUS | Status: DC

## 2015-12-02 MED ORDER — AMOXICILLIN-POT CLAVULANATE 875-125 MG PO TABS
1.0000 | ORAL_TABLET | Freq: Two times a day (BID) | ORAL | Status: DC
  Administered 2015-12-02: 1 via ORAL
  Filled 2015-12-02: qty 1

## 2015-12-02 MED ORDER — INSULIN ASPART 100 UNIT/ML ~~LOC~~ SOLN
8.0000 [IU] | Freq: Three times a day (TID) | SUBCUTANEOUS | Status: DC
  Administered 2015-12-02 (×2): 8 [IU] via SUBCUTANEOUS
  Filled 2015-12-02 (×2): qty 8

## 2015-12-02 MED ORDER — GLUCERNA SHAKE PO LIQD: 237.0000 mL | Freq: Three times a day (TID) | ORAL | Status: DC

## 2015-12-02 MED ORDER — INSULIN GLARGINE 100 UNIT/ML ~~LOC~~ SOLN
35.0000 [IU] | Freq: Every day | SUBCUTANEOUS | Status: DC
  Filled 2015-12-02: qty 0.35

## 2015-12-02 NOTE — Progress Notes (Signed)
Patient discharged to home. IV sites removed. Discharge prescriptions given to patient. Concerns addressed. New boot provided for patient. Dressing to shin and right foot changed. Santyl ointment applied.  Patient contact for Rachael FeeDarryl (friend) [phone] And Ethelene Brownsnthony (Darryl's Brother) [phone]

## 2015-12-02 NOTE — Evaluation (Signed)
Physical Therapy Evaluation Patient Details Name: Wayne [name] MRN: [id number] DOB: 03/23/1976 Today's Date: 12/02/2015   History of Present Illness  Pt is a 40 y.o. M admitted to hospital for elevated glucose level. Pt previously was treated for foot ulcer with cast and boot, however pt developed another ulcer on shin from cast. Cast removed while at the hospital this admission. Pt has hx of depression, DM, HTN, polysubstance abuse and dbipolar disorder.   Clinical Impression  Pt is a 40 y.o. M admitted to hospital for elevated glucose. Pt also has foot and shin ulcer on R LE and is WBAT. 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 Pt did not use AD prior to admission. Pt awake and oriented during evaluation. Pt demonstrated fair B UE and good LE strength. Pt able to perform bed mobility with modified independence. Pt able to transfer from EOB using RW and mod assist. Pt able to ambulate approx 10 ft using RW and min assist. Spoke with MD and nurse about WB status; pt is WBAT with boot on. Ambulation limited by boot on R LE too large now that pt not in cast. Talked to MD, will get pt new boot in smaller size. Pt demonstrated slight LOB while ambulating backwards. Pt impulsive and tried to stand multiple times prior to PTs instruction, including trying to bear-weight on R LE w/o boot on. Pt performed supine there-ex on B LE with no assist. Pt demonstrates deficits in strength and mobility. Pt would benefit from further PT after discharge from acute hospitalization; recommend pt receive outpatient PT after discharge from acute hospitalization.     Follow Up Recommendations Outpatient PT    Equipment Recommendations  Rolling walker with 5" wheels    Recommendations for Other Services       Precautions / Restrictions Precautions Precautions: Fall Restrictions Weight Bearing Restrictions: No      Mobility  Bed Mobility Overal bed mobility: Modified Independent              General bed mobility comments: Pt able to perform bed mobility independently with use of bed rails and increased time. Pt impulsive and sat on EOB prior to PT instruction.   Transfers Overall transfer level: Needs assistance Equipment used: Rolling walker (2 wheeled) Transfers: Sit to/from Stand Sit to Stand: Mod assist         General transfer comment: Pt able to stand from EOB using RW and mod assist. Pt needed to take two attempts before standing. Pt required increased time to rise and upright posture. Pt provided cues for hand and foot placement when standing.   Ambulation/Gait Ambulation/Gait assistance: Min assist Ambulation Distance (Feet): 10 Feet Assistive device: Rolling walker (2 wheeled) Gait Pattern/deviations: Step-through pattern Gait velocity: slow   General Gait Details: Pt able to ambulate approx 10 ft using RW and min assist. Ambulation limited by pts boot not fitting properly. Pt stated it was too difficult to walk with boot. Pt demonstrated slow, step-through gait pattern. When walking backwards, pt demonstrated slight LOB. Pt impulsive and tried to stand up and walk to chair prior to PT instruction.   Stairs            Wheelchair Mobility    Modified Rankin (Stroke Patients Only)       Balance Overall balance assessment: Needs assistance Sitting-balance support: Feet supported Sitting balance-Leahy Scale: Good Sitting balance - Comments: Pt demonstrated good sitting balance with both LE supported.  Standing balance support: Bilateral upper extremity supported Standing balance-Leahy Scale: Fair Standing balance comment: Pt able to maintain standing balance with RW and CGA for safety. Pt demonstrated slight LOB, possibly d/t boot, when ambulating backwards.                              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 Per chart, he has a brother in [place] he lives with sometimes.     Prior Function Level of Independence: Independent         Comments: Pt indepdent with all ADLs. Does not use AD for ambulation.     Hand Dominance        Extremity/Trunk Assessment   Upper Extremity Assessment: RUE deficits/detail;LUE deficits/detail RUE Deficits / Details: R UE grossly 4-/5 strength     LUE Deficits / Details: L UE grossly 4-/5 strength   Lower Extremity Assessment: RLE deficits/detail;LLE deficits/detail RLE Deficits / Details: R LE grossly 4/5 strength LLE Deficits / Details: L LE grossly 4+/5 strength     Communication   Communication: No difficulties  Cognition Arousal/Alertness: Awake/alert Behavior During Therapy: Impulsive Overall Cognitive Status: Within Functional Limits for tasks assessed                      General Comments      Exercises Other Exercises Other Exercises: Pt performed supine ther-ex on B LE including quad sets, SLR, hip ab/ad and SAQ with no assist. All ther-ex performed x10 reps.       Assessment/Plan    PT Assessment Patient needs continued PT services  PT Diagnosis Difficulty walking;Abnormality of gait;Generalized weakness   PT Problem List Decreased strength;Decreased mobility;Decreased balance;Decreased knowledge of use of DME  PT Treatment Interventions DME instruction;Gait training;Therapeutic activities;Balance training;Therapeutic exercise   PT Goals (Current goals can be found in the Care Plan section) Acute Rehab PT Goals Patient Stated Goal: to return to PLOF PT Goal Formulation: With patient Time For Goal Achievement:  12/16/15 Potential to Achieve Goals: Good    Frequency Min 2X/week   Barriers to discharge        Co-evaluation               End of Session Equipment Utilized During Treatment: Gait belt;Other (comment) (boot on R LE) Activity Tolerance: Patient tolerated treatment well;Other (comment) (limited by improper fit of boot on R LE.) Patient left: in chair;with call bell/phone within reach;with chair alarm set Nurse Communication: Mobility status         Time: 6962-9528 PT Time Calculation (min) (ACUTE ONLY): 28 min   Charges:         PT G Codes:        Dorita Fray 2015-12-26, 11:14 AM M. Hettie Holstein, SPT

## 2015-12-02 NOTE — Clinical Social Work Note (Signed)
CSW consulted for homelessness and no transportation. Patient has told nursing that he has a ride today and will be going to [place]. Patient requested resources for drug rehab. CSW went in to give patient resources for drug rehab and patient was sleeping and would not awaken when CSW called his name several times. Resources left for patient at bedside. York SpanielMonica Arvis Zwahlen MSW,LCSW [phone]

## 2015-12-02 NOTE — Progress Notes (Signed)
Pt received magistrate papers, prescriptions, and went over discharge instructions. Pt stated he understood discharge and follow-up instructions. Pt walked out with friend Ethelene Brownsanthony.   Karsten RoLauren E Hobbs

## 2015-12-02 NOTE — Progress Notes (Signed)
Called [employer] to notify that patient needs a medium sized boot. Boot should arrive this afternoon.

## 2015-12-02 NOTE — Clinical Documentation Improvement (Signed)
Internal Medicine  Can the diagnosis of Malnutrition be further specified?   Document Severity - Severe(third degree), Moderate (second degree), Mild (first degree)  Other condition  Unable to clinically determine  Associated diagnoses/conditions: hyperglycemia, nonketotic hyperosmolar state, right leg ulcer, homeless  Supporting Information:       12/01/2015 Nutritional Management  Severe malnutrition in context of social or environmental circumstances physical exam completed. Findings are mild/moderate-severe fat depletion, moderate to severe muscle depletion, and none edema.  Body mass index is 18.92    Please exercise your independent, professional judgment when responding. A specific answer is not anticipated or expected. Please update your documentation within the medical record to reflect your response to this query. Thank you  Thank Barrie DunkerYou, Daiana Vitiello C [place] Mckercher Health Information Management [place] [phone]

## 2015-12-02 NOTE — Discharge Instructions (Signed)
Check your blood sugars three times a day F/u wound clinic for your leg ulcer

## 2015-12-02 NOTE — Progress Notes (Addendum)
Inpatient Diabetes Program Recommendations  AACE/ADA: New Consensus Statement on Inpatient Glycemic Control (2015)  Target Ranges:  Prepandial:   less than 140 mg/dL      Peak postprandial:   less than 180 mg/dL (1-2 hours)      Critically ill patients:  140 - 180 mg/dL  Results for Wayne CheekFOX, Trigg A (MRN [id number]) as of 12/02/2015 09:02  Ref. Range 12/01/2015 06:15 12/01/2015 07:18 12/01/2015 08:17 12/01/2015 09:18 12/01/2015 10:16 12/01/2015 11:14 12/01/2015 12:09 12/01/2015 15:19 12/01/2015 17:23 12/01/2015 21:10 12/02/2015 07:22  Glucose-Capillary Latest Ref Range: 65-99 mg/dL 086122 (H) 578118 (H) 469134 (H) 278 (H) 246 (H) 247 (H) 286 (H) 395 (H) 336 (H) 319 (H) 285 (H)   Review of Glycemic Control  Outpatient Diabetes medications: Tresiba 20 units QHS, Novolog 20-35 units TID with meals, Metformin 1000 mg BID, Onglyza 5 mg daily  Current orders for Inpatient glycemic control: Lantus 25 units daily, Novolog 0-15 units TID with meals, Novolog 0-5 units QHS  Inpatient Diabetes Program Recommendations: Insulin - Basal: Please consider increasing Lantus to 35 units daily (starting now). Insulin - Meal Coverage: Please order Novolog 8 units TID with meals for meal coverage. HgbA1C: A1C 15.3% on 12/01/15 indicating an average glucose of 392 mg/dl.  Addendum 12/02/15@11 :56-Spoke with patient again regarding importance of diabetes control. Patient is much more alert today and more engaged in conversation.  Discussed A1C results (15.3% on 12/01/15) and explained that his current A1C indicates an average glucose of 392 mg/dl over the past 2-3 months. Discussed glucose and A1C goals. Discussed importance of checking CBGs and maintaining good CBG control to prevent long-term and short-term complications. Explained how hyperglycemia leads to damage within blood vessels which lead to the common complications seen with uncontrolled diabetes. Stressed to the patient the importance of improving glycemic control to prevent further complications from  uncontrolled diabetes. Again encouraged patient to check his glucose 4 times per day (before meals and at bedtime) and to keep a log book of glucose readings and insulin taken which he will need to take to doctor appointments. Explained how the doctor he follows up with can use the log book to continue to make insulin adjustments if needed. Reminded patient that Case Management has verifed that his [name] insulin has NO COPAY and stressed that the needs to be taking his DM medications as prescribed. Patient verbalized understanding of information discussed and he states that he has no further questions at this time related to diabetes.  Thanks, Orlando PennerMarie Lenya Sterne, RN, MSN, CDE Diabetes Coordinator Inpatient Diabetes Program [phone] (Team Pager from 8am to 5pm) [phone] ([facility] office) [phone] [facility] office) [phone] [facility] office)

## 2015-12-02 NOTE — Discharge Summary (Signed)
[facility] - [place] at [facility]   PATIENT NAME: Wayne [name]    MR#:  [id number]  DATE OF BIRTH:  [birth year]  DATE OF ADMISSION:  11/30/2015 ADMITTING PHYSICIAN: Wyatt Haste, MD  DATE OF DISCHARGE: 12/02/15  PRIMARY CARE PHYSICIAN: Lupe Carney, MD   ADMISSION DIAGNOSIS:  Diabetic ketoacidosis without coma associated with type 1 diabetes mellitus (HCC) [E10.10]  DISCHARGE DIAGNOSIS:  HONK in Type 2 DM due to non compliance Cocaine abuse Depression Hypertension  SECONDARY DIAGNOSIS:   Past Medical History  Diagnosis Date  . Depression   . Diabetes mellitus   . Hyperlipidemia   . HTN (hypertension)   . Chronic headaches   . Osteoarthritis   . Kidney stones   . Polysubstance abuse     cocaine, marijuana-quit summer 2010  . Tobacco abuse   . Asthma   . Chest pain   . Lumbar disc disease   . GERD (gastroesophageal reflux disease)   . OSA (obstructive sleep apnea)   . Peripheral neuropathy (HCC) 02/01/2011  . Bipolar 1 disorder (HCC)   . Ingrown nail 11/12/2013    HOSPITAL COURSE:  Wayne [name] is a 40 y.o. male with a known history of Type 2 diabetes insulin requiring, medical noncompliance, substance abuse who is presenting with elevated blood glucose. The patient is a known type II diabetic noncompliant with insulin who was originally picked up by police earlier today for attending to shoplift Glucerna at [facility]  1. Nonketotic hyperosmolar state type II diabetic without coma:  -Question waxing and waning mental status related to glucose versus general noncompliance with interview.  Blood Glucose improved. lantus 35 unit q day and novolog 8 units tid Diabetic diet. Sliding scale insulin added. Counseled to be compliant with medications.  2. Right leg ulcer with surrounding cellulitis. On IV antibiotics. Continue for 1 more day. X-ray showed no osteomyelitis. Changed to by mouth Augmentin patient will follow up with wound care as before in  [place]  3. GERD without esophagitis PPI therapy  4. Substance abuse with positive urine drug screen for cocaine Counseled to quit  5. DVT prophylaxis: Lovenox  We'll resume patient's medications at home recommended him to be compliant and follow up with his primary care physician [facility]. CONSULTS OBTAINED:  Treatment Team:  Wyatt Haste, MD  DRUG ALLERGIES:   Allergies  Allergen Reactions  . Lipitor [Atorvastatin] Other (See Comments)    All statins. Muscle weakness.   . Nystatin Other (See Comments)    Muscle weakness  . Statins Other (See Comments)    Muscle weakness, stomach cramps  . Iodine Rash    DISCHARGE MEDICATIONS:   Current Discharge Medication List    START taking these medications   Details  amoxicillin-clavulanate (AUGMENTIN) 875-125 MG tablet Take 1 tablet by mouth every 12 (twelve) hours. Qty: 12 tablet, Refills: 0    collagenase (SANTYL) ointment Apply topically daily. Qty: 15 g, Refills: 0    feeding supplement, GLUCERNA SHAKE, (GLUCERNA SHAKE) LIQD Take 237 mLs by mouth 3 (three) times daily between meals. Qty: 60 Can, Refills: 0    insulin glargine (LANTUS) 100 UNIT/ML injection Inject 0.35 mLs (35 Units total) into the skin daily. Qty: 10 mL, Refills: 11      CONTINUE these medications which have CHANGED   Details  !! ezetimibe (ZETIA) 10 MG tablet Take 1 tablet (10 mg total) by mouth daily. Qty: 30 tablet, Refills: 2    insulin aspart (NOVOLOG) 100 UNIT/ML injection Inject  8 Units into the skin 3 (three) times daily with meals. Qty: 10 mL, Refills: 11     !! - Potential duplicate medications found. Please discuss with provider.    CONTINUE these medications which have NOT CHANGED   Details  albuterol (PROVENTIL HFA;VENTOLIN HFA) 108 (90 Base) MCG/ACT inhaler Inhale 2 puffs into the lungs every 6 (six) hours as needed for wheezing or shortness of breath.    aspirin EC 81 MG tablet Take 81 mg by mouth daily.     budesonide-formoterol (SYMBICORT) 80-4.5 MCG/ACT inhaler Inhale 2 puffs into the lungs 2 (two) times daily.    buPROPion (WELLBUTRIN XL) 300 MG 24 hr tablet Take 300 mg by mouth daily.    cholecalciferol (VITAMIN D) 1000 units tablet Take 1,000 Units by mouth daily.    !! ezetimibe (ZETIA) 10 MG tablet Take 10 mg by mouth daily.    fluticasone (FLONASE) 50 MCG/ACT nasal spray Place 2 sprays into both nostrils daily as needed for allergies or rhinitis.    gabapentin (NEURONTIN) 300 MG capsule Take 900 mg by mouth.    glucosamine-chondroitin 500-400 MG tablet Take 1 tablet by mouth daily.    loratadine (CLARITIN) 10 MG tablet Take 10 mg by mouth daily as needed for allergies.    losartan (COZAAR) 25 MG tablet Take 25 mg by mouth daily.    meloxicam (MOBIC) 15 MG tablet Take 15 mg by mouth daily.    metFORMIN (GLUCOPHAGE) 1000 MG tablet Take 1,000 mg by mouth 2 (two) times daily with a meal.    methocarbamol (ROBAXIN) 500 MG tablet Take 500 mg by mouth every 8 (eight) hours as needed for muscle spasms.    omeprazole (PRILOSEC) 40 MG capsule Take 40 mg by mouth daily.    saxagliptin HCl (ONGLYZA) 2.5 MG TABS tablet Take 2.5 mg by mouth daily.    sertraline (ZOLOFT) 100 MG tablet Take 100 mg by mouth at bedtime.    solifenacin (VESICARE) 5 MG tablet Take 5 mg by mouth daily.    traMADol (ULTRAM) 50 MG tablet Take by mouth every 6 (six) hours as needed (pain).    vitamin B-12 (CYANOCOBALAMIN) 1000 MCG tablet Take 1,000 mcg by mouth daily.    zolpidem (AMBIEN) 5 MG tablet Take 5 mg by mouth at bedtime as needed for sleep.     !! - Potential duplicate medications found. Please discuss with provider.    STOP taking these medications     ARIPiprazole (ABILIFY) 10 MG tablet      Insulin Degludec 100 UNIT/ML SOPN      tobramycin (TOBREX) 0.3 % ophthalmic solution         If you experience worsening of your admission symptoms, develop shortness of breath, life threatening  emergency, suicidal or homicidal thoughts you must seek medical attention immediately by calling 911 or calling your MD immediately  if symptoms less severe.  You Must read complete instructions/literature along with all the possible adverse reactions/side effects for all the Medicines you take and that have been prescribed to you. Take any new Medicines after you have completely understood and accept all the possible adverse reactions/side effects.   Please note  You were cared for by a hospitalist during your hospital stay. If you have any questions about your discharge medications or the care you received while you were in the hospital after you are discharged, you can call the unit and asked to speak with the hospitalist on call if the hospitalist that took care  of you is not available. Once you are discharged, your primary care physician will handle any further medical issues. Please note that NO REFILLS for any discharge medications will be authorized once you are discharged, as it is imperative that you return to your primary care physician (or establish a relationship with a primary care physician if you do not have one) for your aftercare needs so that they can reassess your need for medications and monitor your lab values. Today   SUBJECTIVE  No complaints   VITAL SIGNS:  Blood pressure 146/106, pulse 96, temperature 98.2 F (36.8 C), temperature source Oral, resp. rate 16, height  (1.803 m), weight 61.5 kg (135 lb 9.3 oz), SpO2 100 %.  I/O:    Intake/Output Summary (Last 24 hours) at 12/02/15 1256 Last data filed at 12/02/15 1012  Gross per 24 hour  Intake 4542.33 ml  Output   1550 ml  Net 2992.33 ml    PHYSICAL EXAMINATION:  GENERAL:  40 y.o.-year-old patient lying in the bed with no acute distress.  EYES: Pupils equal, round, reactive to light and accommodation. No scleral icterus. Extraocular muscles intact.  HEENT: Head atraumatic, normocephalic. Oropharynx and  nasopharynx clear.  NECK:  Supple, no jugular venous distention. No thyroid enlargement, no tenderness.  LUNGS: Normal breath sounds bilaterally, no wheezing, rales,rhonchi or crepitation. No use of accessory muscles of respiration.  CARDIOVASCULAR: S1, S2 normal. No murmurs, rubs, or gallops.  ABDOMEN: Soft, non-tender, non-distended. Bowel sounds present. No organomegaly or mass.  EXTREMITIES: No pedal edema, cyanosis, or clubbing. Chronic right leg ulcer. No evidence of cellulitis some soft tissue swelling NEUROLOGIC: Cranial nerves II through XII are intact. Muscle strength 5/5 in all extremities. Sensation intact. Gait not checked.  PSYCHIATRIC: The patient is alert and oriented x 3.  SKIN: No obvious rash, lesion, or ulcer.   DATA REVIEW:   CBC   Recent Labs Lab 11/30/15 1518  WBC 8.0  HGB 13.2  HCT 39.9*  PLT 150    Chemistries   Recent Labs Lab 12/02/15 0427  NA 141  K 4.0  CL 108  CO2 27  GLUCOSE 301*  BUN 17  CREATININE 1.22  CALCIUM 8.4*    Microbiology Results   Recent Results (from the past 240 hour(s))  CULTURE, BLOOD (ROUTINE X 2) w Reflex to PCR ID Panel     Status: None (Preliminary result)   Collection Time: 11/30/15  6:39 PM  Result Value Ref Range Status   Specimen Description BLOOD LEFT HAND  Final   Special Requests BOTTLES DRAWN AEROBIC AND ANAEROBIC  3CC  Final   Culture NO GROWTH 2 DAYS  Final   Report Status PENDING  Incomplete  CULTURE, BLOOD (ROUTINE X 2) w Reflex to PCR ID Panel     Status: None (Preliminary result)   Collection Time: 11/30/15  6:39 PM  Result Value Ref Range Status   Specimen Description BLOOD RIGHT HAND  Final   Special Requests BOTTLES DRAWN AEROBIC AND ANAEROBIC  3CC  Final   Culture NO GROWTH 2 DAYS  Final   Report Status PENDING  Incomplete  MRSA PCR Screening     Status: None   Collection Time: 11/30/15  7:02 PM  Result Value Ref Range Status   MRSA by PCR NEGATIVE NEGATIVE Final    Comment:        The  GeneXpert MRSA Assay (FDA approved for NASAL specimens only), is one component of a comprehensive MRSA colonization surveillance program. It  is not intended to diagnose MRSA infection nor to guide or monitor treatment for MRSA infections.     RADIOLOGY:  Dg Chest 2 View  11/30/2015  CLINICAL DATA:  40 year old male with acute chest pain. EXAM: CHEST  2 VIEW COMPARISON:  08/19/2015 and prior exams FINDINGS: The cardiomediastinal silhouette is unremarkable. There is no evidence of focal airspace disease, pulmonary edema, suspicious pulmonary nodule/mass, pleural effusion, or pneumothorax. No acute bony abnormalities are identified. IMPRESSION: No active cardiopulmonary disease. Electronically Signed   By: Harmon Pier M.D.   On: 11/30/2015 16:43   Dg Tibia/fibula Right Port  11/30/2015  CLINICAL DATA:  Anterior left lower leg wound.  No known injury. EXAM: PORTABLE RIGHT TIBIA AND FIBULA - 2 VIEW COMPARISON:  None. FINDINGS: Anterior soft tissue wound identified. There is no evidence of fracture, subluxation or dislocation. No focal bony lesion or evidence of acute osteomyelitis noted. No other soft tissue abnormalities are identified. IMPRESSION: Anterior soft tissue wound without bony abnormality. Electronically Signed   By: Harmon Pier M.D.   On: 11/30/2015 19:41     Management plans discussed with the patient, family and they are in agreement.  CODE STATUS:     Code Status Orders        Start     Ordered   11/30/15 1633  Full code   Continuous     11/30/15 1633    Code Status History    Date Active Date Inactive Code Status Order ID Comments User Context   08/16/2015  1:18 PM 08/19/2015  7:33 PM Full Code [id number]  Mercedes Camprubi-Soms, PA-C ED   04/05/2015 11:31 PM 04/07/2015  9:58 PM Full Code [id number]  Margarita Grizzle, MD ED   04/05/2015  8:54 PM 04/05/2015 11:31 PM Full Code [id number]  Earley Favor, NP ED   08/05/2014  6:43 PM 08/09/2014  6:24 PM Full Code [id number]  Earney Navy, NP Inpatient   08/04/2014 10:27 PM 08/05/2014  6:43 PM Full Code [id number]  Junius Finner, PA-C ED   10/03/2011  4:09 PM 10/04/2011  3:41 PM Full Code [id number]  Nat Christen, MD ED      TOTAL TIME TAKING CARE OF THIS PATIENT: 40 minutes.    Mersedes Alber M.D on 12/02/2015 at 12:56 PM  Between 7am to 6pm - Pager - [phone] After 6pm go to [email or web address] - password [email or web address] [facility]  [facility]  Office  [phone]  CC: Primary care physician; Lupe Carney, MD

## 2015-12-02 NOTE — Care Management (Signed)
RW ordered.  Barbara CowerJason with [facility] notified.  Delivered to room.

## 2015-12-03 LAB — BLOOD GAS, VENOUS
Acid-Base Excess: 2.5 mmol/L (ref 0.0–3.0)
BICARBONATE: 31.2 meq/L — AB (ref 21.0–28.0)
PATIENT TEMPERATURE: 37
PCO2 VEN: 62 mmHg — AB (ref 44.0–60.0)
PH VEN: 7.31 — AB (ref 7.320–7.430)

## 2015-12-05 LAB — CULTURE, BLOOD (ROUTINE X 2)
CULTURE: NO GROWTH
Culture: NO GROWTH

## 2015-12-08 ENCOUNTER — Encounter (HOSPITAL_COMMUNITY): Payer: Self-pay

## 2015-12-08 ENCOUNTER — Emergency Department (HOSPITAL_COMMUNITY): Payer: BLUE CROSS/BLUE SHIELD

## 2015-12-08 ENCOUNTER — Emergency Department (HOSPITAL_COMMUNITY)
Admission: EM | Admit: 2015-12-08 | Discharge: 2015-12-09 | Disposition: A | Discharge status: Home / Self Care | Payer: BLUE CROSS/BLUE SHIELD | Attending: Emergency Medicine | Admitting: Emergency Medicine

## 2015-12-08 DIAGNOSIS — J45909 Unspecified asthma, uncomplicated: Secondary | ICD-10-CM | POA: Insufficient documentation

## 2015-12-08 DIAGNOSIS — Z7952 Long term (current) use of systemic steroids: Secondary | ICD-10-CM | POA: Insufficient documentation

## 2015-12-08 DIAGNOSIS — Z792 Long term (current) use of antibiotics: Secondary | ICD-10-CM | POA: Diagnosis not present

## 2015-12-08 DIAGNOSIS — Z794 Long term (current) use of insulin: Secondary | ICD-10-CM | POA: Diagnosis not present

## 2015-12-08 DIAGNOSIS — E1140 Type 2 diabetes mellitus with diabetic neuropathy, unspecified: Secondary | ICD-10-CM | POA: Diagnosis not present

## 2015-12-08 DIAGNOSIS — I10 Essential (primary) hypertension: Secondary | ICD-10-CM | POA: Diagnosis not present

## 2015-12-08 DIAGNOSIS — M1990 Unspecified osteoarthritis, unspecified site: Secondary | ICD-10-CM | POA: Diagnosis not present

## 2015-12-08 DIAGNOSIS — Z7951 Long term (current) use of inhaled steroids: Secondary | ICD-10-CM | POA: Insufficient documentation

## 2015-12-08 DIAGNOSIS — Z79891 Long term (current) use of opiate analgesic: Secondary | ICD-10-CM | POA: Diagnosis not present

## 2015-12-08 DIAGNOSIS — G4733 Obstructive sleep apnea (adult) (pediatric): Secondary | ICD-10-CM | POA: Diagnosis not present

## 2015-12-08 DIAGNOSIS — L97519 Non-pressure chronic ulcer of other part of right foot with unspecified severity: Secondary | ICD-10-CM | POA: Diagnosis not present

## 2015-12-08 DIAGNOSIS — F329 Major depressive disorder, single episode, unspecified: Secondary | ICD-10-CM | POA: Insufficient documentation

## 2015-12-08 DIAGNOSIS — K219 Gastro-esophageal reflux disease without esophagitis: Secondary | ICD-10-CM | POA: Insufficient documentation

## 2015-12-08 DIAGNOSIS — E785 Hyperlipidemia, unspecified: Secondary | ICD-10-CM | POA: Diagnosis not present

## 2015-12-08 DIAGNOSIS — L03115 Cellulitis of right lower limb: Secondary | ICD-10-CM | POA: Diagnosis not present

## 2015-12-08 DIAGNOSIS — Z7982 Long term (current) use of aspirin: Secondary | ICD-10-CM | POA: Insufficient documentation

## 2015-12-08 DIAGNOSIS — E1165 Type 2 diabetes mellitus with hyperglycemia: Secondary | ICD-10-CM | POA: Diagnosis present

## 2015-12-08 DIAGNOSIS — Z791 Long term (current) use of non-steroidal anti-inflammatories (NSAID): Secondary | ICD-10-CM | POA: Insufficient documentation

## 2015-12-08 DIAGNOSIS — F17210 Nicotine dependence, cigarettes, uncomplicated: Secondary | ICD-10-CM | POA: Diagnosis not present

## 2015-12-08 DIAGNOSIS — Z79899 Other long term (current) drug therapy: Secondary | ICD-10-CM | POA: Diagnosis not present

## 2015-12-08 DIAGNOSIS — R739 Hyperglycemia, unspecified: Secondary | ICD-10-CM

## 2015-12-08 DIAGNOSIS — Z7984 Long term (current) use of oral hypoglycemic drugs: Secondary | ICD-10-CM | POA: Diagnosis not present

## 2015-12-08 LAB — CBC
HEMATOCRIT: 32.1 % — AB (ref 39.0–52.0)
HEMOGLOBIN: 10.9 g/dL — AB (ref 13.0–17.0)
MCH: 30.3 pg (ref 26.0–34.0)
MCHC: 34 g/dL (ref 30.0–36.0)
MCV: 89.2 fL (ref 78.0–100.0)
Platelets: 244 10*3/uL (ref 150–400)
RBC: 3.6 MIL/uL — AB (ref 4.22–5.81)
RDW: 12.6 % (ref 11.5–15.5)
WBC: 6.2 10*3/uL (ref 4.0–10.5)

## 2015-12-08 LAB — BASIC METABOLIC PANEL
ANION GAP: 7 (ref 5–15)
BUN: 11 mg/dL (ref 6–20)
CALCIUM: 9.1 mg/dL (ref 8.9–10.3)
CO2: 30 mmol/L (ref 22–32)
Chloride: 100 mmol/L — ABNORMAL LOW (ref 101–111)
Creatinine, Ser: 1.01 mg/dL (ref 0.61–1.24)
Glucose, Bld: 511 mg/dL — ABNORMAL HIGH (ref 65–99)
POTASSIUM: 3.9 mmol/L (ref 3.5–5.1)
SODIUM: 137 mmol/L (ref 135–145)

## 2015-12-08 LAB — CBG MONITORING, ED
GLUCOSE-CAPILLARY: 498 mg/dL — AB (ref 65–99)
GLUCOSE-CAPILLARY: 531 mg/dL — AB (ref 65–99)

## 2015-12-08 LAB — I-STAT CG4 LACTIC ACID, ED: LACTIC ACID, VENOUS: 0.86 mmol/L (ref 0.5–2.0)

## 2015-12-08 MED ORDER — SODIUM CHLORIDE 0.9 % IV BOLUS (SEPSIS)
1000.0000 mL | Freq: Once | INTRAVENOUS | Status: AC
  Administered 2015-12-08: 1000 mL via INTRAVENOUS

## 2015-12-08 NOTE — ED Notes (Signed)
Pt provided with urinal. Asked to call when sample is ready.

## 2015-12-08 NOTE — ED Provider Notes (Signed)
Plains of painful wound to right shin for the past 1.5 weeks he also reports he had an ulcer at the plantar aspect of his right foot for the past several weeks for which she's been followed in the wound center. He presents today as ulcer on his shin became more painful today. Is worse with weightbearing and he feels that his Cam Walker rubs against the shin. Patient reports to noncompliance with metformin for the past 2 weeks because he was not at home however he now has access to his metformin.  [name], MD 12/09/15 [phone]

## 2015-12-08 NOTE — ED Notes (Signed)
Patient transported to X-ray 

## 2015-12-08 NOTE — ED Provider Notes (Signed)
CSN: [id number]     Arrival date & time 12/08/15  2111 History   First MD Initiated Contact with Patient 12/08/15 2213     Chief Complaint  Patient presents with  . Hyperglycemia  . Diabetic Ulcer    (Consider location/radiation/quality/duration/timing/severity/associated sxs/prior Treatment) HPI Comments: Patient with history of insulin-dependent diabetes presents with complaint of high blood sugar as well as new wound over the past one and half weeks to his left anterior shin. This area has been draining somewhat. Patient is uncertain if he has had a fever. Patient states that he previously had a "boot" on his right leg due to chronic right foot wound for which he was seeing wound care for months. The boot was removed a week ago. No N/V/D. No abd pain or vomiting. The onset of this condition was acute. The course is constant. Aggravating factors: none. Alleviating factors: none.    Patient is a 40 y.o. male presenting with hyperglycemia. The history is provided by the patient.  Hyperglycemia Associated symptoms: no abdominal pain, no chest pain, no dysuria, no fever, no nausea and no vomiting     Past Medical History  Diagnosis Date  . Depression   . Diabetes mellitus   . Hyperlipidemia   . HTN (hypertension)   . Chronic headaches   . Osteoarthritis   . Kidney stones   . Polysubstance abuse     cocaine, marijuana-quit summer 2010  . Tobacco abuse   . Asthma   . Chest pain   . Lumbar disc disease   . GERD (gastroesophageal reflux disease)   . OSA (obstructive sleep apnea)   . Peripheral neuropathy (HCC) 02/01/2011  . Bipolar 1 disorder (HCC)   . Ingrown nail 11/12/2013   Past Surgical History  Procedure Laterality Date  . Knee surgery    . Bunionectomy  06-2009   Family History  Problem Relation Age of Onset  . Heart disease Mother   . Diabetes Father   . Hyperlipidemia Father   . Emphysema Maternal Uncle   . Asthma Sister   . Asthma Mother   . Stroke Mother   .  Hypertension Father   . Alzheimer's disease Maternal Grandmother   . Depression Father   . Breast cancer Maternal Aunt   . COPD Mother     maternal un   Social History  Substance Use Topics  . Smoking status: Current Some Day Smoker -- 0.20 packs/day for 17 years    Types: Cigarettes    Last Attempt to Quit: 02/24/2011  . Smokeless tobacco: Former NeurosurgeonUser  . Alcohol Use: No     Comment: socially    Review of Systems  Constitutional: Negative for fever.  HENT: Negative for rhinorrhea and sore throat.   Eyes: Negative for redness.  Respiratory: Negative for cough.   Cardiovascular: Positive for leg swelling (L ankle and foot). Negative for chest pain.  Gastrointestinal: Negative for nausea, vomiting, abdominal pain and diarrhea.  Genitourinary: Negative for dysuria.  Musculoskeletal: Negative for myalgias.  Skin: Positive for wound. Negative for rash.  Neurological: Negative for headaches.    Allergies  Lipitor; Nystatin; Statins; and Iodine  Home Medications   Prior to Admission medications   Medication Sig Start Date End Date Taking? Authorizing Provider  albuterol (PROVENTIL HFA;VENTOLIN HFA) 108 (90 Base) MCG/ACT inhaler Inhale 2 puffs into the lungs every 6 (six) hours as needed for wheezing or shortness of breath.    Historical Provider, MD  amoxicillin-clavulanate (AUGMENTIN) 875-125 MG tablet Take  1 tablet by mouth every 12 (twelve) hours. Patient not taking: Reported on 12/08/2015 12/02/15   Enedina Finner, MD  aspirin EC 81 MG tablet Take 81 mg by mouth daily.    Historical Provider, MD  budesonide-formoterol (SYMBICORT) 80-4.5 MCG/ACT inhaler Inhale 2 puffs into the lungs 2 (two) times daily.    Historical Provider, MD  buPROPion (WELLBUTRIN XL) 300 MG 24 hr tablet Take 300 mg by mouth daily. Reported on 12/08/2015    Historical Provider, MD  cholecalciferol (VITAMIN D) 1000 units tablet Take 1,000 Units by mouth daily.    Historical Provider, MD  collagenase (SANTYL)  ointment Apply topically daily. Patient not taking: Reported on 12/08/2015 12/02/15   Enedina Finner, MD  ezetimibe (ZETIA) 10 MG tablet Take 10 mg by mouth daily.    Historical Provider, MD  ezetimibe (ZETIA) 10 MG tablet Take 1 tablet (10 mg total) by mouth daily. Patient not taking: Reported on 12/08/2015 12/02/15   Enedina Finner, MD  feeding supplement, GLUCERNA SHAKE, (GLUCERNA SHAKE) LIQD Take 237 mLs by mouth 3 (three) times daily between meals. Patient not taking: Reported on 12/08/2015 12/02/15   Enedina Finner, MD  fluticasone [employer]) 50 MCG/ACT nasal spray Place 2 sprays into both nostrils daily as needed for allergies or rhinitis.    Historical Provider, MD  gabapentin (NEURONTIN) 300 MG capsule Take 900 mg by mouth.    Historical Provider, MD  glucosamine-chondroitin 500-400 MG tablet Take 1 tablet by mouth daily.    Historical Provider, MD  insulin aspart (NOVOLOG) 100 UNIT/ML injection Inject 8 Units into the skin 3 (three) times daily with meals. Patient not taking: Reported on 12/08/2015 12/02/15   Enedina Finner, MD  insulin glargine (LANTUS) 100 UNIT/ML injection Inject 0.35 mLs (35 Units total) into the skin daily. Patient not taking: Reported on 12/08/2015 12/03/15   Enedina Finner, MD  loratadine (CLARITIN) 10 MG tablet Take 10 mg by mouth daily as needed for allergies.    Historical Provider, MD  losartan (COZAAR) 25 MG tablet Take 25 mg by mouth daily.    Historical Provider, MD  meloxicam (MOBIC) 15 MG tablet Take 15 mg by mouth daily.    Historical Provider, MD  metFORMIN (GLUCOPHAGE) 1000 MG tablet Take 1,000 mg by mouth 2 (two) times daily with a meal.    Historical Provider, MD  methocarbamol (ROBAXIN) 500 MG tablet Take 500 mg by mouth every 8 (eight) hours as needed for muscle spasms.    Historical Provider, MD  omeprazole (PRILOSEC) 40 MG capsule Take 40 mg by mouth daily.    Historical Provider, MD  saxagliptin HCl (ONGLYZA) 2.5 MG TABS tablet Take 2.5 mg by mouth daily.    Historical Provider,  MD  sertraline (ZOLOFT) 100 MG tablet Take 100 mg by mouth at bedtime.    Historical Provider, MD  solifenacin (VESICARE) 5 MG tablet Take 5 mg by mouth daily.    Historical Provider, MD  traMADol (ULTRAM) 50 MG tablet Take by mouth every 6 (six) hours as needed (pain).    Historical Provider, MD  vitamin B-12 (CYANOCOBALAMIN) 1000 MCG tablet Take 1,000 mcg by mouth daily. Reported on 12/08/2015    Historical Provider, MD  zolpidem (AMBIEN) 5 MG tablet Take 5 mg by mouth at bedtime as needed for sleep. Reported on 12/08/2015    Historical Provider, MD   BP 129/97 mmHg  Pulse 110  Temp(Src) 98.9 F (37.2 C) (Oral)  Resp 18  SpO2 100%   Physical Exam  Constitutional:  He appears well-developed and well-nourished.  HENT:  Head: Normocephalic and atraumatic.  Eyes: Conjunctivae are normal. Right eye exhibits no discharge. Left eye exhibits no discharge.  Neck: Normal range of motion. Neck supple.  Cardiovascular: Normal rate, regular rhythm and normal heart sounds.   Pulmonary/Chest: Effort normal and breath sounds normal. No respiratory distress. He has no wheezes. He has no rales.  Abdominal: Soft. There is no tenderness.  Neurological: He is alert.  Skin: Skin is warm and dry.  There is a wound overlying the mid-tibia that is crusted with mild yellow drainage. Area is somewhat tender with mild warmth of the lower leg with mild associated pedal edema.   Psychiatric: He has a normal mood and affect.  Nursing note and vitals reviewed.   ED Course  Procedures (including critical care time) Labs Review Labs Reviewed  BASIC METABOLIC PANEL - Abnormal; Notable for the following:    Chloride 100 (*)    Glucose, Bld 511 (*)    All other components within normal limits  CBC - Abnormal; Notable for the following:    RBC 3.60 (*)    Hemoglobin 10.9 (*)    HCT 32.1 (*)    All other components within normal limits  CBG MONITORING, ED - Abnormal; Notable for the following:     Glucose-Capillary 531 (*)    All other components within normal limits  CBG MONITORING, ED - Abnormal; Notable for the following:    Glucose-Capillary 498 (*)    All other components within normal limits  CBG MONITORING, ED - Abnormal; Notable for the following:    Glucose-Capillary 372 (*)    All other components within normal limits  URINALYSIS, ROUTINE W REFLEX MICROSCOPIC (NOT AT [facility])  I-STAT CG4 LACTIC ACID, ED    Imaging Review Dg Tibia/fibula Right  12/08/2015  CLINICAL DATA:  40 year old male with wound on the medial aspect of the tibia. EXAM: RIGHT TIBIA AND FIBULA - 2 VIEW COMPARISON:  Radiograph dated 11/30/2015 FINDINGS: There is no acute fracture or dislocation. The bones are well mineralized. There is a 3 cm focal area irregularity/ defect in the anterior skin along the proximal aspect of the tibia with mild swelling of the adjacent subcutaneous soft tissue compatible with known skin wound. No radiopaque foreign object or soft tissue gas identified within this area. IMPRESSION: No acute osseous pathology. Skin wound in the proximal aspect of the shin. No radiopaque foreign object or soft tissue gas identified. Electronically Signed   By: Elgie Collard M.D.   On: 12/08/2015 23:23   I have personally reviewed and evaluated these images and lab results as part of my medical decision-making.   10:23 PM Patient seen and examined. Work-up initiated. Medications ordered.   Vital signs reviewed and are as follows: BP 129/97 mmHg  Pulse 110  Temp(Src) 98.9 F (37.2 C) (Oral)  Resp 18  SpO2 100%  12:57 AM Patient discussed with and seen by Dr. Ethelda Chick.   Hyperglycemia treated with fluids, improved into the 300s. Pulse rate is now improved to 89. Lactate is normal. X-ray is reassuring regarding osteomyelitis. Feel patient can be discharged home with close PCP follow-up. Patient started on Keflex and Bactrim. Patient states he will follow-up with his PCP this week. He is  comfortable with this plan.  Pt urged to return with worsening pain, worsening swelling, expanding area of redness or streaking up extremity, fever, or any other concerns. Urged to take complete course of antibiotics as prescribed. Pt verbalizes understanding  and agrees with plan.   MDM   Final diagnoses:  Cellulitis of right lower extremity  Hyperglycemia without ketosis   Patient with mild diabetic wound of the right anterior lower leg. No signs of osteomyelitis on x-ray. White blood cell count is normal. Lactate is normal. Patient appears well, toxic. No fever. Patient has PCP and wound care follow-up. There is some mild warmth and swelling around the wound and likely represents early cellulitis. No definite indications for admission at this time given follow-up. Return instructions as above. Patient seems reliable to return if symptoms are worsening. Patient now has access to his insulin and metformin which he states he will take.   Renne Crigler, PA-C 12/09/15 0100  Doug Sou, MD 12/09/15 [phone]

## 2015-12-08 NOTE — ED Notes (Signed)
Pt complains of being hyperglycemic and having a diabetic ulcer on his right lower leg that's getting worse

## 2015-12-09 LAB — CBG MONITORING, ED: Glucose-Capillary: 372 mg/dL — ABNORMAL HIGH (ref 65–99)

## 2015-12-09 MED ORDER — SULFAMETHOXAZOLE-TRIMETHOPRIM 800-160 MG PO TABS
1.0000 | ORAL_TABLET | Freq: Once | ORAL | Status: AC
Start: 2015-12-09 — End: 2015-12-09
  Administered 2015-12-09: 1 via ORAL
  Filled 2015-12-09: qty 1

## 2015-12-09 MED ORDER — CIPROFLOXACIN HCL 500 MG PO TABS: 500.0000 mg | ORAL_TABLET | Freq: Once | ORAL | Status: DC

## 2015-12-09 MED ORDER — CEPHALEXIN 500 MG PO CAPS
500.0000 mg | ORAL_CAPSULE | Freq: Once | ORAL | Status: AC
  Administered 2015-12-09: 500 mg via ORAL
  Filled 2015-12-09: qty 1

## 2015-12-09 MED ORDER — SULFAMETHOXAZOLE-TRIMETHOPRIM 800-160 MG PO TABS: 1.0000 | ORAL_TABLET | Freq: Two times a day (BID) | ORAL | Status: AC

## 2015-12-09 MED ORDER — CEPHALEXIN 500 MG PO CAPS: 500.0000 mg | ORAL_CAPSULE | Freq: Four times a day (QID) | ORAL | Status: DC

## 2015-12-09 NOTE — ED Notes (Signed)
Patient was alert, oriented and stable upon discharge. RN went over AVS and patient had no further questions.  

## 2015-12-09 NOTE — Discharge Instructions (Signed)
Please read and follow all provided instructions.  Your diagnoses today include:  1. Cellulitis of right lower extremity   2. Hyperglycemia without ketosis     Tests performed today include:  Vital signs. See below for your results today.   Blood counts and electrolytes - normal infection fighting cells, elevated blood sugar  X-ray - no bone involvement of infection seen  Medications prescribed:   Keflex (cephalexin) - antibiotic  You have been prescribed an antibiotic medicine: take the entire course of medicine even if you are feeling better. Stopping early can cause the antibiotic not to work.   Bactrim (trimethoprim/sulfamethoxazole) - antibiotic  You have been prescribed an antibiotic medicine: take the entire course of medicine even if you are feeling better. Stopping early can cause the antibiotic not to work.  Take any prescribed medications only as directed.   Home care instructions:  Follow any educational materials contained in this packet. Keep affected area above the level of your heart when possible. Wash area gently twice a day with warm soapy water. Do not apply alcohol or hydrogen peroxide. Cover the area if it draining or weeping.   Follow-up instructions: Please follow-up with your primary care provider in the next 48 - 72 hours for further evaluation of your symptoms.   Return instructions:  Return to the Emergency Department if you have:  Fever  Worsening symptoms  Worsening pain  Worsening swelling  Redness of the skin that moves away from the affected area, especially if it streaks away from the affected area   Any other emergent concerns  Your vital signs today were: BP 135/98 mmHg   Pulse 89   Temp(Src) 98.9 F (37.2 C) (Oral)   Resp 15   SpO2 100% If your blood pressure (BP) was elevated above 135/85 this visit, please have this repeated by your doctor within one month. --------------

## 2015-12-09 NOTE — ED Notes (Addendum)
It was explained to the pt that we need a urine sample for a urinalysis to check for a urinary tract infection. Pt stated that he couldn't and said 'ya'll can wait, i need more water.'

## 2015-12-13 ENCOUNTER — Encounter (HOSPITAL_COMMUNITY): Payer: Self-pay | Admitting: Emergency Medicine

## 2015-12-13 ENCOUNTER — Emergency Department (HOSPITAL_COMMUNITY)
Admission: EM | Admit: 2015-12-13 | Discharge: 2015-12-13 | Disposition: A | Discharge status: Home / Self Care | Payer: BLUE CROSS/BLUE SHIELD | Attending: Emergency Medicine | Admitting: Emergency Medicine

## 2015-12-13 DIAGNOSIS — R739 Hyperglycemia, unspecified: Secondary | ICD-10-CM | POA: Diagnosis present

## 2015-12-13 DIAGNOSIS — Z7982 Long term (current) use of aspirin: Secondary | ICD-10-CM | POA: Insufficient documentation

## 2015-12-13 DIAGNOSIS — R531 Weakness: Secondary | ICD-10-CM | POA: Insufficient documentation

## 2015-12-13 DIAGNOSIS — F17210 Nicotine dependence, cigarettes, uncomplicated: Secondary | ICD-10-CM | POA: Insufficient documentation

## 2015-12-13 DIAGNOSIS — F329 Major depressive disorder, single episode, unspecified: Secondary | ICD-10-CM | POA: Diagnosis not present

## 2015-12-13 DIAGNOSIS — J45909 Unspecified asthma, uncomplicated: Secondary | ICD-10-CM | POA: Diagnosis not present

## 2015-12-13 DIAGNOSIS — M1990 Unspecified osteoarthritis, unspecified site: Secondary | ICD-10-CM | POA: Diagnosis not present

## 2015-12-13 DIAGNOSIS — Z79899 Other long term (current) drug therapy: Secondary | ICD-10-CM | POA: Diagnosis not present

## 2015-12-13 DIAGNOSIS — Z7984 Long term (current) use of oral hypoglycemic drugs: Secondary | ICD-10-CM | POA: Insufficient documentation

## 2015-12-13 DIAGNOSIS — E1165 Type 2 diabetes mellitus with hyperglycemia: Secondary | ICD-10-CM | POA: Diagnosis not present

## 2015-12-13 DIAGNOSIS — I10 Essential (primary) hypertension: Secondary | ICD-10-CM | POA: Insufficient documentation

## 2015-12-13 LAB — URINALYSIS, ROUTINE W REFLEX MICROSCOPIC
Bilirubin Urine: NEGATIVE
HGB URINE DIPSTICK: NEGATIVE
Ketones, ur: NEGATIVE mg/dL
Leukocytes, UA: NEGATIVE
Nitrite: NEGATIVE
PH: 5.5 (ref 5.0–8.0)
Protein, ur: NEGATIVE mg/dL
SPECIFIC GRAVITY, URINE: 1.043 — AB (ref 1.005–1.030)

## 2015-12-13 LAB — BASIC METABOLIC PANEL
Anion gap: 8 (ref 5–15)
BUN: 11 mg/dL (ref 6–20)
CHLORIDE: 101 mmol/L (ref 101–111)
CO2: 26 mmol/L (ref 22–32)
CREATININE: 0.94 mg/dL (ref 0.61–1.24)
Calcium: 9 mg/dL (ref 8.9–10.3)
GFR calc non Af Amer: >60 mL/min (ref >60)
Glucose, Bld: 373 mg/dL — ABNORMAL HIGH (ref 65–99)
Potassium: 3.5 mmol/L (ref 3.5–5.1)
Sodium: 135 mmol/L (ref 135–145)

## 2015-12-13 LAB — CBC
HEMATOCRIT: 33.8 % — AB (ref 39.0–52.0)
Hemoglobin: 11.9 g/dL — ABNORMAL LOW (ref 13.0–17.0)
MCH: 31 pg (ref 26.0–34.0)
MCHC: 35.2 g/dL (ref 30.0–36.0)
MCV: 88 fL (ref 78.0–100.0)
PLATELETS: 275 10*3/uL (ref 150–400)
RBC: 3.84 MIL/uL — AB (ref 4.22–5.81)
RDW: 13.1 % (ref 11.5–15.5)
WBC: 6.5 10*3/uL (ref 4.0–10.5)

## 2015-12-13 LAB — URINE MICROSCOPIC-ADD ON
BACTERIA UA: NONE SEEN
RBC / HPF: NONE SEEN RBC/hpf (ref 0–5)
Squamous Epithelial / LPF: NONE SEEN

## 2015-12-13 LAB — CBG MONITORING, ED
Glucose-Capillary: 364 mg/dL — ABNORMAL HIGH (ref 65–99)
Glucose-Capillary: 304 mg/dL — ABNORMAL HIGH (ref 65–99)

## 2015-12-13 MED ORDER — SODIUM CHLORIDE 0.9 % IV BOLUS (SEPSIS)
500.0000 mL | Freq: Once | INTRAVENOUS | Status: AC
  Administered 2015-12-13: 500 mL via INTRAVENOUS

## 2015-12-13 MED ORDER — METFORMIN HCL 1000 MG PO TABS: 1000.0000 mg | ORAL_TABLET | Freq: Two times a day (BID) | ORAL | Status: DC

## 2015-12-13 MED ORDER — SODIUM CHLORIDE 0.9 % IV BOLUS (SEPSIS)
1000.0000 mL | Freq: Once | INTRAVENOUS | Status: AC
  Administered 2015-12-13: 1000 mL via INTRAVENOUS

## 2015-12-13 NOTE — ED Notes (Signed)
Pt wheeled out of ED. Was asked to sign out and refused. States "Im not signing that if you arent given me a way home." Pt given his d/c papers and informed that he would be able to use the phone in the lobby. This nurse attempted to call his family for a ride. No answer.

## 2015-12-13 NOTE — ED Provider Notes (Signed)
CSN: [id number]     Arrival date & time 12/13/15  1455 History   First MD Initiated Contact with Patient 12/13/15 1506     Chief Complaint  Patient presents with  . Weakness  . Hyperglycemia     (Consider location/radiation/quality/duration/timing/severity/associated sxs/prior Treatment) HPI Comments: 40 year old male with history of diabetes presents for weakness, vomiting, polydipsia. Patient says that he has not been taking his diabetic medication over the last few days. He states that his blood sugar was reading high at home. He denies any fevers or chills. No abdominal pain. He was seen a few days ago and diagnosed with cellulitis and wound infection and was started on Bactrim and Keflex. He reports the wounds and cellulitis have gotten significantly better although he reports he stopped taking the antibiotics. He denies other complaints at this time.  Patient is a 40 y.o. male presenting with weakness and hyperglycemia.  Weakness Pertinent negatives include no chest pain, no abdominal pain, no headaches and no shortness of breath.  Hyperglycemia Associated symptoms: fatigue, increased thirst, nausea, polyuria, vomiting and weakness   Associated symptoms: no abdominal pain, no chest pain, no dizziness, no dysuria, no fever and no shortness of breath     Past Medical History  Diagnosis Date  . Depression   . Diabetes mellitus   . Hyperlipidemia   . HTN (hypertension)   . Chronic headaches   . Osteoarthritis   . Kidney stones   . Polysubstance abuse     cocaine, marijuana-quit summer 2010  . Tobacco abuse   . Asthma   . Chest pain   . Lumbar disc disease   . GERD (gastroesophageal reflux disease)   . OSA (obstructive sleep apnea)   . Peripheral neuropathy (HCC) 02/01/2011  . Bipolar 1 disorder (HCC)   . Ingrown nail 11/12/2013   Past Surgical History  Procedure Laterality Date  . Knee surgery    . Bunionectomy  06-2009   Family History  Problem Relation Age of Onset    . Heart disease Mother   . Diabetes Father   . Hyperlipidemia Father   . Emphysema Maternal Uncle   . Asthma Sister   . Asthma Mother   . Stroke Mother   . Hypertension Father   . Alzheimer's disease Maternal Grandmother   . Depression Father   . Breast cancer Maternal Aunt   . COPD Mother     maternal un   Social History  Substance Use Topics  . Smoking status: Current Some Day Smoker -- 0.20 packs/day for 17 years    Types: Cigarettes    Last Attempt to Quit: 02/24/2011  . Smokeless tobacco: Former NeurosurgeonUser  . Alcohol Use: No     Comment: socially    Review of Systems  Constitutional: Positive for fatigue. Negative for fever and chills.  HENT: Negative for congestion, postnasal drip and rhinorrhea.   Eyes: Negative for visual disturbance.  Respiratory: Negative for cough, chest tightness and shortness of breath.   Cardiovascular: Negative for chest pain and palpitations.  Gastrointestinal: Positive for nausea and vomiting. Negative for abdominal pain and diarrhea.  Endocrine: Positive for polydipsia, polyphagia and polyuria.  Genitourinary: Positive for frequency. Negative for dysuria, urgency and hematuria.  Musculoskeletal: Negative for myalgias and back pain.  Skin: Negative for rash.  Neurological: Positive for weakness. Negative for dizziness, light-headedness and headaches.  Hematological: Does not bruise/bleed easily.      Allergies  Lipitor; Nystatin; Statins; Iodine; and Shellfish-derived products  Home Medications  Prior to Admission medications   Medication Sig Start Date End Date Taking? Authorizing Provider  albuterol (PROVENTIL HFA;VENTOLIN HFA) 108 (90 Base) MCG/ACT inhaler Inhale 2 puffs into the lungs every 6 (six) hours as needed for wheezing or shortness of breath.   Yes Historical Provider, MD  aspirin EC 81 MG tablet Take 81 mg by mouth daily.   Yes Historical Provider, MD  budesonide-formoterol (SYMBICORT) 80-4.5 MCG/ACT inhaler Inhale 2 puffs  into the lungs 2 (two) times daily.   Yes Historical Provider, MD  buPROPion (WELLBUTRIN XL) 300 MG 24 hr tablet Take 300 mg by mouth daily. Reported on 12/08/2015   Yes Historical Provider, MD  cephALEXin (KEFLEX) 500 MG capsule Take 1 capsule (500 mg total) by mouth 4 (four) times daily. 12/09/15  Yes Renne Crigler, PA-C  cholecalciferol (VITAMIN D) 1000 units tablet Take 1,000 Units by mouth daily.   Yes Historical Provider, MD  ezetimibe (ZETIA) 10 MG tablet Take 10 mg by mouth daily.   Yes Historical Provider, MD  gabapentin (NEURONTIN) 300 MG capsule Take 300 mg by mouth 3 (three) times daily.    Yes Historical Provider, MD  glucosamine-chondroitin 500-400 MG tablet Take 1 tablet by mouth daily.   Yes Historical Provider, MD  loratadine (CLARITIN) 10 MG tablet Take 10 mg by mouth daily as needed for allergies.   Yes Historical Provider, MD  losartan (COZAAR) 25 MG tablet Take 25 mg by mouth daily.   Yes Historical Provider, MD  meloxicam (MOBIC) 15 MG tablet Take 15 mg by mouth daily.   Yes Historical Provider, MD  methocarbamol (ROBAXIN) 500 MG tablet Take 500 mg by mouth every 8 (eight) hours as needed for muscle spasms.   Yes Historical Provider, MD  omeprazole (PRILOSEC) 40 MG capsule Take 40 mg by mouth daily.   Yes Historical Provider, MD  saxagliptin HCl (ONGLYZA) 2.5 MG TABS tablet Take 2.5 mg by mouth daily.   Yes Historical Provider, MD  sertraline (ZOLOFT) 100 MG tablet Take 100 mg by mouth at bedtime.   Yes Historical Provider, MD  sulfamethoxazole-trimethoprim (BACTRIM DS,SEPTRA DS) 800-160 MG tablet Take 1 tablet by mouth 2 (two) times daily. 12/09/15 12/16/15 Yes Renne Crigler, PA-C  traMADol (ULTRAM) 50 MG tablet Take 50 mg by mouth every 6 (six) hours as needed for moderate pain or severe pain (pain).    Yes Historical Provider, MD  vitamin B-12 (CYANOCOBALAMIN) 1000 MCG tablet Take 1,000 mcg by mouth daily. Reported on 12/08/2015   Yes Historical Provider, MD  zolpidem (AMBIEN) 5  MG tablet Take 5 mg by mouth at bedtime as needed for sleep. Reported on 12/08/2015   Yes Historical Provider, MD  metFORMIN (GLUCOPHAGE) 1000 MG tablet Take 1 tablet (1,000 mg total) by mouth 2 (two) times daily with a meal. 12/13/15   Leta Baptist, MD   BP 114/86 mmHg  Pulse 91  Temp(Src) 98 F (36.7 C) (Oral)  Resp 16  SpO2 99% Physical Exam  Constitutional: He is oriented to person, place, and time. He appears cachectic. No distress.  HENT:  Head: Normocephalic and atraumatic.  Right Ear: External ear normal.  Left Ear: External ear normal.  Mouth/Throat: Oropharynx is clear and moist. No oropharyngeal exudate.  Eyes: EOM are normal. Pupils are equal, round, and reactive to light.  Neck: Normal range of motion. Neck supple.  Cardiovascular: Normal rate, regular rhythm, normal heart sounds and intact distal pulses.   No murmur heard. Pulmonary/Chest: Effort normal. No respiratory distress. He has  no wheezes. He has no rales.  Abdominal: Soft. He exhibits no distension. There is no tenderness.  Musculoskeletal: He exhibits no edema.  Neurological: He is alert and oriented to person, place, and time.  Skin: Skin is warm and dry. No ecchymosis and no rash noted. Rash is not pustular and not vesicular. He is not diaphoretic.     Vitals reviewed.   ED Course  Procedures (including critical care time) Labs Review Labs Reviewed  BASIC METABOLIC PANEL - Abnormal; Notable for the following:    Glucose, Bld 373 (*)    All other components within normal limits  CBC - Abnormal; Notable for the following:    RBC 3.84 (*)    Hemoglobin 11.9 (*)    HCT 33.8 (*)    All other components within normal limits  URINALYSIS, ROUTINE W REFLEX MICROSCOPIC (NOT AT [facility]) - Abnormal; Notable for the following:    Specific Gravity, Urine 1.043 (*)    Glucose, UA >1000 (*)    All other components within normal limits  CBG MONITORING, ED - Abnormal; Notable for the following:     Glucose-Capillary 364 (*)    All other components within normal limits  CBG MONITORING, ED - Abnormal; Notable for the following:    Glucose-Capillary 304 (*)    All other components within normal limits  URINE MICROSCOPIC-ADD ON    Imaging Review No results found. I have personally reviewed and evaluated these images and lab results as part of my medical decision-making.   EKG Interpretation None      MDM  Patient was seen and evaluated in stable condition. Pate patient hyperglycemic without acidosis. He was given IV fluids and on reevaluation said he felt significantly better.  Discussed need for medical compliance with patient. He expressed understanding. He was provided with a new prescription for his metformin. He was discharged home with instruction to follow-up with his primary care physician. Final diagnoses:  Hyperglycemia    1. Hyperglycemia    Leta Baptist, MD 12/13/15 [phone]

## 2015-12-13 NOTE — ED Notes (Signed)
Bed: [id number] Expected date: 12/13/15 Expected time: 2:54 PM Means of arrival:  Comments: EMS, Hypoglycemia

## 2015-12-13 NOTE — Discharge Instructions (Signed)
You were seen and evaluated today for your high blood sugar. There is no acid building up in your blood at this time. Please take your medications as prescribed. You've been provided a new prescription for your metformin. Follow-up with your primary care physician as soon as possible.  Hyperglycemia Hyperglycemia occurs when the glucose (sugar) in your blood is too high. Hyperglycemia can happen for many reasons, but it most often happens to people who do not know they have diabetes or are not managing their diabetes properly.  CAUSES  Whether you have diabetes or not, there are other causes of hyperglycemia. Hyperglycemia can occur when you have diabetes, but it can also occur in other situations that you might not be as aware of, such as: Diabetes  If you have diabetes and are having problems controlling your blood glucose, hyperglycemia could occur because of some of the following reasons:  Not following your meal plan.  Not taking your diabetes medications or not taking it properly.  Exercising less or doing less activity than you normally do.  Being sick. Pre-diabetes  This cannot be ignored. Before people develop Type 2 diabetes, they almost always have "pre-diabetes." This is when your blood glucose levels are higher than normal, but not yet high enough to be diagnosed as diabetes. Research has shown that some long-term damage to the body, especially the heart and circulatory system, may already be occurring during pre-diabetes. If you take action to manage your blood glucose when you have pre-diabetes, you may delay or prevent Type 2 diabetes from developing. Stress  If you have diabetes, you may be "diet" controlled or on oral medications or insulin to control your diabetes. However, you may find that your blood glucose is higher than usual in the hospital whether you have diabetes or not. This is often referred to as "stress hyperglycemia." Stress can elevate your blood glucose. This  happens because of hormones put out by the body during times of stress. If stress has been the cause of your high blood glucose, it can be followed regularly by your caregiver. That way he/she can make sure your hyperglycemia does not continue to get worse or progress to diabetes. Steroids  Steroids are medications that act on the infection fighting system (immune system) to block inflammation or infection. One side effect can be a rise in blood glucose. Most people can produce enough extra insulin to allow for this rise, but for those who cannot, steroids make blood glucose levels go even higher. It is not unusual for steroid treatments to "uncover" diabetes that is developing. It is not always possible to determine if the hyperglycemia will go away after the steroids are stopped. A special blood test called an A1c is sometimes done to determine if your blood glucose was elevated before the steroids were started. SYMPTOMS  Thirsty.  Frequent urination.  Dry mouth.  Blurred vision.  Tired or fatigue.  Weakness.  Sleepy.  Tingling in feet or leg. DIAGNOSIS  Diagnosis is made by monitoring blood glucose in one or all of the following ways:  A1c test. This is a chemical found in your blood.  Fingerstick blood glucose monitoring.  Laboratory results. TREATMENT  First, knowing the cause of the hyperglycemia is important before the hyperglycemia can be treated. Treatment may include, but is not be limited to:  Education.  Change or adjustment in medications.  Change or adjustment in meal plan.  Treatment for an illness, infection, etc.  More frequent blood glucose monitoring.  Change in exercise plan.  Decreasing or stopping steroids.  Lifestyle changes. HOME CARE INSTRUCTIONS   Test your blood glucose as directed.  Exercise regularly. Your caregiver will give you instructions about exercise. Pre-diabetes or diabetes which comes on with stress is helped by  exercising.  Eat wholesome, balanced meals. Eat often and at regular, fixed times. Your caregiver or nutritionist will give you a meal plan to guide your sugar intake.  Being at an ideal weight is important. If needed, losing as little as 10 to 15 pounds may help improve blood glucose levels. SEEK MEDICAL CARE IF:   You have questions about medicine, activity, or diet.  You continue to have symptoms (problems such as increased thirst, urination, or weight gain). SEEK IMMEDIATE MEDICAL CARE IF:   You are vomiting or have diarrhea.  Your breath smells fruity.  You are breathing faster or slower.  You are very sleepy or incoherent.  You have numbness, tingling, or pain in your feet or hands.  You have chest pain.  Your symptoms get worse even though you have been following your caregiver's orders.  If you have any other questions or concerns.   This information is not intended to replace advice given to you by your health care provider. Make sure you discuss any questions you have with your health care provider.   Document Released: 01/05/2001 Document Revised: 10/04/2011 Document Reviewed: 03/18/2015 Elsevier Interactive Patient Education [employer]

## 2015-12-13 NOTE — ED Notes (Addendum)
Per EMS pt coming of weakness, vomiting, and polydipsia for 2 days. Pt out of insulin for 4 days. 500 ml bolus 0.9% NaCl given en route with EMS.

## 2015-12-13 NOTE — ED Notes (Signed)
Pts mother called as requested to set up a ride. No answer. Message left. Pt able to call again from lobby.

## 2015-12-13 NOTE — ED Notes (Signed)
Made 2nd request for urine,pt unable to provide one at this time. 

## 2015-12-17 ENCOUNTER — Encounter (HOSPITAL_COMMUNITY): Payer: Self-pay | Admitting: Emergency Medicine

## 2015-12-17 ENCOUNTER — Emergency Department (HOSPITAL_COMMUNITY): Payer: BLUE CROSS/BLUE SHIELD

## 2015-12-17 ENCOUNTER — Emergency Department (HOSPITAL_COMMUNITY)
Admission: EM | Admit: 2015-12-17 | Discharge: 2015-12-17 | Disposition: A | Discharge status: Home / Self Care | Payer: BLUE CROSS/BLUE SHIELD | Attending: Emergency Medicine | Admitting: Emergency Medicine

## 2015-12-17 DIAGNOSIS — E1140 Type 2 diabetes mellitus with diabetic neuropathy, unspecified: Secondary | ICD-10-CM | POA: Insufficient documentation

## 2015-12-17 DIAGNOSIS — F319 Bipolar disorder, unspecified: Secondary | ICD-10-CM | POA: Diagnosis not present

## 2015-12-17 DIAGNOSIS — Z9114 Patient's other noncompliance with medication regimen: Secondary | ICD-10-CM | POA: Insufficient documentation

## 2015-12-17 DIAGNOSIS — Z792 Long term (current) use of antibiotics: Secondary | ICD-10-CM | POA: Insufficient documentation

## 2015-12-17 DIAGNOSIS — Z7984 Long term (current) use of oral hypoglycemic drugs: Secondary | ICD-10-CM | POA: Insufficient documentation

## 2015-12-17 DIAGNOSIS — M1990 Unspecified osteoarthritis, unspecified site: Secondary | ICD-10-CM | POA: Diagnosis not present

## 2015-12-17 DIAGNOSIS — Z7982 Long term (current) use of aspirin: Secondary | ICD-10-CM | POA: Insufficient documentation

## 2015-12-17 DIAGNOSIS — F17210 Nicotine dependence, cigarettes, uncomplicated: Secondary | ICD-10-CM | POA: Diagnosis not present

## 2015-12-17 DIAGNOSIS — E1165 Type 2 diabetes mellitus with hyperglycemia: Secondary | ICD-10-CM | POA: Diagnosis not present

## 2015-12-17 DIAGNOSIS — I10 Essential (primary) hypertension: Secondary | ICD-10-CM | POA: Insufficient documentation

## 2015-12-17 DIAGNOSIS — L97929 Non-pressure chronic ulcer of unspecified part of left lower leg with unspecified severity: Secondary | ICD-10-CM | POA: Insufficient documentation

## 2015-12-17 DIAGNOSIS — E785 Hyperlipidemia, unspecified: Secondary | ICD-10-CM | POA: Diagnosis not present

## 2015-12-17 DIAGNOSIS — R739 Hyperglycemia, unspecified: Secondary | ICD-10-CM

## 2015-12-17 DIAGNOSIS — J45909 Unspecified asthma, uncomplicated: Secondary | ICD-10-CM | POA: Insufficient documentation

## 2015-12-17 LAB — CBC WITH DIFFERENTIAL/PLATELET
Basophils Absolute: 0 10*3/uL (ref 0.0–0.1)
Basophils Relative: 0 %
EOS ABS: 0.1 10*3/uL (ref 0.0–0.7)
EOS PCT: 1 %
HCT: 35.4 % — ABNORMAL LOW (ref 39.0–52.0)
Hemoglobin: 12.4 g/dL — ABNORMAL LOW (ref 13.0–17.0)
LYMPHS ABS: 1.2 10*3/uL (ref 0.7–4.0)
Lymphocytes Relative: 27 %
MCH: 30.8 pg (ref 26.0–34.0)
MCHC: 35 g/dL (ref 30.0–36.0)
MCV: 88.1 fL (ref 78.0–100.0)
Monocytes Absolute: 0.5 10*3/uL (ref 0.1–1.0)
Monocytes Relative: 10 %
Neutro Abs: 2.8 10*3/uL (ref 1.7–7.7)
Neutrophils Relative %: 62 %
PLATELETS: 242 10*3/uL (ref 150–400)
RBC: 4.02 MIL/uL — AB (ref 4.22–5.81)
RDW: 13.3 % (ref 11.5–15.5)
WBC: 4.5 10*3/uL (ref 4.0–10.5)

## 2015-12-17 LAB — BASIC METABOLIC PANEL
Anion gap: 7 (ref 5–15)
BUN: 8 mg/dL (ref 6–20)
CHLORIDE: 103 mmol/L (ref 101–111)
CO2: 28 mmol/L (ref 22–32)
CREATININE: 0.85 mg/dL (ref 0.61–1.24)
Calcium: 9.3 mg/dL (ref 8.9–10.3)
GFR calc Af Amer: >60 mL/min (ref >60)
Glucose, Bld: 475 mg/dL — ABNORMAL HIGH (ref 65–99)
Potassium: 3.8 mmol/L (ref 3.5–5.1)
Sodium: 138 mmol/L (ref 135–145)

## 2015-12-17 LAB — CBG MONITORING, ED
GLUCOSE-CAPILLARY: 456 mg/dL — AB (ref 65–99)
GLUCOSE-CAPILLARY: 304 mg/dL — AB (ref 65–99)

## 2015-12-17 MED ORDER — SODIUM CHLORIDE 0.9 % IV BOLUS (SEPSIS)
1000.0000 mL | Freq: Once | INTRAVENOUS | Status: AC
  Administered 2015-12-17: 1000 mL via INTRAVENOUS

## 2015-12-17 NOTE — ED Notes (Signed)
Pt c/o pain to wound on right leg. Pt states he needs it rewrapped. Pt reports he was seen recently and his cast was removed.

## 2015-12-17 NOTE — ED Provider Notes (Addendum)
CSN: [id number]     Arrival date & time 12/17/15  1255 History   First MD Initiated Contact with Patient 12/17/15 1320     Chief Complaint  Patient presents with  . Wound Check     (Consider location/radiation/quality/duration/timing/severity/associated sxs/prior Treatment) HPI   Blood pressure 123/105, pulse 105, temperature 98.5 F (36.9 C), temperature source Oral, resp. rate 17, SpO2 100 %.  Wayne [name] is a 40 y.o. male with past medical history significant for polysubstance abuse, bipolar, uncontrolled diabetes presenting for evaluation of worsening left shin ulcer, originally it was secondary to a cast. He was seen for similar several weeks ago. Started on Bactrim and Keflex, states he's been compliant with these but the wound continues to worsen slightly. States his pain is moderate, he denies fevers, chills, nausea, vomiting. States he does not take insulin and is take his metformin only occasionally, he was referred to the wound care clinic but states he just didn't make the appointment to follow-up.  Past Medical History  Diagnosis Date  . Depression   . Diabetes mellitus   . Hyperlipidemia   . HTN (hypertension)   . Chronic headaches   . Osteoarthritis   . Kidney stones   . Polysubstance abuse     cocaine, marijuana-quit summer 2010  . Tobacco abuse   . Asthma   . Chest pain   . Lumbar disc disease   . GERD (gastroesophageal reflux disease)   . OSA (obstructive sleep apnea)   . Peripheral neuropathy (HCC) 02/01/2011  . Bipolar 1 disorder (HCC)   . Ingrown nail 11/12/2013   Past Surgical History  Procedure Laterality Date  . Knee surgery    . Bunionectomy  06-2009   Family History  Problem Relation Age of Onset  . Heart disease Mother   . Diabetes Father   . Hyperlipidemia Father   . Emphysema Maternal Uncle   . Asthma Sister   . Asthma Mother   . Stroke Mother   . Hypertension Father   . Alzheimer's disease Maternal Grandmother   . Depression Father   .  Breast cancer Maternal Aunt   . COPD Mother     maternal un   Social History  Substance Use Topics  . Smoking status: Current Some Day Smoker -- 0.20 packs/day for 17 years    Types: Cigarettes    Last Attempt to Quit: 02/24/2011  . Smokeless tobacco: Former Neurosurgeon  . Alcohol Use: No     Comment: socially    Review of Systems  10 systems reviewed and found to be negative, except as noted in the HPI.   Allergies  Nystatin; Statins; Iodine; and Shellfish-derived products  Home Medications   Prior to Admission medications   Medication Sig Start Date End Date Taking? Authorizing Provider  albuterol (PROVENTIL HFA;VENTOLIN HFA) 108 (90 Base) MCG/ACT inhaler Inhale 2 puffs into the lungs every 6 (six) hours as needed for wheezing or shortness of breath.   Yes Historical Provider, MD  aspirin EC 81 MG tablet Take 81 mg by mouth daily.   Yes Historical Provider, MD  budesonide-formoterol (SYMBICORT) 80-4.5 MCG/ACT inhaler Inhale 2 puffs into the lungs 2 (two) times daily.   Yes Historical Provider, MD  buPROPion (WELLBUTRIN XL) 300 MG 24 hr tablet Take 300 mg by mouth daily. Reported on 12/08/2015   Yes Historical Provider, MD  cholecalciferol (VITAMIN D) 1000 units tablet Take 1,000 Units by mouth daily.   Yes Historical Provider, MD  ezetimibe (ZETIA) 10  MG tablet Take 10 mg by mouth daily.   Yes Historical Provider, MD  gabapentin (NEURONTIN) 300 MG capsule Take 300 mg by mouth 3 (three) times daily.    Yes Historical Provider, MD  glucosamine-chondroitin 500-400 MG tablet Take 1 tablet by mouth daily.   Yes Historical Provider, MD  loratadine (CLARITIN) 10 MG tablet Take 10 mg by mouth daily as needed for allergies.   Yes Historical Provider, MD  losartan (COZAAR) 25 MG tablet Take 25 mg by mouth daily.   Yes Historical Provider, MD  meloxicam (MOBIC) 15 MG tablet Take 15 mg by mouth daily.   Yes Historical Provider, MD  metFORMIN (GLUCOPHAGE) 1000 MG tablet Take 1 tablet (1,000 mg  total) by mouth 2 (two) times daily with a meal. 12/13/15  Yes Leta Baptist, MD  methocarbamol (ROBAXIN) 500 MG tablet Take 500 mg by mouth every 8 (eight) hours as needed for muscle spasms.   Yes Historical Provider, MD  omeprazole (PRILOSEC) 40 MG capsule Take 40 mg by mouth daily.   Yes Historical Provider, MD  sertraline (ZOLOFT) 100 MG tablet Take 100 mg by mouth at bedtime.   Yes Historical Provider, MD  traMADol (ULTRAM) 50 MG tablet Take 50 mg by mouth every 6 (six) hours as needed for moderate pain or severe pain (pain).    Yes Historical Provider, MD  vitamin B-12 (CYANOCOBALAMIN) 1000 MCG tablet Take 1,000 mcg by mouth daily. Reported on 12/08/2015   Yes Historical Provider, MD  cephALEXin (KEFLEX) 500 MG capsule Take 1 capsule (500 mg total) by mouth 4 (four) times daily. Patient not taking: Reported on 12/17/2015 12/09/15   Renne Crigler, PA-C   BP 174/126 mmHg  Pulse 94  Temp(Src) 98.5 F (36.9 C) (Oral)  Resp 15  SpO2 100% Physical Exam  Constitutional: He is oriented to person, place, and time. He appears well-developed and well-nourished. No distress.  HENT:  Head: Normocephalic.  Eyes: Conjunctivae and EOM are normal.  Cardiovascular: Normal rate and regular rhythm.   Pulmonary/Chest: Effort normal and breath sounds normal. No stridor. No respiratory distress. He has no wheezes. He has no rales. He exhibits no tenderness.  Abdominal: Soft. Bowel sounds are normal. He exhibits no distension and no mass. There is no tenderness. There is no rebound and no guarding.  Musculoskeletal: Normal range of motion.  Neurological: He is alert and oriented to person, place, and time.  Skin:  2 cm ulceration to left mid-shin as pictured, no surrounding warmth, foul smell, purulent discharge. Scant serosanguinous discharge.  Psychiatric: He has a normal mood and affect.  Nursing note and vitals reviewed.     ED Course  Procedures (including critical care time) Labs Review Labs  Reviewed  BASIC METABOLIC PANEL - Abnormal; Notable for the following:    Glucose, Bld 475 (*)    All other components within normal limits  CBC WITH DIFFERENTIAL/PLATELET - Abnormal; Notable for the following:    RBC 4.02 (*)    Hemoglobin 12.4 (*)    HCT 35.4 (*)    All other components within normal limits  CBG MONITORING, ED - Abnormal; Notable for the following:    Glucose-Capillary 456 (*)    All other components within normal limits  CBG MONITORING, ED - Abnormal; Notable for the following:    Glucose-Capillary 304 (*)    All other components within normal limits    Imaging Review Dg Tibia/fibula Right  12/17/2015  CLINICAL DATA:  Anterior soft tissue wound, no known  injury, initial encounter EXAM: RIGHT TIBIA AND FIBULA - 2 VIEW COMPARISON:  12/10/2015 FINDINGS: Soft tissue wound is again identified and stable. No underlying bony erosion is noted. No acute bony abnormality is seen. IMPRESSION: Soft tissue wound without acute bony abnormality. Electronically Signed   By: Alcide CleverMark  Lukens M.D.   On: 12/17/2015 14:48   I have personally reviewed and evaluated these images and lab results as part of my medical decision-making.   EKG Interpretation None      MDM   Final diagnoses:  Ulcer of left lower leg, with unspecified severity (HCC)  Hyperglycemia without ketosis  Noncompliance with medications    Filed Vitals:   12/17/15 1310 12/17/15 1638  BP: 123/105 174/126  Pulse: 105 94  Temp: 98.5 F (36.9 C)   TempSrc: Oral   Resp: 17 15  SpO2: 100% 100%    Medications  sodium chloride 0.9 % bolus 1,000 mL (1,000 mLs Intravenous New Bag/Given 12/17/15 1706)  sodium chloride 0.9 % bolus 1,000 mL (0 mLs Intravenous Stopped 12/17/15 1706)    Wayne [name] is 40 y.o. male presenting with Nonhealing ulceration to left shin, patient has compliance issues, states that he finished his antibiotics but he does not regularly take his diabetic medications. He is afebrile, overall very  well appearing with mild tachycardia likely secondary to his hyperglycemia. Patient has a normal anion gap, no leukocytosis. X-ray without osteomyelitis. No indication for admission for this patient, will consult case management to arrange for home wound care. We've had an extensive discussion on the importance of compliance with diabetic medications. Patient verbalized his understanding, he realizes that the wound will not help when his blood sugars are elevated.  Discussed with case management became Noelle PennerGibbs who states that he is not eligible for any home health wound care.   Dates that he has his metformin at home, does not need a prescription refill.  This is a shared visit with the attending physician who personally evaluated the patient and agrees with the care plan.   Evaluation does not show pathology that would require ongoing emergent intervention or inpatient treatment. Pt is hemodynamically stable and mentating appropriately. Discussed findings and plan with patient/guardian, who agrees with care plan. All questions answered. Return precautions discussed and outpatient follow up given.       Wynetta Emeryicole Marjarie Irion, PA-C 12/17/15 1754  Azalia BilisKevin Campos, MD 12/17/15 [phone]  Discussed with case management wander who states she can arrange for home health, put in ambulatory referral for RN wound care.  Wynetta Emeryicole Findley Blankenbaker, PA-C 12/17/15 1849  Azalia BilisKevin Campos, MD 12/19/15 [phone]

## 2015-12-17 NOTE — Care Management Note (Signed)
Case Management Note  Patient Details  Name: Wayne [name] A Nordling MRN: [id number] Date of Birth: 04/07/1976  Subjective/Objective:                 [facility]  Action/Plan: CM spoke with patient concerning recommendations for [facility] services for wound care, patient is agreeable with the recommendations. Patient states [facility].. Offered choice, patient selected [facility]. [phone]. Discussed discharge plan with patient, he verbalized understanding teach back done. No further questions or concerns verbalized,  Updated N. Pisciotta PA-C on discharge plan.  No further ED CM needs identified.  Expected Discharge Date:    12/16/2015              Expected Discharge Plan:  Home w Home Health Services  In-House Referral:     Discharge planning Services  CM Consult  Post Acute Care Choice:  Home Health Choice offered to:  Patient  DME Arranged:    DME Agency:     HH Arranged:  RN (Wound Care) HH Agency:  [facility]  Status of Service:  Completed, signed off  Medicare Important Message Given:    Date Medicare IM Given:    Medicare IM give by:    Date Additional Medicare IM Given:    Additional Medicare Important Message give by:     If discussed at Long Length of Stay Meetings, dates discussed:    Additional CommentsMichel Bickers:  Mauricia Mertens, RN 12/17/2015, 5:59 PM

## 2015-12-17 NOTE — ED Notes (Signed)
Bed: WA17 Expected date:  Expected time:  Means of arrival:  Comments: RM 29

## 2015-12-17 NOTE — Progress Notes (Signed)
ED CM review Cm consult and a few EPIC notes for background information Spoke with [place] PA

## 2015-12-17 NOTE — ED Provider Notes (Signed)
MSE was initiated and I personally evaluated the patient and placed orders (if any) at  1:50 PM on Dec 17, 2015.  The patient appears stable so that the remainder of the MSE may be completed by another provider.  Wayne [name] is a 40 y.o. male with a history of depression, DM, HLD, HTN, who presents to the Emergency Department complaining of pain to a wound on his right anterior lower leg that began 1-2 weeks ago. Per chart review, patient was seen in the ED here on 12/08/15, about 9 days ago, for the same symptoms. He had an x-ray done at that time which was negative for any acute fracture or osteomyelitis. He also had a CBG at that time which showed hyperglycemia (CBG of 531 reduced to 372 with fluids). Patient was discharged with Keflex and Bactrim, which he states he had finished. Patient was instructed to follow up with his PCP and the [facility] Care Center, but he states he has not gone to either. Patient reports he has had no money to buy wound care materials to wrap his wound. He denies fever.   Patient states he states he had eaten and had not taken any insulin today. He states he has been taking metformin and insulin.  PCP: Lupe Carneyean Mitchell, MD   On exam patient is afebrile and nontoxic-appearing. He is slightly tachycardic with a heart rate of 108. His blood glucose is 457. Patient has a large ulcer to his right anterior shin that is weeping serous fluid. He is not on current antibiotic therapy. He is neurovascularly intact. The patient was initially placed in fast track. We'll move patient to acute side and have blood work and fluids started. I also ordered right tibia/fibula x-ray. The remainder of work up will be completed by acute side provider.   Wayne FarrierWilliam Corley Maffeo, PA-C 12/17/15 1401  Azalia BilisKevin Campos, MD 12/17/15 1538

## 2015-12-17 NOTE — Discharge Instructions (Signed)
It is very important that you take all of your medications as prescribed.   Please make an appointment at the wound care clinic as soon as possible.  Wash the wound morning and night and dress it with the equipment you were given in the emergency department today.  Please follow with your primary care doctor in the next 2 days for a check-up. They must obtain records for further management.   Do not hesitate to return to the Emergency Department for any new, worsening or concerning symptoms.

## 2015-12-27 ENCOUNTER — Emergency Department (HOSPITAL_COMMUNITY)
Admission: EM | Admit: 2015-12-27 | Discharge: 2015-12-28 | Disposition: A | Discharge status: Home / Self Care | Payer: BLUE CROSS/BLUE SHIELD | Attending: Emergency Medicine | Admitting: Emergency Medicine

## 2015-12-27 ENCOUNTER — Emergency Department (HOSPITAL_COMMUNITY): Payer: BLUE CROSS/BLUE SHIELD

## 2015-12-27 ENCOUNTER — Encounter (HOSPITAL_COMMUNITY): Payer: Self-pay | Admitting: Emergency Medicine

## 2015-12-27 DIAGNOSIS — L97919 Non-pressure chronic ulcer of unspecified part of right lower leg with unspecified severity: Secondary | ICD-10-CM | POA: Diagnosis not present

## 2015-12-27 DIAGNOSIS — G629 Polyneuropathy, unspecified: Secondary | ICD-10-CM | POA: Insufficient documentation

## 2015-12-27 DIAGNOSIS — Z7984 Long term (current) use of oral hypoglycemic drugs: Secondary | ICD-10-CM | POA: Insufficient documentation

## 2015-12-27 DIAGNOSIS — Z7982 Long term (current) use of aspirin: Secondary | ICD-10-CM | POA: Insufficient documentation

## 2015-12-27 DIAGNOSIS — K219 Gastro-esophageal reflux disease without esophagitis: Secondary | ICD-10-CM | POA: Diagnosis not present

## 2015-12-27 DIAGNOSIS — G8929 Other chronic pain: Secondary | ICD-10-CM | POA: Diagnosis not present

## 2015-12-27 DIAGNOSIS — Z79899 Other long term (current) drug therapy: Secondary | ICD-10-CM | POA: Insufficient documentation

## 2015-12-27 DIAGNOSIS — Z791 Long term (current) use of non-steroidal anti-inflammatories (NSAID): Secondary | ICD-10-CM | POA: Insufficient documentation

## 2015-12-27 DIAGNOSIS — Z9119 Patient's noncompliance with other medical treatment and regimen: Secondary | ICD-10-CM | POA: Diagnosis not present

## 2015-12-27 DIAGNOSIS — Z7951 Long term (current) use of inhaled steroids: Secondary | ICD-10-CM | POA: Diagnosis not present

## 2015-12-27 DIAGNOSIS — M1990 Unspecified osteoarthritis, unspecified site: Secondary | ICD-10-CM | POA: Diagnosis not present

## 2015-12-27 DIAGNOSIS — F319 Bipolar disorder, unspecified: Secondary | ICD-10-CM | POA: Diagnosis not present

## 2015-12-27 DIAGNOSIS — F17210 Nicotine dependence, cigarettes, uncomplicated: Secondary | ICD-10-CM | POA: Insufficient documentation

## 2015-12-27 DIAGNOSIS — Z87442 Personal history of urinary calculi: Secondary | ICD-10-CM | POA: Insufficient documentation

## 2015-12-27 DIAGNOSIS — R739 Hyperglycemia, unspecified: Secondary | ICD-10-CM

## 2015-12-27 DIAGNOSIS — I10 Essential (primary) hypertension: Secondary | ICD-10-CM | POA: Diagnosis not present

## 2015-12-27 DIAGNOSIS — E1165 Type 2 diabetes mellitus with hyperglycemia: Secondary | ICD-10-CM | POA: Diagnosis not present

## 2015-12-27 DIAGNOSIS — Z9114 Patient's other noncompliance with medication regimen: Secondary | ICD-10-CM

## 2015-12-27 LAB — CBC
HEMATOCRIT: 39.2 % (ref 39.0–52.0)
HEMOGLOBIN: 13.2 g/dL (ref 13.0–17.0)
MCH: 30.6 pg (ref 26.0–34.0)
MCHC: 33.7 g/dL (ref 30.0–36.0)
MCV: 90.7 fL (ref 78.0–100.0)
Platelets: 188 10*3/uL (ref 150–400)
RBC: 4.32 MIL/uL (ref 4.22–5.81)
RDW: 13.3 % (ref 11.5–15.5)
WBC: 5.3 10*3/uL (ref 4.0–10.5)

## 2015-12-27 LAB — URINALYSIS, ROUTINE W REFLEX MICROSCOPIC
BILIRUBIN URINE: NEGATIVE
HGB URINE DIPSTICK: NEGATIVE
KETONES UR: 15 mg/dL — AB
Leukocytes, UA: NEGATIVE
Nitrite: NEGATIVE
PH: 5.5 (ref 5.0–8.0)
Protein, ur: NEGATIVE mg/dL
SPECIFIC GRAVITY, URINE: 1.045 — AB (ref 1.005–1.030)

## 2015-12-27 LAB — CBG MONITORING, ED
GLUCOSE-CAPILLARY: 312 mg/dL — AB (ref 65–99)
Glucose-Capillary: 354 mg/dL — ABNORMAL HIGH (ref 65–99)
Glucose-Capillary: 270 mg/dL — ABNORMAL HIGH (ref 65–99)

## 2015-12-27 LAB — BASIC METABOLIC PANEL
ANION GAP: 8 (ref 5–15)
BUN: 7 mg/dL (ref 6–20)
CO2: 29 mmol/L (ref 22–32)
Calcium: 9.8 mg/dL (ref 8.9–10.3)
Chloride: 101 mmol/L (ref 101–111)
Creatinine, Ser: 0.92 mg/dL (ref 0.61–1.24)
GFR calc Af Amer: >60 mL/min (ref >60)
GFR calc non Af Amer: >60 mL/min (ref >60)
GLUCOSE: 360 mg/dL — AB (ref 65–99)
POTASSIUM: 3.9 mmol/L (ref 3.5–5.1)
Sodium: 138 mmol/L (ref 135–145)

## 2015-12-27 LAB — I-STAT VENOUS BLOOD GAS, ED
Acid-Base Excess: 5 mmol/L — ABNORMAL HIGH (ref 0.0–2.0)
BICARBONATE: 31 meq/L — AB (ref 20.0–24.0)
O2 Saturation: 83 %
PCO2 VEN: 52.5 mmHg — AB (ref 45.0–50.0)
PH VEN: 7.379 — AB (ref 7.250–7.300)
PO2 VEN: 49 mmHg — AB (ref 31.0–45.0)
TCO2: 33 mmol/L (ref 0–100)

## 2015-12-27 LAB — URINE MICROSCOPIC-ADD ON: RBC / HPF: NONE SEEN RBC/hpf (ref 0–5)

## 2015-12-27 MED ORDER — SODIUM CHLORIDE 0.9 % IV BOLUS (SEPSIS)
1000.0000 mL | Freq: Once | INTRAVENOUS | Status: AC
  Administered 2015-12-27: 1000 mL via INTRAVENOUS

## 2015-12-27 NOTE — ED Notes (Signed)
Per [facility]. Pt states that he does not feel good. Pt CBG is 391 with EMS. 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 Per EMS R leg sore is the worse below the knee and looks infected.   BP 112/90 100 HR 16 R 100% RA  Pt ambulatory to the room and to the bathroom. Urine sample provided by the pt.

## 2015-12-27 NOTE — ED Notes (Signed)
CBG was 270, RN notified

## 2015-12-27 NOTE — ED Provider Notes (Signed)
CSN: [id number]     Arrival date & time 12/27/15  1946 History   First MD Initiated Contact with Patient 12/27/15 1959     Chief Complaint  Patient presents with  . Hyperglycemia   HPI  Wayne [name] is a 40 year old male with past medical history of poorly controlled diabetes, medication noncompliance, hypertension and polysubstance abuse and bipolar disorder presenting with hyperglycemia. He states that he began feeling poorly earlier today and checked his blood sugar which was elevated to 391. He complains of polyuria, nausea and abdominal pain. He denies vomiting or diarrhea. He is also concerned about wounds on his right lower leg. He states they have been present for over two weeks and he has been seen in the emergency department for them before. He states that he met with a case manager and was supposed to follow up with a wound clinic but has not done so yet. He admits that on 5/20 he was prescribed antibiotics for his wound but never took them. He states the wound has become more painful and the skin around it appears swollen. He denies purulent drainage from the wound. The wound has not grown in size. He states that he does not take his insulin and only takes his metformin occasionally. Denies fevers, chills, headache, dizziness, syncope, chest pain, shortness of breath or extremity weakness/numbness.  Past Medical History  Diagnosis Date  . Depression   . Diabetes mellitus   . Hyperlipidemia   . HTN (hypertension)   . Chronic headaches   . Osteoarthritis   . Kidney stones   . Polysubstance abuse     cocaine, marijuana-quit summer 2010  . Tobacco abuse   . Asthma   . Chest pain   . Lumbar disc disease   . GERD (gastroesophageal reflux disease)   . OSA (obstructive sleep apnea)   . Peripheral neuropathy ([place]) 02/01/2011  . Bipolar 1 disorder ([place])   . Ingrown nail 11/12/2013   Past Surgical History  Procedure Laterality Date  . Knee surgery    . Bunionectomy  06-2009   Family  History  Problem Relation Age of Onset  . Heart disease Mother   . Diabetes Father   . Hyperlipidemia Father   . Emphysema Maternal Uncle   . Asthma Sister   . Asthma Mother   . Stroke Mother   . Hypertension Father   . Alzheimer's disease Maternal Grandmother   . Depression Father   . Breast cancer Maternal Aunt   . COPD Mother     maternal un   Social History  Substance Use Topics  . Smoking status: Current Some Day Smoker -- 0.20 packs/day for 17 years    Types: Cigarettes    Last Attempt to Quit: 02/24/2011  . Smokeless tobacco: Former Systems developer  . Alcohol Use: No     Comment: socially    Review of Systems  All other systems reviewed and are negative.     Allergies  Nystatin; Statins; Iodine; and Shellfish-derived products  Home Medications   Prior to Admission medications   Medication Sig Start Date End Date Taking? Authorizing Provider  albuterol (PROVENTIL HFA;VENTOLIN HFA) 108 (90 Base) MCG/ACT inhaler Inhale 2 puffs into the lungs every 6 (six) hours as needed for wheezing or shortness of breath.    Historical Provider, MD  aspirin EC 81 MG tablet Take 81 mg by mouth daily.    Historical Provider, MD  budesonide-formoterol (SYMBICORT) 80-4.5 MCG/ACT inhaler Inhale 2 puffs into the lungs 2 (two)  times daily.    Historical Provider, MD  buPROPion (WELLBUTRIN XL) 300 MG 24 hr tablet Take 300 mg by mouth daily. Reported on 12/08/2015    Historical Provider, MD  cephALEXin (KEFLEX) 500 MG capsule Take 1 capsule (500 mg total) by mouth 4 (four) times daily. Patient not taking: Reported on 12/17/2015 12/09/15   Carlisle Cater, PA-C  cholecalciferol (VITAMIN D) 1000 units tablet Take 1,000 Units by mouth daily.    Historical Provider, MD  ezetimibe (ZETIA) 10 MG tablet Take 10 mg by mouth daily.    Historical Provider, MD  gabapentin (NEURONTIN) 300 MG capsule Take 300 mg by mouth 3 (three) times daily.     Historical Provider, MD  glucosamine-chondroitin 500-400 MG tablet  Take 1 tablet by mouth daily.    Historical Provider, MD  loratadine (CLARITIN) 10 MG tablet Take 10 mg by mouth daily as needed for allergies.    Historical Provider, MD  losartan (COZAAR) 25 MG tablet Take 25 mg by mouth daily.    Historical Provider, MD  meloxicam (MOBIC) 15 MG tablet Take 15 mg by mouth daily.    Historical Provider, MD  metFORMIN (GLUCOPHAGE) 1000 MG tablet Take 1 tablet (1,000 mg total) by mouth 2 (two) times daily with a meal. 12/13/15   Harvel Quale, MD  methocarbamol (ROBAXIN) 500 MG tablet Take 500 mg by mouth every 8 (eight) hours as needed for muscle spasms.    Historical Provider, MD  omeprazole (PRILOSEC) 40 MG capsule Take 40 mg by mouth daily.    Historical Provider, MD  sertraline (ZOLOFT) 100 MG tablet Take 100 mg by mouth at bedtime.    Historical Provider, MD  traMADol (ULTRAM) 50 MG tablet Take 50 mg by mouth every 6 (six) hours as needed for moderate pain or severe pain (pain).     Historical Provider, MD  vitamin B-12 (CYANOCOBALAMIN) 1000 MCG tablet Take 1,000 mcg by mouth daily. Reported on 12/08/2015    Historical Provider, MD   BP 119/88 mmHg  Pulse 91  Temp(Src) 98 F (36.7 C) (Oral)  Resp 18  SpO2 100% Physical Exam  Constitutional: He appears well-developed and well-nourished. No distress.  HENT:  Head: Normocephalic and atraumatic.  Mouth/Throat: Uvula is midline. Mucous membranes are dry. No oropharyngeal exudate, posterior oropharyngeal edema or posterior oropharyngeal erythema.  Eyes: Conjunctivae are normal. Right eye exhibits no discharge. Left eye exhibits no discharge. No scleral icterus.  Neck: Normal range of motion. Neck supple.  Cardiovascular: Normal rate, regular rhythm and normal heart sounds.   Pulmonary/Chest: Effort normal and breath sounds normal. No respiratory distress. He has no wheezes. He has no rales.  Abdominal: Soft. Bowel sounds are normal. He exhibits no distension. There is no tenderness. There is no rebound and  no guarding.  Musculoskeletal: Normal range of motion.  Neurological: He is alert. Coordination normal.  Skin: Skin is warm and dry.  2 cm ulceration noted to left pre-tibial distal right leg. Blackened eschar noted. Small amount of straw colored serous drainage on palpation. No purulent drainage. No surrounding erythema, warmth, induration or swelling. Compared to picture in patient's chart from 5/24, there appears to be no change in the ulcer.   Psychiatric: He has a normal mood and affect. His behavior is normal.  Nursing note and vitals reviewed.   ED Course  Procedures (including critical care time) Labs Review Labs Reviewed  BASIC METABOLIC PANEL - Abnormal; Notable for the following:    Glucose, Bld 360 (*)  All other components within normal limits  URINALYSIS, ROUTINE W REFLEX MICROSCOPIC (NOT AT [facility]) - Abnormal; Notable for the following:    Specific Gravity, Urine 1.045 (*)    Glucose, UA >1000 (*)    Ketones, ur 15 (*)    All other components within normal limits  URINE MICROSCOPIC-ADD ON - Abnormal; Notable for the following:    Squamous Epithelial / LPF 0-5 (*)    Bacteria, UA RARE (*)    All other components within normal limits  CBG MONITORING, ED - Abnormal; Notable for the following:    Glucose-Capillary 354 (*)    All other components within normal limits  I-STAT VENOUS BLOOD GAS, ED - Abnormal; Notable for the following:    pH, Ven 7.379 (*)    pCO2, Ven 52.5 (*)    pO2, Ven 49.0 (*)    Bicarbonate 31.0 (*)    Acid-Base Excess 5.0 (*)    All other components within normal limits  CBG MONITORING, ED - Abnormal; Notable for the following:    Glucose-Capillary 270 (*)    All other components within normal limits  CBG MONITORING, ED - Abnormal; Notable for the following:    Glucose-Capillary 312 (*)    All other components within normal limits  CBC  BLOOD GAS, VENOUS    Imaging Review Dg Tibia/fibula Right  12/27/2015  CLINICAL DATA:  Right lower leg  pain and diabetic ulcers. EXAM: RIGHT TIBIA AND FIBULA - 2 VIEW COMPARISON:  None. FINDINGS: There is no evidence of acute fracture, subluxation, dislocation or radiographic evidence of osteomyelitis. Mild soft tissue swelling is noted. No focal bony lesions are identified. IMPRESSION: Mild soft tissue swelling without bony abnormality. Electronically Signed   By: Margarette Canada M.D.   On: 12/27/2015 21:04   I have personally reviewed and evaluated these images and lab results as part of my medical decision-making.   EKG Interpretation None      MDM   Final diagnoses:  Hyperglycemia  H/O medication noncompliance  Ulcer of right lower leg, with unspecified severity ([place])   40 year old male presenting with hyperglycemia and for a wound check. Noncompliant with his diabetes medications. Afebrile and hemodynamically stable. Mucous membranes appear dry. Heart regular rate and rhythm. Lungs clear to auscultation bilaterally. Abdomen is soft, nontender without peritoneal signs. A 2 cm ulceration is noted to the right lower tibia. When compared with pictures from one week ago, the wound has no changes and does not appear to be acutely infected. Right lower leg is neurovascularly intact. Patient has been instructed multiple times to follow-up with wound care which he has not done. No leukocytosis. Glucose noted to be 360. No anion gap. Patient is not acidotic. X-ray of right tibia does not show concern for osteomyelitis. Fluid bolus given which reduced blood sugar to 270. Patient has been sleeping comfortably in emergency department and has no acute complaints at this time. His blood sugar was noted to rise slightly to 312 upon checking for discharge. Will give small bolus prior to discharge. Reviewed chart and patient appears to be chronically noncompliant with his diabetes medication. Long discussion with patient about the importance of her PCP follow-up and wound care clinic follow-up. Discussed with patient  that he needs to get his blood sugar under control and followed by a wound nurse to heal properly. Discussed potential side effects of untreated wounds in diabetics including loss of limb. Patient states understanding. Referral for wound care again.  At this time there does  not appear to be any evidence of an acute emergency medical condition and the patient appears stable for discharge with appropriate outpatient follow up. Diagnosis was discussed with patient who verbalizes understanding and is agreeable to discharge. Return precautions given in discharge paperwork and discussed with pt at bedside. Pt is stable for discharge.     Lahoma Crocker Mignonne Afonso, PA-C 12/28/15 [id number]  Merrily Pew, MD 12/28/15 1159

## 2015-12-27 NOTE — ED Notes (Signed)
CBG was 354. RN notified.

## 2015-12-28 MED ORDER — SODIUM CHLORIDE 0.9 % IV BOLUS (SEPSIS)
500.0000 mL | Freq: Once | INTRAVENOUS | Status: AC
  Administered 2015-12-28: 500 mL via INTRAVENOUS

## 2015-12-28 NOTE — Discharge Instructions (Signed)
Follow up with the wound care clinic as soon as possible. Follow up with your PCP for a blood sugar recheck. Take your metformin as prescribed and discuss getting back on insulin with your PCP. Return to ED with new, worsening or concerning symptoms.    Hyperglycemia Hyperglycemia occurs when the glucose (sugar) in your blood is too high. Hyperglycemia can happen for many reasons, but it most often happens to people who do not know they have diabetes or are not managing their diabetes properly.  CAUSES  Whether you have diabetes or not, there are other causes of hyperglycemia. Hyperglycemia can occur when you have diabetes, but it can also occur in other situations that you might not be as aware of, such as: Diabetes  If you have diabetes and are having problems controlling your blood glucose, hyperglycemia could occur because of some of the following reasons:  Not following your meal plan.  Not taking your diabetes medications or not taking it properly.  Exercising less or doing less activity than you normally do.  Being sick. Pre-diabetes  This cannot be ignored. Before people develop Type 2 diabetes, they almost always have "pre-diabetes." This is when your blood glucose levels are higher than normal, but not yet high enough to be diagnosed as diabetes. Research has shown that some long-term damage to the body, especially the heart and circulatory system, may already be occurring during pre-diabetes. If you take action to manage your blood glucose when you have pre-diabetes, you may delay or prevent Type 2 diabetes from developing. Stress  If you have diabetes, you may be "diet" controlled or on oral medications or insulin to control your diabetes. However, you may find that your blood glucose is higher than usual in the hospital whether you have diabetes or not. This is often referred to as "stress hyperglycemia." Stress can elevate your blood glucose. This happens because of hormones put  out by the body during times of stress. If stress has been the cause of your high blood glucose, it can be followed regularly by your caregiver. That way he/she can make sure your hyperglycemia does not continue to get worse or progress to diabetes. Steroids  Steroids are medications that act on the infection fighting system (immune system) to block inflammation or infection. One side effect can be a rise in blood glucose. Most people can produce enough extra insulin to allow for this rise, but for those who cannot, steroids make blood glucose levels go even higher. It is not unusual for steroid treatments to "uncover" diabetes that is developing. It is not always possible to determine if the hyperglycemia will go away after the steroids are stopped. A special blood test called an A1c is sometimes done to determine if your blood glucose was elevated before the steroids were started. SYMPTOMS  Thirsty.  Frequent urination.  Dry mouth.  Blurred vision.  Tired or fatigue.  Weakness.  Sleepy.  Tingling in feet or leg. DIAGNOSIS  Diagnosis is made by monitoring blood glucose in one or all of the following ways:  A1c test. This is a chemical found in your blood.  Fingerstick blood glucose monitoring.  Laboratory results. TREATMENT  First, knowing the cause of the hyperglycemia is important before the hyperglycemia can be treated. Treatment may include, but is not be limited to:  Education.  Change or adjustment in medications.  Change or adjustment in meal plan.  Treatment for an illness, infection, etc.  More frequent blood glucose monitoring.  Change in  exercise plan.  Decreasing or stopping steroids.  Lifestyle changes. HOME CARE INSTRUCTIONS   Test your blood glucose as directed.  Exercise regularly. Your caregiver will give you instructions about exercise. Pre-diabetes or diabetes which comes on with stress is helped by exercising.  Eat wholesome, balanced meals.  Eat often and at regular, fixed times. Your caregiver or nutritionist will give you a meal plan to guide your sugar intake.  Being at an ideal weight is important. If needed, losing as little as 10 to 15 pounds may help improve blood glucose levels. SEEK MEDICAL CARE IF:   You have questions about medicine, activity, or diet.  You continue to have symptoms (problems such as increased thirst, urination, or weight gain). SEEK IMMEDIATE MEDICAL CARE IF:   You are vomiting or have diarrhea.  Your breath smells fruity.  You are breathing faster or slower.  You are very sleepy or incoherent.  You have numbness, tingling, or pain in your feet or hands.  You have chest pain.  Your symptoms get worse even though you have been following your caregiver's orders.  If you have any other questions or concerns.   This information is not intended to replace advice given to you by your health care provider. Make sure you discuss any questions you have with your health care provider.   Document Released: 01/05/2001 Document Revised: 10/04/2011 Document Reviewed: 03/18/2015 Elsevier Interactive Patient Education 2016 [employer] Wound Care Taking care of your wound properly can help to prevent pain and infection. It can also help your wound to heal more quickly.  HOW TO CARE FOR YOUR WOUND  Take or apply over-the-counter and prescription medicines only as told by your health care provider.  If you were prescribed antibiotic medicine, take or apply it as told by your health care provider. Do not stop using the antibiotic even if your condition improves.  Clean the wound each day or as told by your health care provider.  Wash the wound with mild soap and water.  Rinse the wound with water to remove all soap.  Pat the wound dry with a clean towel. Do not rub it.  There are many different ways to close and cover a wound. For example, a wound can be covered with stitches (sutures), skin  glue, or adhesive strips. Follow instructions from your health care provider about:  How to take care of your wound.  When and how you should change your bandage (dressing).  When you should remove your dressing.  Removing whatever was used to close your wound.  Check your wound every day for signs of infection. Watch for:  Redness, swelling, or pain.  Fluid, blood, or pus.  Keep the dressing dry until your health care provider says it can be removed. Do not take baths, swim, use a hot tub, or do anything that would put your wound underwater until your health care provider approves.  Raise (elevate) the injured area above the level of your heart while you are sitting or lying down.  Do not scratch or pick at the wound.  Keep all follow-up visits as told by your health care provider. This is important. SEEK MEDICAL CARE IF:  You received a tetanus shot and you have swelling, severe pain, redness, or bleeding at the injection site.  You have a fever.  Your pain is not controlled with medicine.  You have increased redness, swelling, or pain at the site of your wound.  You have fluid, blood, or pus  coming from your wound.  You notice a bad smell coming from your wound or your dressing. SEEK IMMEDIATE MEDICAL CARE IF:  You have a red streak going away from your wound.   This information is not intended to replace advice given to you by your health care provider. Make sure you discuss any questions you have with your health care provider.   Document Released: 04/20/2008 Document Revised: 11/26/2014 Document Reviewed: 07/08/2014 Elsevier Interactive Patient Education [employer]

## 2016-01-03 ENCOUNTER — Encounter (HOSPITAL_COMMUNITY): Payer: Self-pay | Admitting: Emergency Medicine

## 2016-01-03 ENCOUNTER — Emergency Department (HOSPITAL_COMMUNITY)
Admission: EM | Admit: 2016-01-03 | Discharge: 2016-01-04 | Disposition: A | Discharge status: Home / Self Care | Payer: BLUE CROSS/BLUE SHIELD | Attending: Emergency Medicine | Admitting: Emergency Medicine

## 2016-01-03 DIAGNOSIS — I10 Essential (primary) hypertension: Secondary | ICD-10-CM | POA: Diagnosis not present

## 2016-01-03 DIAGNOSIS — Z7982 Long term (current) use of aspirin: Secondary | ICD-10-CM | POA: Diagnosis not present

## 2016-01-03 DIAGNOSIS — Z79899 Other long term (current) drug therapy: Secondary | ICD-10-CM | POA: Insufficient documentation

## 2016-01-03 DIAGNOSIS — R739 Hyperglycemia, unspecified: Secondary | ICD-10-CM

## 2016-01-03 DIAGNOSIS — E785 Hyperlipidemia, unspecified: Secondary | ICD-10-CM | POA: Insufficient documentation

## 2016-01-03 DIAGNOSIS — F17210 Nicotine dependence, cigarettes, uncomplicated: Secondary | ICD-10-CM | POA: Diagnosis not present

## 2016-01-03 DIAGNOSIS — E1165 Type 2 diabetes mellitus with hyperglycemia: Secondary | ICD-10-CM | POA: Diagnosis not present

## 2016-01-03 DIAGNOSIS — J45909 Unspecified asthma, uncomplicated: Secondary | ICD-10-CM | POA: Insufficient documentation

## 2016-01-03 DIAGNOSIS — Z7984 Long term (current) use of oral hypoglycemic drugs: Secondary | ICD-10-CM | POA: Insufficient documentation

## 2016-01-03 DIAGNOSIS — F319 Bipolar disorder, unspecified: Secondary | ICD-10-CM | POA: Diagnosis not present

## 2016-01-03 LAB — BASIC METABOLIC PANEL
Anion gap: 6 (ref 5–15)
BUN: 9 mg/dL (ref 6–20)
CALCIUM: 9.2 mg/dL (ref 8.9–10.3)
CHLORIDE: 101 mmol/L (ref 101–111)
CO2: 29 mmol/L (ref 22–32)
CREATININE: 0.9 mg/dL (ref 0.61–1.24)
GFR calc Af Amer: >60 mL/min (ref >60)
GFR calc non Af Amer: >60 mL/min (ref >60)
GLUCOSE: 321 mg/dL — AB (ref 65–99)
Potassium: 4 mmol/L (ref 3.5–5.1)
Sodium: 136 mmol/L (ref 135–145)

## 2016-01-03 LAB — CBC
HCT: 35.6 % — ABNORMAL LOW (ref 39.0–52.0)
Hemoglobin: 12 g/dL — ABNORMAL LOW (ref 13.0–17.0)
MCH: 30.5 pg (ref 26.0–34.0)
MCHC: 33.7 g/dL (ref 30.0–36.0)
MCV: 90.4 fL (ref 78.0–100.0)
PLATELETS: 137 10*3/uL — AB (ref 150–400)
RBC: 3.94 MIL/uL — ABNORMAL LOW (ref 4.22–5.81)
RDW: 13 % (ref 11.5–15.5)
WBC: 4.1 10*3/uL (ref 4.0–10.5)

## 2016-01-03 LAB — CBG MONITORING, ED: Glucose-Capillary: 348 mg/dL — ABNORMAL HIGH (ref 65–99)

## 2016-01-03 MED ORDER — INSULIN ASPART 100 UNIT/ML ~~LOC~~ SOLN
6.0000 [IU] | Freq: Once | SUBCUTANEOUS | Status: AC
  Administered 2016-01-03: 6 [IU] via SUBCUTANEOUS
  Filled 2016-01-03: qty 1

## 2016-01-03 MED ORDER — SODIUM CHLORIDE 0.9 % IV BOLUS (SEPSIS)
1000.0000 mL | Freq: Once | INTRAVENOUS | Status: AC
  Administered 2016-01-03: 1000 mL via INTRAVENOUS

## 2016-01-03 NOTE — ED Provider Notes (Signed)
CSN: [id number]     Arrival date & time 01/03/16  2120 History   First MD Initiated Contact with Patient 01/03/16 2136     Chief Complaint  Patient presents with  . Hyperglycemia    HPI   40 year old male with a history of type 2 diabetes presents today with hyperglycemia. Patient reports that his friend Barb[employer]ena[employer] Niece this evening which had his insulin in the [place]. He is unable to take his medication, noted that his blood sugar was running high. At time of evaluation patient reports he's feeling sleepy, denies any fever, chills, nausea, vomiting, denies any abdominal pain, lower extremity swelling or edema. He denies any changes in the color clarity or characteristics of his urine. Reports that his friend will be coming home with Ativan this evening. That his blood sugar usually runs in the mid 100s with the use of insulin at home. 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 No signs of surrounding infection, he denies any discharge.  Chart review shows patient has reported that he actually does not take his insulin, and only takes metformin occasionally.   Past Medical History  Diagnosis Date  . Depression   . Diabetes mellitus   . Hyperlipidemia   . HTN (hypertension)   . Chronic headaches   . Osteoarthritis   . Kidney stones   . Polysubstance abuse     cocaine, marijuana-quit summer 2010  . Tobacco abuse   . Asthma   . Chest pain   . Lumbar disc disease   . GERD (gastroesophageal reflux disease)   . OSA (obstructive sleep apnea)   . Peripheral neuropathy (HCC) 02/01/2011  . Bipolar 1 disorder (HCC)   . Ingrown nail 11/12/2013   Past Surgical History  Procedure Laterality Date  . Knee surgery    . Bunionectomy  06-2009   Family History  Problem Relation Age of Onset  . Heart disease Mother   . Diabetes Father   . Hyperlipidemia Father   . Emphysema Maternal Uncle   . Asthma Sister   . Asthma Mother   . Stroke  Mother   . Hypertension Father   . Alzheimer's disease Maternal Grandmother   . Depression Father   . Breast cancer Maternal Aunt   . COPD Mother     maternal un   Social History  Substance Use Topics  . Smoking status: Current Some Day Smoker -- 0.20 packs/day for 17 years    Types: Cigarettes    Last Attempt to Quit: 02/24/2011  . Smokeless tobacco: Former Neurosurgeon  . Alcohol Use: No     Comment: socially    Review of Systems  All other systems reviewed and are negative.   Allergies  Nystatin; Statins; Iodine; and Shellfish-derived products  Home Medications   Prior to Admission medications   Medication Sig Start Date End Date Taking? Authorizing Provider  albuterol (PROVENTIL HFA;VENTOLIN HFA) 108 (90 Base) MCG/ACT inhaler Inhale 2 puffs into the lungs every 6 (six) hours as needed for wheezing or shortness of breath.   Yes Historical Provider, MD  aspirin EC 81 MG tablet Take 81 mg by mouth daily.   Yes Historical Provider, MD  budesonide-formoterol (SYMBICORT) 80-4.5 MCG/ACT inhaler Inhale 2 puffs into the lungs 2 (two) times daily.   Yes Historical Provider, MD  buPROPion (WELLBUTRIN XL) 300 MG 24 hr tablet Take 300 mg by mouth daily. Reported on 12/08/2015   Yes Historical Provider, MD  cholecalciferol (VITAMIN D) 1000 units tablet Take 1,000 Units by mouth daily.   Yes Historical Provider, MD  ezetimibe (ZETIA) 10 MG tablet Take 10 mg by mouth daily.   Yes Historical Provider, MD  gabapentin (NEURONTIN) 300 MG capsule Take 300 mg by mouth 3 (three) times daily.    Yes Historical Provider, MD  glucosamine-chondroitin 500-400 MG tablet Take 1 tablet by mouth daily.   Yes Historical Provider, MD  loratadine (CLARITIN) 10 MG tablet Take 10 mg by mouth daily as needed for allergies.   Yes Historical Provider, MD  losartan (COZAAR) 25 MG tablet Take 25 mg by mouth daily.   Yes Historical Provider, MD  meloxicam (MOBIC) 15 MG tablet Take 15 mg by mouth daily.   Yes Historical  Provider, MD  metFORMIN (GLUCOPHAGE) 1000 MG tablet Take 1 tablet (1,000 mg total) by mouth 2 (two) times daily with a meal. 12/13/15  Yes Leta Baptist, MD  methocarbamol (ROBAXIN) 500 MG tablet Take 500 mg by mouth every 8 (eight) hours as needed for muscle spasms.   Yes Historical Provider, MD  omeprazole (PRILOSEC) 40 MG capsule Take 40 mg by mouth daily.   Yes Historical Provider, MD  sertraline (ZOLOFT) 100 MG tablet Take 100 mg by mouth at bedtime.   Yes Historical Provider, MD  traMADol (ULTRAM) 50 MG tablet Take 50 mg by mouth every 6 (six) hours as needed for moderate pain or severe pain (pain).    Yes Historical Provider, MD  vitamin B-12 (CYANOCOBALAMIN) 1000 MCG tablet Take 1,000 mcg by mouth daily. Reported on 12/08/2015   Yes Historical Provider, MD   BP 103/75 mmHg  Pulse 87  Temp(Src) 97.7 F (36.5 C) (Oral)  Resp 20  Ht 6\' 1"  (1.854 m)  Wt 68.04 kg  BMI 19.79 kg/m2  SpO2 100%   Physical Exam  Constitutional: He is oriented to person, place, and time. He appears well-developed and well-nourished.  HENT:  Head: Normocephalic and atraumatic.  Eyes: Conjunctivae are normal. Pupils are equal, round, and reactive to light. Right eye exhibits no discharge. Left eye exhibits no discharge. No scleral icterus.  Neck: Normal range of motion. No JVD present. No tracheal deviation present.  Pulmonary/Chest: Effort normal. No stridor.  Abdominal: Soft. He exhibits no distension and no mass. There is no tenderness. There is no rebound and no guarding.  Musculoskeletal:  Chronic appearing wound to left anterior shin. Dry, no surrounding redness, discharge, warmth to touch  Neurological: He is alert and oriented to person, place, and time. Coordination normal.  Psychiatric: He has a normal mood and affect. His behavior is normal. Judgment and thought content normal.  Nursing note and vitals reviewed.   ED Course  Procedures (including critical care time) Labs Review Labs  Reviewed  BASIC METABOLIC PANEL - Abnormal; Notable for the following:    Glucose, Bld 321 (*)    All other components within normal limits  CBC - Abnormal; Notable for the following:    RBC 3.94 (*)    Hemoglobin 12.0 (*)    HCT 35.6 (*)    Platelets 137 (*)    All other components within normal limits  CBG MONITORING, ED - Abnormal; Notable for the following:    Glucose-Capillary 348 (*)    All other components within normal limits  CBG MONITORING, ED - Abnormal; Notable for the following:    Glucose-Capillary 180 (*)    All other components within normal limits  CBG MONITORING, ED  Imaging Review No results found. I have personally reviewed and evaluated these images and lab results as part of my medical decision-making.   EKG Interpretation None      MDM   Final diagnoses:  Hyperglycemia    Labs:BMP, CBC, glucose 348  Imaging:  Consults:  Therapeutics: Insulin regular  Discharge Meds:   Assessment/Plan: 40 year old male presents today with complaints of hyperglycemia. Patient has no signs of DKA or hyperosmolar state. Patient's blood sugar persistently elevated throughout all ED evaluation. I question if he is truly taking his insulin at home. Patient given a dose of insulin, normal saline, with reduction in his blood sugar. Patient will be discharged home with instructions to use his home medications as needed, follow up with his primary care. Strict return precautions given.         Eyvonne MechanicJeffrey Arly Salminen, PA-C 01/05/16 [id number]  Loren Raceravid Yelverton, MD 01/06/16 1929

## 2016-01-03 NOTE — ED Notes (Signed)
[facility] with c/o hyperglycemia. Reports being out of his medicines x 1 day, and has been feeling "lethargic and tired all day". EMS reports blood glucose of 330 on scene. Pt a/o x 4, appropriate to this RN's questions. EMS reports pt has h/o the same (running out of meds with no money to fill them).

## 2016-01-04 LAB — CBG MONITORING, ED: Glucose-Capillary: 180 mg/dL — ABNORMAL HIGH (ref 65–99)

## 2016-01-04 NOTE — Discharge Instructions (Signed)
Hyperglycemia °Hyperglycemia occurs when the glucose (sugar) in your blood is too high. Hyperglycemia can happen for many reasons, but it most often happens to people who do not know they have diabetes or are not managing their diabetes properly.  °CAUSES  °Whether you have diabetes or not, there are other causes of hyperglycemia. Hyperglycemia can occur when you have diabetes, but it can also occur in other situations that you might not be as aware of, such as: °Diabetes °· If you have diabetes and are having problems controlling your blood glucose, hyperglycemia could occur because of some of the following reasons: °¨ Not following your meal plan. °¨ Not taking your diabetes medications or not taking it properly. °¨ Exercising less or doing less activity than you normally do. °¨ Being sick. °Pre-diabetes °· This cannot be ignored. Before people develop Type 2 diabetes, they almost always have "pre-diabetes." This is when your blood glucose levels are higher than normal, but not yet high enough to be diagnosed as diabetes. Research has shown that some long-term damage to the body, especially the heart and circulatory system, may already be occurring during pre-diabetes. If you take action to manage your blood glucose when you have pre-diabetes, you may delay or prevent Type 2 diabetes from developing. °Stress °· If you have diabetes, you may be "diet" controlled or on oral medications or insulin to control your diabetes. However, you may find that your blood glucose is higher than usual in the hospital whether you have diabetes or not. This is often referred to as "stress hyperglycemia." Stress can elevate your blood glucose. This happens because of hormones put out by the body during times of stress. If stress has been the cause of your high blood glucose, it can be followed regularly by your caregiver. That way he/she can make sure your hyperglycemia does not continue to get worse or progress to  diabetes. °Steroids °· Steroids are medications that act on the infection fighting system (immune system) to block inflammation or infection. One side effect can be a rise in blood glucose. Most people can produce enough extra insulin to allow for this rise, but for those who cannot, steroids make blood glucose levels go even higher. It is not unusual for steroid treatments to "uncover" diabetes that is developing. It is not always possible to determine if the hyperglycemia will go away after the steroids are stopped. A special blood test called an A1c is sometimes done to determine if your blood glucose was elevated before the steroids were started. °SYMPTOMS °· Thirsty. °· Frequent urination. °· Dry mouth. °· Blurred vision. °· Tired or fatigue. °· Weakness. °· Sleepy. °· Tingling in feet or leg. °DIAGNOSIS  °Diagnosis is made by monitoring blood glucose in one or all of the following ways: °· A1c test. This is a chemical found in your blood. °· Fingerstick blood glucose monitoring. °· Laboratory results. °TREATMENT  °First, knowing the cause of the hyperglycemia is important before the hyperglycemia can be treated. Treatment may include, but is not be limited to: °· Education. °· Change or adjustment in medications. °· Change or adjustment in meal plan. °· Treatment for an illness, infection, etc. °· More frequent blood glucose monitoring. °· Change in exercise plan. °· Decreasing or stopping steroids. °· Lifestyle changes. °HOME CARE INSTRUCTIONS  °· Test your blood glucose as directed. °· Exercise regularly. Your caregiver will give you instructions about exercise. Pre-diabetes or diabetes which comes on with stress is helped by exercising. °· Eat wholesome,   balanced meals. Eat often and at regular, fixed times. Your caregiver or nutritionist will give you a meal plan to guide your sugar intake. °· Being at an ideal weight is important. If needed, losing as little as 10 to 15 pounds may help improve blood  glucose levels. °SEEK MEDICAL CARE IF:  °· You have questions about medicine, activity, or diet. °· You continue to have symptoms (problems such as increased thirst, urination, or weight gain). °SEEK IMMEDIATE MEDICAL CARE IF:  °· You are vomiting or have diarrhea. °· Your breath smells fruity. °· You are breathing faster or slower. °· You are very sleepy or incoherent. °· You have numbness, tingling, or pain in your feet or hands. °· You have chest pain. °· Your symptoms get worse even though you have been following your caregiver's orders. °· If you have any other questions or concerns. °  °This information is not intended to replace advice given to you by your health care provider. Make sure you discuss any questions you have with your health care provider. °  °Document Released: 01/05/2001 Document Revised: 10/04/2011 Document Reviewed: 03/18/2015 °Elsevier Interactive Patient Education ©2016 [employer] ° °

## 2016-01-08 ENCOUNTER — Emergency Department (HOSPITAL_COMMUNITY)
Admission: EM | Admit: 2016-01-08 | Discharge: 2016-01-09 | Disposition: A | Discharge status: Other Acute Inpatient Hospital | Payer: BLUE CROSS/BLUE SHIELD | Attending: Emergency Medicine | Admitting: Emergency Medicine

## 2016-01-08 ENCOUNTER — Encounter (HOSPITAL_COMMUNITY): Payer: Self-pay

## 2016-01-08 DIAGNOSIS — Z7984 Long term (current) use of oral hypoglycemic drugs: Secondary | ICD-10-CM | POA: Insufficient documentation

## 2016-01-08 DIAGNOSIS — F1424 Cocaine dependence with cocaine-induced mood disorder: Secondary | ICD-10-CM | POA: Diagnosis not present

## 2016-01-08 DIAGNOSIS — F315 Bipolar disorder, current episode depressed, severe, with psychotic features: Secondary | ICD-10-CM | POA: Insufficient documentation

## 2016-01-08 DIAGNOSIS — I10 Essential (primary) hypertension: Secondary | ICD-10-CM | POA: Diagnosis not present

## 2016-01-08 DIAGNOSIS — F17210 Nicotine dependence, cigarettes, uncomplicated: Secondary | ICD-10-CM | POA: Insufficient documentation

## 2016-01-08 DIAGNOSIS — E1165 Type 2 diabetes mellitus with hyperglycemia: Secondary | ICD-10-CM | POA: Insufficient documentation

## 2016-01-08 DIAGNOSIS — R739 Hyperglycemia, unspecified: Secondary | ICD-10-CM

## 2016-01-08 DIAGNOSIS — R45851 Suicidal ideations: Secondary | ICD-10-CM | POA: Diagnosis present

## 2016-01-08 DIAGNOSIS — E785 Hyperlipidemia, unspecified: Secondary | ICD-10-CM | POA: Insufficient documentation

## 2016-01-08 LAB — BLOOD GAS, VENOUS
Acid-base deficit: 0.4 mmol/L (ref 0.0–2.0)
Bicarbonate: 25 mEq/L — ABNORMAL HIGH (ref 20.0–24.0)
FIO2: 0.21
O2 Saturation: 92.6 %
Patient temperature: 98.6
TCO2: 22.8 mmol/L (ref 0–100)
pCO2, Ven: 46.8 mmHg (ref 45.0–50.0)
pH, Ven: 7.348 — ABNORMAL HIGH (ref 7.250–7.300)
pO2, Ven: 65.7 mmHg — ABNORMAL HIGH (ref 31.0–45.0)

## 2016-01-08 LAB — COMPREHENSIVE METABOLIC PANEL
ALBUMIN: 3.6 g/dL (ref 3.5–5.0)
ALT: 14 U/L — ABNORMAL LOW (ref 17–63)
AST: 26 U/L (ref 15–41)
Alkaline Phosphatase: 91 U/L (ref 38–126)
Anion gap: 12 (ref 5–15)
BILIRUBIN TOTAL: 0.6 mg/dL (ref 0.3–1.2)
BUN: 13 mg/dL (ref 6–20)
CHLORIDE: 100 mmol/L — AB (ref 101–111)
CO2: 22 mmol/L (ref 22–32)
Calcium: 8.8 mg/dL — ABNORMAL LOW (ref 8.9–10.3)
Creatinine, Ser: 1.25 mg/dL — ABNORMAL HIGH (ref 0.61–1.24)
GFR calc Af Amer: >60 mL/min (ref >60)
GFR calc non Af Amer: >60 mL/min (ref >60)
GLUCOSE: 713 mg/dL — AB (ref 65–99)
POTASSIUM: 3.6 mmol/L (ref 3.5–5.1)
Sodium: 134 mmol/L — ABNORMAL LOW (ref 135–145)
TOTAL PROTEIN: 6.8 g/dL (ref 6.5–8.1)

## 2016-01-08 LAB — RAPID URINE DRUG SCREEN, HOSP PERFORMED
AMPHETAMINES: NOT DETECTED
BARBITURATES: NOT DETECTED
BENZODIAZEPINES: NOT DETECTED
Cocaine: POSITIVE — AB
Opiates: NOT DETECTED
TETRAHYDROCANNABINOL: NOT DETECTED

## 2016-01-08 LAB — URINALYSIS, ROUTINE W REFLEX MICROSCOPIC
Bilirubin Urine: NEGATIVE
Hgb urine dipstick: NEGATIVE
Ketones, ur: NEGATIVE mg/dL
LEUKOCYTES UA: NEGATIVE
Nitrite: NEGATIVE
PH: 5.5 (ref 5.0–8.0)
Protein, ur: NEGATIVE mg/dL
SPECIFIC GRAVITY, URINE: 1.041 — AB (ref 1.005–1.030)

## 2016-01-08 LAB — ACETAMINOPHEN LEVEL

## 2016-01-08 LAB — ETHANOL: Alcohol, Ethyl (B): <5 mg/dL (ref <5)

## 2016-01-08 LAB — LIPASE, BLOOD: LIPASE: 20 U/L (ref 11–51)

## 2016-01-08 LAB — CBC
HCT: 35.3 % — ABNORMAL LOW (ref 39.0–52.0)
Hemoglobin: 12.2 g/dL — ABNORMAL LOW (ref 13.0–17.0)
MCH: 31.7 pg (ref 26.0–34.0)
MCHC: 34.6 g/dL (ref 30.0–36.0)
MCV: 91.7 fL (ref 78.0–100.0)
Platelets: 150 10*3/uL (ref 150–400)
RBC: 3.85 MIL/uL — ABNORMAL LOW (ref 4.22–5.81)
RDW: 13.1 % (ref 11.5–15.5)
WBC: 4.7 10*3/uL (ref 4.0–10.5)

## 2016-01-08 LAB — URINE MICROSCOPIC-ADD ON
RBC / HPF: NONE SEEN RBC/hpf (ref 0–5)
Squamous Epithelial / HPF: NONE SEEN

## 2016-01-08 LAB — CBG MONITORING, ED: Glucose-Capillary: 577 mg/dL (ref 65–99)

## 2016-01-08 LAB — SALICYLATE LEVEL: Salicylate Lvl: <4 mg/dL (ref 2.8–30.0)

## 2016-01-08 LAB — MAGNESIUM: Magnesium: 1.8 mg/dL (ref 1.7–2.4)

## 2016-01-08 MED ORDER — SODIUM CHLORIDE 0.9 % IV SOLN
INTRAVENOUS | Status: DC
  Administered 2016-01-09: 5 [IU]/h via INTRAVENOUS
  Filled 2016-01-08: qty 2.5

## 2016-01-08 MED ORDER — SODIUM CHLORIDE 0.9 % IV BOLUS (SEPSIS): 1000.0000 mL | Freq: Once | INTRAVENOUS | Status: DC

## 2016-01-08 MED ORDER — DEXTROSE-NACL 5-0.45 % IV SOLN
INTRAVENOUS | Status: DC
  Administered 2016-01-09: 03:00:00 via INTRAVENOUS

## 2016-01-08 NOTE — ED Notes (Signed)
Bed: WA25 Expected date:  Expected time:  Means of arrival:  Comments: EMS - hyperglycemia 

## 2016-01-08 NOTE — ED Provider Notes (Signed)
CSN: [id number]     Arrival date & time 01/08/16  2152 History   First MD Initiated Contact with Patient 01/08/16 2237     Chief Complaint  Patient presents with  . Hyperglycemia  . Suicidal     (Consider location/radiation/quality/duration/timing/severity/associated sxs/prior Treatment) HPI   Wayne [name] is a 40 y.o. male, with a history of DM, HTN, and bipolar, presenting to the ED with malaise since around 8 pm this evening. Poorly controlled diabetes. Supposed to take insulin, but has not taken any medication in about 10 days. Endorses polydipsia and polyuria. Pt also complains of SI stating, "I just don't want to live anymore." Denies having a plan. Denies regular alcohol use. Denies HI or A/V hallucinations. Patient denies past suicide attempts. Patient denies nausea/vomiting, fever/chills, shortness of breath, chest pain, acute pain, or any other complaints.    Past Medical History  Diagnosis Date  . Depression   . Diabetes mellitus   . Hyperlipidemia   . HTN (hypertension)   . Chronic headaches   . Osteoarthritis   . Kidney stones   . Polysubstance abuse     cocaine, marijuana-quit summer 2010  . Tobacco abuse   . Asthma   . Chest pain   . Lumbar disc disease   . GERD (gastroesophageal reflux disease)   . OSA (obstructive sleep apnea)   . Peripheral neuropathy (HCC) 02/01/2011  . Bipolar 1 disorder (HCC)   . Ingrown nail 11/12/2013   Past Surgical History  Procedure Laterality Date  . Knee surgery    . Bunionectomy  06-2009   Family History  Problem Relation Age of Onset  . Heart disease Mother   . Diabetes Father   . Hyperlipidemia Father   . Emphysema Maternal Uncle   . Asthma Sister   . Asthma Mother   . Stroke Mother   . Hypertension Father   . Alzheimer's disease Maternal Grandmother   . Depression Father   . Breast cancer Maternal Aunt   . COPD Mother     maternal un   Social History  Substance Use Topics  . Smoking status: Current Some Day  Smoker -- 0.20 packs/day for 17 years    Types: Cigarettes    Last Attempt to Quit: 02/24/2011  . Smokeless tobacco: Former Neurosurgeon  . Alcohol Use: No     Comment: socially    Review of Systems  Constitutional: Positive for fatigue. Negative for fever and chills.  Respiratory: Negative for shortness of breath.   Cardiovascular: Negative for chest pain.  Gastrointestinal: Negative for nausea, vomiting and abdominal pain.  Endocrine: Positive for polydipsia and polyuria.  Skin: Negative for color change and pallor.  Neurological: Negative for dizziness, syncope, weakness, light-headedness, numbness and headaches.  Psychiatric/Behavioral: Positive for suicidal ideas.  All other systems reviewed and are negative.     Allergies  Nystatin; Statins; Iodine; and Shellfish-derived products  Home Medications   Prior to Admission medications   Medication Sig Start Date End Date Taking? Authorizing Provider  albuterol (PROVENTIL HFA;VENTOLIN HFA) 108 (90 Base) MCG/ACT inhaler Inhale 2 puffs into the lungs every 6 (six) hours as needed for wheezing or shortness of breath.    Historical Provider, MD  aspirin EC 81 MG tablet Take 81 mg by mouth daily.    Historical Provider, MD  budesonide-formoterol (SYMBICORT) 80-4.5 MCG/ACT inhaler Inhale 2 puffs into the lungs 2 (two) times daily.    Historical Provider, MD  buPROPion (WELLBUTRIN XL) 300 MG 24 hr tablet Take  300 mg by mouth daily. Reported on 12/08/2015    Historical Provider, MD  cholecalciferol (VITAMIN D) 1000 units tablet Take 1,000 Units by mouth daily.    Historical Provider, MD  ezetimibe (ZETIA) 10 MG tablet Take 10 mg by mouth daily.    Historical Provider, MD  gabapentin (NEURONTIN) 300 MG capsule Take 300 mg by mouth 3 (three) times daily.     Historical Provider, MD  glucosamine-chondroitin 500-400 MG tablet Take 1 tablet by mouth daily.    Historical Provider, MD  loratadine (CLARITIN) 10 MG tablet Take 10 mg by mouth daily as  needed for allergies.    Historical Provider, MD  losartan (COZAAR) 25 MG tablet Take 25 mg by mouth daily.    Historical Provider, MD  meloxicam (MOBIC) 15 MG tablet Take 15 mg by mouth daily.    Historical Provider, MD  metFORMIN (GLUCOPHAGE) 1000 MG tablet Take 1 tablet (1,000 mg total) by mouth 2 (two) times daily with a meal. 12/13/15   Leta BaptistEmily Roe Nguyen, MD  methocarbamol (ROBAXIN) 500 MG tablet Take 500 mg by mouth every 8 (eight) hours as needed for muscle spasms.    Historical Provider, MD  omeprazole (PRILOSEC) 40 MG capsule Take 40 mg by mouth daily.    Historical Provider, MD  sertraline (ZOLOFT) 100 MG tablet Take 100 mg by mouth at bedtime.    Historical Provider, MD  traMADol (ULTRAM) 50 MG tablet Take 50 mg by mouth every 6 (six) hours as needed for moderate pain or severe pain (pain).     Historical Provider, MD  vitamin B-12 (CYANOCOBALAMIN) 1000 MCG tablet Take 1,000 mcg by mouth daily. Reported on 12/08/2015    Historical Provider, MD   BP 108/80 mmHg  Pulse 95  Temp(Src) 98.5 F (36.9 C) (Oral)  Resp 20  Ht 6\' 1"  (1.854 m)  Wt 68.04 kg  BMI 19.79 kg/m2  SpO2 100% Physical Exam  Constitutional: He is oriented to person, place, and time. He appears well-developed and well-nourished. No distress.  HENT:  Head: Normocephalic and atraumatic.  Eyes: Conjunctivae are normal.  Neck: Neck supple.  Cardiovascular: Normal rate, regular rhythm, normal heart sounds and intact distal pulses.   Pulmonary/Chest: Effort normal and breath sounds normal. No respiratory distress.  Abdominal: Soft. There is no tenderness. There is no guarding.  Musculoskeletal: He exhibits no edema or tenderness.  Lymphadenopathy:    He has no cervical adenopathy.  Neurological: He is alert and oriented to person, place, and time. He has normal reflexes.  Skin: Skin is warm and dry. He is not diaphoretic.  Psychiatric: His speech is normal and behavior is normal. He exhibits a depressed mood.  Nursing  note and vitals reviewed.   ED Course  Procedures (including critical care time) Labs Review Labs Reviewed  CBC - Abnormal; Notable for the following:    RBC 3.85 (*)    Hemoglobin 12.2 (*)    HCT 35.3 (*)    All other components within normal limits  COMPREHENSIVE METABOLIC PANEL - Abnormal; Notable for the following:    Sodium 134 (*)    Chloride 100 (*)    Glucose, Bld 713 (*)    Creatinine, Ser 1.25 (*)    Calcium 8.8 (*)    ALT 14 (*)    All other components within normal limits  URINALYSIS, ROUTINE W REFLEX MICROSCOPIC (NOT AT [facility]) - Abnormal; Notable for the following:    Specific Gravity, Urine 1.041 (*)    Glucose, UA >  1000 (*)    All other components within normal limits  URINE RAPID DRUG SCREEN, HOSP PERFORMED - Abnormal; Notable for the following:    Cocaine POSITIVE (*)    All other components within normal limits  ACETAMINOPHEN LEVEL - Abnormal; Notable for the following:    Acetaminophen (Tylenol), Serum <10 (*)    All other components within normal limits  BLOOD GAS, VENOUS - Abnormal; Notable for the following:    pH, Ven 7.348 (*)    pO2, Ven 65.7 (*)    Bicarbonate 25.0 (*)    All other components within normal limits  URINE MICROSCOPIC-ADD ON - Abnormal; Notable for the following:    Bacteria, UA RARE (*)    All other components within normal limits  CBG MONITORING, ED - Abnormal; Notable for the following:    Glucose-Capillary 577 (*)    All other components within normal limits  CBG MONITORING, ED - Abnormal; Notable for the following:    Glucose-Capillary 563 (*)    All other components within normal limits  I-STAT CHEM 8, ED - Abnormal; Notable for the following:    Potassium 3.3 (*)    Glucose, Bld 370 (*)    Hemoglobin 12.9 (*)    HCT 38.0 (*)    All other components within normal limits  CBG MONITORING, ED - Abnormal; Notable for the following:    Glucose-Capillary 487 (*)    All other components within normal limits  CBG MONITORING, ED  - Abnormal; Notable for the following:    Glucose-Capillary 281 (*)    All other components within normal limits  CBG MONITORING, ED - Abnormal; Notable for the following:    Glucose-Capillary 191 (*)    All other components within normal limits  ETHANOL  SALICYLATE LEVEL  LIPASE, BLOOD  MAGNESIUM   BUN  Date Value Ref Range Status  01/09/2016 11 6 - 20 mg/dL Final  16/04/9603 13 6 - 20 mg/dL Final  54/03/8118 9 6 - 20 mg/dL Final  14/78/2956 7 6 - 20 mg/dL Final   CREATININE, SER  Date Value Ref Range Status  01/09/2016 0.80 0.61 - 1.24 mg/dL Final  21/30/8657 8.46* 0.61 - 1.24 mg/dL Final  96/29/5284 1.32 0.61 - 1.24 mg/dL Final  44/07/270 5.36 0.61 - 1.24 mg/dL Final      Imaging Review No results found. I have personally reviewed and evaluated these lab results as part of my medical decision-making.   EKG Interpretation None      MDM   Final diagnoses:  Hyperglycemia  Suicidal ideation    Wayne [name] presents with hyperglycemia and suicidal ideations.  Findings and plan of care discussed with Dr. Blinda Leatherwood after EDP shift change.  Patient will be put on suicide watch precautions while he is being treated for his hyperglycemia. Hyperglycemia without anion gap acidosis. Normal potassium. Upon reassessment, patient states his malaise has improved. Glucose is responding to treatments. TTS consult placed once patient's blood glucose was at a more manageable level. Laquesta from TTS states patient meets inpatient criteria. Patient medically cleared and placed in psych hold.    Filed Vitals:   01/08/16 2210 01/09/16 0000 01/09/16 0030 01/09/16 0100  BP: 108/80 108/87 108/82 112/83  Pulse: 95 92 88 87  Temp: 98.5 F (36.9 C)     TempSrc: Oral     Resp: Height:  (1.854 m)     Weight: 68.04 kg     SpO2: 100% 99% 94%  98%   Filed Vitals:   01/09/16 0100 01/09/16 0130 01/09/16 0200 01/09/16 0230  BP: 112/83 116/92 112/87 118/91  Pulse: 87 88  87 89  Temp:      TempSrc:      Resp: 18 22 15 12   Height:      Weight:      SpO2: 98% 100% 100% 100%   Filed Vitals:   01/09/16 0200 01/09/16 0230 01/09/16 0300 01/09/16 0330  BP: 112/87 118/91 121/89 118/94  Pulse: 87 89 87 90  Temp:      TempSrc:      Resp: 15 12 13 13   Height:      Weight:      SpO2: 100% 100% 100% 100%     Anselm Pancoast, PA-C 01/09/16 0418  Jacalyn Lefevre, MD 01/12/16 [phone]

## 2016-01-08 NOTE — ED Notes (Signed)
PT RECEIVED VIA EMS FOR HYPERGLYCEMIA AND LETHARGY. CBG OF 567. PT ALSO STATES HE IS SUICIDAL. "I JUST DON'T WANT TO LIVE ANYMORE."

## 2016-01-09 ENCOUNTER — Encounter (HOSPITAL_COMMUNITY): Payer: Self-pay | Admitting: *Deleted

## 2016-01-09 ENCOUNTER — Observation Stay (HOSPITAL_COMMUNITY)
Admission: AD | Admit: 2016-01-09 | Discharge: 2016-01-11 | Disposition: A | Discharge status: Home / Self Care | Payer: BLUE CROSS/BLUE SHIELD | Source: Intra-hospital | Attending: Psychiatry | Admitting: Psychiatry

## 2016-01-09 DIAGNOSIS — F17210 Nicotine dependence, cigarettes, uncomplicated: Secondary | ICD-10-CM | POA: Insufficient documentation

## 2016-01-09 DIAGNOSIS — E538 Deficiency of other specified B group vitamins: Secondary | ICD-10-CM | POA: Insufficient documentation

## 2016-01-09 DIAGNOSIS — G4733 Obstructive sleep apnea (adult) (pediatric): Secondary | ICD-10-CM | POA: Insufficient documentation

## 2016-01-09 DIAGNOSIS — R45851 Suicidal ideations: Secondary | ICD-10-CM | POA: Diagnosis present

## 2016-01-09 DIAGNOSIS — E1142 Type 2 diabetes mellitus with diabetic polyneuropathy: Secondary | ICD-10-CM | POA: Diagnosis not present

## 2016-01-09 DIAGNOSIS — J45909 Unspecified asthma, uncomplicated: Secondary | ICD-10-CM | POA: Insufficient documentation

## 2016-01-09 DIAGNOSIS — E43 Unspecified severe protein-calorie malnutrition: Secondary | ICD-10-CM | POA: Insufficient documentation

## 2016-01-09 DIAGNOSIS — N529 Male erectile dysfunction, unspecified: Secondary | ICD-10-CM | POA: Insufficient documentation

## 2016-01-09 DIAGNOSIS — I10 Essential (primary) hypertension: Secondary | ICD-10-CM | POA: Insufficient documentation

## 2016-01-09 DIAGNOSIS — Z6820 Body mass index (BMI) 20.0-20.9, adult: Secondary | ICD-10-CM | POA: Insufficient documentation

## 2016-01-09 DIAGNOSIS — Z7984 Long term (current) use of oral hypoglycemic drugs: Secondary | ICD-10-CM | POA: Diagnosis not present

## 2016-01-09 DIAGNOSIS — M1990 Unspecified osteoarthritis, unspecified site: Secondary | ICD-10-CM | POA: Diagnosis not present

## 2016-01-09 DIAGNOSIS — Z87442 Personal history of urinary calculi: Secondary | ICD-10-CM | POA: Diagnosis not present

## 2016-01-09 DIAGNOSIS — Z9114 Patient's other noncompliance with medication regimen: Secondary | ICD-10-CM | POA: Diagnosis not present

## 2016-01-09 DIAGNOSIS — F1424 Cocaine dependence with cocaine-induced mood disorder: Secondary | ICD-10-CM

## 2016-01-09 DIAGNOSIS — E519 Thiamine deficiency, unspecified: Secondary | ICD-10-CM | POA: Diagnosis not present

## 2016-01-09 DIAGNOSIS — K219 Gastro-esophageal reflux disease without esophagitis: Secondary | ICD-10-CM | POA: Insufficient documentation

## 2016-01-09 DIAGNOSIS — F314 Bipolar disorder, current episode depressed, severe, without psychotic features: Secondary | ICD-10-CM | POA: Insufficient documentation

## 2016-01-09 DIAGNOSIS — Z79899 Other long term (current) drug therapy: Secondary | ICD-10-CM | POA: Diagnosis not present

## 2016-01-09 DIAGNOSIS — E785 Hyperlipidemia, unspecified: Secondary | ICD-10-CM | POA: Insufficient documentation

## 2016-01-09 LAB — CBG MONITORING, ED
GLUCOSE-CAPILLARY: 487 mg/dL — AB (ref 65–99)
GLUCOSE-CAPILLARY: 162 mg/dL — AB (ref 65–99)
Glucose-Capillary: 146 mg/dL — ABNORMAL HIGH (ref 65–99)
Glucose-Capillary: 191 mg/dL — ABNORMAL HIGH (ref 65–99)
Glucose-Capillary: 232 mg/dL — ABNORMAL HIGH (ref 65–99)
Glucose-Capillary: 281 mg/dL — ABNORMAL HIGH (ref 65–99)
Glucose-Capillary: 563 mg/dL (ref 65–99)

## 2016-01-09 LAB — GLUCOSE, CAPILLARY
GLUCOSE-CAPILLARY: 338 mg/dL — AB (ref 65–99)
GLUCOSE-CAPILLARY: 189 mg/dL — AB (ref 65–99)
Glucose-Capillary: 140 mg/dL — ABNORMAL HIGH (ref 65–99)

## 2016-01-09 LAB — I-STAT CHEM 8, ED
BUN: 11 mg/dL (ref 6–20)
CALCIUM ION: 1.18 mmol/L (ref 1.12–1.23)
CHLORIDE: 105 mmol/L (ref 101–111)
Creatinine, Ser: 0.8 mg/dL (ref 0.61–1.24)
GLUCOSE: 370 mg/dL — AB (ref 65–99)
HCT: 38 % — ABNORMAL LOW (ref 39.0–52.0)
Hemoglobin: 12.9 g/dL — ABNORMAL LOW (ref 13.0–17.0)
Potassium: 3.3 mmol/L — ABNORMAL LOW (ref 3.5–5.1)
Sodium: 143 mmol/L (ref 135–145)
TCO2: 27 mmol/L (ref 0–100)

## 2016-01-09 MED ORDER — ACETAMINOPHEN 325 MG PO TABS: 650.0000 mg | ORAL_TABLET | Freq: Four times a day (QID) | ORAL | Status: DC | PRN

## 2016-01-09 MED ORDER — BUPROPION HCL ER (XL) 300 MG PO TB24
300.0000 mg | ORAL_TABLET | Freq: Every day | ORAL | Status: DC
  Administered 2016-01-10 – 2016-01-11 (×2): 300 mg via ORAL
  Filled 2016-01-09 (×2): qty 1

## 2016-01-09 MED ORDER — IBUPROFEN 200 MG PO TABS
600.0000 mg | ORAL_TABLET | Freq: Three times a day (TID) | ORAL | Status: DC | PRN
  Administered 2016-01-09: 600 mg via ORAL
  Filled 2016-01-09: qty 3

## 2016-01-09 MED ORDER — GABAPENTIN 300 MG PO CAPS
300.0000 mg | ORAL_CAPSULE | Freq: Three times a day (TID) | ORAL | Status: DC
  Administered 2016-01-09 – 2016-01-11 (×7): 300 mg via ORAL
  Filled 2016-01-09 (×6): qty 1

## 2016-01-09 MED ORDER — INSULIN ASPART 100 UNIT/ML ~~LOC~~ SOLN: 3.0000 [IU] | Freq: Three times a day (TID) | SUBCUTANEOUS | Status: DC

## 2016-01-09 MED ORDER — VITAMIN D3 25 MCG (1000 UNIT) PO TABS: 1000.0000 [IU] | ORAL_TABLET | Freq: Every day | ORAL | Status: DC

## 2016-01-09 MED ORDER — MELOXICAM 15 MG PO TABS: 15.0000 mg | ORAL_TABLET | Freq: Every day | ORAL | Status: DC

## 2016-01-09 MED ORDER — METFORMIN HCL 500 MG PO TABS: 1000.0000 mg | ORAL_TABLET | Freq: Two times a day (BID) | ORAL | Status: DC

## 2016-01-09 MED ORDER — METHOCARBAMOL 500 MG PO TABS
500.0000 mg | ORAL_TABLET | Freq: Three times a day (TID) | ORAL | Status: DC | PRN
  Administered 2016-01-10 – 2016-01-11 (×3): 500 mg via ORAL
  Filled 2016-01-09 (×3): qty 1

## 2016-01-09 MED ORDER — ACETAMINOPHEN 325 MG PO TABS: 650.0000 mg | ORAL_TABLET | ORAL | Status: DC | PRN

## 2016-01-09 MED ORDER — SERTRALINE HCL 100 MG PO TABS
100.0000 mg | ORAL_TABLET | Freq: Every day | ORAL | Status: DC
  Administered 2016-01-09 – 2016-01-10 (×2): 100 mg via ORAL
  Filled 2016-01-09 (×2): qty 1

## 2016-01-09 MED ORDER — POTASSIUM CHLORIDE CRYS ER 20 MEQ PO TBCR
20.0000 meq | EXTENDED_RELEASE_TABLET | Freq: Once | ORAL | Status: AC
  Administered 2016-01-09: 20 meq via ORAL
  Filled 2016-01-09: qty 1

## 2016-01-09 MED ORDER — LOSARTAN POTASSIUM 25 MG PO TABS
25.0000 mg | ORAL_TABLET | Freq: Every day | ORAL | Status: DC
  Administered 2016-01-10 – 2016-01-11 (×2): 25 mg via ORAL
  Filled 2016-01-09 (×2): qty 1

## 2016-01-09 MED ORDER — ONDANSETRON HCL 4 MG PO TABS: 4.0000 mg | ORAL_TABLET | Freq: Three times a day (TID) | ORAL | Status: DC | PRN

## 2016-01-09 MED ORDER — IBUPROFEN 600 MG PO TABS: 600.0000 mg | ORAL_TABLET | Freq: Three times a day (TID) | ORAL | Status: DC | PRN

## 2016-01-09 MED ORDER — PANTOPRAZOLE SODIUM 40 MG PO TBEC
40.0000 mg | DELAYED_RELEASE_TABLET | Freq: Every day | ORAL | Status: DC
  Administered 2016-01-10 – 2016-01-11 (×2): 40 mg via ORAL
  Filled 2016-01-09 (×2): qty 1

## 2016-01-09 MED ORDER — INSULIN ASPART 100 UNIT/ML ~~LOC~~ SOLN
0.0000 [IU] | Freq: Every day | SUBCUTANEOUS | Status: DC
  Administered 2016-01-10: 3 [IU] via SUBCUTANEOUS

## 2016-01-09 MED ORDER — SERTRALINE HCL 50 MG PO TABS: 100.0000 mg | ORAL_TABLET | Freq: Every day | ORAL | Status: DC

## 2016-01-09 MED ORDER — LORAZEPAM 1 MG PO TABS: 1.0000 mg | ORAL_TABLET | Freq: Three times a day (TID) | ORAL | Status: DC | PRN

## 2016-01-09 MED ORDER — MELOXICAM 7.5 MG PO TABS
15.0000 mg | ORAL_TABLET | Freq: Every day | ORAL | Status: DC
  Administered 2016-01-10 – 2016-01-11 (×2): 15 mg via ORAL
  Filled 2016-01-09 (×2): qty 2

## 2016-01-09 MED ORDER — INSULIN ASPART 100 UNIT/ML ~~LOC~~ SOLN: 0.0000 [IU] | Freq: Three times a day (TID) | SUBCUTANEOUS | Status: DC

## 2016-01-09 MED ORDER — MAGNESIUM HYDROXIDE 400 MG/5ML PO SUSP: 30.0000 mL | Freq: Every day | ORAL | Status: DC | PRN

## 2016-01-09 MED ORDER — ZOLPIDEM TARTRATE 5 MG PO TABS
5.0000 mg | ORAL_TABLET | Freq: Every evening | ORAL | Status: DC | PRN
  Filled 2016-01-09: qty 1

## 2016-01-09 MED ORDER — ALUM & MAG HYDROXIDE-SIMETH 200-200-20 MG/5ML PO SUSP: 30.0000 mL | ORAL | Status: DC | PRN

## 2016-01-09 MED ORDER — METHOCARBAMOL 500 MG PO TABS: 500.0000 mg | ORAL_TABLET | Freq: Three times a day (TID) | ORAL | Status: DC | PRN

## 2016-01-09 MED ORDER — LOSARTAN POTASSIUM 25 MG PO TABS: 25.0000 mg | ORAL_TABLET | Freq: Every day | ORAL | Status: DC

## 2016-01-09 MED ORDER — INSULIN ASPART 100 UNIT/ML ~~LOC~~ SOLN
3.0000 [IU] | Freq: Three times a day (TID) | SUBCUTANEOUS | Status: DC
  Administered 2016-01-09 – 2016-01-10 (×4): 3 [IU] via SUBCUTANEOUS

## 2016-01-09 MED ORDER — EZETIMIBE 10 MG PO TABS
10.0000 mg | ORAL_TABLET | Freq: Every day | ORAL | Status: DC
  Administered 2016-01-10 – 2016-01-11 (×2): 10 mg via ORAL
  Filled 2016-01-09 (×3): qty 1

## 2016-01-09 MED ORDER — LOPERAMIDE HCL 2 MG PO CAPS
2.0000 mg | ORAL_CAPSULE | ORAL | Status: DC | PRN
  Administered 2016-01-10 – 2016-01-11 (×5): 2 mg via ORAL
  Filled 2016-01-09 (×5): qty 1

## 2016-01-09 MED ORDER — LORATADINE 10 MG PO TABS: 10.0000 mg | ORAL_TABLET | Freq: Every day | ORAL | Status: DC | PRN

## 2016-01-09 MED ORDER — MOMETASONE FURO-FORMOTEROL FUM 100-5 MCG/ACT IN AERO: 2.0000 | INHALATION_SPRAY | Freq: Two times a day (BID) | RESPIRATORY_TRACT | Status: DC

## 2016-01-09 MED ORDER — SODIUM CHLORIDE 0.9 % IV SOLN
1000.0000 mL | Freq: Once | INTRAVENOUS | Status: AC
  Administered 2016-01-09: 1000 mL via INTRAVENOUS

## 2016-01-09 MED ORDER — GABAPENTIN 300 MG PO CAPS: 300.0000 mg | ORAL_CAPSULE | Freq: Three times a day (TID) | ORAL | Status: DC

## 2016-01-09 MED ORDER — ASPIRIN EC 81 MG PO TBEC
81.0000 mg | DELAYED_RELEASE_TABLET | Freq: Every day | ORAL | Status: DC
  Administered 2016-01-10 – 2016-01-11 (×2): 81 mg via ORAL
  Filled 2016-01-09 (×2): qty 1

## 2016-01-09 MED ORDER — LORAZEPAM 1 MG PO TABS
0.0000 mg | ORAL_TABLET | Freq: Four times a day (QID) | ORAL | Status: DC
  Filled 2016-01-09: qty 1

## 2016-01-09 MED ORDER — LORAZEPAM 1 MG PO TABS: 0.0000 mg | ORAL_TABLET | Freq: Two times a day (BID) | ORAL | Status: DC

## 2016-01-09 MED ORDER — VITAMIN B-12 1000 MCG PO TABS
1000.0000 ug | ORAL_TABLET | Freq: Every day | ORAL | Status: DC
  Administered 2016-01-10 – 2016-01-11 (×2): 1000 ug via ORAL
  Filled 2016-01-09 (×3): qty 1

## 2016-01-09 MED ORDER — ZOLPIDEM TARTRATE 5 MG PO TABS: 5.0000 mg | ORAL_TABLET | Freq: Every evening | ORAL | Status: DC | PRN

## 2016-01-09 MED ORDER — VITAMIN D3 25 MCG (1000 UNIT) PO TABS
1000.0000 [IU] | ORAL_TABLET | Freq: Every day | ORAL | Status: DC
  Administered 2016-01-10 – 2016-01-11 (×2): 1000 [IU] via ORAL
  Filled 2016-01-09 (×3): qty 1

## 2016-01-09 MED ORDER — ACETAMINOPHEN 325 MG PO TABS
650.0000 mg | ORAL_TABLET | ORAL | Status: DC | PRN
Start: 2016-01-09 — End: 2016-01-09

## 2016-01-09 MED ORDER — THIAMINE HCL 100 MG/ML IJ SOLN
100.0000 mg | Freq: Every day | INTRAMUSCULAR | Status: DC
  Filled 2016-01-09: qty 2

## 2016-01-09 MED ORDER — THIAMINE HCL 100 MG/ML IJ SOLN: 100.0000 mg | Freq: Every day | INTRAMUSCULAR | Status: DC

## 2016-01-09 MED ORDER — NICOTINE 21 MG/24HR TD PT24: 21.0000 mg | MEDICATED_PATCH | Freq: Every day | TRANSDERMAL | Status: DC

## 2016-01-09 MED ORDER — EZETIMIBE 10 MG PO TABS: 10.0000 mg | ORAL_TABLET | Freq: Every day | ORAL | Status: DC

## 2016-01-09 MED ORDER — BUPROPION HCL ER (XL) 300 MG PO TB24: 300.0000 mg | ORAL_TABLET | Freq: Every day | ORAL | Status: DC

## 2016-01-09 MED ORDER — PANTOPRAZOLE SODIUM 40 MG PO TBEC: 40.0000 mg | DELAYED_RELEASE_TABLET | Freq: Every day | ORAL | Status: DC

## 2016-01-09 MED ORDER — METFORMIN HCL 500 MG PO TABS
1000.0000 mg | ORAL_TABLET | Freq: Two times a day (BID) | ORAL | Status: DC
  Administered 2016-01-09 – 2016-01-11 (×4): 1000 mg via ORAL
  Filled 2016-01-09 (×4): qty 2

## 2016-01-09 MED ORDER — ASPIRIN EC 81 MG PO TBEC: 81.0000 mg | DELAYED_RELEASE_TABLET | Freq: Every day | ORAL | Status: DC

## 2016-01-09 MED ORDER — NICOTINE 21 MG/24HR TD PT24
21.0000 mg | MEDICATED_PATCH | Freq: Every day | TRANSDERMAL | Status: DC
  Administered 2016-01-10 – 2016-01-11 (×2): 21 mg via TRANSDERMAL
  Filled 2016-01-09 (×2): qty 1

## 2016-01-09 MED ORDER — INSULIN ASPART 100 UNIT/ML ~~LOC~~ SOLN
0.0000 [IU] | Freq: Three times a day (TID) | SUBCUTANEOUS | Status: DC
  Administered 2016-01-09: 3 [IU] via SUBCUTANEOUS
  Administered 2016-01-09 – 2016-01-10 (×2): 11 [IU] via SUBCUTANEOUS
  Administered 2016-01-10: 2 [IU] via SUBCUTANEOUS
  Administered 2016-01-10: 5 [IU] via SUBCUTANEOUS
  Administered 2016-01-11: 3 [IU] via SUBCUTANEOUS
  Administered 2016-01-11: 11 [IU] via SUBCUTANEOUS

## 2016-01-09 MED ORDER — VITAMIN B-1 100 MG PO TABS: 100.0000 mg | ORAL_TABLET | Freq: Every day | ORAL | Status: DC

## 2016-01-09 MED ORDER — SODIUM CHLORIDE 0.9 % IV SOLN
1000.0000 mL | INTRAVENOUS | Status: DC
  Administered 2016-01-09: 1000 mL via INTRAVENOUS

## 2016-01-09 MED ORDER — VITAMIN B-12 1000 MCG PO TABS: 1000.0000 ug | ORAL_TABLET | Freq: Every day | ORAL | Status: DC

## 2016-01-09 MED ORDER — MOMETASONE FURO-FORMOTEROL FUM 100-5 MCG/ACT IN AERO
2.0000 | INHALATION_SPRAY | Freq: Two times a day (BID) | RESPIRATORY_TRACT | Status: DC
  Administered 2016-01-09 – 2016-01-11 (×4): 2 via RESPIRATORY_TRACT
  Filled 2016-01-09 (×2): qty 8.8

## 2016-01-09 NOTE — ED Notes (Signed)
Pt was asked to sign his belongings sheet, pt claimed to be missing 100 dollar bill that he said was in his checkbook. Staff asked pt so show where he left the 100 bills. Pt looked in checkbook ans said that it wasn't there anymore. Pt refused to sign belongings sheet because he said he is missing his money. Pt belongings documented: Shoes, socks, jeans, belt, lighter, [employer] Visa, Blue T-shirt, black hat, check book, cigarette (1), and NCDL.

## 2016-01-09 NOTE — ED Notes (Signed)
[facility] Expected date: 01/09/16 Expected time: 5:51 AM Means of arrival:  Comments: For [employer]

## 2016-01-09 NOTE — Progress Notes (Signed)
Inpatient Diabetes Program Recommendations  AACE/ADA: New Consensus Statement on Inpatient Glycemic Control (2015)  Target Ranges:  Prepandial:   less than 140 mg/dL      Peak postprandial:   less than 180 mg/dL (1-2 hours)      Critically ill patients:  140 - 180 mg/dL   Results for CLAIBORNE, STROBLE (MRN [id number]) as of 01/09/2016 10:35  Ref. Range 01/08/2016 22:22  Sodium Latest Ref Range: 135-145 mmol/L 134 (L)  Potassium Latest Ref Range: 3.5-5.1 mmol/L 3.6  Chloride Latest Ref Range: 101-111 mmol/L 100 (L)  CO2 Latest Ref Range: 22-32 mmol/L 22  BUN Latest Ref Range: 6-20 mg/dL 13  Creatinine Latest Ref Range: 0.61-1.24 mg/dL 1.25 (H)  Calcium Latest Ref Range: 8.9-10.3 mg/dL 8.8 (L)  EGFR (Non-African Amer.) Latest Ref Range: >60 mL/min >60  EGFR (African American) Latest Ref Range: >60 mL/min >60  Glucose Latest Ref Range: 65-99 mg/dL 713 (HH)  Anion gap Latest Ref Range: 5-15  12    Admit with: Hyperglycemia/ Suicidal   History: DM2  Home DM Meds: Metformin 1000 mg bid       Lantus 35 units daily (not taking)       Novolog 8 units tidwc (not taking)  Current Insulin Orders: Metformin 1000 mg bid     -IV Insulin drip stopped at 4:30am.  Started on home dose Metformin.  -Through Chart Review, discovered that patient is supposed to be taking Lantus and Novolog at home.  Per records, patient was discharged home from [facility] by Dr. Fritzi Mandes with [facility] for Lantus 35 units daily + Novolog 8 units tidwc.  Per ED notes, patient nonadherent to medication regimen at home.  Has not taken any medications for 10 days prior to arrival in ED.     MD- Please consider the following in-hospital insulin adjustments if patient has prolonged stay in the ED:  1. Start Lantus 20 units daily (0.3 units/kg dosing)  2. Start Novolog Moderate Correction Scale/ SSI (0-15 units) TID AC + HS     --Will follow patient during hospitalization--  Wyn Quaker RN, MSN,  CDE Diabetes Coordinator Inpatient Glycemic Control Team Team Pager: [phone] (8a-5p)
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
## 2016-01-09 NOTE — H&P (Signed)
[facility]  Patient Identification: Wayne [name] MRN: [id number] Principal Diagnosis: Cocaine dependence with cocaine-induced mood disorder [facility]) Diagnosis:  Patient Active Problem List   Diagnosis Date Noted  . Cocaine dependence with cocaine-induced mood disorder [facility]) [F14.24] 01/09/2016    Priority: High  . Protein-calorie malnutrition, severe [E43] 12/02/2015  . Diabetes mellitus with nonketotic hyperosmolarity ([place]) [E11.00] 11/30/2015  . Major depressive disorder, recurrent severe without psychotic features ([place]) [F33.2]   . Onychomycosis [B35.1] 01/04/2014  . Ingrown nail [L60.0] 11/12/2013  . Onychia and paronychia of toe [J17.915] 11/12/2013  . Deformity of metatarsal bone of right foot [M21.961] 08/17/2013  . Low TSH level [R94.6] 07/29/2011  . Peripheral neuropathy ([place]) [G62.9] 02/01/2011  . GERD [K21.9] 07/10/2010  . OBSTRUCTIVE SLEEP APNEA [G47.33] 09/26/2009  . IBS [K58.9] 06/10/2009  . SCIATICA, RIGHT [M54.30] 06/02/2009  . SUBSTANCE ABUSE, MULTIPLE [F19.10] 01/10/2009  . LUNG NODULE [J98.4] 12/04/2007  . HYPOGONADISM, MALE [E29.1] 10/09/2007  . ERECTILE DYSFUNCTION [F52.8] 09/14/2007  . DIABETES MELLITUS, TYPE II [E11.9] 04/12/2007  . HYPERLIPIDEMIA [E78.5] 04/12/2007  . Depression, major, recurrent ([place]) [F33.9] 04/12/2007  . Essential hypertension [I10] 04/12/2007  . OSTEOARTHRITIS [M19.90] 04/12/2007    Total Time spent with patient: 45 minutes  Subjective:  Wayne [name] is a 40 y.o. male patient admitted to [facility] Observation Unit.  HPI: 40 yo male who presented to the ED under the influence of cocaine and initially denied suicidal ideations. Today, he just wants to "sleep" as he is tired. He denies homicidal ideations, hallucinations, and withdrawal symptoms. He expresses passive suicidal ideations to "jump off a bridge or run out in front of a car". Mr. Tugwell is  under the care of [facility] team who has not been able to find him lately, notified them. Patient reports that he has been non-compliant with both his medical and psychiatric medications. The patient reports not having had any insulin for the past seven months. Patient was very difficult to engage in assessment for unclear reasons possibly fatigue. Patient states "I don't feel like living. I stole from my family so they have nothing to do with me. I use cocaine about four times per week. I've been homeless for several months. I only eat a few times per week. I'm depressed and suicidal."  Past Psychiatric History: depression, substance abuse  Risk to Self: Suicidal Ideation: Yes-Currently Present Suicidal Intent: No Is patient at risk for suicide?: Yes Suicidal Plan?: No Access to Means: No What has been your use of drugs/alcohol within the last 12 months?: cocaine use reported  How many times?: 2 Other Self Harm Risks: non-compliance with medication  Triggers for Past Attempts: Unpredictable Intentional Self Injurious Behavior: None Risk to Others: Homicidal Ideation: No Thoughts of Harm to Others: No Current Homicidal Intent: No Current Homicidal Plan: No Access to Homicidal Means: No Identified Victim: N/A History of harm to others?: No Assessment of Violence: None Noted Violent Behavior Description: No violent behaviors observed.  Does patient have access to weapons?: Yes (Comment) (Knives) Criminal Charges Pending?: Yes Describe Pending Criminal Charges: Larceny Does patient have a court date: Yes Court Date: 02/12/16 Prior Inpatient Therapy: Prior Inpatient Therapy: Yes Prior Therapy Dates: 2016 Prior Therapy Facilty/Provider(s): [facility] Reason for Treatment: depression Prior Outpatient Therapy: Prior Outpatient Therapy: Yes Prior Therapy Dates: current Prior Therapy Facilty/Provider(s): [facility] Reason for Treatment: medication management  Does patient have an ACCT team?:  Yes Does patient have Intensive In-House Services? : No Does patient have  [facility] services? : No Does patient have P4[facility] services?: No  Past Medical History:  Past Medical History  Diagnosis Date  . Depression   . Diabetes mellitus   . Hyperlipidemia   . HTN (hypertension)   . Chronic headaches   . Osteoarthritis   . Kidney stones   . Polysubstance abuse     cocaine, marijuana-quit summer 2010  . Tobacco abuse   . Asthma   . Chest pain   . Lumbar disc disease   . GERD (gastroesophageal reflux disease)   . OSA (obstructive sleep apnea)   . Peripheral neuropathy ([place]) 02/01/2011  . Bipolar 1 disorder ([place])   . Ingrown nail 11/12/2013    Past Surgical History  Procedure Laterality Date  . Knee surgery    . Bunionectomy  06-2009   Family History:  Family History  Problem Relation Age of Onset  . Heart disease Mother   . Diabetes Father   . Hyperlipidemia Father   . Emphysema Maternal Uncle   . Asthma Sister   . Asthma Mother   . Stroke Mother   . Hypertension Father   . Alzheimer's disease Maternal Grandmother   . Depression Father   . Breast cancer Maternal Aunt   . COPD Mother     maternal un   Family Psychiatric History: none Social History:  History  Alcohol Use No    Comment: socially    History  Drug Use  . Yes  . Special: Cocaine    Comment: former cocaine and use    Social History   Social History  . Marital Status: Single    Spouse Name: N/A  . Number of Children: 0  . Years of Education: N/A   Occupational History  . student and landscape    Social History Main Topics  . Smoking status: Current Some Day Smoker -- 0.20 packs/day for 17 years    Types: Cigarettes    Last Attempt to Quit: 02/24/2011  . Smokeless tobacco: Former Systems developer  . Alcohol Use: No      Comment: socially  . Drug Use: Yes    Special: Cocaine     Comment: former cocaine and use  . Sexual Activity: Not Asked   Other Topics Concern  . None   Social History Narrative   Additional Social History:    Allergies:  Allergies  Allergen Reactions  . Nystatin Other (See Comments)    Muscle weakness  . Statins Other (See Comments)    Muscle weakness, stomach cramps  . Iodine Rash  . Shellfish-Derived Products Rash    Labs:   Lab Results Last 48 Hours    Results for orders placed or performed during the hospital encounter of 01/08/16 (from the past 48 hour(s))  CBG monitoring, ED Status: Abnormal   Collection Time: 01/08/16 10:21 PM  Result Value Ref Range   Glucose-Capillary 577 (HH) 65 - 99 mg/dL   Comment 1 Notify RN   CBC Status: Abnormal   Collection Time: 01/08/16 10:22 PM  Result Value Ref Range   WBC 4.7 4.0 - 10.5 K/uL   RBC 3.85 (L) 4.22 - 5.81 MIL/uL   Hemoglobin 12.2 (L) 13.0 - 17.0 g/dL   HCT 35.3 (L) 39.0 - 52.0 %   MCV 91.7 78.0 - 100.0 fL   MCH 31.7 26.0 - 34.0 pg   MCHC 34.6 30.0 - 36.0 g/dL   RDW 13.1 11.5 - 15.5 %   Platelets 150 150 - 400 K/uL  Comprehensive metabolic  panel Status: Abnormal   Collection Time: 01/08/16 10:22 PM  Result Value Ref Range   Sodium 134 (L) 135 - 145 mmol/L   Potassium 3.6 3.5 - 5.1 mmol/L   Chloride 100 (L) 101 - 111 mmol/L   CO2 22 22 - 32 mmol/L   Glucose, Bld 713 (HH) 65 - 99 mg/dL    Comment: CRITICAL RESULT CALLED TO, READ BACK BY AND VERIFIED WITH: Erasmo Score [id number] @ 2304 BY J SCOTTON    BUN 13 6 - 20 mg/dL   Creatinine, Ser 1.25 (H) 0.61 - 1.24 mg/dL   Calcium 8.8 (L) 8.9 - 10.3 mg/dL   Total Protein 6.8 6.5 - 8.1 g/dL   Albumin 3.6 3.5 - 5.0 g/dL   AST 26 15 - 41 U/L   ALT 14 (L) 17 - 63 U/L   Alkaline  Phosphatase 91 38 - 126 U/L   Total Bilirubin 0.6 0.3 - 1.2 mg/dL   GFR calc non Af Amer >60 >60 mL/min   GFR calc Af Amer >60 >60 mL/min    Comment: (NOTE) The eGFR has been calculated using the CKD EPI equation. This calculation has not been validated in all clinical situations. eGFR's persistently <60 mL/min signify possible Chronic Kidney Disease.    Anion gap 12 5 - 15  Ethanol Status: None   Collection Time: 01/08/16 10:22 PM  Result Value Ref Range   Alcohol, Ethyl (B) <5 <5 mg/dL    Comment:   LOWEST DETECTABLE LIMIT FOR SERUM ALCOHOL IS 5 mg/dL FOR MEDICAL PURPOSES ONLY   Acetaminophen level Status: Abnormal   Collection Time: 01/08/16 10:22 PM  Result Value Ref Range   Acetaminophen (Tylenol), Serum <10 (L) 10 - 30 ug/mL    Comment:   THERAPEUTIC CONCENTRATIONS VARY SIGNIFICANTLY. A RANGE OF 10-30 ug/mL MAY BE AN EFFECTIVE CONCENTRATION FOR MANY PATIENTS. HOWEVER, SOME ARE BEST TREATED AT CONCENTRATIONS OUTSIDE THIS RANGE. ACETAMINOPHEN CONCENTRATIONS >150 ug/mL AT 4 HOURS AFTER INGESTION AND >50 ug/mL AT 12 HOURS AFTER INGESTION ARE OFTEN ASSOCIATED WITH TOXIC REACTIONS.   Salicylate level Status: None   Collection Time: 01/08/16 10:22 PM  Result Value Ref Range   Salicylate Lvl <6.6 2.8 - 30.0 mg/dL  Urinalysis, Routine w reflex microscopic (not at [facility]) Status: Abnormal   Collection Time: 01/08/16 11:06 PM  Result Value Ref Range   Color, Urine YELLOW YELLOW   APPearance CLEAR CLEAR   Specific Gravity, Urine 1.041 (H) 1.005 - 1.030   pH 5.5 5.0 - 8.0   Glucose, UA >1000 (A) NEGATIVE mg/dL   Hgb urine dipstick NEGATIVE NEGATIVE   Bilirubin Urine NEGATIVE NEGATIVE   Ketones, ur NEGATIVE NEGATIVE mg/dL   Protein, ur NEGATIVE NEGATIVE mg/dL   Nitrite NEGATIVE NEGATIVE   Leukocytes, UA NEGATIVE NEGATIVE  Urine rapid  drug screen (hosp performed) Status: Abnormal   Collection Time: 01/08/16 11:06 PM  Result Value Ref Range   Opiates NONE DETECTED NONE DETECTED   Cocaine POSITIVE (A) NONE DETECTED   Benzodiazepines NONE DETECTED NONE DETECTED   Amphetamines NONE DETECTED NONE DETECTED   Tetrahydrocannabinol NONE DETECTED NONE DETECTED   Barbiturates NONE DETECTED NONE DETECTED    Comment:   DRUG SCREEN FOR MEDICAL PURPOSES ONLY. IF CONFIRMATION IS NEEDED FOR ANY PURPOSE, NOTIFY LAB WITHIN 5 DAYS.   LOWEST DETECTABLE LIMITS FOR URINE DRUG SCREEN Drug Class Cutoff (ng/mL) Amphetamine 1000 Barbiturate 200 Benzodiazepine 294 Tricyclics 765 Opiates 465 Cocaine 300 THC 50   Urine microscopic-add on Status: Abnormal  Collection Time: 01/08/16 11:06 PM  Result Value Ref Range   Squamous Epithelial / LPF NONE SEEN NONE SEEN   WBC, UA 0-5 0 - 5 WBC/hpf   RBC / HPF NONE SEEN 0 - 5 RBC/hpf   Bacteria, UA RARE (A) NONE SEEN  Blood gas, venous Status: Abnormal   Collection Time: 01/08/16 11:12 PM  Result Value Ref Range   FIO2 0.21    Delivery systems ROOM AIR    pH, Ven 7.348 (H) 7.250 - 7.300   pCO2, Ven 46.8 45.0 - 50.0 mmHg   pO2, Ven 65.7 (H) 31.0 - 45.0 mmHg   Bicarbonate 25.0 (H) 20.0 - 24.0 mEq/L   TCO2 22.8 0 - 100 mmol/L   Acid-base deficit 0.4 0.0 - 2.0 mmol/L   O2 Saturation 92.6 %   Patient temperature 98.6    Collection site VEIN    Drawn by COLLECTED BY LABORATORY    Sample type VENOUS   Lipase, blood Status: None   Collection Time: 01/08/16 11:17 PM  Result Value Ref Range   Lipase 20 11 - 51 U/L  Magnesium Status: None   Collection Time: 01/08/16 11:17 PM  Result Value Ref Range   Magnesium 1.8 1.7 - 2.4 mg/dL  CBG monitoring, ED Status: Abnormal    Collection Time: 01/09/16 12:01 AM  Result Value Ref Range   Glucose-Capillary 563 (HH) 65 - 99 mg/dL   Comment 1 Notify RN    Comment 2 Document in Chart   CBG monitoring, ED Status: Abnormal   Collection Time: 01/09/16 1:09 AM  Result Value Ref Range   Glucose-Capillary 487 (H) 65 - 99 mg/dL  I-stat Chem 8, ED Status: Abnormal   Collection Time: 01/09/16 1:58 AM  Result Value Ref Range   Sodium 143 135 - 145 mmol/L   Potassium 3.3 (L) 3.5 - 5.1 mmol/L   Chloride 105 101 - 111 mmol/L   BUN 11 6 - 20 mg/dL   Creatinine, Ser 0.80 0.61 - 1.24 mg/dL   Glucose, Bld 370 (H) 65 - 99 mg/dL   Calcium, Ion 1.18 1.12 - 1.23 mmol/L   TCO2 27 0 - 100 mmol/L   Hemoglobin 12.9 (L) 13.0 - 17.0 g/dL   HCT 38.0 (L) 39.0 - 52.0 %  CBG monitoring, ED Status: Abnormal   Collection Time: 01/09/16 2:13 AM  Result Value Ref Range   Glucose-Capillary 281 (H) 65 - 99 mg/dL  CBG monitoring, ED Status: Abnormal   Collection Time: 01/09/16 3:19 AM  Result Value Ref Range   Glucose-Capillary 191 (H) 65 - 99 mg/dL  CBG monitoring, ED Status: Abnormal   Collection Time: 01/09/16 4:27 AM  Result Value Ref Range   Glucose-Capillary 146 (H) 65 - 99 mg/dL  CBG monitoring, ED Status: Abnormal   Collection Time: 01/09/16 5:17 AM  Result Value Ref Range   Glucose-Capillary 162 (H) 65 - 99 mg/dL  CBG monitoring, ED Status: Abnormal   Collection Time: 01/09/16 8:35 AM  Result Value Ref Range   Glucose-Capillary 232 (H) 65 - 99 mg/dL      Current Facility-Administered Medications  Medication Dose Route Frequency Provider Last Rate Last Dose  . 0.9 % sodium chloride infusion 1,000 mL Intravenous Continuous Shawn C Joy, PA-C  Stopped at 01/09/16 0510  . acetaminophen (TYLENOL) tablet 650 mg 650 mg Oral Q4H PRN Shawn C Joy,  PA-C    . alum & mag hydroxide-simeth (MAALOX/MYLANTA) 200-200-20 MG/5ML suspension 30 mL 30 mL Oral PRN Lorayne Bender,  PA-C    . aspirin EC tablet 81 mg 81 mg Oral Daily Patrecia Pour, NP    . buPROPion (WELLBUTRIN XL) 24 hr tablet 300 mg 300 mg Oral Daily Patrecia Pour, NP    . cholecalciferol (VITAMIN D) tablet 1,000 Units 1,000 Units Oral Daily Patrecia Pour, NP    . dextrose 5 %-0.45 % sodium chloride infusion  Intravenous Continuous Shawn C Joy, PA-C  Stopped at 01/09/16 0510  . ezetimibe (ZETIA) tablet 10 mg 10 mg Oral Daily Patrecia Pour, NP    . gabapentin (NEURONTIN) capsule 300 mg 300 mg Oral TID Patrecia Pour, NP    . ibuprofen (ADVIL,MOTRIN) tablet 600 mg 600 mg Oral Q8H PRN Shawn C Joy, PA-C  600 mg at 01/09/16 0626  . insulin regular (NOVOLIN R,HUMULIN R) 250 Units in sodium chloride 0.9 % 250 mL (1 Units/mL) infusion  Intravenous Continuous Lorayne Bender, PA-C  Stopped at 01/09/16 0509  . loratadine (CLARITIN) tablet 10 mg 10 mg Oral Daily PRN Patrecia Pour, NP    . losartan (COZAAR) tablet 25 mg 25 mg Oral Daily Patrecia Pour, NP    . meloxicam (MOBIC) tablet 15 mg 15 mg Oral Daily Patrecia Pour, NP    . metFORMIN (GLUCOPHAGE) tablet 1,000 mg 1,000 mg Oral BID WC Patrecia Pour, NP    . methocarbamol (ROBAXIN) tablet 500 mg 500 mg Oral Q8H PRN Patrecia Pour, NP    . mometasone-formoterol (DULERA) 100-5 MCG/ACT inhaler 2 puff 2 puff Inhalation BID Patrecia Pour, NP    . nicotine (NICODERM CQ - dosed in mg/24 hours) patch 21 mg 21 mg Transdermal Daily Shawn C Joy, PA-C    . ondansetron (ZOFRAN) tablet 4 mg 4 mg Oral Q8H PRN Shawn C Joy, PA-C    . pantoprazole (PROTONIX) EC tablet 40 mg 40 mg Oral Daily Patrecia Pour, NP    . sertraline (ZOLOFT) tablet 100 mg 100 mg Oral QHS  Patrecia Pour, NP    . sodium chloride 0.9 % bolus 1,000 mL 1,000 mL Intravenous Once Lajean Saver, MD  1,000 mL at 01/08/16 2346  . thiamine (B-1) injection 100 mg 100 mg Intravenous Daily Shawn C Joy, PA-C    . vitamin B-12 (CYANOCOBALAMIN) tablet 1,000 mcg 1,000 mcg Oral Daily Patrecia Pour, NP    . zolpidem (AMBIEN) tablet 5 mg 5 mg Oral QHS PRN Lorayne Bender, PA-C     Current Outpatient Prescriptions  Medication Sig Dispense Refill  . albuterol (PROVENTIL HFA;VENTOLIN HFA) 108 (90 Base) MCG/ACT inhaler Inhale 2 puffs into the lungs every 6 (six) hours as needed for wheezing or shortness of breath.    [name] aspirin EC 81 MG tablet Take 81 mg by mouth daily.    . budesonide-formoterol (SYMBICORT) 80-4.5 MCG/ACT inhaler Inhale 2 puffs into the lungs 2 (two) times daily.    [name] buPROPion (WELLBUTRIN XL) 300 MG 24 hr tablet Take 300 mg by mouth daily. Reported on 12/08/2015    . cholecalciferol (VITAMIN D) 1000 units tablet Take 1,000 Units by mouth daily.    [name] esomeprazole (NEXIUM) 40 MG capsule Take 40 mg by mouth daily before breakfast.    . ezetimibe (ZETIA) 10 MG tablet Take 10 mg by mouth daily.    [name] gabapentin (NEURONTIN) 300 MG capsule Take 300 mg by mouth 3 (three) times daily.     [name] loratadine (CLARITIN) 10 MG tablet Take 10 mg by mouth  daily as needed for allergies.     [name] losartan (COZAAR) 25 MG tablet Take 25 mg by mouth daily.    . meloxicam (MOBIC) 15 MG tablet Take 15 mg by mouth daily.    . metFORMIN (GLUCOPHAGE) 1000 MG tablet Take 1 tablet (1,000 mg total) by mouth 2 (two) times daily with a meal. 30 tablet 0  . methocarbamol (ROBAXIN) 500 MG tablet Take 500 mg by mouth every 8 (eight) hours as needed for muscle spasms.    [name] sertraline (ZOLOFT) 100 MG tablet Take 100 mg by mouth at bedtime.    . traMADol (ULTRAM) 50 MG tablet Take 50 mg by mouth every 6 (six)  hours as needed for moderate pain or severe pain (pain).     . vitamin B-12 (CYANOCOBALAMIN) 1000 MCG tablet Take 1,000 mcg by mouth daily. Reported on 12/08/2015    . [DISCONTINUED] insulin glargine (LANTUS) 100 UNIT/ML injection Inject 0.35 mLs (35 Units total) into the skin daily. (Patient not taking: Reported on 12/08/2015) 10 mL 11    Musculoskeletal: Strength & Muscle Tone: within normal limits Gait & Station: normal Patient leans: N/A  Psychiatric Specialty Exam: Physical Exam  Constitutional: He is oriented to person, place, and time. He appears well-developed and well-nourished.  HENT:  Head: Normocephalic.  Neck: Normal range of motion.  Respiratory: Effort normal.  Musculoskeletal: Normal range of motion.  Neurological: He is alert and oriented to person, place, and time.  Skin: Skin is warm and dry.  Psychiatric: His speech is normal and behavior is normal. Judgment normal. Cognition and memory are normal. He exhibits a depressed mood. He expresses suicidal ideation.    Review of Systems  Constitutional: Negative.  HENT: Negative.  Eyes: Negative.  Respiratory: Negative.  Cardiovascular: Negative.  Gastrointestinal: Negative.  Genitourinary: Negative.  Musculoskeletal: Negative.  Skin: Negative.  Neurological: Negative.  Endo/Heme/Allergies: Negative.  Psychiatric/Behavioral: Positive for depression, suicidal ideas and substance abuse.    Blood pressure 125/90, pulse 87, temperature 97.7 F (36.5 C), temperature source Oral, resp. rate 17, height _0  (1.854 m), weight 68.04 kg (150 lb), SpO2 100 %.Body mass index is 19.79 kg/(m^2).  General Appearance: Casual  Eye Contact: Good  Speech: Normal Rate  Volume: Normal  Mood: Depressed  Affect: Congruent  Thought Process: Coherent and Descriptions of Associations: Intact  Orientation: Full (Time, Place, and Person)  Thought Content: Rumination  Suicidal Thoughts:  Yes. without intent/plan  Homicidal Thoughts: No  Memory: Immediate; Fair Recent; Fair Remote; Fair  Judgement: Fair  Insight: Fair  Psychomotor Activity: Decreased  Concentration: Concentration: Fair and Attention Span: Fair  Recall: [employer] of Knowledge: Fair  Language: Fair  Akathisia: No  Handed: Right  AIMS (if indicated):    Assets: Leisure Time Physical Health Resilience  ADL's: Intact  Cognition: WNL  Sleep:      Treatment Plan Summary: Daily contact with patient to assess and evaluate symptoms and progress in treatment, Medication management and Plan cocaine dependence with cocaine induced mood disorder:  -Crisis stabilization -Medication management: Continue medical medications for diabetes along with gabapentin 300 mg TID for anxiety, Wellbutrin 300 mg daily for depression, and Zoloft 100 mg daily for depression -Individual and substance abuse counseling -Admit to [facility] Observation Unit -Contacted ACT team, [facility] while in the ED  Disposition: Supportive therapy provided about ongoing stressors. Explore options for shelter and for substance abuse treatment   Elmarie Shiley, NP-C 01/09/2016 16:00       Agree with NP note  and assessment as above

## 2016-01-09 NOTE — Consult Note (Signed)
[facility] Face-to-Face Psychiatry Consult   Reason for Consult:  Cocaine abuse with suicidal ideations Referring Physician:  EDP Patient Identification: Wayne [name] MRN:  [id number] Principal Diagnosis: Cocaine dependence with cocaine-induced mood disorder ([place]) Diagnosis:   Patient Active Problem List   Diagnosis Date Noted  . Cocaine dependence with cocaine-induced mood disorder [facility]) [F14.24] 01/09/2016    Priority: High  . Protein-calorie malnutrition, severe [E43] 12/02/2015  . Diabetes mellitus with nonketotic hyperosmolarity ([place]) [E11.00] 11/30/2015  . Major depressive disorder, recurrent severe without psychotic features ([place]) [F33.2]   . Onychomycosis [B35.1] 01/04/2014  . Ingrown nail [L60.0] 11/12/2013  . Onychia and paronychia of toe [Q25.956] 11/12/2013  . Deformity of metatarsal bone of right foot [M21.961] 08/17/2013  . Low TSH level [R94.6] 07/29/2011  . Peripheral neuropathy ([place]) [G62.9] 02/01/2011  . GERD [K21.9] 07/10/2010  . OBSTRUCTIVE SLEEP APNEA [G47.33] 09/26/2009  . IBS [K58.9] 06/10/2009  . SCIATICA, RIGHT [M54.30] 06/02/2009  . SUBSTANCE ABUSE, MULTIPLE [F19.10] 01/10/2009  . LUNG NODULE [J98.4] 12/04/2007  . HYPOGONADISM, MALE [E29.1] 10/09/2007  . ERECTILE DYSFUNCTION [F52.8] 09/14/2007  . DIABETES MELLITUS, TYPE II [E11.9] 04/12/2007  . HYPERLIPIDEMIA [E78.5] 04/12/2007  . Depression, major, recurrent ([place]) [F33.9] 04/12/2007  . Essential hypertension [I10] 04/12/2007  . OSTEOARTHRITIS [M19.90] 04/12/2007    Total Time spent with patient: 45 minutes  Subjective:   Wayne [name] is a 40 y.o. male patient admitted to [facility] Observation Unit.  HPI:  40 yo male who presented to the ED under the influence of cocaine and initially denied suicidal ideations.  Today, he just wants to "sleep" as he is tired.  He denies homicidal ideations, hallucinations, and withdrawal symptoms.  He expresses passive suicidal ideations.  [name] is under the care of [facility] Act team  who has not been able to find him lately, notified them.  Past Psychiatric History: depression, substance abuse  Risk to Self: Suicidal Ideation: Yes-Currently Present Suicidal Intent: No Is patient at risk for suicide?: Yes Suicidal Plan?: No Access to Means: No What has been your use of drugs/alcohol within the last 12 months?: cocaine use reported  How many times?: 2 Other Self Harm Risks: non-compliance with medication  Triggers for Past Attempts: Unpredictable Intentional Self Injurious Behavior: None Risk to Others: Homicidal Ideation: No Thoughts of Harm to Others: No Current Homicidal Intent: No Current Homicidal Plan: No Access to Homicidal Means: No Identified Victim: N/A History of harm to others?: No Assessment of Violence: None Noted Violent Behavior Description: No violent behaviors observed.  Does patient have access to weapons?: Yes (Comment) (Knives) Criminal Charges Pending?: Yes Describe Pending Criminal Charges: Larceny Does patient have a court date: Yes Court Date: 02/12/16 Prior Inpatient Therapy: Prior Inpatient Therapy: Yes Prior Therapy Dates: 2016 Prior Therapy Facilty/Provider(s): [facility] Reason for Treatment: depression Prior Outpatient Therapy: Prior Outpatient Therapy: Yes Prior Therapy Dates: current Prior Therapy Facilty/Provider(s): [facility] Reason for Treatment: medication management  Does patient have an ACCT team?: Yes Does patient have Intensive In-House Services?  : No Does patient have [facility] services? : No Does patient have P4CC services?: No  Past Medical History:  Past Medical History  Diagnosis Date  . Depression   . Diabetes mellitus   . Hyperlipidemia   . HTN (hypertension)   . Chronic headaches   . Osteoarthritis   . Kidney stones   . Polysubstance abuse     cocaine, marijuana-quit summer 2010  . Tobacco abuse   . Asthma   . Chest pain   .  Lumbar disc disease   . GERD (gastroesophageal reflux disease)   . OSA  (obstructive sleep apnea)   . Peripheral neuropathy ([place]) 02/01/2011  . Bipolar 1 disorder ([place])   . Ingrown nail 11/12/2013    Past Surgical History  Procedure Laterality Date  . Knee surgery    . Bunionectomy  06-2009   Family History:  Family History  Problem Relation Age of Onset  . Heart disease Mother   . Diabetes Father   . Hyperlipidemia Father   . Emphysema Maternal Uncle   . Asthma Sister   . Asthma Mother   . Stroke Mother   . Hypertension Father   . Alzheimer's disease Maternal Grandmother   . Depression Father   . Breast cancer Maternal Aunt   . COPD Mother     maternal un   Family Psychiatric  History: none Social History:  History  Alcohol Use No    Comment: socially     History  Drug Use  . Yes  . Special: Cocaine    Comment: former cocaine and use    Social History   Social History  . Marital Status: Single    Spouse Name: N/A  . Number of Children: 0  . Years of Education: N/A   Occupational History  . student and landscape    Social History Main Topics  . Smoking status: Current Some Day Smoker -- 0.20 packs/day for 17 years    Types: Cigarettes    Last Attempt to Quit: 02/24/2011  . Smokeless tobacco: Former Systems developer  . Alcohol Use: No     Comment: socially  . Drug Use: Yes    Special: Cocaine     Comment: former cocaine and use  . Sexual Activity: Not Asked   Other Topics Concern  . None   Social History Narrative   Additional Social History:    Allergies:   Allergies  Allergen Reactions  . Nystatin Other (See Comments)    Muscle weakness  . Statins Other (See Comments)    Muscle weakness, stomach cramps  . Iodine Rash  . Shellfish-Derived Products Rash    Labs:  Results for orders placed or performed during the hospital encounter of 01/08/16 (from the past 48 hour(s))  CBG monitoring, ED     Status: Abnormal   Collection Time: 01/08/16 10:21 PM  Result Value Ref Range   Glucose-Capillary 577 (HH) 65 - 99 mg/dL    Comment 1 Notify RN   CBC     Status: Abnormal   Collection Time: 01/08/16 10:22 PM  Result Value Ref Range   WBC 4.7 4.0 - 10.5 K/uL   RBC 3.85 (L) 4.22 - 5.81 MIL/uL   Hemoglobin 12.2 (L) 13.0 - 17.0 g/dL   HCT 35.3 (L) 39.0 - 52.0 %   MCV 91.7 78.0 - 100.0 fL   MCH 31.7 26.0 - 34.0 pg   MCHC 34.6 30.0 - 36.0 g/dL   RDW 13.1 11.5 - 15.5 %   Platelets 150 150 - 400 K/uL  Comprehensive metabolic panel     Status: Abnormal   Collection Time: 01/08/16 10:22 PM  Result Value Ref Range   Sodium 134 (L) 135 - 145 mmol/L   Potassium 3.6 3.5 - 5.1 mmol/L   Chloride 100 (L) 101 - 111 mmol/L   CO2 22 22 - 32 mmol/L   Glucose, Bld 713 (HH) 65 - 99 mg/dL    Comment: CRITICAL RESULT CALLED TO, READ BACK BY AND VERIFIED WITH: JAKE  Everlean Patterson [id number] @ 2304 BY J SCOTTON    BUN 13 6 - 20 mg/dL   Creatinine, Ser 1.25 (H) 0.61 - 1.24 mg/dL   Calcium 8.8 (L) 8.9 - 10.3 mg/dL   Total Protein 6.8 6.5 - 8.1 g/dL   Albumin 3.6 3.5 - 5.0 g/dL   AST 26 15 - 41 U/L   ALT 14 (L) 17 - 63 U/L   Alkaline Phosphatase 91 38 - 126 U/L   Total Bilirubin 0.6 0.3 - 1.2 mg/dL   GFR calc non Af Amer >60 >60 mL/min   GFR calc Af Amer >60 >60 mL/min    Comment: (NOTE) The eGFR has been calculated using the CKD EPI equation. This calculation has not been validated in all clinical situations. eGFR's persistently <60 mL/min signify possible Chronic Kidney Disease.    Anion gap 12 5 - 15  Ethanol     Status: None   Collection Time: 01/08/16 10:22 PM  Result Value Ref Range   Alcohol, Ethyl (B) <5 <5 mg/dL    Comment:        LOWEST DETECTABLE LIMIT FOR SERUM ALCOHOL IS 5 mg/dL FOR MEDICAL PURPOSES ONLY   Acetaminophen level     Status: Abnormal   Collection Time: 01/08/16 10:22 PM  Result Value Ref Range   Acetaminophen (Tylenol), Serum <10 (L) 10 - 30 ug/mL    Comment:        THERAPEUTIC CONCENTRATIONS VARY SIGNIFICANTLY. A RANGE OF 10-30 ug/mL MAY BE AN EFFECTIVE CONCENTRATION FOR MANY  PATIENTS. HOWEVER, SOME ARE BEST TREATED AT CONCENTRATIONS OUTSIDE THIS RANGE. ACETAMINOPHEN CONCENTRATIONS >150 ug/mL AT 4 HOURS AFTER INGESTION AND >50 ug/mL AT 12 HOURS AFTER INGESTION ARE OFTEN ASSOCIATED WITH TOXIC REACTIONS.   Salicylate level     Status: None   Collection Time: 01/08/16 10:22 PM  Result Value Ref Range   Salicylate Lvl <2.6 2.8 - 30.0 mg/dL  Urinalysis, Routine w reflex microscopic (not at [facility])     Status: Abnormal   Collection Time: 01/08/16 11:06 PM  Result Value Ref Range   Color, Urine YELLOW YELLOW   APPearance CLEAR CLEAR   Specific Gravity, Urine 1.041 (H) 1.005 - 1.030   pH 5.5 5.0 - 8.0   Glucose, UA >1000 (A) NEGATIVE mg/dL   Hgb urine dipstick NEGATIVE NEGATIVE   Bilirubin Urine NEGATIVE NEGATIVE   Ketones, ur NEGATIVE NEGATIVE mg/dL   Protein, ur NEGATIVE NEGATIVE mg/dL   Nitrite NEGATIVE NEGATIVE   Leukocytes, UA NEGATIVE NEGATIVE  Urine rapid drug screen (hosp performed)     Status: Abnormal   Collection Time: 01/08/16 11:06 PM  Result Value Ref Range   Opiates NONE DETECTED NONE DETECTED   Cocaine POSITIVE (A) NONE DETECTED   Benzodiazepines NONE DETECTED NONE DETECTED   Amphetamines NONE DETECTED NONE DETECTED   Tetrahydrocannabinol NONE DETECTED NONE DETECTED   Barbiturates NONE DETECTED NONE DETECTED    Comment:        DRUG SCREEN FOR MEDICAL PURPOSES ONLY.  IF CONFIRMATION IS NEEDED FOR ANY PURPOSE, NOTIFY LAB WITHIN 5 DAYS.        LOWEST DETECTABLE LIMITS FOR URINE DRUG SCREEN Drug Class       Cutoff (ng/mL) Amphetamine      1000 Barbiturate      200 Benzodiazepine   834 Tricyclics       196 Opiates          300 Cocaine          300 THC  50   Urine microscopic-add on     Status: Abnormal   Collection Time: 01/08/16 11:06 PM  Result Value Ref Range   Squamous Epithelial / LPF NONE SEEN NONE SEEN   WBC, UA 0-5 0 - 5 WBC/hpf   RBC / HPF NONE SEEN 0 - 5 RBC/hpf   Bacteria, UA RARE (A) NONE SEEN  Blood  gas, venous     Status: Abnormal   Collection Time: 01/08/16 11:12 PM  Result Value Ref Range   FIO2 0.21    Delivery systems ROOM AIR    pH, Ven 7.348 (H) 7.250 - 7.300   pCO2, Ven 46.8 45.0 - 50.0 mmHg   pO2, Ven 65.7 (H) 31.0 - 45.0 mmHg   Bicarbonate 25.0 (H) 20.0 - 24.0 mEq/L   TCO2 22.8 0 - 100 mmol/L   Acid-base deficit 0.4 0.0 - 2.0 mmol/L   O2 Saturation 92.6 %   Patient temperature 98.6    Collection site VEIN    Drawn by COLLECTED BY LABORATORY    Sample type VENOUS   Lipase, blood     Status: None   Collection Time: 01/08/16 11:17 PM  Result Value Ref Range   Lipase 20 11 - 51 U/L  Magnesium     Status: None   Collection Time: 01/08/16 11:17 PM  Result Value Ref Range   Magnesium 1.8 1.7 - 2.4 mg/dL  CBG monitoring, ED     Status: Abnormal   Collection Time: 01/09/16 12:01 AM  Result Value Ref Range   Glucose-Capillary 563 (HH) 65 - 99 mg/dL   Comment 1 Notify RN    Comment 2 Document in Chart   CBG monitoring, ED     Status: Abnormal   Collection Time: 01/09/16  1:09 AM  Result Value Ref Range   Glucose-Capillary 487 (H) 65 - 99 mg/dL  I-stat Chem 8, ED     Status: Abnormal   Collection Time: 01/09/16  1:58 AM  Result Value Ref Range   Sodium 143 135 - 145 mmol/L   Potassium 3.3 (L) 3.5 - 5.1 mmol/L   Chloride 105 101 - 111 mmol/L   BUN 11 6 - 20 mg/dL   Creatinine, Ser 0.80 0.61 - 1.24 mg/dL   Glucose, Bld 370 (H) 65 - 99 mg/dL   Calcium, Ion 1.18 1.12 - 1.23 mmol/L   TCO2 27 0 - 100 mmol/L   Hemoglobin 12.9 (L) 13.0 - 17.0 g/dL   HCT 38.0 (L) 39.0 - 52.0 %  CBG monitoring, ED     Status: Abnormal   Collection Time: 01/09/16  2:13 AM  Result Value Ref Range   Glucose-Capillary 281 (H) 65 - 99 mg/dL  CBG monitoring, ED     Status: Abnormal   Collection Time: 01/09/16  3:19 AM  Result Value Ref Range   Glucose-Capillary 191 (H) 65 - 99 mg/dL  CBG monitoring, ED     Status: Abnormal   Collection Time: 01/09/16  4:27 AM  Result Value Ref Range    Glucose-Capillary 146 (H) 65 - 99 mg/dL  CBG monitoring, ED     Status: Abnormal   Collection Time: 01/09/16  5:17 AM  Result Value Ref Range   Glucose-Capillary 162 (H) 65 - 99 mg/dL  CBG monitoring, ED     Status: Abnormal   Collection Time: 01/09/16  8:35 AM  Result Value Ref Range   Glucose-Capillary 232 (H) 65 - 99 mg/dL    Current Facility-Administered Medications  Medication Dose Route  Frequency Provider Last Rate Last Dose  . 0.9 %  sodium chloride infusion  1,000 mL Intravenous Continuous Lorayne Bender, PA-C   Stopped at 01/09/16 0510  . acetaminophen (TYLENOL) tablet 650 mg  650 mg Oral Q4H PRN Shawn C Joy, PA-C      . alum & mag hydroxide-simeth (MAALOX/MYLANTA) 200-200-20 MG/5ML suspension 30 mL  30 mL Oral PRN Shawn C Joy, PA-C      . aspirin EC tablet 81 mg  81 mg Oral Daily Patrecia Pour, NP      . buPROPion (WELLBUTRIN XL) 24 hr tablet 300 mg  300 mg Oral Daily Patrecia Pour, NP      . cholecalciferol (VITAMIN D) tablet 1,000 Units  1,000 Units Oral Daily Patrecia Pour, NP      . dextrose 5 %-0.45 % sodium chloride infusion   Intravenous Continuous Shawn C Joy, PA-C   Stopped at 01/09/16 0510  . ezetimibe (ZETIA) tablet 10 mg  10 mg Oral Daily Patrecia Pour, NP      . gabapentin (NEURONTIN) capsule 300 mg  300 mg Oral TID Patrecia Pour, NP      . ibuprofen (ADVIL,MOTRIN) tablet 600 mg  600 mg Oral Q8H PRN Shawn C Joy, PA-C   600 mg at 01/09/16 0626  . insulin regular (NOVOLIN R,HUMULIN R) 250 Units in sodium chloride 0.9 % 250 mL (1 Units/mL) infusion   Intravenous Continuous Lorayne Bender, PA-C   Stopped at 01/09/16 0509  . loratadine (CLARITIN) tablet 10 mg  10 mg Oral Daily PRN Patrecia Pour, NP      . losartan (COZAAR) tablet 25 mg  25 mg Oral Daily Patrecia Pour, NP      . meloxicam (MOBIC) tablet 15 mg  15 mg Oral Daily Patrecia Pour, NP      . metFORMIN (GLUCOPHAGE) tablet 1,000 mg  1,000 mg Oral BID WC Patrecia Pour, NP      . methocarbamol (ROBAXIN) tablet  500 mg  500 mg Oral Q8H PRN Patrecia Pour, NP      . mometasone-formoterol (DULERA) 100-5 MCG/ACT inhaler 2 puff  2 puff Inhalation BID Patrecia Pour, NP      . nicotine (NICODERM CQ - dosed in mg/24 hours) patch 21 mg  21 mg Transdermal Daily Shawn C Joy, PA-C      . ondansetron (ZOFRAN) tablet 4 mg  4 mg Oral Q8H PRN Shawn C Joy, PA-C      . pantoprazole (PROTONIX) EC tablet 40 mg  40 mg Oral Daily Patrecia Pour, NP      . sertraline (ZOLOFT) tablet 100 mg  100 mg Oral QHS Patrecia Pour, NP      . sodium chloride 0.9 % bolus 1,000 mL  1,000 mL Intravenous Once Lajean Saver, MD   1,000 mL at 01/08/16 2346  . thiamine (B-1) injection 100 mg  100 mg Intravenous Daily Shawn C Joy, PA-C      . vitamin B-12 (CYANOCOBALAMIN) tablet 1,000 mcg  1,000 mcg Oral Daily Patrecia Pour, NP      . zolpidem (AMBIEN) tablet 5 mg  5 mg Oral QHS PRN Lorayne Bender, PA-C       Current Outpatient Prescriptions  Medication Sig Dispense Refill  . albuterol (PROVENTIL HFA;VENTOLIN HFA) 108 (90 Base) MCG/ACT inhaler Inhale 2 puffs into the lungs every 6 (six) hours as needed for wheezing or shortness of breath.    [name]  aspirin EC 81 MG tablet Take 81 mg by mouth daily.    . budesonide-formoterol (SYMBICORT) 80-4.5 MCG/ACT inhaler Inhale 2 puffs into the lungs 2 (two) times daily.    [name] buPROPion (WELLBUTRIN XL) 300 MG 24 hr tablet Take 300 mg by mouth daily. Reported on 12/08/2015    . cholecalciferol (VITAMIN D) 1000 units tablet Take 1,000 Units by mouth daily.    [name] esomeprazole (NEXIUM) 40 MG capsule Take 40 mg by mouth daily before breakfast.    . ezetimibe (ZETIA) 10 MG tablet Take 10 mg by mouth daily.    [name] gabapentin (NEURONTIN) 300 MG capsule Take 300 mg by mouth 3 (three) times daily.     [name] loratadine (CLARITIN) 10 MG tablet Take 10 mg by mouth daily as needed for allergies.     [name] losartan (COZAAR) 25 MG tablet Take 25 mg by mouth daily.    . meloxicam (MOBIC) 15 MG tablet Take 15 mg by mouth daily.    .  metFORMIN (GLUCOPHAGE) 1000 MG tablet Take 1 tablet (1,000 mg total) by mouth 2 (two) times daily with a meal. 30 tablet 0  . methocarbamol (ROBAXIN) 500 MG tablet Take 500 mg by mouth every 8 (eight) hours as needed for muscle spasms.    [name] sertraline (ZOLOFT) 100 MG tablet Take 100 mg by mouth at bedtime.    . traMADol (ULTRAM) 50 MG tablet Take 50 mg by mouth every 6 (six) hours as needed for moderate pain or severe pain (pain).     . vitamin B-12 (CYANOCOBALAMIN) 1000 MCG tablet Take 1,000 mcg by mouth daily. Reported on 12/08/2015    . [DISCONTINUED] insulin glargine (LANTUS) 100 UNIT/ML injection Inject 0.35 mLs (35 Units total) into the skin daily. (Patient not taking: Reported on 12/08/2015) 10 mL 11    Musculoskeletal: Strength & Muscle Tone: within normal limits Gait & Station: normal Patient leans: N/A  Psychiatric Specialty Exam: Physical Exam  Constitutional: He is oriented to person, place, and time. He appears well-developed and well-nourished.  HENT:  Head: Normocephalic.  Neck: Normal range of motion.  Respiratory: Effort normal.  Musculoskeletal: Normal range of motion.  Neurological: He is alert and oriented to person, place, and time.  Skin: Skin is warm and dry.  Psychiatric: His speech is normal and behavior is normal. Judgment normal. Cognition and memory are normal. He exhibits a depressed mood. He expresses suicidal ideation.    Review of Systems  Constitutional: Negative.   HENT: Negative.   Eyes: Negative.   Respiratory: Negative.   Cardiovascular: Negative.   Gastrointestinal: Negative.   Genitourinary: Negative.   Musculoskeletal: Negative.   Skin: Negative.   Neurological: Negative.   Endo/Heme/Allergies: Negative.   Psychiatric/Behavioral: Positive for depression, suicidal ideas and substance abuse.    Blood pressure 125/90, pulse 87, temperature 97.7 F (36.5 C), temperature source Oral, resp. rate 17, height '6\' 1"'  (1.854 m), weight 68.04 kg (150  lb), SpO2 100 %.Body mass index is 19.79 kg/(m^2).  General Appearance: Casual  Eye Contact:  Good  Speech:  Normal Rate  Volume:  Normal  Mood:  Depressed  Affect:  Congruent  Thought Process:  Coherent and Descriptions of Associations: Intact  Orientation:  Full (Time, Place, and Person)  Thought Content:  Rumination  Suicidal Thoughts:  Yes.  without intent/plan  Homicidal Thoughts:  No  Memory:  Immediate;   Fair Recent;   Fair Remote;   Fair  Judgement:  Fair  Insight:  Fair  Psychomotor  Activity:  Decreased  Concentration:  Concentration: Fair and Attention Span: Fair  Recall:  [employer] of Knowledge:  Fair  Language:  Fair  Akathisia:  No  Handed:  Right  AIMS (if indicated):     Assets:  Leisure Time Physical Health Resilience  ADL's:  Intact  Cognition:  WNL  Sleep:        Treatment Plan Summary: Daily contact with patient to assess and evaluate symptoms and progress in treatment, Medication management and Plan cocaine dependence with cocaine induced mood disorder:  -Crisis stabilization -Medication management:  Continue medical medications along with gabapentin 300 mg TID for anxiety, Wellbutrin 300 mg daily for depression, and Zoloft 100 mg daily for depression -Individual and substance abuse counseling -Transfer to [facility] OBs -Contacted ACT team, [facility]  Disposition: Supportive therapy provided about ongoing stressors.  Waylan Boga, NP 01/09/2016 10:25 AM Patient seen face-to-face for psychiatric evaluation, chart reviewed and case discussed with the physician extender and developed treatment plan. Reviewed the information documented and agree with the treatment plan. Corena Pilgrim, MD

## 2016-01-09 NOTE — BH Assessment (Signed)
[facility] Assessment Progress Note  Per Thedore MinsMojeed Akintayo, MD, this pt [facility].  Berneice Heinrichina Tate, RN, [facility] Obs [facility].  Pt has signed Voluntary Admission and Consent for Treatment, as well as Consent to Release Information to the [facility] ACT Team, his outpatient provider, and a notification call has been placed.  [facility]  Pt's nurse, Kendal Hymendie, has been notified, and agrees to send original paperwork along with pt via [facility] [place], and to call report to [phone] or [phone].  Doylene Canninghomas Angelene Rome, MA Triage Specialist [phone]

## 2016-01-09 NOTE — Progress Notes (Signed)
D: Patient spent majority of remainder of shift asleep.  Woke up briefly for lunch, to meet with the provider, and to eat dinner.  Passive SI, says he will not harm self on unit.  CBG 338 before lunch.  K+ was 3.3 in ER, received in ER per MAR.    No orders for insulin when arrived on unit. Sullen affect, depressed mood. A: NP notified of blood sugar and potassium.  potassium given, order received for AM BMP blood draw- patient notified. Orders received for mealtime novolog + sliding scale novolog and diabetic coordinator consult.  Administered insulin per order.  No disposition received this shift.  Continued on continuous observation. R: Blood sugars stabilized in evening, 186 before dinner.  Pleasant at dinner, still c/o depression. Fell asleep shortly after dinner.  Remains safe on unit.

## 2016-01-09 NOTE — Progress Notes (Signed)
D: Patient sleeping at the time of shift change. Staff woke patient up for bedtime meds and assessment. Patient quite cheerful, appropriate and cooperative. Denies pain, SI, AH/VH at this time. Patient presents with poor hygiene. While staff was encouraging patient to take a shower, patient observed that he poop on himself. Patient stated "ohh, its the metformin I took. It makes go to the bathroom often" Patient had gone to the bathroom afterwards 5 times within 30 mins.  A: Staff notified Drenda FreezeFran NP, who ordered Imodium 2 mg prn. Support and encouragement offered as needed. Constant monitoring/observation maintained for safety and stability.  R; Patient is safe.  Staff

## 2016-01-09 NOTE — Progress Notes (Addendum)
Admit note:  [facility]ations.  Patient is a 40 yo [facility]."  Patient could not specify a plan at this time.  Patient states that he is basically homeless and has nowhere to stay.  He denies any AVH/HI.  Patient reports use of crack cocaine, but could not specify how much.  He has several pening criminal charges and has an upcoming court date.  Patient's CBG upon arrival. He was lethargic and extremely drowsy.Patient is followed through [facility] ACT team. Patient has a medical hx of asthma and HTN.  He was taken immediately to OBS to eat his lunch and rest.

## 2016-01-09 NOTE — ED Notes (Signed)
Pt discharged ambulatory.  AM meds were not given due to delay from pharmacy.  All belongings were sent with patient.

## 2016-01-09 NOTE — Progress Notes (Signed)
Patient is non-compliant with his medications and is a poor historian. His mother has prescription bottles at home but many were > 3544yr old. I obtained pharmacy records and added last filled dates on some of the home meds. I am unable to confirm any other information.   Charolotte Ekeom Parley Pidcock, PharmD, pager [phone]. 01/09/2016,11:22 AM.

## 2016-01-09 NOTE — BH Assessment (Signed)
Assessment completed. Consulted Alberteen SamFran Hobson, NP who recommended inpatient treatment. TTS to seek placement. Informed Harolyn RutherfordShawn Joy, PA-C of the recommendation.

## 2016-01-10 DIAGNOSIS — F1424 Cocaine dependence with cocaine-induced mood disorder: Secondary | ICD-10-CM | POA: Diagnosis not present

## 2016-01-10 LAB — BASIC METABOLIC PANEL
ANION GAP: 6 (ref 5–15)
BUN: 15 mg/dL (ref 6–20)
CALCIUM: 8.8 mg/dL — AB (ref 8.9–10.3)
CO2: 27 mmol/L (ref 22–32)
Chloride: 106 mmol/L (ref 101–111)
Creatinine, Ser: 0.87 mg/dL (ref 0.61–1.24)
GFR calc Af Amer: >60 mL/min (ref >60)
Glucose, Bld: 309 mg/dL — ABNORMAL HIGH (ref 65–99)
POTASSIUM: 4.1 mmol/L (ref 3.5–5.1)
SODIUM: 139 mmol/L (ref 135–145)

## 2016-01-10 LAB — GLUCOSE, CAPILLARY
GLUCOSE-CAPILLARY: 329 mg/dL — AB (ref 65–99)
GLUCOSE-CAPILLARY: 138 mg/dL — AB (ref 65–99)
GLUCOSE-CAPILLARY: 211 mg/dL — AB (ref 65–99)
Glucose-Capillary: 229 mg/dL — ABNORMAL HIGH (ref 65–99)

## 2016-01-10 MED ORDER — VITAMIN B-1 100 MG PO TABS
100.0000 mg | ORAL_TABLET | Freq: Every day | ORAL | Status: DC
  Administered 2016-01-10 – 2016-01-11 (×2): 100 mg via ORAL
  Filled 2016-01-10 (×2): qty 1

## 2016-01-10 MED ORDER — INSULIN GLARGINE 100 UNIT/ML ~~LOC~~ SOLN
20.0000 [IU] | Freq: Every day | SUBCUTANEOUS | Status: DC
  Administered 2016-01-11: 20 [IU] via SUBCUTANEOUS

## 2016-01-10 MED ORDER — ARIPIPRAZOLE 5 MG PO TABS
5.0000 mg | ORAL_TABLET | Freq: Every day | ORAL | Status: DC
  Administered 2016-01-10 – 2016-01-11 (×2): 5 mg via ORAL
  Filled 2016-01-10 (×2): qty 1

## 2016-01-10 MED ORDER — INSULIN DETEMIR 100 UNIT/ML ~~LOC~~ SOLN
10.0000 [IU] | Freq: Every day | SUBCUTANEOUS | Status: DC
  Administered 2016-01-10: 10 [IU] via SUBCUTANEOUS

## 2016-01-10 NOTE — Progress Notes (Signed)
Inpatient Diabetes Program Recommendations  AACE/ADA: New Consensus Statement on Inpatient Glycemic Control (2015)  Target Ranges:  Prepandial:   less than 140 mg/dL      Peak postprandial:   less than 180 mg/dL (1-2 hours)      Critically ill patients:  140 - 180 mg/dL   Lab Results  Component Value Date   GLUCAP 329* 01/10/2016   HGBA1C 15.3* 12/01/2015    Review of Glycemic Control  Inpatient Diabetes Program Recommendations:    PLEASE SEE DIABETES COORDINATOR NOTE DATED 01/09/16  Will follow along with you this admission. Thank you  Piedad ClimesGina Aubrii Sharpless MSN, RN,CDE Inpatient Diabetes Coordinator [phone] (team pager)

## 2016-01-10 NOTE — BHH Counselor (Signed)
Spoke with Julieanne Cottonina AC nurse and Vernona RiegerLaura, NP regarding pt. Disposition. This counselor to contact ACCT crisis team hotline for pt care. Whitten Andreoni K. Sherlon HandingHarris, LCAS-A, LPC-A, [facility]  Counselor 01/10/2016 1:07 PM

## 2016-01-10 NOTE — Progress Notes (Signed)
Patient is resting quietly. He is calm and cooperative. He rated his depression an 8, anxiety a 7, and hopelessness a 5. He endorses current suicidal ideation without plan., denies HI, and AVH. Noted to have mild tremors and slightly diaphoretic. He stated that he was having diarrhea earlier, but it was slacking.   Patient remains safe with constant observation. Medications administered accordingly. Support, education, and encouragement offered.   Patient is receptive and compliant, will continue to monitor.

## 2016-01-10 NOTE — B[facility] Counselor (Addendum)
Pt. Awake during lunch. Pt gave verbal consent to contact ACCT team crisis line. Just shortly after [employer] officers showed up to conform pt. Is here at hospital. Per Inetta Fermoina, RN [facility] this counselor went top lobby to [facility] upon given verbal consent also from pt. To Inetta Fermoina regarding disclosure.  According to [employer] pt. Had outstanding missing persons in [employer] system which was tried numerous times to clear up. [facility] pt. Will be cleared of missing person report and as per verbal consent and disclosure verified pt. [facility]. No warrant or outstanding court order was presented at time of conversation with [employer]. [employer] stated main concern was at present with outstanding missing person report filed.  Per conversation with Julieanne Cottonina, [facility], only info disclosed was that [facility]. Inetta Fermoina was present with this counselor during conversation with [employer].  Burdette Forehand K. Sherlon HandingHarris, LCAS-A, LPC-A, [facility]  Counselor 01/10/2016 12:46 PM

## 2016-01-10 NOTE — BHH Counselor (Signed)
Pt not awake this a.m. As of yet. This counselor attempted to contact ACCT team PSI,  Voice mail message. When pt. Alert./ confirmation can contact crisis line ACCT [phone] and/or [phone] for housing. Elyna Pangilinan K. Sherlon HandingHarris, LCAS-A, LPC-A, [facility]  Counselor 01/10/2016 9:19 AM

## 2016-01-10 NOTE — Progress Notes (Signed)
[place] Unit Progress Note  01/10/2016 3:19 PM Wayne [name]  MRN:  [id number] Subjective:    Patient states "I just don't feel like living anymore. If I left here today there are numerous ways that I would hurt myself. I probably have some old insulin at my mother's house.  I might run in front of a car, overdose on insulin that I have, or cut myself in the neck. I was living in a [place] for a while. I did not eat much or have any insulin. I feel a bit shaky. I think it's because I am used to my blood sugar being around 500."   Objective:   Patient is seen and chart is reviewed. The patient appears depressed and was difficult to engage in assessment. Today he rates his depression at a nine and endorses several plans of suicide. He reports living in his [place] for the past several months, with his ACCT team having trouble locating him. Patient reports being off "the injections" for some time reporting abilify helped him in the past. Per Darlen Round counselor the patient was recently identified as a missing person as [employer] presented to [facility] to investigate. Patient's mother was also contacted by the counselor who was happy to learn of her son's location and will allow him to stay with her after discharge.   Principal Problem: Cocaine dependence with cocaine-induced mood disorder ([place]) Diagnosis:   Patient Active Problem List   Diagnosis Date Noted  . Cocaine dependence with cocaine-induced mood disorder ([place]) [F14.24] 01/09/2016  . Protein-calorie malnutrition, severe [E43] 12/02/2015  . Diabetes mellitus with nonketotic hyperosmolarity ([place]) [E11.00] 11/30/2015  . Major depressive disorder, recurrent severe without psychotic features ([place]) [F33.2]   . Onychomycosis [B35.1] 01/04/2014  . Ingrown nail [L60.0] 11/12/2013  . Onychia and paronychia of toe [J62.836] 11/12/2013  . Deformity of metatarsal bone of right foot [M21.961] 08/17/2013  . Low TSH level [R94.6] 07/29/2011  . Peripheral neuropathy  ([place]) [G62.9] 02/01/2011  . GERD [K21.9] 07/10/2010  . OBSTRUCTIVE SLEEP APNEA [G47.33] 09/26/2009  . IBS [K58.9] 06/10/2009  . SCIATICA, RIGHT [M54.30] 06/02/2009  . SUBSTANCE ABUSE, MULTIPLE [F19.10] 01/10/2009  . LUNG NODULE [J98.4] 12/04/2007  . HYPOGONADISM, MALE [E29.1] 10/09/2007  . ERECTILE DYSFUNCTION [F52.8] 09/14/2007  . DIABETES MELLITUS, TYPE II [E11.9] 04/12/2007  . HYPERLIPIDEMIA [E78.5] 04/12/2007  . Depression, major, recurrent ([place]) [F33.9] 04/12/2007  . Essential hypertension [I10] 04/12/2007  . OSTEOARTHRITIS [M19.90] 04/12/2007   Total Time spent with patient: 30 minutes  Past Psychiatric History: MDD  Past Medical History:  Past Medical History  Diagnosis Date  . Depression   . Diabetes mellitus   . Hyperlipidemia   . HTN (hypertension)   . Chronic headaches   . Osteoarthritis   . Kidney stones   . Polysubstance abuse     cocaine, marijuana-quit summer 2010  . Tobacco abuse   . Asthma   . Chest pain   . Lumbar disc disease   . GERD (gastroesophageal reflux disease)   . OSA (obstructive sleep apnea)   . Peripheral neuropathy ([place]) 02/01/2011  . Bipolar 1 disorder ([place])   . Ingrown nail 11/12/2013    Past Surgical History  Procedure Laterality Date  . Knee surgery    . Bunionectomy  06-2009   Family History:  Family History  Problem Relation Age of Onset  . Heart disease Mother   . Diabetes Father   . Hyperlipidemia Father   . Emphysema Maternal Uncle   . Asthma Sister   .  Asthma Mother   . Stroke Mother   . Hypertension Father   . Alzheimer's disease Maternal Grandmother   . Depression Father   . Breast cancer Maternal Aunt   . COPD Mother     maternal un   Family Psychiatric  History: See H & P Social History:  History  Alcohol Use No    Comment: socially     History  Drug Use  . Yes  . Special: Cocaine    Comment: former cocaine and use    Social History   Social History  . Marital Status: Single    Spouse Name: N/A   . Number of Children: 0  . Years of Education: N/A   Occupational History  . student and landscape    Social History Main Topics  . Smoking status: Current Some Day Smoker -- 0.20 packs/day for 17 years    Types: Cigarettes    Last Attempt to Quit: 02/24/2011  . Smokeless tobacco: Former Systems developer  . Alcohol Use: No     Comment: socially  . Drug Use: Yes    Special: Cocaine     Comment: former cocaine and use  . Sexual Activity: Not Asked   Other Topics Concern  . None   Social History Narrative   Additional Social History:                         Sleep: Fair  Appetite:  Fair  Current Medications: Current Facility-Administered Medications  Medication Dose Route Frequency Provider Last Rate Last Dose  . acetaminophen (TYLENOL) tablet 650 mg  650 mg Oral Q6H PRN Patrecia Pour, NP      . alum & mag hydroxide-simeth (MAALOX/MYLANTA) 200-200-20 MG/5ML suspension 30 mL  30 mL Oral Q4H PRN Patrecia Pour, NP      . ARIPiprazole (ABILIFY) tablet 5 mg  5 mg Oral Daily Niel Hummer, NP      . aspirin EC tablet 81 mg  81 mg Oral Daily Patrecia Pour, NP   81 mg at 01/10/16 0925  . buPROPion (WELLBUTRIN XL) 24 hr tablet 300 mg  300 mg Oral Daily Patrecia Pour, NP   300 mg at 01/10/16 0924  . cholecalciferol (VITAMIN D) tablet 1,000 Units  1,000 Units Oral Daily Patrecia Pour, NP   1,000 Units at 01/10/16 0924  . ezetimibe (ZETIA) tablet 10 mg  10 mg Oral Daily Patrecia Pour, NP   10 mg at 01/10/16 0924  . gabapentin (NEURONTIN) capsule 300 mg  300 mg Oral TID Patrecia Pour, NP   300 mg at 01/10/16 1203  . ibuprofen (ADVIL,MOTRIN) tablet 600 mg  600 mg Oral Q8H PRN Patrecia Pour, NP      . insulin aspart (novoLOG) injection 0-15 Units  0-15 Units Subcutaneous TID WC Niel Hummer, NP   5 Units at 01/10/16 1203  . insulin aspart (novoLOG) injection 0-5 Units  0-5 Units Subcutaneous QHS Niel Hummer, NP   0 Units at 01/09/16 2119  . [START ON 01/11/2016] insulin glargine  (LANTUS) injection 20 Units  20 Units Subcutaneous Daily Niel Hummer, NP      . loperamide (IMODIUM) capsule 2 mg  2 mg Oral PRN Lurena Nida, NP   2 mg at 01/10/16 0925  . loratadine (CLARITIN) tablet 10 mg  10 mg Oral Daily PRN Patrecia Pour, NP      . losartan (COZAAR) tablet  25 mg  25 mg Oral Daily Patrecia Pour, NP   25 mg at 01/10/16 0925  . magnesium hydroxide (MILK OF MAGNESIA) suspension 30 mL  30 mL Oral Daily PRN Patrecia Pour, NP      . meloxicam (MOBIC) tablet 15 mg  15 mg Oral Daily Patrecia Pour, NP   15 mg at 01/10/16 0925  . metFORMIN (GLUCOPHAGE) tablet 1,000 mg  1,000 mg Oral BID WC Patrecia Pour, NP   1,000 mg at 01/10/16 1194  . methocarbamol (ROBAXIN) tablet 500 mg  500 mg Oral Q8H PRN Patrecia Pour, NP   500 mg at 01/10/16 0941  . mometasone-formoterol (DULERA) 100-5 MCG/ACT inhaler 2 puff  2 puff Inhalation BID Patrecia Pour, NP   2 puff at 01/10/16 0932  . nicotine (NICODERM CQ - dosed in mg/24 hours) patch 21 mg  21 mg Transdermal Daily Patrecia Pour, NP   21 mg at 01/10/16 0925  . ondansetron (ZOFRAN) tablet 4 mg  4 mg Oral Q8H PRN Patrecia Pour, NP      . pantoprazole (PROTONIX) EC tablet 40 mg  40 mg Oral Daily Patrecia Pour, NP   40 mg at 01/10/16 0923  . sertraline (ZOLOFT) tablet 100 mg  100 mg Oral QHS Patrecia Pour, NP   100 mg at 01/09/16 2118  . thiamine (VITAMIN B-1) tablet 100 mg  100 mg Oral Daily Niel Hummer, NP      . vitamin B-12 (CYANOCOBALAMIN) tablet 1,000 mcg  1,000 mcg Oral Daily Patrecia Pour, NP   1,000 mcg at 01/10/16 1740  . zolpidem (AMBIEN) tablet 5 mg  5 mg Oral QHS PRN Patrecia Pour, NP        Lab Results:  Results for orders placed or performed during the hospital encounter of 01/09/16 (from the past 48 hour(s))  Glucose, capillary     Status: Abnormal   Collection Time: 01/09/16 11:56 AM  Result Value Ref Range   Glucose-Capillary 338 (H) 65 - 99 mg/dL   Comment 1 Notify RN    Comment 2 Document in Chart   Glucose,  capillary     Status: Abnormal   Collection Time: 01/09/16  4:42 PM  Result Value Ref Range   Glucose-Capillary 189 (H) 65 - 99 mg/dL   Comment 1 Notify RN    Comment 2 Document in Chart   Glucose, capillary     Status: Abnormal   Collection Time: 01/09/16  9:02 PM  Result Value Ref Range   Glucose-Capillary 140 (H) 65 - 99 mg/dL  Basic metabolic panel     Status: Abnormal   Collection Time: 01/10/16  6:30 AM  Result Value Ref Range   Sodium 139 135 - 145 mmol/L   Potassium 4.1 3.5 - 5.1 mmol/L   Chloride 106 101 - 111 mmol/L   CO2 27 22 - 32 mmol/L   Glucose, Bld 309 (H) 65 - 99 mg/dL   BUN 15 6 - 20 mg/dL   Creatinine, Ser 0.87 0.61 - 1.24 mg/dL   Calcium 8.8 (L) 8.9 - 10.3 mg/dL   GFR calc non Af Amer >60 >60 mL/min   GFR calc Af Amer >60 >60 mL/min    Comment: (NOTE) The eGFR has been calculated using the CKD EPI equation. This calculation has not been validated in all clinical situations. eGFR's persistently <60 mL/min signify possible Chronic Kidney Disease.    Anion gap 6 5 - 15  Comment: Performed at [facility]  Glucose, capillary     Status: Abnormal   Collection Time: 01/10/16  6:54 AM  Result Value Ref Range   Glucose-Capillary 329 (H) 65 - 99 mg/dL  Glucose, capillary     Status: Abnormal   Collection Time: 01/10/16 11:49 AM  Result Value Ref Range   Glucose-Capillary 229 (H) 65 - 99 mg/dL    Blood Alcohol level:  Lab Results  Component Value Date   ETH <5 01/08/2016   ETH <5 04/05/2015    Physical Findings: AIMS:  , ,  ,  ,    CIWA:  CIWA-Ar Total: 10 COWS:  COWS Total Score: 11  Musculoskeletal: Strength & Muscle Tone: within normal limits Gait & Station: unsteady Patient leans: N/A  Psychiatric Specialty Exam: Physical Exam  ROS  Blood pressure 117/90, pulse 108, temperature 98.4 F (36.9 C), temperature source Oral, resp. rate 16, height 6' (1.829 m), weight 68.04 kg (150 lb), SpO2 100 %.Body mass index is 20.34  kg/(m^2).  General Appearance: Disheveled  Eye Contact:  Fair  Speech:  Clear and Coherent  Volume:  Decreased  Mood:  Dysphoric and Hopeless  Affect:  Constricted  Thought Process:  Coherent  Orientation:  Full (Time, Place, and Person)  Thought Content:  Symptoms, worries, concerns  Suicidal Thoughts:  Yes.  with intent/plan  Homicidal Thoughts:  No  Memory:  Immediate;   Good Recent;   Good Remote;   Fair  Judgement:  Poor  Insight:  Shallow  Psychomotor Activity:  Decreased  Concentration:  Concentration: Fair and Attention Span: Fair  Recall:  [place] of Knowledge:  Good  Language:  Good  Akathisia:  No  Handed:  Right  AIMS (if indicated):     Assets:  Communication Skills Desire for Improvement Leisure Time Resilience  ADL's:  Intact  Cognition:  WNL  Sleep:        Treatment Plan Summary: Daily contact with patient to assess and evaluate symptoms and progress in treatment and Medication management   -Start Abilify 5 mg daily for mood stabilization  -Continue Zoloft 100 mg daily for depression -Continue medical medications as ordered -Adjusted insulin per Diabetes Coordinator recommendations to include moderate novolog SSI with glucose checks achs, Lantus insulin 20 units starting tomorrow.  -Continue to monitor overnight in the [place] Unit due to active suicidal ideation with plan   Elmarie Shiley, NP 01/10/2016, 3:19 PM

## 2016-01-10 NOTE — BH Assessment (Signed)
Spoke with pt. Mother Fatima BlankMAry Koplin. [facility] and she asked if was ok and thanked us for contacting hers stated has not heard from pt. For a while. Mother confirmed that once d/c can contact her and that pt. Will stay with her. Mother grateful and appreciative.  Bekim Werntz K. Sherlon HandingHarris, LCAS-A, LPC-A, [facility]  Counselor 01/10/2016 1:27 PM

## 2016-01-10 NOTE — BHH Counselor (Signed)
Contacted ACCT crisis hotline and spoke with Charlott RakesJack Bolick who was aware that pt,. [facility], and stated that upon d/c ACCT team will follow up with pt. Also, stated that pt. Mother should be contacted. This counselor spoke with pt. Pt agreed and added mother to consent list.  Hesston Hitchens K. Janat Tabbert, LCAS-A, LPC-A, [facility]  Counselor 01/10/2016 1:16 PM

## 2016-01-11 DIAGNOSIS — F1424 Cocaine dependence with cocaine-induced mood disorder: Secondary | ICD-10-CM | POA: Diagnosis not present

## 2016-01-11 LAB — GLUCOSE, CAPILLARY
GLUCOSE-CAPILLARY: 310 mg/dL — AB (ref 65–99)
GLUCOSE-CAPILLARY: 167 mg/dL — AB (ref 65–99)

## 2016-01-11 MED ORDER — ARIPIPRAZOLE 2 MG PO TABS
2.0000 mg | ORAL_TABLET | Freq: Every day | ORAL | Status: DC
Start: 2016-01-12 — End: 2016-09-15

## 2016-01-11 MED ORDER — BUPROPION HCL ER (XL) 300 MG PO TB24: 300.0000 mg | ORAL_TABLET | Freq: Every day | ORAL | Status: DC

## 2016-01-11 MED ORDER — INSULIN DEGLUDEC 200 UNIT/ML ~~LOC~~ SOPN: 60.0000 [IU] | PEN_INJECTOR | Freq: Every day | SUBCUTANEOUS | Status: DC

## 2016-01-11 MED ORDER — ARIPIPRAZOLE 2 MG PO TABS: 2.0000 mg | ORAL_TABLET | Freq: Every day | ORAL | Status: DC

## 2016-01-11 MED ORDER — METFORMIN HCL 1000 MG PO TABS: 1000.0000 mg | ORAL_TABLET | Freq: Two times a day (BID) | ORAL | Status: DC

## 2016-01-11 MED ORDER — SERTRALINE HCL 100 MG PO TABS: 100.0000 mg | ORAL_TABLET | Freq: Every day | ORAL | Status: DC

## 2016-01-11 MED ORDER — INSULIN ASPART 100 UNIT/ML ~~LOC~~ SOLN: 0.0000 [IU] | Freq: Three times a day (TID) | SUBCUTANEOUS | Status: DC

## 2016-01-11 MED ORDER — EZETIMIBE 10 MG PO TABS: 10.0000 mg | ORAL_TABLET | Freq: Every day | ORAL | Status: DC

## 2016-01-11 MED ORDER — BUDESONIDE-FORMOTEROL FUMARATE 80-4.5 MCG/ACT IN AERO: 2.0000 | INHALATION_SPRAY | Freq: Two times a day (BID) | RESPIRATORY_TRACT | Status: DC

## 2016-01-11 MED ORDER — GABAPENTIN 300 MG PO CAPS: 300.0000 mg | ORAL_CAPSULE | Freq: Three times a day (TID) | ORAL | Status: DC

## 2016-01-11 MED ORDER — SAXAGLIPTIN HCL 5 MG PO TABS: 5.0000 mg | ORAL_TABLET | Freq: Every day | ORAL | Status: DC

## 2016-01-11 MED ORDER — VITAMIN B-12 1000 MCG PO TABS: 1000.0000 ug | ORAL_TABLET | Freq: Every day | ORAL | Status: DC

## 2016-01-11 MED ORDER — LOSARTAN POTASSIUM 25 MG PO TABS: 25.0000 mg | ORAL_TABLET | Freq: Every day | ORAL | Status: DC

## 2016-01-11 MED ORDER — THIAMINE HCL 100 MG PO TABS: 100.0000 mg | ORAL_TABLET | Freq: Every day | ORAL | Status: DC

## 2016-01-11 MED ORDER — OXYBUTYNIN CHLORIDE ER 10 MG PO TB24: 10.0000 mg | ORAL_TABLET | Freq: Every day | ORAL | Status: DC

## 2016-01-11 MED ORDER — ASPIRIN EC 81 MG PO TBEC: 81.0000 mg | DELAYED_RELEASE_TABLET | Freq: Every day | ORAL | Status: DC

## 2016-01-11 MED ORDER — OMEPRAZOLE 40 MG PO CPDR: 40.0000 mg | DELAYED_RELEASE_CAPSULE | Freq: Every day | ORAL | Status: DC

## 2016-01-11 MED ORDER — VITAMIN D 1000 UNITS PO TABS: 1000.0000 [IU] | ORAL_TABLET | Freq: Every day | ORAL | Status: DC

## 2016-01-11 MED ORDER — MELOXICAM 15 MG PO TABS: 15.0000 mg | ORAL_TABLET | Freq: Every day | ORAL | Status: DC

## 2016-01-11 NOTE — Progress Notes (Signed)
Patient is feeling better today, and more coherent. Encouraging patient to walk around every couple of hours so that his legs wont feel wobbly and stiff. Patient is pleasant but sad. He still endorse passive SI without plan. He states that he is still feeling depressed and hopeless. Patient denies HI or AVH.    Patient is monitored through constant observation, education, support, and encouragment.   Patient is pleasant, cooperative, and receptive, will continue to monitor.

## 2016-01-11 NOTE — Progress Notes (Signed)
D: Patient in bed watching TV. Denies pain, SI,AH/VH at this time. Endorses mild depression. No record of diarrhea thus far. Patient made no new complaint and no behavioral issues noted.  A: Staff offered support and encouragement. Offered meds as ordered. Safety maintained by constant observation/monitoring.  R: Patient is receptive and cooperative.

## 2016-01-11 NOTE — Discharge Instructions (Signed)
You are encouraged to continue to keep in touch with your ACCT team for continuation of care.

## 2016-01-11 NOTE — Discharge Summary (Signed)
[facility]-Observation Unit Discharge Summary Note  Patient:  Wayne [name] is an 40 y.o., male MRN:  [id number] DOB:  [birth year] Patient phone:  [phone] (home)  Patient address:   [address] [place] [address],  Total Time spent with patient: 30 minutes  Date of Admission:  01/09/2016 Date of Discharge: 01/11/2016  Reason for Admission:  Substance abuse, Suicidal thoughts  Principal Problem: Cocaine dependence with cocaine-induced mood disorder [facility]) Discharge Diagnoses: Patient Active Problem List   Diagnosis Date Noted  . Cocaine dependence with cocaine-induced mood disorder (HCC) [F14.24] 01/09/2016  . Protein-calorie malnutrition, severe [E43] 12/02/2015  . Diabetes mellitus with nonketotic hyperosmolarity (HCC) [E11.00] 11/30/2015  . Major depressive disorder, recurrent severe without psychotic features (HCC) [F33.2]   . Onychomycosis [B35.1] 01/04/2014  . Ingrown nail [L60.0] 11/12/2013  . Onychia and paronychia of toe [L03.039] 11/12/2013  . Deformity of metatarsal bone of right foot [M21.961] 08/17/2013  . Low TSH level [R94.6] 07/29/2011  . Peripheral neuropathy (HCC) [G62.9] 02/01/2011  . GERD [K21.9] 07/10/2010  . OBSTRUCTIVE SLEEP APNEA [G47.33] 09/26/2009  . IBS [K58.9] 06/10/2009  . SCIATICA, RIGHT [M54.30] 06/02/2009  . SUBSTANCE ABUSE, MULTIPLE [F19.10] 01/10/2009  . LUNG NODULE [J98.4] 12/04/2007  . HYPOGONADISM, MALE [E29.1] 10/09/2007  . ERECTILE DYSFUNCTION [F52.8] 09/14/2007  . DIABETES MELLITUS, TYPE II [E11.9] 04/12/2007  . HYPERLIPIDEMIA [E78.5] 04/12/2007  . Depression, major, recurrent (HCC) [F33.9] 04/12/2007  . Essential hypertension [I10] 04/12/2007  . OSTEOARTHRITIS [M19.90] 04/12/2007    Past Psychiatric History: depression, substance abuse   Past Medical History:  Past Medical History  Diagnosis Date  . Depression   . Diabetes mellitus   . Hyperlipidemia   . HTN (hypertension)   . Chronic headaches   . Osteoarthritis   . Kidney  stones   . Polysubstance abuse     cocaine, marijuana-quit summer 2010  . Tobacco abuse   . Asthma   . Chest pain   . Lumbar disc disease   . GERD (gastroesophageal reflux disease)   . OSA (obstructive sleep apnea)   . Peripheral neuropathy (HCC) 02/01/2011  . Bipolar 1 disorder (HCC)   . Ingrown nail 11/12/2013    Past Surgical History  Procedure Laterality Date  . Knee surgery    . Bunionectomy  06-2009   Family History:  Family History  Problem Relation Age of Onset  . Heart disease Mother   . Diabetes Father   . Hyperlipidemia Father   . Emphysema Maternal Uncle   . Asthma Sister   . Asthma Mother   . Stroke Mother   . Hypertension Father   . Alzheimer's disease Maternal Grandmother   . Depression Father   . Breast cancer Maternal Aunt   . COPD Mother     maternal un   Family Psychiatric  History: See H & P Social History:  History  Alcohol Use No    Comment: socially     History  Drug Use  . Yes  . Special: Cocaine    Comment: former cocaine and use    Social History   Social History  . Marital Status: Single    Spouse Name: N/A  . Number of Children: 0  . Years of Education: N/A   Occupational History  . student and landscape    Social History Main Topics  . Smoking status: Current Some Day Smoker -- 0.20 packs/day for 17 years    Types: Cigarettes    Last Attempt to Quit: 02/24/2011  . Smokeless  tobacco: Former NeurosurgeonUser  . Alcohol Use: No     Comment: socially  . Drug Use: Yes    Special: Cocaine     Comment: former cocaine and use  . Sexual Activity: Not Asked   Other Topics Concern  . None   Social History Narrative    Hospital Course:    Wayne [name] is a 40 yo male [name] and initially denied suicidal ideations. Today, he just wants to "sleep" as he is tired. He denies homicidal ideations, hallucinations, and withdrawal symptoms. He expresses passive suicidal ideations to "jump off a  bridge or run out in front of a car". Mr. Caryn [name] is under the care of [facility] team who has not been able to find him lately, notified them. Patient reports that he has been non-compliant with both his medical and psychiatric medications. The patient reports not having had any insulin for the past seven months. Patient was very difficult to engage in assessment for unclear reasons possibly fatigue. Patient states "I don't feel like living. I stole from my family so they have nothing to do with me. I use cocaine about four times per week. I've been homeless for several months. I only eat a few times per week. I'm depressed and suicidal."  [facility]. He was restarted on his psychiatric medications for depression to include zoloft and wellbutrin. Patient continued to report depression with suicidal ideation reporting that abilify had helped to stabilize his mood in the past. His diabetes was also managed during his stay with lantus insulin and novolog meal coverage. Patient had been non-compliant with medical and psychiatric treatment for several months prior to admission. His mother was contacted by the counselor who was happy to know about the patient's whereabouts as patient stated "I think she thought that I was dead. I had been living in a [place] and not communicating with anyone." Patient's mother reported that he would be able to stay with her post discharge and his ACT team was also contacted. Wayne [name]'s depressive symptoms improved during his stay and he became more future oriented. Patient talked with Clinical research associatewriter about getting a job and getting some past legal charges dealt with. His affect was much brighter and he was easy to engage in conversation. On the day of discharge the patient denied any suicidal ideation and reported feeling stable for discharge. Patient was provided with prescription for his psychiatric medication. Discussed the importance of making  an appointment with his Primary Care MD for diabetes management. Patient stated "I [facility]. I need to do better about taking care of myself. My neuropathy is getting worse in my hands." Patient left [facility] in stable condition with all belongings returned to him. The counselor reported to Clinical research associatewriter that an ACCT team representative would be able to pick the patient up at around 12:30 pm. He expressed desire to stop using drugs and to "make something of myself moving forward."    Physical Findings: AIMS:  , ,  ,  ,    CIWA:  CIWA-Ar Total: 5 COWS:  COWS Total Score: 4  Musculoskeletal: Strength & Muscle Tone: within normal limits Gait & Station: normal Patient leans: N/A  Psychiatric Specialty Exam: Physical Exam  Review of Systems  Psychiatric/Behavioral: Positive for depression (Stable ) and substance abuse (Positive for cocaine on admission ). Negative for suicidal ideas,  hallucinations and memory loss. The patient is not nervous/anxious and does not have insomnia.     Blood pressure 128/89, pulse 93, temperature 97.8 F (36.6 C), temperature source Oral, resp. rate 16, height 6' (1.829 m), weight 68.04 kg (150 lb), SpO2 100 %.Body mass index is 20.34 kg/(m^2).  General Appearance: Casual  Eye Contact:  Good  Speech:  Clear and Coherent  Volume:  Normal  Mood:  Euthymic  Affect:  Appropriate  Thought Process:  Coherent and Goal Directed  Orientation:  Full (Time, Place, and Person)  Thought Content:  WDL  Suicidal Thoughts:  No  Homicidal Thoughts:  No  Memory:  Immediate;   Good Recent;   Good Remote;   Good  Judgement:  Fair  Insight:  Present  Psychomotor Activity:  Normal  Concentration:  Concentration: Good and Attention Span: Good  Recall:  Good  Fund of Knowledge:  Good  Language:  Good  Akathisia:  No  Handed:  Right  AIMS (if indicated):     Assets:  Communication Skills Desire for  Improvement Financial Resources/Insurance Housing Intimacy Resilience Talents/Skills  ADL's:  Intact  Cognition:  WNL  Sleep:           Has this patient used any form of tobacco in the last 30 days? (Cigarettes, Smokeless Tobacco, Cigars, and/or Pipes) Yes, Yes, A prescription for an FDA-approved tobacco cessation medication was offered at discharge and the patient refused  Blood Alcohol level:  Lab Results  Component Value Date   [facility] <5 01/08/2016   ETH <5 04/05/2015    Metabolic Disorder Labs:  Lab Results  Component Value Date   HGBA1C 15.3* 12/01/2015   MPG 309* 06/18/2013   MPG 266* 07/24/2011   No results found for: PROLACTIN Lab Results  Component Value Date   CHOL 161 04/19/2011   TRIG 40.0 04/19/2011   HDL 52.40 04/19/2011   CHOLHDL 3 04/19/2011   VLDL 8.0 04/19/2011   LDLCALC 101* 04/19/2011   LDLCALC 122* 02/01/2011    Discharge destination:  Home  Is patient on multiple antipsychotic therapies at discharge:  No   Has Patient had three or more failed trials of antipsychotic monotherapy by history:  No  Recommended Plan for Multiple Antipsychotic Therapies: NA      Discharge Instructions    Diet - low sodium heart healthy    Complete by:  As directed      Discharge instructions    Complete by:  As directed   Discussed with patient regarding the importance of following up with his Primary Care Physician reported to be "Dr. Clovis Riley at [facility] on [address]" for management of his Diabetes as soon as possible. Patient reported being off his insulin for seven months prior to admission and has had elevated blood sugars daily.            Medication List    STOP taking these medications        pantoprazole 40 MG tablet  Commonly known as:  PROTONIX      TAKE these medications      Indication   albuterol 108 (90 Base) MCG/ACT inhaler  Commonly known as:  PROVENTIL HFA;VENTOLIN HFA  Inhale 2 puffs into the lungs every 6 (six) hours as  needed for wheezing or shortness of breath.      ARIPiprazole 2 MG tablet  Commonly known as:  ABILIFY  Take 1 tablet (2 mg total) by mouth daily.  Start taking on:  01/12/2016  Indication:  Major Depressive Disorder     aspirin EC 81 MG tablet  Take 1 tablet (81 mg total) by mouth daily.   Indication:  Inflammation     budesonide-formoterol 80-4.5 MCG/ACT inhaler  Commonly known as:  SYMBICORT  Inhale 2 puffs into the lungs 2 (two) times daily.   Indication:  Asthma     buPROPion 300 MG 24 hr tablet  Commonly known as:  WELLBUTRIN XL  Take 1 tablet (300 mg total) by mouth daily.   Indication:  Major Depressive Disorder     cholecalciferol 1000 units tablet  Commonly known as:  VITAMIN D  Take 1 tablet (1,000 Units total) by mouth daily.   Indication:  Vitamin D Supplementation     ezetimibe 10 MG tablet  Commonly known as:  ZETIA  Take 1 tablet (10 mg total) by mouth daily.   Indication:  Elevation of Both Cholesterol and Triglycerides in Blood     gabapentin 300 MG capsule  Commonly known as:  NEURONTIN  Take 1 capsule (300 mg total) by mouth 3 (three) times daily.   Indication:  Neuropathic Pain, Mood control     insulin aspart 100 UNIT/ML injection  Commonly known as:  novoLOG  Inject 0-30 Units into the skin 3 (three) times daily before meals. Dose per sliding scale.   Indication:  Type 2 Diabetes     Insulin Degludec 200 UNIT/ML Sopn  Commonly known as:  TRESIBA FLEXTOUCH  Inject 60 Units into the skin daily.   Indication:  Diabetes     loratadine 10 MG tablet  Commonly known as:  CLARITIN  Take 10 mg by mouth daily as needed for allergies.      losartan 25 MG tablet  Commonly known as:  COZAAR  Take 1 tablet (25 mg total) by mouth daily.   Indication:  High Blood Pressure     meloxicam 15 MG tablet  Commonly known as:  MOBIC  Take 1 tablet (15 mg total) by mouth daily.   Indication:  Joint Damage causing Pain and Loss of Function     metFORMIN 1000  MG tablet  Commonly known as:  GLUCOPHAGE  Take 1 tablet (1,000 mg total) by mouth 2 (two) times daily with a meal.   Indication:  Type 2 Diabetes     methocarbamol 500 MG tablet  Commonly known as:  ROBAXIN  Take 500 mg by mouth every 8 (eight) hours as needed for muscle spasms.      omeprazole 40 MG capsule  Commonly known as:  PRILOSEC  Take 1 capsule (40 mg total) by mouth daily.   Indication:  Gastroesophageal Reflux Disease     oxybutynin 10 MG 24 hr tablet  Commonly known as:  DITROPAN-XL  Take 1 tablet (10 mg total) by mouth at bedtime.   Indication:  Frequent Urination     saxagliptin HCl 5 MG Tabs tablet  Commonly known as:  ONGLYZA  Take 1 tablet (5 mg total) by mouth daily.   Indication:  Type 2 Diabetes     sertraline 100 MG tablet  Commonly known as:  ZOLOFT  Take 1 tablet (100 mg total) by mouth at bedtime.   Indication:  Major Depressive Disorder     thiamine 100 MG tablet  Take 1 tablet (100 mg total) by mouth daily.   Indication:  Deficiency in Thiamine or Vitamin B1     traMADol 50 MG tablet  Commonly known as:  ULTRAM  Take 50 mg  by mouth every 6 (six) hours as needed for moderate pain or severe pain (pain).      vitamin B-12 1000 MCG tablet  Commonly known as:  CYANOCOBALAMIN  Take 1 tablet (1,000 mcg total) by mouth daily. Reported on 12/08/2015   Indication:  Inadequate Vitamin B12       Follow-up Information    Call [facility] ACCT team.   Why:  As needed for crisis or to contact ACCT team.   Contact information:   [phone]      Follow-up recommendations:    As above   Comments:   Take all your medications as prescribed by your mental healthcare provider.  Report any adverse effects and or reactions from your medicines to your outpatient provider promptly.  Patient is instructed and cautioned to not engage in alcohol and or illegal drug use while on prescription medicines.  In the event of worsening symptoms, patient is instructed to  call the crisis hotline, 911 and or go to the nearest ED for appropriate evaluation and treatment of symptoms.  Follow-up with your primary care provider for your other medical issues, concerns and or health care needs.   SignedFransisca Kaufmann, NP 01/11/2016, 1:35 PM  I agree with treatment plan.

## 2016-01-11 NOTE — Progress Notes (Signed)
Patient was given discharge instructions, advised on diabetes self-care, medication scripts, and belongings were returned. Patient was discharged at 1220.

## 2016-01-11 NOTE — [facility] Counselor (Signed)
Contacted [facility] crisis line regarding pt. Disposition. [facility] to prick up pt. Around 12:30 p.m. Today for d/c.  Fenna Semel K. Sherlon HandingHarris, LCAS-A, LPC-A, [facility]  Counselor 01/11/2016 10:02 AM

## 2016-03-07 DIAGNOSIS — G6181 Chronic inflammatory demyelinating polyneuritis: Secondary | ICD-10-CM | POA: Insufficient documentation

## 2016-04-28 ENCOUNTER — Telehealth: Payer: Self-pay | Admitting: Internal Medicine

## 2016-04-28 NOTE — Telephone Encounter (Signed)
We cannot prescribe anything for him including new equipment - even if this were an emergency, which it's not, he's beyond the 30 day window for emergency care.  Even so, this is not an emergency so the best I can do is suggest we help him find a new sleep doc asap

## 2016-04-28 NOTE — Telephone Encounter (Signed)
Wayne [name]---pt has been discharged from our practice.  He is requesting that we send in a new order for him to get a cpap until he can get set up with another sleep doctor.  Please advise if this can be done. thanks

## 2016-04-29 NOTE — Telephone Encounter (Signed)
Spoke with pt. He is aware of MW's response. Pt states that he will have his PCP set him up with a new sleep doctor. Nothing further was needed.

## 2016-04-29 NOTE — Telephone Encounter (Signed)
Pt returning call.Wayne [name] ° °

## 2016-04-29 NOTE — Telephone Encounter (Signed)
lmtcb x1 for pt. 

## 2016-05-14 DIAGNOSIS — H269 Unspecified cataract: Secondary | ICD-10-CM | POA: Insufficient documentation

## 2016-07-27 DIAGNOSIS — E0590 Thyrotoxicosis, unspecified without thyrotoxic crisis or storm: Secondary | ICD-10-CM | POA: Insufficient documentation

## 2016-08-20 ENCOUNTER — Emergency Department (HOSPITAL_BASED_OUTPATIENT_CLINIC_OR_DEPARTMENT_OTHER): Payer: BLUE CROSS/BLUE SHIELD

## 2016-08-20 ENCOUNTER — Encounter (HOSPITAL_BASED_OUTPATIENT_CLINIC_OR_DEPARTMENT_OTHER): Payer: Self-pay | Admitting: *Deleted

## 2016-08-20 ENCOUNTER — Emergency Department (HOSPITAL_BASED_OUTPATIENT_CLINIC_OR_DEPARTMENT_OTHER)
Admission: EM | Admit: 2016-08-20 | Discharge: 2016-08-20 | Disposition: A | Discharge status: Home / Self Care | Payer: BLUE CROSS/BLUE SHIELD | Attending: Emergency Medicine | Admitting: Emergency Medicine

## 2016-08-20 DIAGNOSIS — Z79899 Other long term (current) drug therapy: Secondary | ICD-10-CM | POA: Insufficient documentation

## 2016-08-20 DIAGNOSIS — Z794 Long term (current) use of insulin: Secondary | ICD-10-CM | POA: Diagnosis not present

## 2016-08-20 DIAGNOSIS — F17210 Nicotine dependence, cigarettes, uncomplicated: Secondary | ICD-10-CM | POA: Insufficient documentation

## 2016-08-20 DIAGNOSIS — E1065 Type 1 diabetes mellitus with hyperglycemia: Secondary | ICD-10-CM | POA: Insufficient documentation

## 2016-08-20 DIAGNOSIS — R739 Hyperglycemia, unspecified: Secondary | ICD-10-CM

## 2016-08-20 DIAGNOSIS — S92352A Displaced fracture of fifth metatarsal bone, left foot, initial encounter for closed fracture: Secondary | ICD-10-CM | POA: Diagnosis not present

## 2016-08-20 DIAGNOSIS — Y999 Unspecified external cause status: Secondary | ICD-10-CM | POA: Diagnosis not present

## 2016-08-20 DIAGNOSIS — X58XXXA Exposure to other specified factors, initial encounter: Secondary | ICD-10-CM | POA: Insufficient documentation

## 2016-08-20 DIAGNOSIS — Z9114 Patient's other noncompliance with medication regimen: Secondary | ICD-10-CM | POA: Insufficient documentation

## 2016-08-20 DIAGNOSIS — Y939 Activity, unspecified: Secondary | ICD-10-CM | POA: Diagnosis not present

## 2016-08-20 DIAGNOSIS — S99922A Unspecified injury of left foot, initial encounter: Secondary | ICD-10-CM | POA: Diagnosis present

## 2016-08-20 DIAGNOSIS — I10 Essential (primary) hypertension: Secondary | ICD-10-CM | POA: Insufficient documentation

## 2016-08-20 DIAGNOSIS — E1165 Type 2 diabetes mellitus with hyperglycemia: Secondary | ICD-10-CM

## 2016-08-20 DIAGNOSIS — Z7982 Long term (current) use of aspirin: Secondary | ICD-10-CM | POA: Insufficient documentation

## 2016-08-20 DIAGNOSIS — Y929 Unspecified place or not applicable: Secondary | ICD-10-CM | POA: Diagnosis not present

## 2016-08-20 DIAGNOSIS — J45909 Unspecified asthma, uncomplicated: Secondary | ICD-10-CM | POA: Insufficient documentation

## 2016-08-20 HISTORY — DX: Chronic inflammatory demyelinating polyneuritis: G61.81

## 2016-08-20 LAB — BASIC METABOLIC PANEL
ANION GAP: 12 (ref 5–15)
BUN: 32 mg/dL — AB (ref 6–20)
CHLORIDE: 93 mmol/L — AB (ref 101–111)
CO2: 25 mmol/L (ref 22–32)
Calcium: 9.8 mg/dL (ref 8.9–10.3)
Creatinine, Ser: 1.42 mg/dL — ABNORMAL HIGH (ref 0.61–1.24)
GFR calc Af Amer: >60 mL/min (ref >60)
GLUCOSE: 400 mg/dL — AB (ref 65–99)
POTASSIUM: 4.8 mmol/L (ref 3.5–5.1)
Sodium: 130 mmol/L — ABNORMAL LOW (ref 135–145)

## 2016-08-20 LAB — I-STAT VENOUS BLOOD GAS, ED
Acid-Base Excess: 3 mmol/L — ABNORMAL HIGH (ref 0.0–2.0)
BICARBONATE: 30.3 mmol/L — AB (ref 20.0–28.0)
O2 SAT: 25 %
PCO2 VEN: 57.1 mmHg (ref 44.0–60.0)
TCO2: 32 mmol/L (ref 0–100)
pH, Ven: 7.331 (ref 7.250–7.430)
pO2, Ven: 19 mmHg — CL (ref 32.0–45.0)

## 2016-08-20 LAB — HEPATIC FUNCTION PANEL
ALK PHOS: 85 U/L (ref 38–126)
ALT: 18 U/L (ref 17–63)
AST: 29 U/L (ref 15–41)
Albumin: 4 g/dL (ref 3.5–5.0)
BILIRUBIN INDIRECT: 0.8 mg/dL (ref 0.3–0.9)
BILIRUBIN TOTAL: 0.9 mg/dL (ref 0.3–1.2)
Bilirubin, Direct: 0.1 mg/dL (ref 0.1–0.5)
TOTAL PROTEIN: 11.2 g/dL — AB (ref 6.5–8.1)

## 2016-08-20 LAB — CBC
HEMATOCRIT: 41 % (ref 39.0–52.0)
HEMOGLOBIN: 14.2 g/dL (ref 13.0–17.0)
MCH: 30.8 pg (ref 26.0–34.0)
MCHC: 34.6 g/dL (ref 30.0–36.0)
MCV: 88.9 fL (ref 78.0–100.0)
Platelets: 127 10*3/uL — ABNORMAL LOW (ref 150–400)
RBC: 4.61 MIL/uL (ref 4.22–5.81)
RDW: 12.1 % (ref 11.5–15.5)
WBC: 2 10*3/uL — ABNORMAL LOW (ref 4.0–10.5)

## 2016-08-20 LAB — CBG MONITORING, ED
Glucose-Capillary: 401 mg/dL — ABNORMAL HIGH (ref 65–99)
Glucose-Capillary: 319 mg/dL — ABNORMAL HIGH (ref 65–99)
Glucose-Capillary: 185 mg/dL — ABNORMAL HIGH (ref 65–99)

## 2016-08-20 MED ORDER — SODIUM CHLORIDE 0.9 % IV BOLUS (SEPSIS)
1000.0000 mL | Freq: Once | INTRAVENOUS | Status: AC
  Administered 2016-08-20: 1000 mL via INTRAVENOUS

## 2016-08-20 MED ORDER — INSULIN REGULAR HUMAN 100 UNIT/ML IJ SOLN
8.0000 [IU] | Freq: Once | INTRAMUSCULAR | Status: AC
  Administered 2016-08-20: 8 [IU] via INTRAVENOUS
  Filled 2016-08-20: qty 1

## 2016-08-20 NOTE — ED Notes (Signed)
Pt reports he has not been able to afford his insulin for 1 week. Reports increase in thirst and urination and states he knew his blood sugar was high.

## 2016-08-20 NOTE — Discharge Instructions (Signed)
Wear your Cam Walker.  Take your medications as directed.

## 2016-08-20 NOTE — ED Provider Notes (Signed)
[facility]   CSN: [id number] Arrival date & time: 08/20/16  1224     History   Chief Complaint Chief Complaint  Patient presents with  . Hyperglycemia    HPI Wayne [name] is a 41 y.o. male.  Pt presents to the ED with feeling bad.  Pt said that he is a type 1 diabetic who is supposed to be using an insulin pump.  The pt just saw his pcp on 1/16 for an insulin pump follow up.  The pt's blood sugars were downloaded and were shown to be higher than normal.  Pt said his pump has not been working correctly for the past week, and he's had trouble giving himself the insulin.  He has been very thirsty and felt like his blood sugar was high.  He has not been eating well over the past few days.  Pt also c/o pain to his left foot that has been going on for a few months.  He broke his foot and is supposed to be in a cam walker.  He has not been using it.      Past Medical History:  Diagnosis Date  . Asthma   . Bipolar 1 disorder (HCC)   . Chest pain   . Chronic headaches   . CIDP (chronic inflammatory demyelinating polyneuropathy) (HCC)   . Depression   . Diabetes mellitus   . GERD (gastroesophageal reflux disease)   . HTN (hypertension)   . Hyperlipidemia   . Ingrown nail 11/12/2013  . Kidney stones   . Lumbar disc disease   . OSA (obstructive sleep apnea)   . Osteoarthritis   . Peripheral neuropathy (HCC) 02/01/2011  . Polysubstance abuse    cocaine, marijuana-quit summer 2010  . Tobacco abuse     Patient Active Problem List   Diagnosis Date Noted  . Cocaine dependence with cocaine-induced mood disorder (HCC) 01/09/2016  . Protein-calorie malnutrition, severe 12/02/2015  . Diabetes mellitus with nonketotic hyperosmolarity (HCC) 11/30/2015  . Major depressive disorder, recurrent severe without psychotic features (HCC)   . Onychomycosis 01/04/2014  . Ingrown nail 11/12/2013  . Onychia and paronychia of toe 11/12/2013  . Deformity of metatarsal bone  of right foot 08/17/2013  . Low TSH level 07/29/2011  . Peripheral neuropathy (HCC) 02/01/2011  . GERD 07/10/2010  . OBSTRUCTIVE SLEEP APNEA 09/26/2009  . IBS 06/10/2009  . SCIATICA, RIGHT 06/02/2009  . SUBSTANCE ABUSE, MULTIPLE 01/10/2009  . LUNG NODULE 12/04/2007  . HYPOGONADISM, MALE 10/09/2007  . ERECTILE DYSFUNCTION 09/14/2007  . DIABETES MELLITUS, TYPE II 04/12/2007  . HYPERLIPIDEMIA 04/12/2007  . Depression, major, recurrent (HCC) 04/12/2007  . Essential hypertension 04/12/2007  . OSTEOARTHRITIS 04/12/2007    Past Surgical History:  Procedure Laterality Date  . BUNIONECTOMY  06-2009  . KNEE SURGERY         Home Medications    Prior to Admission medications   Medication Sig Start Date End Date Taking? Authorizing Provider  albuterol (PROVENTIL HFA;VENTOLIN HFA) 108 (90 Base) MCG/ACT inhaler Inhale 2 puffs into the lungs every 6 (six) hours as needed for wheezing or shortness of breath.    Historical Provider, MD  ARIPiprazole (ABILIFY) 2 MG tablet Take 1 tablet (2 mg total) by mouth daily. 01/12/16   Thermon Leyland, NP  aspirin EC 81 MG tablet Take 1 tablet (81 mg total) by mouth daily. 01/11/16   Thermon Leyland, NP  budesonide-formoterol (SYMBICORT) 80-4.5 MCG/ACT inhaler Inhale 2 puffs into the lungs 2 (  two) times daily. 01/11/16   Thermon Leyland, NP  buPROPion (WELLBUTRIN XL) 300 MG 24 hr tablet Take 1 tablet (300 mg total) by mouth daily. 01/11/16   Thermon Leyland, NP  cholecalciferol (VITAMIN D) 1000 units tablet Take 1 tablet (1,000 Units total) by mouth daily. 01/11/16   Thermon Leyland, NP  ezetimibe (ZETIA) 10 MG tablet Take 1 tablet (10 mg total) by mouth daily. 01/11/16   Thermon Leyland, NP  gabapentin (NEURONTIN) 300 MG capsule Take 1 capsule (300 mg total) by mouth 3 (three) times daily. 01/11/16   Thermon Leyland, NP  insulin aspart (NOVOLOG) 100 UNIT/ML injection Inject 0-30 Units into the skin 3 (three) times daily before meals. Dose per sliding scale. 01/11/16   Thermon Leyland, NP  Insulin Degludec (TRESIBA FLEXTOUCH) 200 UNIT/ML SOPN Inject 60 Units into the skin daily. 01/11/16   Thermon Leyland, NP  loratadine (CLARITIN) 10 MG tablet Take 10 mg by mouth daily as needed for allergies.     Historical Provider, MD  losartan (COZAAR) 25 MG tablet Take 1 tablet (25 mg total) by mouth daily. 01/11/16   Thermon Leyland, NP  meloxicam (MOBIC) 15 MG tablet Take 1 tablet (15 mg total) by mouth daily. 01/11/16   Thermon Leyland, NP  metFORMIN (GLUCOPHAGE) 1000 MG tablet Take 1 tablet (1,000 mg total) by mouth 2 (two) times daily with a meal. 01/11/16   Thermon Leyland, NP  methocarbamol (ROBAXIN) 500 MG tablet Take 500 mg by mouth every 8 (eight) hours as needed for muscle spasms.    Historical Provider, MD  omeprazole (PRILOSEC) 40 MG capsule Take 1 capsule (40 mg total) by mouth daily. 01/11/16   Thermon Leyland, NP  oxybutynin (DITROPAN-XL) 10 MG 24 hr tablet Take 1 tablet (10 mg total) by mouth at bedtime. 01/11/16   Thermon Leyland, NP  saxagliptin HCl (ONGLYZA) 5 MG TABS tablet Take 1 tablet (5 mg total) by mouth daily. 01/11/16   Thermon Leyland, NP  sertraline (ZOLOFT) 100 MG tablet Take 1 tablet (100 mg total) by mouth at bedtime. 01/11/16   Thermon Leyland, NP  thiamine 100 MG tablet Take 1 tablet (100 mg total) by mouth daily. 01/11/16   Thermon Leyland, NP  traMADol (ULTRAM) 50 MG tablet Take 50 mg by mouth every 6 (six) hours as needed for moderate pain or severe pain (pain).     Historical Provider, MD  vitamin B-12 (CYANOCOBALAMIN) 1000 MCG tablet Take 1 tablet (1,000 mcg total) by mouth daily. Reported on 12/08/2015 01/11/16   Thermon Leyland, NP    Family History Family History  Problem Relation Age of Onset  . Heart disease Mother   . Asthma Mother   . Stroke Mother   . COPD Mother     maternal un  . Diabetes Father   . Hyperlipidemia Father   . Hypertension Father   . Depression Father   . Emphysema Maternal Uncle   . Asthma Sister   . Alzheimer's disease Maternal  Grandmother   . Breast cancer Maternal Aunt     Social History Social History  Substance Use Topics  . Smoking status: Current Some Day Smoker    Packs/day: 0.20    Years: 17.00    Types: Cigarettes    Last attempt to quit: 02/24/2011  . Smokeless tobacco: Former Neurosurgeon  . Alcohol use No     Comment: socially     Allergies  Nystatin; Statins; Iodine; and Shellfish-derived products   Review of Systems Review of Systems  Constitutional: Positive for appetite change.  Endocrine: Positive for polydipsia and polyuria.  All other systems reviewed and are negative.    Physical Exam Updated Vital Signs BP 120/84   Pulse 101   Temp 97.8 F (36.6 C) (Oral)   Resp 11   Ht 6\' 1"  (1.854 m)   Wt 150 lb (68 kg)   SpO2 99%   BMI 19.79 kg/m   Physical Exam  Constitutional: He is oriented to person, place, and time. He appears well-developed. He appears distressed.  HENT:  Head: Normocephalic and atraumatic.  Right Ear: External ear normal.  Left Ear: External ear normal.  Nose: Nose normal.  Mouth/Throat: Mucous membranes are dry.  Eyes: Conjunctivae and EOM are normal. Pupils are equal, round, and reactive to light.  Neck: Normal range of motion. Neck supple.  Cardiovascular: Regular rhythm, normal heart sounds and intact distal pulses.  Tachycardia present.   Pulmonary/Chest: Effort normal and breath sounds normal.  Abdominal: Soft. Bowel sounds are normal.  Musculoskeletal:  Left lateral foot tenderness  Neurological: He is alert and oriented to person, place, and time.  Skin: Skin is warm.  Psychiatric: He has a normal mood and affect. His behavior is normal. Judgment and thought content normal.  Nursing note and vitals reviewed.    ED Treatments / Results  Labs (all labs ordered are listed, but only abnormal results are displayed) Labs Reviewed  BASIC METABOLIC PANEL - Abnormal; Notable for the following:       Result Value   Sodium 130 (*)    Chloride 93 (*)     Glucose, Bld 400 (*)    BUN 32 (*)    Creatinine, Ser 1.42 (*)    All other components within normal limits  CBC - Abnormal; Notable for the following:    WBC 2.0 (*)    Platelets 127 (*)    All other components within normal limits  HEPATIC FUNCTION PANEL - Abnormal; Notable for the following:    Total Protein 11.2 (*)    All other components within normal limits  CBG MONITORING, ED - Abnormal; Notable for the following:    Glucose-Capillary 401 (*)    All other components within normal limits  I-STAT VENOUS BLOOD GAS, ED - Abnormal; Notable for the following:    pO2, Ven 19.0 (*)    Bicarbonate 30.3 (*)    Acid-Base Excess 3.0 (*)    All other components within normal limits  CBG MONITORING, ED - Abnormal; Notable for the following:    Glucose-Capillary 319 (*)    All other components within normal limits  CBG MONITORING, ED - Abnormal; Notable for the following:    Glucose-Capillary 185 (*)    All other components within normal limits  BLOOD GAS, VENOUS    EKG  EKG Interpretation None       Radiology Dg Chest 2 View  Result Date: 08/20/2016 CLINICAL DATA:  Cough 1 week.  Blood sugar problems. EXAM: CHEST  2 VIEW COMPARISON:  11/30/2015 FINDINGS: Lungs are adequately inflated and otherwise clear. Cardiomediastinal silhouette, bones and soft tissues are normal. IMPRESSION: No active cardiopulmonary disease. Electronically Signed   By: Elberta Fortis M.D.   On: 08/20/2016 14:01   Dg Foot Complete Left  Result Date: 08/20/2016 CLINICAL DATA:  Left foot pain. Diabetic foot ejected no known injury. EXAM: LEFT FOOT - COMPLETE 3+ VIEW COMPARISON:  None. FINDINGS: Examination  demonstrates a displaced oblique fracture through the distal aspect of the fifth metatarsal with mild ill definition of the fracture line suggesting a subacute injury. Mild degenerate change of the first MTP joint. Small inferior calcaneal spur. IMPRESSION: Displaced oblique fracture through the distal  aspect of the fifth metatarsal likely subacute in nature. Mild degenerate change of the first MTP joint. Electronically Signed   By: Elberta Fortisaniel  Boyle M.D.   On: 08/20/2016 14:03    Procedures Procedures (including critical care time)  Medications Ordered in ED Medications  sodium chloride 0.9 % bolus 1,000 mL (0 mLs Intravenous Stopped 08/20/16 1422)  insulin regular (NOVOLIN R,HUMULIN R) 250 units/2.845mL (100 units/mL) injection 8 Units (8 Units Intravenous Given 08/20/16 1301)  sodium chloride 0.9 % bolus 1,000 mL (0 mLs Intravenous Stopped 08/20/16 1524)  insulin regular (NOVOLIN R,HUMULIN R) 250 units/2.345mL (100 units/mL) injection 8 Units (8 Units Intravenous Given 08/20/16 1421)     Initial Impression / Assessment and Plan / ED Course  I have reviewed the triage vital signs and the nursing notes.  Pertinent labs & imaging results that were available during my care of the patient were reviewed by me and considered in my medical decision making (see chart for details).    Pt said that he has insulin at home.  He said he forgets to take care of himself sometimes.  He is encouraged to take his meds as directed. He has his cam walker at home.  He is encouraged to wear it until he sees ortho.  His pcp has set him up for ortho.  Pt knows to return if worse.  Final Clinical Impressions(s) / ED Diagnoses   Final diagnoses:  Hyperglycemia  Poorly controlled diabetes mellitus (HCC)  Noncompliance with medications  Closed displaced fracture of fifth metatarsal bone of left foot, initial encounter    New Prescriptions New Prescriptions   No medications on file     Jacalyn LefevreJulie Shyam Dawson, MD 08/20/16 1526

## 2016-08-20 NOTE — ED Triage Notes (Signed)
Weakness, states he is having kidney pain. He was diagnosed with a broken foot by his MD last week but has not been wearing the boot given to him by his MD. C/o foot pain.

## 2016-09-13 IMAGING — CR DG FOOT COMPLETE 3+V*R*
3 series · 3 of 3 positions shown · non-contrast
Comparison: 03/31/2014

CLINICAL DATA: Ulcer on the plantar surface of the right foot for 1
year. Multiple previous debridement procedures, last being 8 days
ago. History of diabetes.

EXAM:
RIGHT FOOT COMPLETE - 3+ VIEW

[x foot ap right]
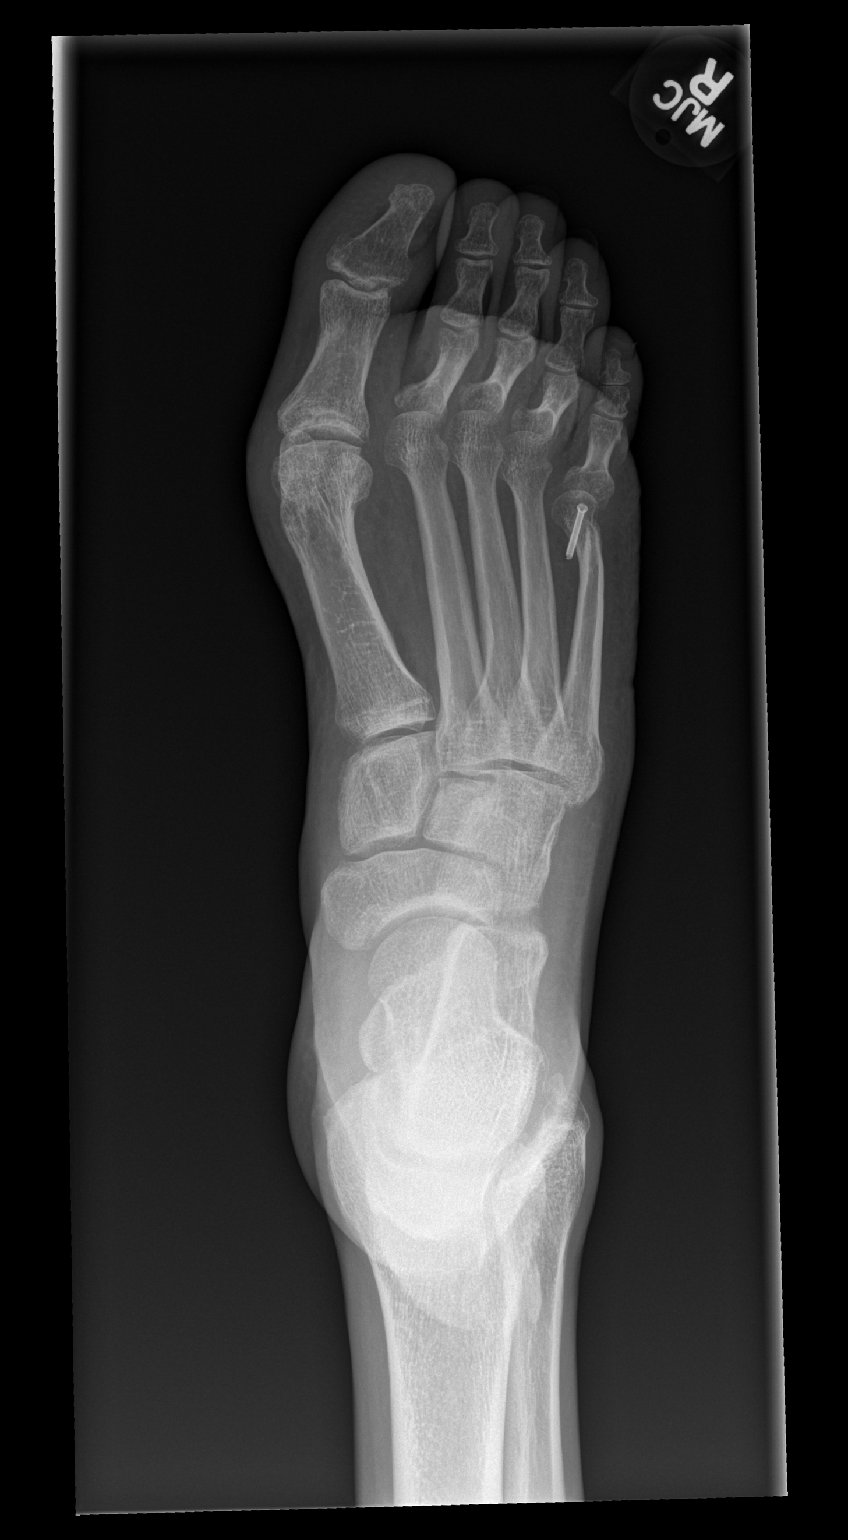

[x foot obl right]
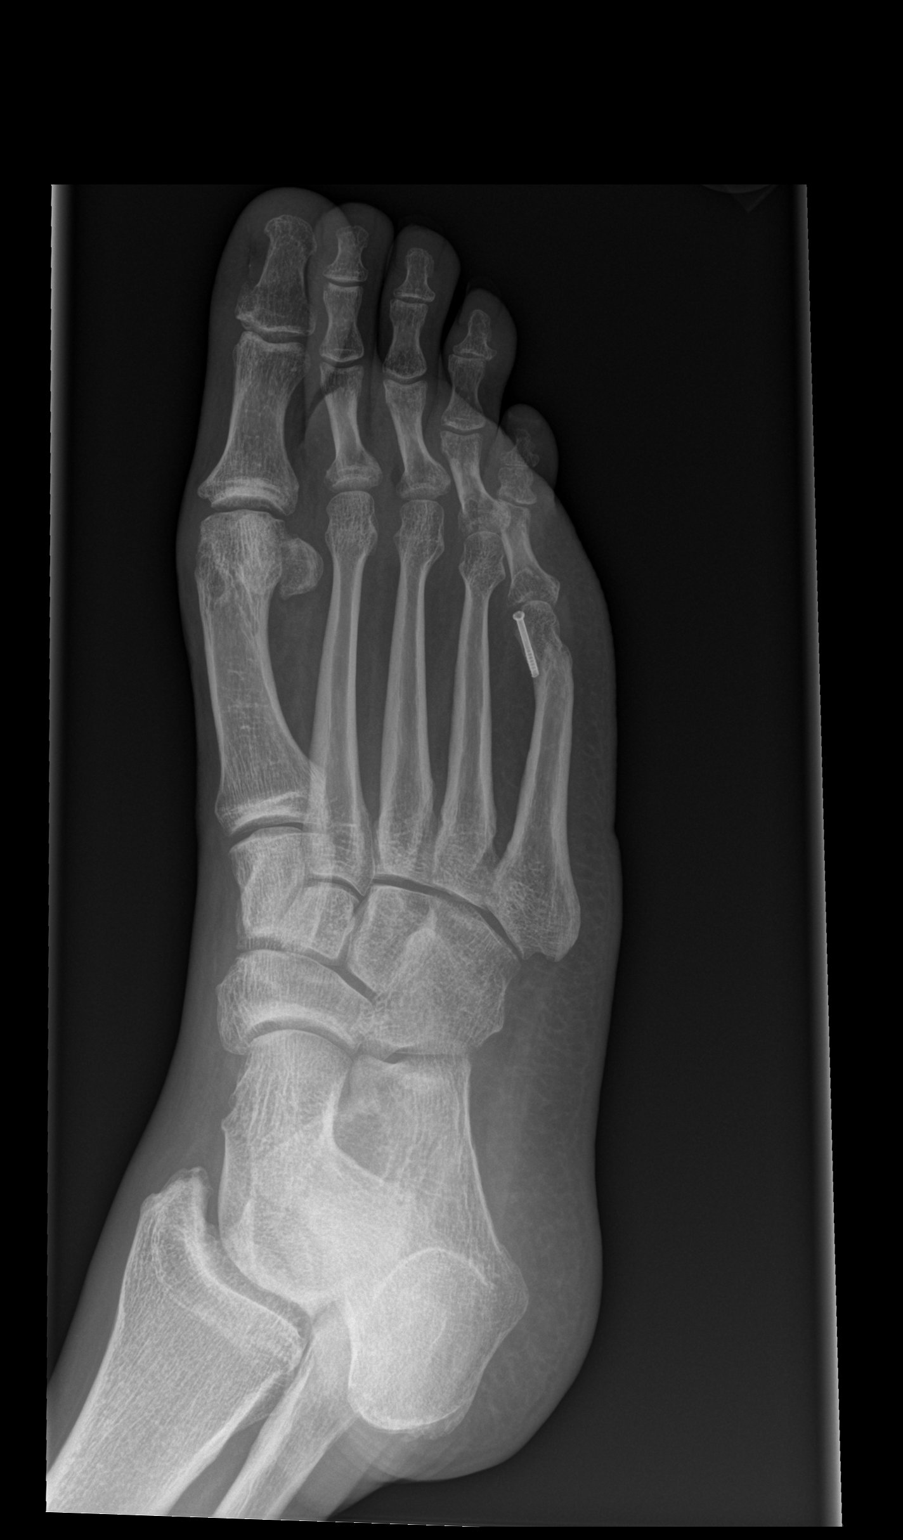

[x foot lat right]
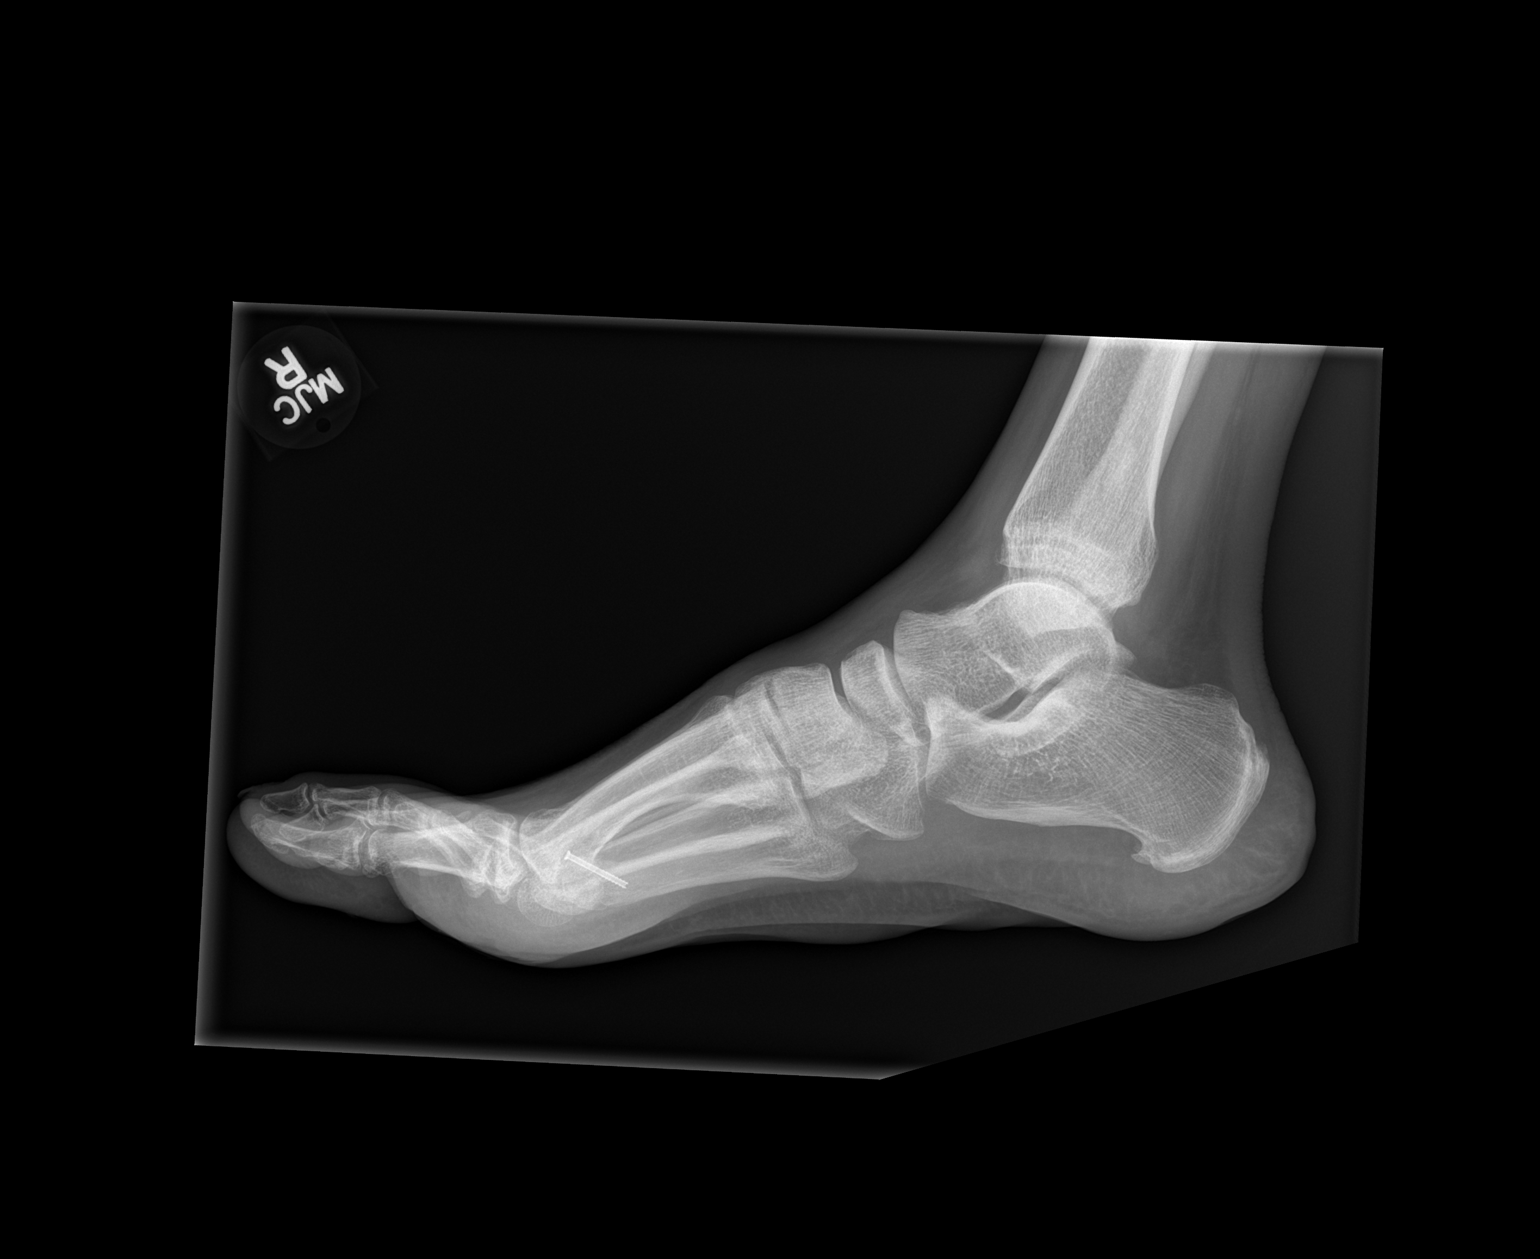

[3 of 3 positions shown; findings below may reference images not displayed]

FINDINGS: Postoperative changes with left fifth metatarsal osteotomy and screw
fixation. Probable postoperative changes in the first metatarsal
head. Mild degenerative changes at first metatarsal-phalangeal joint
and distal interphalangeal joint. No evidence of acute fracture or
dislocation. No focal bone lesion or bone destruction. Bone cortex
appears intact. No soft tissue gas collections.
IMPRESSION: Degenerative changes and postoperative changes in the right foot as
described. No acute bony abnormality or soft tissue abnormality
demonstrated. No change since previous study.

## 2016-09-15 ENCOUNTER — Encounter (HOSPITAL_COMMUNITY): Payer: Self-pay | Admitting: Emergency Medicine

## 2016-09-15 ENCOUNTER — Emergency Department (HOSPITAL_COMMUNITY)
Admission: EM | Admit: 2016-09-15 | Discharge: 2016-09-16 | Disposition: A | Discharge status: Home / Self Care | Payer: BLUE CROSS/BLUE SHIELD | Attending: Emergency Medicine | Admitting: Emergency Medicine

## 2016-09-15 DIAGNOSIS — R45850 Homicidal ideations: Secondary | ICD-10-CM | POA: Diagnosis not present

## 2016-09-15 DIAGNOSIS — E1165 Type 2 diabetes mellitus with hyperglycemia: Secondary | ICD-10-CM | POA: Diagnosis not present

## 2016-09-15 DIAGNOSIS — E1140 Type 2 diabetes mellitus with diabetic neuropathy, unspecified: Secondary | ICD-10-CM | POA: Insufficient documentation

## 2016-09-15 DIAGNOSIS — I10 Essential (primary) hypertension: Secondary | ICD-10-CM | POA: Diagnosis not present

## 2016-09-15 DIAGNOSIS — R739 Hyperglycemia, unspecified: Secondary | ICD-10-CM

## 2016-09-15 DIAGNOSIS — J45909 Unspecified asthma, uncomplicated: Secondary | ICD-10-CM | POA: Insufficient documentation

## 2016-09-15 DIAGNOSIS — Z79899 Other long term (current) drug therapy: Secondary | ICD-10-CM | POA: Diagnosis not present

## 2016-09-15 DIAGNOSIS — F17210 Nicotine dependence, cigarettes, uncomplicated: Secondary | ICD-10-CM | POA: Diagnosis not present

## 2016-09-15 DIAGNOSIS — F419 Anxiety disorder, unspecified: Secondary | ICD-10-CM | POA: Diagnosis present

## 2016-09-15 LAB — URINALYSIS, ROUTINE W REFLEX MICROSCOPIC
BILIRUBIN URINE: NEGATIVE
Bacteria, UA: NONE SEEN
Glucose, UA: >=500 mg/dL — AB
Ketones, ur: 20 mg/dL — AB
LEUKOCYTES UA: NEGATIVE
NITRITE: NEGATIVE
PH: 5 (ref 5.0–8.0)
Protein, ur: NEGATIVE mg/dL
SPECIFIC GRAVITY, URINE: 1.033 — AB (ref 1.005–1.030)
Squamous Epithelial / LPF: NONE SEEN

## 2016-09-15 LAB — CBG MONITORING, ED
GLUCOSE-CAPILLARY: 312 mg/dL — AB (ref 65–99)
GLUCOSE-CAPILLARY: 280 mg/dL — AB (ref 65–99)
GLUCOSE-CAPILLARY: 214 mg/dL — AB (ref 65–99)
Glucose-Capillary: 428 mg/dL — ABNORMAL HIGH (ref 65–99)
Glucose-Capillary: 326 mg/dL — ABNORMAL HIGH (ref 65–99)

## 2016-09-15 LAB — CBC WITH DIFFERENTIAL/PLATELET
BASOS ABS: 0 10*3/uL (ref 0.0–0.1)
BASOS PCT: 0 %
EOS PCT: 1 %
Eosinophils Absolute: 0 10*3/uL (ref 0.0–0.7)
HCT: 34.7 % — ABNORMAL LOW (ref 39.0–52.0)
Hemoglobin: 11.7 g/dL — ABNORMAL LOW (ref 13.0–17.0)
Lymphocytes Relative: 25 %
Lymphs Abs: 0.9 10*3/uL (ref 0.7–4.0)
MCH: 30 pg (ref 26.0–34.0)
MCHC: 33.7 g/dL (ref 30.0–36.0)
MCV: 89 fL (ref 78.0–100.0)
MONO ABS: 0.4 10*3/uL (ref 0.1–1.0)
Monocytes Relative: 10 %
Neutro Abs: 2.3 10*3/uL (ref 1.7–7.7)
Neutrophils Relative %: 64 %
PLATELETS: 122 10*3/uL — AB (ref 150–400)
RBC: 3.9 MIL/uL — ABNORMAL LOW (ref 4.22–5.81)
RDW: 13.3 % (ref 11.5–15.5)
WBC: 3.5 10*3/uL — ABNORMAL LOW (ref 4.0–10.5)

## 2016-09-15 LAB — RAPID URINE DRUG SCREEN, HOSP PERFORMED
Amphetamines: NOT DETECTED
BARBITURATES: NOT DETECTED
Benzodiazepines: NOT DETECTED
Cocaine: POSITIVE — AB
Opiates: NOT DETECTED
TETRAHYDROCANNABINOL: NOT DETECTED

## 2016-09-15 LAB — COMPREHENSIVE METABOLIC PANEL
ALBUMIN: 3.8 g/dL (ref 3.5–5.0)
ALK PHOS: 74 U/L (ref 38–126)
ALT: 18 U/L (ref 17–63)
AST: 23 U/L (ref 15–41)
Anion gap: 8 (ref 5–15)
BILIRUBIN TOTAL: 0.9 mg/dL (ref 0.3–1.2)
BUN: 16 mg/dL (ref 6–20)
CALCIUM: 9.3 mg/dL (ref 8.9–10.3)
CO2: 27 mmol/L (ref 22–32)
Chloride: 103 mmol/L (ref 101–111)
Creatinine, Ser: 1.09 mg/dL (ref 0.61–1.24)
GFR calc Af Amer: >60 mL/min (ref >60)
GLUCOSE: 427 mg/dL — AB (ref 65–99)
Potassium: 4 mmol/L (ref 3.5–5.1)
Sodium: 138 mmol/L (ref 135–145)
TOTAL PROTEIN: 7.7 g/dL (ref 6.5–8.1)

## 2016-09-15 LAB — ETHANOL: Alcohol, Ethyl (B): <5 mg/dL (ref <5)

## 2016-09-15 LAB — SALICYLATE LEVEL

## 2016-09-15 LAB — ACETAMINOPHEN LEVEL

## 2016-09-15 MED ORDER — ONDANSETRON HCL 4 MG PO TABS: 4.0000 mg | ORAL_TABLET | Freq: Three times a day (TID) | ORAL | Status: DC | PRN

## 2016-09-15 MED ORDER — ACETAMINOPHEN 325 MG PO TABS
650.0000 mg | ORAL_TABLET | ORAL | Status: DC | PRN
  Administered 2016-09-16: 650 mg via ORAL
  Filled 2016-09-15: qty 2

## 2016-09-15 MED ORDER — INSULIN ASPART 100 UNIT/ML ~~LOC~~ SOLN
0.0000 [IU] | SUBCUTANEOUS | Status: DC
  Administered 2016-09-15: 5 [IU] via SUBCUTANEOUS
  Administered 2016-09-15: 8 [IU] via SUBCUTANEOUS
  Administered 2016-09-16 (×3): 3 [IU] via SUBCUTANEOUS
  Filled 2016-09-15 (×5): qty 1

## 2016-09-15 MED ORDER — NICOTINE 21 MG/24HR TD PT24: 21.0000 mg | MEDICATED_PATCH | Freq: Every day | TRANSDERMAL | Status: DC

## 2016-09-15 MED ORDER — SODIUM CHLORIDE 0.9 % IV BOLUS (SEPSIS)
1000.0000 mL | Freq: Once | INTRAVENOUS | Status: AC
  Administered 2016-09-15: 1000 mL via INTRAVENOUS

## 2016-09-15 MED ORDER — ZOLPIDEM TARTRATE 5 MG PO TABS: 5.0000 mg | ORAL_TABLET | Freq: Every evening | ORAL | Status: DC | PRN

## 2016-09-15 MED ORDER — ARIPIPRAZOLE ER 400 MG IM PRSY: 400.0000 mg | PREFILLED_SYRINGE | INTRAMUSCULAR | Status: DC

## 2016-09-15 MED ORDER — ALUM & MAG HYDROXIDE-SIMETH 200-200-20 MG/5ML PO SUSP: 30.0000 mL | ORAL | Status: DC | PRN

## 2016-09-15 MED ORDER — IBUPROFEN 200 MG PO TABS
600.0000 mg | ORAL_TABLET | Freq: Three times a day (TID) | ORAL | Status: DC | PRN
Start: 2016-09-15 — End: 2016-09-16
  Administered 2016-09-16: 600 mg via ORAL
  Filled 2016-09-15 (×2): qty 3

## 2016-09-15 NOTE — ED Provider Notes (Signed)
[facility]-EMERGENCY DEPT Provider Note   CSN: [id number] Arrival date & time: 09/15/16  1247     History   Chief Complaint Chief Complaint  Patient presents with  . Anxiety  . Hyperglycemia    HPI Wayne [name] is a 41 y.o. male.  Wayne [name] is a 41 y.o. Male with history of uncontrolled diabetes, polysubstance abuse, bipolar disorder and depression who presents to the emergency department requesting rehabilitation for cocaine use and hyperglycemia. Patient is diabetic and has not been taking his medications. He also reports he's been off of his psychiatric medications for an undetermined amount of time. He tells me he's been using cocaine and last used cocaine this morning. He tells me he has homicidal towards the people who seldom cocaine. Has suicidal ideations. He denies specific plan on how to harm the people who sell him cocaine. Patient also reports he has auditory hallucinations with voices telling him to hurt himself. He denies visual hallucinations. He denies physical complaints. He denies fevers, abdominal pain, nausea, vomiting, coughing, shortness of breath, or rashes.   The history is provided by the patient and medical records. No language interpreter was used.  Anxiety  Pertinent negatives include no chest pain, no abdominal pain, no headaches and no shortness of breath.  Hyperglycemia  Associated symptoms: no abdominal pain, no chest pain, no dysuria, no fever, no nausea, no shortness of breath and no vomiting     Past Medical History:  Diagnosis Date  . Asthma   . Bipolar 1 disorder (HCC)   . Chest pain   . Chronic headaches   . CIDP (chronic inflammatory demyelinating polyneuropathy) (HCC)   . Depression   . Diabetes mellitus   . GERD (gastroesophageal reflux disease)   . HTN (hypertension)   . Hyperlipidemia   . Ingrown nail 11/12/2013  . Kidney stones   . Lumbar disc disease   . OSA (obstructive sleep apnea)   . Osteoarthritis   . Peripheral neuropathy  (HCC) 02/01/2011  . Polysubstance abuse    cocaine, marijuana-quit summer 2010  . Tobacco abuse     Patient Active Problem List   Diagnosis Date Noted  . Cocaine dependence with cocaine-induced mood disorder (HCC) 01/09/2016  . Protein-calorie malnutrition, severe 12/02/2015  . Diabetes mellitus with nonketotic hyperosmolarity (HCC) 11/30/2015  . Major depressive disorder, recurrent severe without psychotic features (HCC)   . Onychomycosis 01/04/2014  . Ingrown nail 11/12/2013  . Onychia and paronychia of toe 11/12/2013  . Deformity of metatarsal bone of right foot 08/17/2013  . Low TSH level 07/29/2011  . Peripheral neuropathy (HCC) 02/01/2011  . GERD 07/10/2010  . OBSTRUCTIVE SLEEP APNEA 09/26/2009  . IBS 06/10/2009  . SCIATICA, RIGHT 06/02/2009  . SUBSTANCE ABUSE, MULTIPLE 01/10/2009  . LUNG NODULE 12/04/2007  . HYPOGONADISM, MALE 10/09/2007  . ERECTILE DYSFUNCTION 09/14/2007  . DIABETES MELLITUS, TYPE II 04/12/2007  . HYPERLIPIDEMIA 04/12/2007  . Depression, major, recurrent (HCC) 04/12/2007  . Essential hypertension 04/12/2007  . OSTEOARTHRITIS 04/12/2007    Past Surgical History:  Procedure Laterality Date  . BUNIONECTOMY  06-2009  . KNEE SURGERY         Home Medications    Prior to Admission medications   Medication Sig Start Date End Date Taking? Authorizing Provider  ARIPiprazole ER (ABILIFY MAINTENA) 400 MG PRSY Inject 400 mg into the muscle every 30 (thirty) days.   Yes Historical Provider, MD    Family History Family History  Problem Relation Age of Onset  .  Heart disease Mother   . Asthma Mother   . Stroke Mother   . COPD Mother     maternal un  . Diabetes Father   . Hyperlipidemia Father   . Hypertension Father   . Depression Father   . Emphysema Maternal Uncle   . Asthma Sister   . Alzheimer's disease Maternal Grandmother   . Breast cancer Maternal Aunt     Social History Social History  Substance Use Topics  . Smoking status: Current  Some Day Smoker    Packs/day: 0.20    Years: 17.00    Types: Cigarettes    Last attempt to quit: 02/24/2011  . Smokeless tobacco: Former Neurosurgeon  . Alcohol use No     Comment: socially     Allergies   Nystatin; Statins; Iodine; and Shellfish-derived products   Review of Systems Review of Systems  Constitutional: Negative for chills and fever.  HENT: Negative for congestion and sore throat.   Eyes: Negative for visual disturbance.  Respiratory: Negative for cough and shortness of breath.   Cardiovascular: Negative for chest pain.  Gastrointestinal: Negative for abdominal pain, diarrhea, nausea and vomiting.  Genitourinary: Negative for dysuria.  Musculoskeletal: Negative for back pain and neck pain.  Skin: Negative for rash.  Neurological: Negative for headaches.  Psychiatric/Behavioral: Positive for dysphoric mood and hallucinations. Negative for suicidal ideas. The patient is nervous/anxious.      Physical Exam Updated Vital Signs BP 109/77 (BP Location: Right Arm)   Pulse 104   Temp 98.3 F (36.8 C) (Oral)   Resp 18   Ht 6\' 1"  (1.854 m)   Wt 68 kg   SpO2 99%   BMI 19.79 kg/m   Physical Exam  Constitutional: He is oriented to person, place, and time. He appears well-developed and well-nourished. No distress.  Nontoxic appearing.  HENT:  Head: Normocephalic and atraumatic.  Right Ear: External ear normal.  Left Ear: External ear normal.  Mouth/Throat: Oropharynx is clear and moist.  Eyes: Conjunctivae are normal. Pupils are equal, round, and reactive to light. Right eye exhibits no discharge. Left eye exhibits no discharge.  Neck: Neck supple.  Cardiovascular: Normal rate, regular rhythm, normal heart sounds and intact distal pulses.   Pulmonary/Chest: Effort normal and breath sounds normal. No respiratory distress. He has no wheezes. He has no rales.  Abdominal: Soft. There is no tenderness.  Musculoskeletal: Normal range of motion. He exhibits no edema or  tenderness.  Lymphadenopathy:    He has no cervical adenopathy.  Neurological: He is alert and oriented to person, place, and time. Coordination normal.  Skin: Skin is warm and dry. Capillary refill takes less than 2 seconds. No rash noted. He is not diaphoretic. No erythema. No pallor.  Psychiatric: He has a normal mood and affect. His behavior is normal. His mood appears not anxious. His speech is not rapid and/or pressured, not delayed and not slurred. He is not agitated, not aggressive and not actively hallucinating. Thought content is not paranoid. He does not exhibit a depressed mood. He expresses homicidal ideation. He expresses no suicidal ideation. He expresses no homicidal plans.  Patient does not appear anxious or depressed. He makes good eye contact. His speech is clear and coherent. He endorses homicidal ideations without a plan. He denies suicidal ideations. He does not appear to be responding to internal stimuli. He endorses auditory hallucinations.  Nursing note and vitals reviewed.    ED Treatments / Results  Labs (all labs ordered are listed,  but only abnormal results are displayed) Labs Reviewed  COMPREHENSIVE METABOLIC PANEL - Abnormal; Notable for the following:       Result Value   Glucose, Bld 427 (*)    All other components within normal limits  CBC WITH DIFFERENTIAL/PLATELET - Abnormal; Notable for the following:    WBC 3.5 (*)    RBC 3.90 (*)    Hemoglobin 11.7 (*)    HCT 34.7 (*)    Platelets 122 (*)    All other components within normal limits  CBG MONITORING, ED - Abnormal; Notable for the following:    Glucose-Capillary 428 (*)    All other components within normal limits  URINALYSIS, ROUTINE W REFLEX MICROSCOPIC  RAPID URINE DRUG SCREEN, HOSP PERFORMED  ETHANOL  ACETAMINOPHEN LEVEL  SALICYLATE LEVEL  CBG MONITORING, ED    EKG  EKG Interpretation None       Radiology No results found.  Procedures Procedures (including critical care  time)  Medications Ordered in ED Medications  sodium chloride 0.9 % bolus 1,000 mL (not administered)  sodium chloride 0.9 % bolus 1,000 mL (1,000 mLs Intravenous New Bag/Given 09/15/16 1359)     Initial Impression / Assessment and Plan / ED Course  I have reviewed the triage vital signs and the nursing notes.  Pertinent labs & imaging results that were available during my care of the patient were reviewed by me and considered in my medical decision making (see chart for details).    Patient presented requesting rehabilitation for cocaine abuse. He tells me he is homicidal towards feebly selling cocaine and later tells me is homicidal towards his sister. He denies any specific plan. He denies suicidal ideations. He does not appear to be responding to internal stimuli. He makes good eye contact. CMP reveals glucose of 427 with normal anion gap. Patient receiving fluid bolus.  At shift change other blood work is pending. Will have patient be seen by behavioral health. Patient care [facility] PA-C at shift change who will follow up on patient.   Final Clinical Impressions(s) / ED Diagnoses   Final diagnoses:  Homicidal ideations  Hyperglycemia    New Prescriptions New Prescriptions   No medications on file     Everlene FarrierWilliam Adriene Padula, PA-C 09/15/16 1447    Raeford RazorStephen Kohut, MD 10/01/16 [phone]

## 2016-09-15 NOTE — ED Notes (Signed)
Wayne [name]/TTS called and informed that the patient has been cleared by psychiatry and his ACT team representative will be picking him up.

## 2016-09-15 NOTE — ED Triage Notes (Signed)
Patient is seeking help for depression & rehab; however, his CBG is 428. Patient denies alcohol.  Patient states he used cocaine this morning.

## 2016-09-15 NOTE — ED Notes (Signed)
Patient states he is homicidal. Patient states he wants to kill his sister because she kicked him out of her house. Patient denies any plan. Patient mentined several times that he wanted to kill his sister.

## 2016-09-15 NOTE — ED Notes (Signed)
Patient is awaiting for psychiatrist evaluation in the AM. In the meantime, pt is unable to go to [facility] due to he wears a C-PAP at night. Pt states he wears his C-pap 3 out of the 7 nights. Pt agrees he will wear it tonight. Obtain order from provider for C-pap and informed respiratory of C-pap order.

## 2016-09-15 NOTE — ED Notes (Signed)
Pt reports he is suicidal but does not have a plan. Pt lying on stretcher, relaxing with eyes closed when entering the room.

## 2016-09-15 NOTE — ED Provider Notes (Signed)
PROGRESS NOTE                                                                                                                 This is a sign-out from PA Dansie  at shift change: Tino A Caryn SectionFox is a 41 y.o. male presenting with homicidal ideation and plan to hurt his sister, he also has polysubstance abuse and uncontrolled diabetes. Glucose is elevated at 428 however he has a normal anion gap of 8. Only 20 ketones in the urine, this is not DKA. Patient is given fluids and started on sliding scale insulin. Blood work otherwise reassuring, mild tachycardia likely secondary to hyperglycemia. Patient without chest pain or palpitations.. Please refer to previous note for full HPI, ROS, PMH and PE.   Patient is medically cleared for psychiatric evaluation will be transferred to the psych ED. TTS consulted, home meds and psych standard holding orders placed.        Wynetta Emeryicole Tyronda Vizcarrondo, PA-C 09/15/16 1619  Per Jessie Footoyka: sister kicked out today 2/2used drugs  patient reportedly had ongoing difficulties with his sister which she has regularly because of his drug use she kicked him out of the house today. He said that he doesn't want to kill her but he is very upset with her and does want to hurt her. Psych NP May has recommended that this patient be kept in the ED for psychiatric evaluation in the morning however, she is fine with patient being evaluated by his act team and if they can find a safe place for him to stay tonight okay for discharge. Act team keep will come to evaluate the patient tonight, I've asked Toyka to update me on plan when act team evaluates the patient.      Wynetta Emeryicole Majel Giel, PA-C 09/16/16 0036    Raeford RazorStephen Kohut, MD 10/01/16 [phone]

## 2016-09-15 NOTE — ED Notes (Signed)
Belongings:  Blue sweater, big blue bag, jeans, belt, blk shoes.

## 2016-09-15 NOTE — BH Assessment (Addendum)
[facility] Assessment Progress Note  At the request of May Agustin, NP and Melynda Rippleoyka [place], [place], this writer called [facility] [phone]), pt's outpatient provider, to ask them to come to the ED to help him with problem solving with a view toward discharging pt from the ED.  Call was placed at 15:10 and rolled to voice mail; I left a message.  At 15:38 I called again and spoke to [facility].  She reports that she will research pt's case, and invites me to call her back in 15 minutes.  Doylene Canninghomas Jenina Moening, MA Triage Specialist [phone]   Addendum:  At 16:07 this writer [facility].  She reports that pt's nurse, Ardine BjorkKeba, will come to the ED soon to pick the pt up.  Lawanna Kobusngel adds that [place] gave pt his scheduled injection today.  Ardine BjorkKeba will either try to find a shelter for pt tonight or with return him to the family household.  Doylene Canninghomas Scorpio Fortin, MA Triage Specialist [phone]

## 2016-09-15 NOTE — ED Notes (Signed)
Will hold insulin order til 21:30 and recheck.

## 2016-09-15 NOTE — BH Assessment (Addendum)
Assessment Note  Wayne [name] is an 41 y.o. male with history of Bipolar I Disorder. [facility] thoughts of wanting to harm his sister. Sts that he lives with his mother. [name] is often visiting the mother. Sts that today patient's sister kicked patient out of the home. Writer asked patient the reason he was kicked out of the home. Patient stated, "It's because my family is tired of my mess". Patient reports using drugs in his mothers home. He used drugs/ cocaine in his mothers home at 5am this morning. He typically uses cocaine when he feels stressed. He also reports occasional THC use.   He has made statements about wanting to hurt his sister. Patient sts, "I want to hurt her but not kill her". He reports on-going conflict with his sister. He denies a plan to hurt his sister. No history of harm to others including his sister. He denies legal issues. No AVH's. No history of INPT treatment. He seeks services with an ACTT provider ([facility]). Patient [facility]     Diagnosis: Bipolar I Disorder  Past Medical History:  Past Medical History:  Diagnosis Date  . Asthma   . Bipolar 1 disorder (HCC)   . Chest pain   . Chronic headaches   . CIDP (chronic inflammatory demyelinating polyneuropathy) (HCC)   . Depression   . Diabetes mellitus   . GERD (gastroesophageal reflux disease)   . HTN (hypertension)   . Hyperlipidemia   . Ingrown nail 11/12/2013  . Kidney stones   . Lumbar disc disease   . OSA (obstructive sleep apnea)   . Osteoarthritis   . Peripheral neuropathy (HCC) 02/01/2011  . Polysubstance abuse    cocaine, marijuana-quit summer 2010  . Tobacco abuse     Past Surgical History:  Procedure Laterality Date  . BUNIONECTOMY  06-2009  . KNEE SURGERY      Family History:  Family History  Problem Relation Age of Onset  . Heart disease Mother   . Asthma Mother   . Stroke Mother   . COPD Mother      maternal un  . Diabetes Father   . Hyperlipidemia Father   . Hypertension Father   . Depression Father   . Emphysema Maternal Uncle   . Asthma Sister   . Alzheimer's disease Maternal Grandmother   . Breast cancer Maternal Aunt     Social History:  reports that he has been smoking Cigarettes.  He has a 3.40 pack-year smoking history. He has quit using smokeless tobacco. He reports that he uses drugs, including Cocaine. He reports that he does not drink alcohol.  Additional Social History:  Alcohol / Drug Use Pain Medications: see chart Prescriptions: see chart Over the Counter: see chart History of alcohol / drug use?: Yes Longest period of sobriety (when/how long): couple months Negative Consequences of Use: Financial, Personal relationships Withdrawal Symptoms: Other (Comment) Substance #1 Name of Substance 1: cocaine  1 - Age of First Use: 41 yrs old  1 - Amount (size/oz): "I don't even know" 1 - Frequency: 1x/ 2 months  1 - Duration: on-going  1 - Last Use / Amount: 09/15/2016; 5am; amt unknon  Substance #2 Name of Substance 2: thc  2 - Age of First Use: 41 yrs old  2 - Amount (size/oz): "I'm not sure" 2 - Frequency: 1x/ 8months  2 - Duration: on-going  2 - Last Use /  Amount: 2 weeks ago  CIWA: CIWA-Ar BP: (!) 155/117 Pulse Rate: 103 COWS:    Allergies:  Allergies  Allergen Reactions  . Nystatin Other (See Comments)    Reaction:  Muscle weakness   . Statins Other (See Comments)    Reaction:  Muscle weakness and stomach cramps   . Iodine Rash  . Shellfish-Derived Products Rash    Home Medications:  (Not in a hospital admission)  OB/GYN Status:  No LMP for male patient.  General Assessment Data Location of Assessment: [facility] ED TTS Assessment: In system Is this a Tele or Face-to-Face Assessment?: Face-to-Face Is this an Initial Assessment or a Re-assessment for this encounter?: Initial Assessment Marital status: Single Maiden name:  (n/a) Is patient  pregnant?: No Pregnancy Status: No Living Arrangements: Other (Comment), Other relatives, Parent (with mother ) Can pt return to current living arrangement?: No Admission Status: Voluntary Is patient capable of signing voluntary admission?: Yes Referral Source: Self/Family/Friend Insurance type:  Herbalist)     Crisis Care Plan Living Arrangements: Other (Comment), Other relatives, Parent (with mother ) Legal Guardian: Other: (no legal guardian ) Name of Psychiatrist:  (no psychiatrist ) Name of Therapist:  (no therapist )  Education Status Is patient currently in school?: No Current Grade:  (n/a) Highest grade of school patient has completed:  (associates degree) Name of school:  (n/a) Contact person:  (n/a)  Risk to self with the past 6 months Suicidal Ideation: No Has patient been a risk to self within the past 6 months prior to admission? : No Suicidal Intent: No Has patient had any suicidal intent within the past 6 months prior to admission? : No Is patient at risk for suicide?: No Suicidal Plan?: No Has patient had any suicidal plan within the past 6 months prior to admission? : No Access to Means: No What has been your use of drugs/alcohol within the last 12 months?:  (n/a) Previous Attempts/Gestures: Yes How many times?:  (2x's- "I didn't take my insulin") Other Self Harm Risks: n/a Triggers for Past Attempts: Other (Comment) ("my sister") Intentional Self Injurious Behavior: None Family Suicide History: No Recent stressful life event(s): Other (Comment) ("My sister kicked me out of my mothers home") Persecutory voices/beliefs?: No Depression: Yes Depression Symptoms: Feeling angry/irritable, Loss of interest in usual pleasures Substance abuse history and/or treatment for substance abuse?: No Suicide prevention information given to non-admitted patients: Not applicable  Risk to Others within the past 6 months Homicidal Ideation: Yes-Currently Present Does patient  have any lifetime risk of violence toward others beyond the six months prior to admission? : Yes (comment) Thoughts of Harm to Others: Yes-Currently Present Comment - Thoughts of Harm to Others:  ("I will hurt my sister because she kicked me out") Current Homicidal Intent: No Current Homicidal Plan: No Access to Homicidal Means: No Identified Victim:  (n/a) History of harm to others?: No Assessment of Violence: None Noted Violent Behavior Description:  (patient is calm and cooperative) Does patient have access to weapons?: No Criminal Charges Pending?: No Does patient have a court date: No Is patient on probation?: No  Psychosis Hallucinations: None noted Delusions: None noted  Mental Status Report Appearance/Hygiene: Disheveled Eye Contact: Good Motor Activity: Freedom of movement Speech: Logical/coherent Level of Consciousness: Alert Mood: Depressed Affect: Appropriate to circumstance Anxiety Level: None Thought Processes: Relevant, Coherent Judgement: Impaired Orientation: Person, Place, Situation, Time Obsessive Compulsive Thoughts/Behaviors: None  Cognitive Functioning Concentration: Decreased Memory: Remote Intact, Recent Intact IQ: Average Insight: Poor Impulse Control:  Fair Appetite: Fair Weight Loss:  (none reported) Weight Gain:  (none reported) Sleep: Decreased Total Hours of Sleep:  (4 hrs per night ) Vegetative Symptoms: None  ADLScreening [facility] Assessment Services) Patient's cognitive ability adequate to safely complete daily activities?: Yes Patient able to express need for assistance with ADLs?: Yes Independently performs ADLs?: Yes (appropriate for developmental age)  Prior Inpatient Therapy Prior Inpatient Therapy: No Prior Therapy Dates:  (n/a) Prior Therapy Facilty/Provider(s):  (n/a) Reason for Treatment:  (n/a)  Prior Outpatient Therapy Prior Outpatient Therapy: No Prior Therapy Dates:  (n/a) Prior Therapy Facilty/Provider(s):   (n/a) Reason for Treatment:  (n/a) Does patient have an ACCT team?: No Does patient have Intensive In-House Services?  : No Does patient have [facility] services? : No Does patient have P4CC services?: No  ADL Screening (condition at time of admission) Patient's cognitive ability adequate to safely complete daily activities?: Yes Is the patient deaf or have difficulty hearing?: No Does the patient have difficulty seeing, even when wearing glasses/contacts?: No Does the patient have difficulty concentrating, remembering, or making decisions?: No Patient able to express need for assistance with ADLs?: Yes Does the patient have difficulty dressing or bathing?: No Independently performs ADLs?: Yes (appropriate for developmental age) Does the patient have difficulty walking or climbing stairs?: No Weakness of Legs: None Weakness of Arms/Hands: None  Home Assistive Devices/Equipment Home Assistive Devices/Equipment: None    Abuse/Neglect Assessment (Assessment to be complete while patient is alone) Physical Abuse: Denies Verbal Abuse: Denies Sexual Abuse: Denies Exploitation of patient/patient's resources: Denies Self-Neglect: Denies Values / Beliefs Cultural Requests During Hospitalization: None Spiritual Requests During Hospitalization: None   Advance Directives (For Healthcare) Does Patient Have a Medical Advance Directive?: No Would patient like information on creating a medical advance directive?: No - Patient declined Nutrition Screen- [facility] Patient's home diet: Regular  Additional Information 1:1 In Past 12 Months?: No CIRT Risk: No Elopement Risk: No Does patient have medical clearance?: Yes     Disposition: Per May, NP, patient to remain in the ED overnight. Pending am psych evaluation. However, if patients ACTT provider ([facility]) is willing to come to [facility], evaluate patient, and develop a safety plan patient may discharge today.   Disposition Initial  Assessment Completed for this Encounter: Yes Disposition of Patient: Inpatient treatment program (Pending overnight observation; Pending am psych evaluation )  On Site Evaluation by:   Reviewed with Physician:    Melynda Ripple 09/15/2016 3:32 PM

## 2016-09-16 LAB — CBG MONITORING, ED
GLUCOSE-CAPILLARY: 152 mg/dL — AB (ref 65–99)
GLUCOSE-CAPILLARY: 154 mg/dL — AB (ref 65–99)
Glucose-Capillary: 176 mg/dL — ABNORMAL HIGH (ref 65–99)

## 2016-09-16 NOTE — BH Assessment (Addendum)
[facility] Assessment Progress Note  At 09:07 this Clinical research associatewriter called [facility] [phone]) and spoke to [place].  I reported to her that [facility].  She is uncertain why this occurred, and agrees to investigate the matter and call me back.  Johnny BridgeMartha, [facility], has been notified.  Doylene Canninghomas Noe Pittsley, MA Triage Specialist [phone]   Addendum:  At 11:15 Kayla calls from [facility] to report that she will be here in 20 minutes to pick pt up.  Pt's nurse has been notified.  Doylene Canninghomas Yoneko Talerico, MA Triage Specialist [phone]

## 2016-09-16 NOTE — ED Notes (Signed)
Provided a warm blanket.

## 2017-01-24 IMAGING — CR DG CHEST 2V
2 series · 2 of 2 positions shown · non-contrast
Comparison: Prior chest x-ray 01/24/2015

CLINICAL DATA: 39-year-old male with shortness of breath,
productive cough and generalized weakness

EXAM:
CHEST  2 VIEW

[w chest pa]
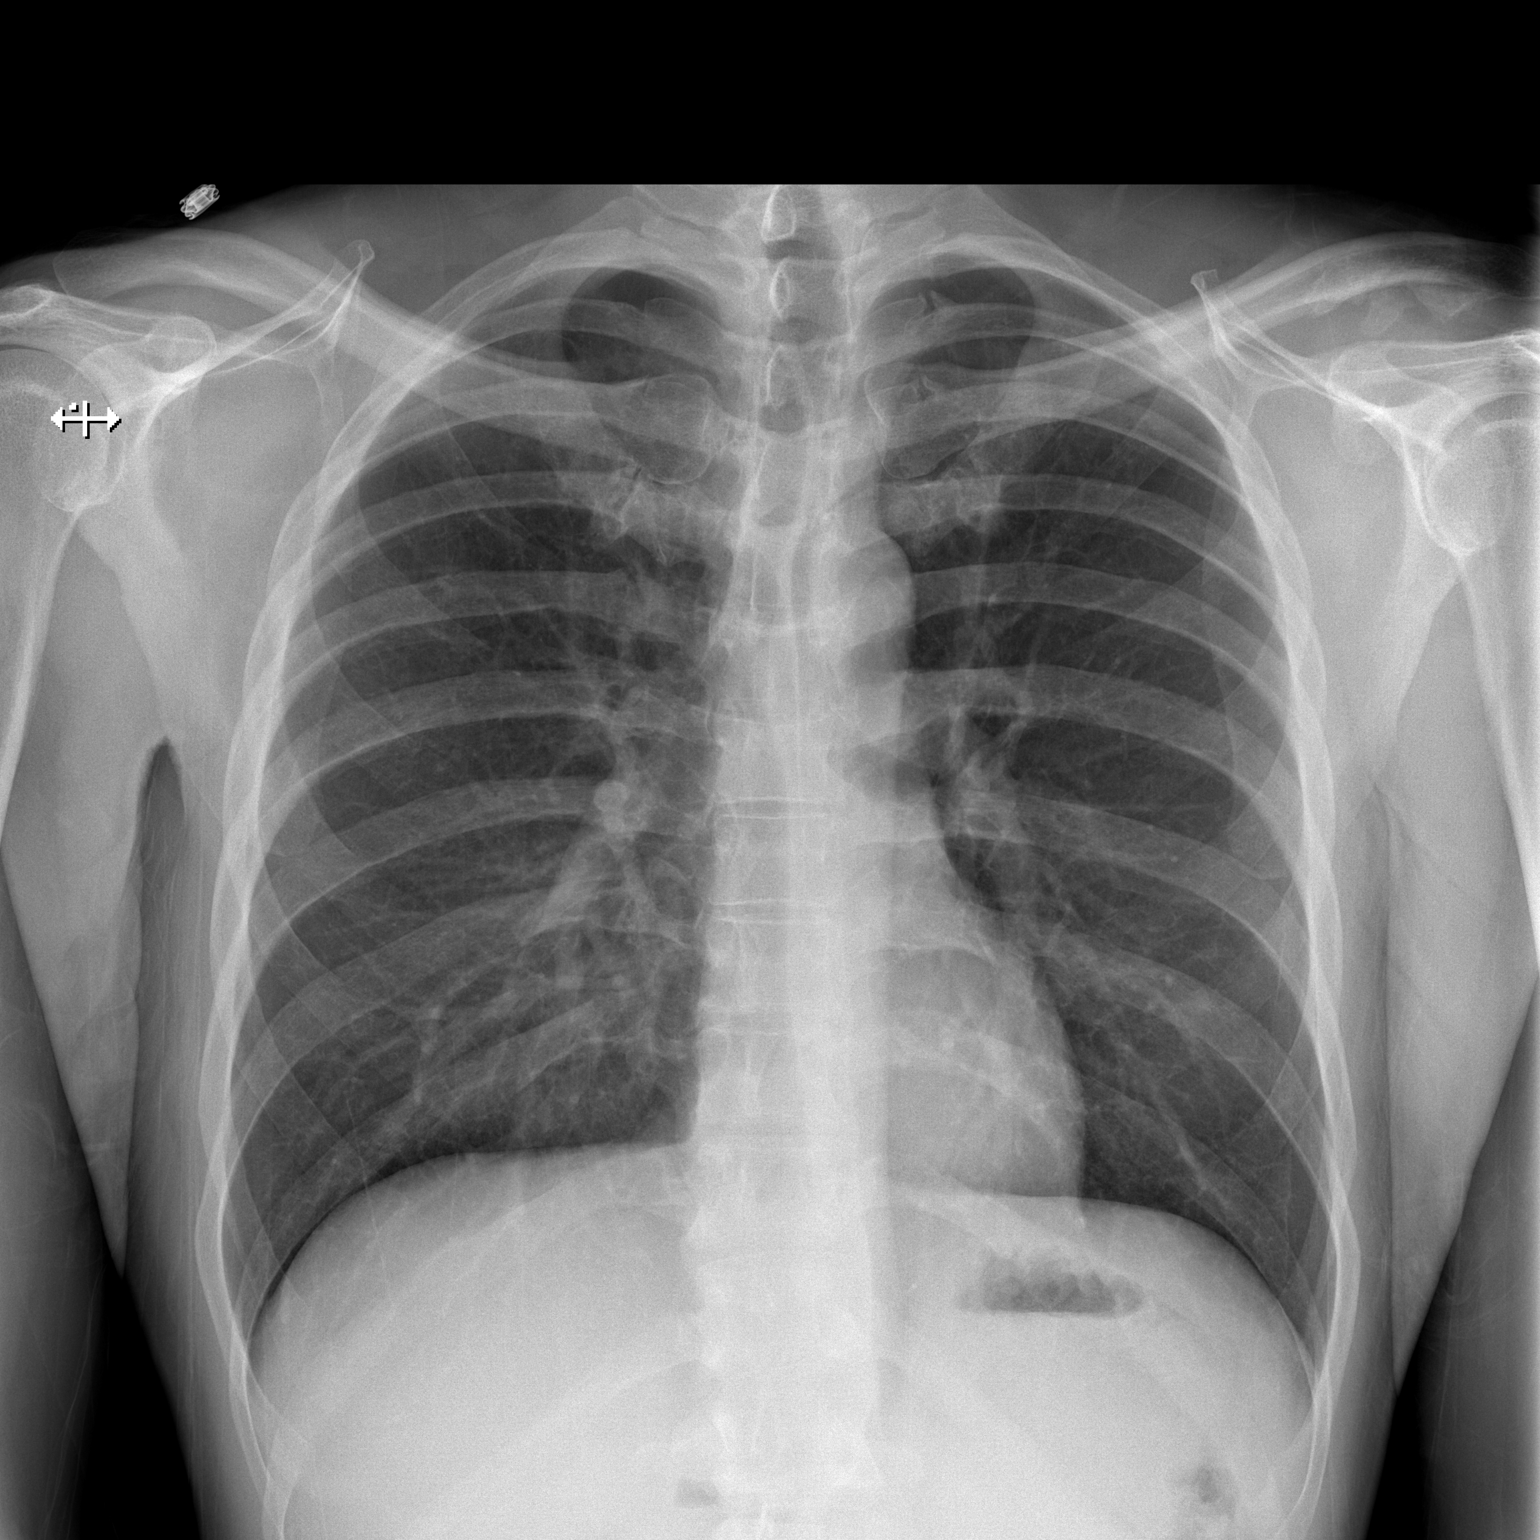

[w chest lat]
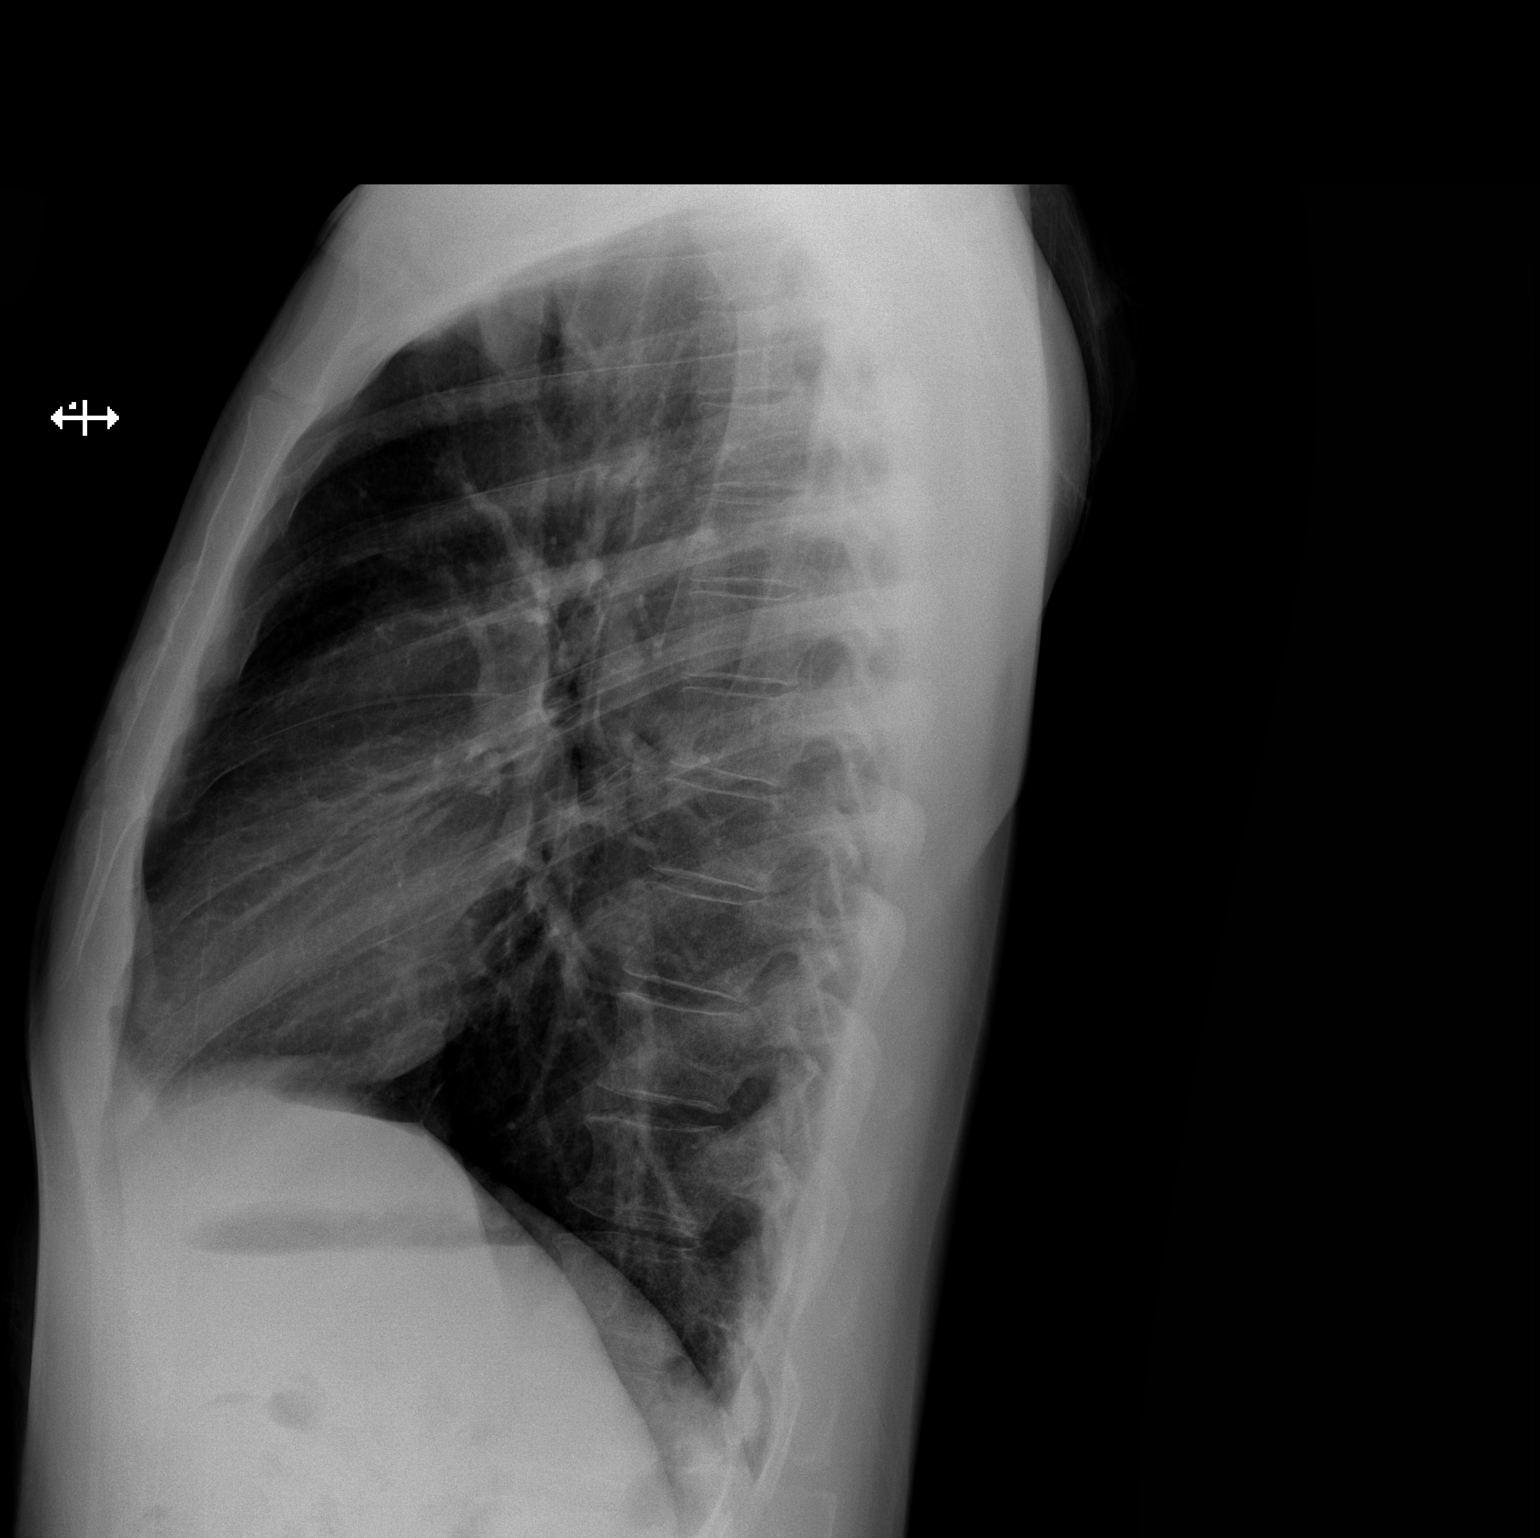

[2 of 2 positions shown; findings below may reference images not displayed]

FINDINGS: The lungs are clear and negative for focal airspace consolidation,
pulmonary edema or suspicious pulmonary nodule. No pleural effusion
or pneumothorax. Cardiac and mediastinal contours are within normal
limits. No acute fracture or lytic or blastic osseous lesions. The
visualized upper abdominal bowel gas pattern is unremarkable.
IMPRESSION: Negative chest x-ray.

## 2017-03-14 DIAGNOSIS — S92515A Nondisplaced fracture of proximal phalanx of left lesser toe(s), initial encounter for closed fracture: Secondary | ICD-10-CM | POA: Insufficient documentation

## 2017-09-19 ENCOUNTER — Emergency Department (HOSPITAL_BASED_OUTPATIENT_CLINIC_OR_DEPARTMENT_OTHER): Payer: BLUE CROSS/BLUE SHIELD

## 2017-09-19 ENCOUNTER — Encounter (HOSPITAL_BASED_OUTPATIENT_CLINIC_OR_DEPARTMENT_OTHER): Payer: Self-pay

## 2017-09-19 ENCOUNTER — Emergency Department (HOSPITAL_BASED_OUTPATIENT_CLINIC_OR_DEPARTMENT_OTHER)
Admission: EM | Admit: 2017-09-19 | Discharge: 2017-09-20 | Disposition: A | Discharge status: Home / Self Care | Payer: BLUE CROSS/BLUE SHIELD | Attending: Emergency Medicine | Admitting: Emergency Medicine

## 2017-09-19 ENCOUNTER — Other Ambulatory Visit: Payer: Self-pay

## 2017-09-19 DIAGNOSIS — F17210 Nicotine dependence, cigarettes, uncomplicated: Secondary | ICD-10-CM | POA: Insufficient documentation

## 2017-09-19 DIAGNOSIS — J449 Chronic obstructive pulmonary disease, unspecified: Secondary | ICD-10-CM | POA: Diagnosis not present

## 2017-09-19 DIAGNOSIS — Z79899 Other long term (current) drug therapy: Secondary | ICD-10-CM | POA: Diagnosis not present

## 2017-09-19 DIAGNOSIS — E119 Type 2 diabetes mellitus without complications: Secondary | ICD-10-CM | POA: Diagnosis not present

## 2017-09-19 DIAGNOSIS — M545 Low back pain: Secondary | ICD-10-CM | POA: Insufficient documentation

## 2017-09-19 DIAGNOSIS — R32 Unspecified urinary incontinence: Secondary | ICD-10-CM | POA: Diagnosis present

## 2017-09-19 DIAGNOSIS — Z794 Long term (current) use of insulin: Secondary | ICD-10-CM | POA: Insufficient documentation

## 2017-09-19 DIAGNOSIS — M79675 Pain in left toe(s): Secondary | ICD-10-CM | POA: Insufficient documentation

## 2017-09-19 DIAGNOSIS — N410 Acute prostatitis: Secondary | ICD-10-CM | POA: Diagnosis not present

## 2017-09-19 DIAGNOSIS — Z7982 Long term (current) use of aspirin: Secondary | ICD-10-CM | POA: Insufficient documentation

## 2017-09-19 HISTORY — DX: Testicular hypofunction: E29.1

## 2017-09-19 HISTORY — DX: Irritable bowel syndrome, unspecified: K58.9

## 2017-09-19 HISTORY — DX: Chronic obstructive pulmonary disease, unspecified: J44.9

## 2017-09-19 HISTORY — DX: Anemia, unspecified: D64.9

## 2017-09-19 LAB — URINALYSIS, ROUTINE W REFLEX MICROSCOPIC
BILIRUBIN URINE: NEGATIVE
HGB URINE DIPSTICK: NEGATIVE
KETONES UR: NEGATIVE mg/dL
Leukocytes, UA: NEGATIVE
NITRITE: NEGATIVE
Protein, ur: NEGATIVE mg/dL
Specific Gravity, Urine: 1.02 (ref 1.005–1.030)
pH: 6.5 (ref 5.0–8.0)

## 2017-09-19 LAB — BASIC METABOLIC PANEL
Anion gap: 9 (ref 5–15)
BUN: 18 mg/dL (ref 6–20)
CALCIUM: 9.3 mg/dL (ref 8.9–10.3)
CO2: 26 mmol/L (ref 22–32)
Chloride: 105 mmol/L (ref 101–111)
Creatinine, Ser: 1.04 mg/dL (ref 0.61–1.24)
Glucose, Bld: 228 mg/dL — ABNORMAL HIGH (ref 65–99)
Potassium: 4.2 mmol/L (ref 3.5–5.1)
Sodium: 140 mmol/L (ref 135–145)

## 2017-09-19 LAB — CBC WITH DIFFERENTIAL/PLATELET
BASOS PCT: 1 %
Basophils Absolute: 0 10*3/uL (ref 0.0–0.1)
EOS PCT: 2 %
Eosinophils Absolute: 0.1 10*3/uL (ref 0.0–0.7)
HCT: 29.5 % — ABNORMAL LOW (ref 39.0–52.0)
Hemoglobin: 10.1 g/dL — ABNORMAL LOW (ref 13.0–17.0)
Lymphocytes Relative: 40 %
Lymphs Abs: 1.4 10*3/uL (ref 0.7–4.0)
MCH: 31.6 pg (ref 26.0–34.0)
MCHC: 34.2 g/dL (ref 30.0–36.0)
MCV: 92.2 fL (ref 78.0–100.0)
MONO ABS: 0.5 10*3/uL (ref 0.1–1.0)
Monocytes Relative: 15 %
NEUTROS ABS: 1.6 10*3/uL — AB (ref 1.7–7.7)
Neutrophils Relative %: 42 %
PLATELETS: 142 10*3/uL — AB (ref 150–400)
RBC: 3.2 MIL/uL — AB (ref 4.22–5.81)
RDW: 12.3 % (ref 11.5–15.5)
WBC: 3.7 10*3/uL — AB (ref 4.0–10.5)

## 2017-09-19 LAB — URINALYSIS, MICROSCOPIC (REFLEX)
RBC / HPF: NONE SEEN RBC/hpf (ref 0–5)
WBC UA: NONE SEEN WBC/hpf (ref 0–5)

## 2017-09-19 LAB — CBG MONITORING, ED: GLUCOSE-CAPILLARY: 211 mg/dL — AB (ref 65–99)

## 2017-09-19 NOTE — ED Provider Notes (Signed)
[facility] EMERGENCY DEPARTMENT Provider Note   CSN: [id number] Arrival date & time: 09/19/17  1622     History   Chief Complaint Chief Complaint  Patient presents with  . Flank Pain    HPI Wayne [name] is a 42 y.o. male with past medical history of insulin-dependent type 2 diabetes, COPD, bipolar 1 disorder, hypertension, chronic inflammatory demyelinating polyneuropathy, presenting to the ED with multiple days of urinary incontinence.  Patient states he is having troubles urinating, stating it is a "dribble." He reports associated dysuria, lower back pain worse on the left, lower abdominal pain, and pain with bowel movements.  He states he has also had new onset of bowel incontinence since about Thursday.  He states he has been having troubles holding his stool.  He states he has chronic weakness in his lower extremities, however it is slightly worse than normal (chronic weakness 2/t chronic inflammatory demyelinating polyneuropathy). States he fell onto his knees last week, though denies any known back injury from the fall. Does note left 5th toe pain that is worse with ambulation and palpation. Denies N/V, saddle paresthesias, testicular pain, penile discharge, F/C, or other complaints. States he is not currently sexually active.   Patient's neurologist is Dr. Orie Rout with [facility].  The history is provided by the patient.    Past Medical History:  Diagnosis Date  . Anemia   . Asthma   . Bipolar 1 disorder (HCC)   . Chest pain   . Chronic headaches   . CIDP (chronic inflammatory demyelinating polyneuropathy) (HCC)   . COPD (chronic obstructive pulmonary disease) (HCC)   . Depression   . Diabetes mellitus   . GERD (gastroesophageal reflux disease)   . HTN (hypertension)   . Hyperlipidemia   . Hypogonadism in male   . IBS (irritable bowel syndrome)   . Ingrown nail 11/12/2013  . Kidney stones   . Lumbar disc disease   . OSA (obstructive sleep apnea)   .  Osteoarthritis   . Peripheral neuropathy 02/01/2011  . Polysubstance abuse (HCC)    cocaine, marijuana-quit summer 2010  . Tobacco abuse     Patient Active Problem List   Diagnosis Date Noted  . Cocaine dependence with cocaine-induced mood disorder (HCC) 01/09/2016  . Protein-calorie malnutrition, severe 12/02/2015  . Diabetes mellitus with nonketotic hyperosmolarity (HCC) 11/30/2015  . Major depressive disorder, recurrent severe without psychotic features (HCC)   . Onychomycosis 01/04/2014  . Ingrown nail 11/12/2013  . Onychia and paronychia of toe 11/12/2013  . Deformity of metatarsal bone of right foot 08/17/2013  . Low TSH level 07/29/2011  . Peripheral neuropathy 02/01/2011  . GERD 07/10/2010  . OBSTRUCTIVE SLEEP APNEA 09/26/2009  . IBS 06/10/2009  . SCIATICA, RIGHT 06/02/2009  . SUBSTANCE ABUSE, MULTIPLE 01/10/2009  . LUNG NODULE 12/04/2007  . HYPOGONADISM, MALE 10/09/2007  . ERECTILE DYSFUNCTION 09/14/2007  . DIABETES MELLITUS, TYPE II 04/12/2007  . HYPERLIPIDEMIA 04/12/2007  . Depression, major, recurrent (HCC) 04/12/2007  . Essential hypertension 04/12/2007  . OSTEOARTHRITIS 04/12/2007    Past Surgical History:  Procedure Laterality Date  . BUNIONECTOMY  06-2009  . KNEE SURGERY         Home Medications    Prior to Admission medications   Medication Sig Start Date End Date Taking? Authorizing Provider  albuterol (PROVENTIL HFA;VENTOLIN HFA) 108 (90 Base) MCG/ACT inhaler Inhale into the lungs every 6 (six) hours as needed for wheezing or shortness of breath.   Yes [provider]  aspirin EC 81 MG tablet Take 81 mg by mouth daily.   Yes [provider]  azaTHIOprine (IMURAN) 50 MG tablet Take 50 mg by mouth daily.   Yes [provider]  baclofen (LIORESAL) 10 MG tablet Take 10 mg by mouth 3 (three) times daily.   Yes [provider]  budesonide-formoterol (SYMBICORT) 80-4.5 MCG/ACT inhaler Inhale 2 puffs into the lungs 2  (two) times daily.   Yes [provider]  buPROPion (WELLBUTRIN XL) 300 MG 24 hr tablet Take 300 mg by mouth daily.   Yes [provider]  cholecalciferol (VITAMIN D) 400 units TABS tablet Take 400 Units by mouth.   Yes [provider]  clotrimazole (LOTRIMIN) 1 % cream Apply 1 application topically 2 (two) times daily.   Yes [provider]  cyanocobalamin 1000 MCG tablet Take 1,000 mcg by mouth daily.   Yes [provider]  diazepam (VALIUM) 5 MG tablet Take 5 mg by mouth every 6 (six) hours as needed for anxiety.   Yes [provider]  divalproex (DEPAKOTE ER) 250 MG 24 hr tablet Take 250 mg by mouth daily.   Yes [provider]  ezetimibe (ZETIA) 10 MG tablet Take 10 mg by mouth daily.   Yes [provider]  fluticasone (FLONASE) 50 MCG/ACT nasal spray Place into both nostrils daily.   Yes [provider]  gabapentin (NEURONTIN) 300 MG capsule Take 300 mg by mouth 3 (three) times daily.   Yes [provider]  glucosamine-chondroitin 500-400 MG tablet Take 1 tablet by mouth 3 (three) times daily.   Yes [provider]  hydrOXYzine (VISTARIL) 25 MG capsule Take 25 mg by mouth 3 (three) times daily as needed.   Yes [provider]  insulin aspart (NOVOLOG) 100 UNIT/ML injection Inject into the skin 3 (three) times daily before meals.   Yes [provider]  Insulin Degludec 100 UNIT/ML SOLN Inject into the skin.   Yes [provider]  loratadine (CLARITIN) 10 MG tablet Take 10 mg by mouth daily.   Yes [provider]  losartan (COZAAR) 25 MG tablet Take 25 mg by mouth daily.   Yes [provider]  methimazole (TAPAZOLE) 5 MG tablet Take 5 mg by mouth 3 (three) times daily.   Yes [provider]  methocarbamol (ROBAXIN) 500 MG tablet Take 500 mg by mouth 4 (four) times daily.   Yes [provider]  mirtazapine (REMERON) 15 MG tablet Take 15  mg by mouth at bedtime.   Yes [provider]  ondansetron (ZOFRAN) 4 MG tablet Take 4 mg by mouth every 8 (eight) hours as needed for nausea or vomiting.   Yes [provider]  oxybutynin (DITROPAN-XL) 10 MG 24 hr tablet Take 10 mg by mouth at bedtime.   Yes [provider]  pantoprazole (PROTONIX) 40 MG tablet Take 40 mg by mouth daily.   Yes [provider]  sertraline (ZOLOFT) 100 MG tablet Take 100 mg by mouth daily.   Yes [provider]  sildenafil (VIAGRA) 50 MG tablet Take 50 mg by mouth daily as needed for erectile dysfunction.   Yes [provider]  traMADol (ULTRAM) 50 MG tablet Take by mouth every 6 (six) hours as needed.   Yes [provider]  ARIPiprazole ER (ABILIFY MAINTENA) 400 MG PRSY Inject 400 mg into the muscle every 30 (thirty) days.    [provider]  insulin glargine (LANTUS) 100 UNIT/ML injection Inject 0.35  mLs (35 Units total) into the skin daily. Patient not taking: Reported on 12/08/2015 12/03/15 12/09/15  Enedina FinnerPatel, Sona, MD    Family History Family History  Problem Relation Age of Onset  . Heart disease Mother   . Asthma Mother   . Stroke Mother   . COPD Mother        maternal un  . Diabetes Father   . Hyperlipidemia Father   . Hypertension Father   . Depression Father   . Emphysema Maternal Uncle   . Asthma Sister   . Alzheimer's disease Maternal Grandmother   . Breast cancer Maternal Aunt     Social History Social History   Tobacco Use  . Smoking status: Current Some Day Smoker    Packs/day: 0.20    Years: 17.00    Pack years: 3.40    Types: Cigarettes    Last attempt to quit: 02/24/2011    Years since quitting: 6.5  . Smokeless tobacco: Former Engineer, waterUser  Substance Use Topics  . Alcohol use: No    Alcohol/week: 0.0 oz    Comment: socially  . Drug use: Yes    Types: Cocaine    Comment: former cocaine and use     Allergies   Nystatin; Rosuvastatin; Statins; Iodine; and  Shellfish-derived products   Review of Systems Review of Systems  Constitutional: Negative for chills and fever.  Gastrointestinal: Positive for abdominal pain and rectal pain. Negative for nausea and vomiting.  Genitourinary: Positive for difficulty urinating and dysuria. Negative for discharge and testicular pain.  Musculoskeletal: Positive for back pain.  Allergic/Immunologic: Positive for immunocompromised state (T2DM).  Neurological: Positive for weakness. Negative for numbness.  All other systems reviewed and are negative.    Physical Exam Updated Vital Signs BP (!) 168/119 (BP Location: Right Arm)   Pulse 98   Temp 98.2 F (36.8 C) (Oral)   Resp 18   Ht 6\' 1"  (1.854 m)   Wt 77.1 kg (170 lb)   SpO2 100%   BMI 22.43 kg/m   Physical Exam  Constitutional: He is oriented to person, place, and time. He appears well-developed and well-nourished. No distress.  HENT:  Head: Normocephalic and atraumatic.  Eyes: Conjunctivae are normal.  Neck: Normal range of motion. Neck supple.  Cardiovascular: Normal rate, regular rhythm, normal heart sounds and intact distal pulses.  Pulmonary/Chest: Effort normal and breath sounds normal. No respiratory distress.  Abdominal: Soft. Bowel sounds are normal. He exhibits no distension and no mass. There is tenderness (suprapubic). There is no rebound and no guarding.  Genitourinary: Testes normal. Prostate is tender. Right testis shows no mass, no swelling and no tenderness. Left testis shows no mass, no swelling and no tenderness. Circumcised. Penile tenderness (proximal shaft) present. No discharge found.  Genitourinary Comments: Exam performed with chaperone present.  Decreased rectal tone.  Patient with stool at the anus.  Tenderness noted with palpation of prostate.  Musculoskeletal:  No Midline spinal or paraspinal tenderness, no bony step-offs or gross deformities. Left fifth toe without deformity or color change.  TTP.  Pain with range  of motion.  Neurological: He is alert and oriented to person, place, and time.  4/5 strength right lower extremity, 3/5 strength left lower extremity.  Normal sensation. Normal tone.  Skin: Skin is warm.  Psychiatric: He has a normal mood and affect. His behavior is normal.  Nursing note and vitals reviewed.    ED Treatments / Results  Labs (all labs ordered are listed, but only abnormal  results are displayed) Labs Reviewed  URINALYSIS, ROUTINE W REFLEX MICROSCOPIC - Abnormal; Notable for the following components:      Result Value   Glucose, UA >=500 (*)    All other components within normal limits  URINALYSIS, MICROSCOPIC (REFLEX) - Abnormal; Notable for the following components:   Bacteria, UA RARE (*)    Squamous Epithelial / LPF 0-5 (*)    All other components within normal limits  CBC WITH DIFFERENTIAL/PLATELET - Abnormal; Notable for the following components:   WBC 3.7 (*)    RBC 3.20 (*)    Hemoglobin 10.1 (*)    HCT 29.5 (*)    Platelets 142 (*)    Neutro Abs 1.6 (*)    All other components within normal limits  BASIC METABOLIC PANEL - Abnormal; Notable for the following components:   Glucose, Bld 228 (*)    All other components within normal limits  CBG MONITORING, ED - Abnormal; Notable for the following components:   Glucose-Capillary 211 (*)    All other components within normal limits  GC/CHLAMYDIA PROBE AMP ([place]) NOT AT [facility]    EKG  EKG Interpretation None       Radiology Dg Foot Complete Left  Result Date: 09/19/2017 CLINICAL DATA:  Little toe pain secondary to trauma last week. Previous fractures of the fifth metatarsal and of the proximal phalanx of the little toe. EXAM: LEFT FOOT - COMPLETE 3+ VIEW COMPARISON:  03/08/2017 FINDINGS: There are old healed fractures of the distal fifth metatarsal and of the proximal phalanx of the little toe. No acute abnormalities. Slight arthritic changes of the first MTP joint. Plantar calcaneal enthesophyte.  IMPRESSION: No acute abnormality. Electronically Signed   By: Francene Boyers M.D.   On: 09/19/2017 17:17    Procedures Procedures (including critical care time)  Medications Ordered in ED Medications - No data to display   Initial Impression / Assessment and Plan / ED Course  I have reviewed the triage vital signs and the nursing notes.  Pertinent labs & imaging results that were available during my care of the patient were reviewed by me and considered in my medical decision making (see chart for details).     Patient with past medical history of insulin-dependent type 2 diabetes, chronic inflammatory demyelinating polyneuropathy, presenting to the ED with new onset of urinary and bowel incontinence, lower back pain, increased weakness to lower extremities.  Weakness to lower extremities, however patient reports worse  than normal.  Reports recent nontraumatic fall to knees.  On exam, pt is well-appearing, not in distress. Afebrile, VSS. Tenderness to penile shaft, and prostate.  GC/chlamydia collected.  Decreased rectal tone.  4/5 strength right lower extremity and 3/5 strength left the left lower extremity.  UA with few bacteria.  No leukocytosis.  CBG 211.  X-ray of left foot without acute pathology.  Given patient's presentation with new neurologic symptoms, concern for cauda equina.  Patient to be transferred to [facility] ER for MRI to rule out cauda equina.  If MRI is negative, p;t safe for discharge with antibiotics for acute prostatitis, and follow up with his neurologist.  Patient discussed with and evaluated by Dr. Criss Alvine who agrees with care plan.  Patient also discussed with Dr. Hyacinth Meeker at [facility] ER, anticipating arrival.  Pt is stable for transfer. Final Clinical Impressions(s) / ED Diagnoses   Final diagnoses:  None    ED Discharge Orders    None       Roxan Hockey,  [place] N, PA-C 09/19/17 2123    Pricilla Loveless, MD 09/20/17 [phone]

## 2017-09-19 NOTE — ED Notes (Signed)
Pt also c/o of a foot injury. States he hit it on a step.

## 2017-09-19 NOTE — ED Notes (Signed)
Called [facility]

## 2017-09-19 NOTE — ED Triage Notes (Signed)
Pt states he has not been able to urinate well x 2 days. Pt states it is like a "dribble" when he urinates. Pt also c/o flank pain on the L.

## 2017-09-20 ENCOUNTER — Emergency Department (HOSPITAL_COMMUNITY): Payer: BLUE CROSS/BLUE SHIELD

## 2017-09-20 LAB — GC/CHLAMYDIA PROBE AMP (~~LOC~~) NOT AT ARMC
Chlamydia: NEGATIVE
Neisseria Gonorrhea: NEGATIVE

## 2017-09-20 MED ORDER — CEFTRIAXONE SODIUM 1 G IJ SOLR
1.0000 g | Freq: Once | INTRAMUSCULAR | Status: AC
  Administered 2017-09-20: 1 g via INTRAMUSCULAR
  Filled 2017-09-20: qty 10

## 2017-09-20 MED ORDER — LIDOCAINE HCL (PF) 1 % IJ SOLN
INTRAMUSCULAR | Status: AC
  Administered 2017-09-20: 2 mL
  Filled 2017-09-20: qty 5

## 2017-09-20 MED ORDER — SODIUM CHLORIDE 0.9 % IV SOLN: 1.0000 g | Freq: Once | INTRAVENOUS | Status: DC

## 2017-09-20 MED ORDER — CEPHALEXIN 500 MG PO CAPS: 500.0000 mg | ORAL_CAPSULE | Freq: Two times a day (BID) | ORAL | 0 refills | Status: DC

## 2017-09-20 NOTE — ED Provider Notes (Signed)
Patient transferred from [facility] for concern over possible cauda equina syndrome.  Patient has a history of chronic back pain and chronic lower extremity weakness, but felt like his legs were weaker than normal and had some issues with urinary retention.  Patient sent for MRI.  His MRI has been performed here and does not show any significant pathology.  He did have tenderness with palpation of his prostate and likely his urinary symptoms, pain is secondary to prostatitis.  We will treat with antibiotics, follow-up with PCP in 1 week for recheck, return if symptoms worsen.   Gilda CreasePollina, Christopher J, MD 09/20/17 [phone]

## 2017-09-20 NOTE — ED Notes (Signed)
Pt transported to MRI 

## 2017-09-20 NOTE — ED Notes (Signed)
Pt back from MRI. NAD noted.

## 2017-10-04 ENCOUNTER — Ambulatory Visit: Payer: Self-pay | Admitting: Podiatry

## 2017-10-18 ENCOUNTER — Ambulatory Visit (INDEPENDENT_AMBULATORY_CARE_PROVIDER_SITE_OTHER): Payer: BLUE CROSS/BLUE SHIELD

## 2017-10-18 ENCOUNTER — Ambulatory Visit: Payer: BLUE CROSS/BLUE SHIELD | Admitting: Podiatry

## 2017-10-18 ENCOUNTER — Encounter: Payer: Self-pay | Admitting: Podiatry

## 2017-10-18 VITALS — BP 103/66 | HR 99 | Resp 16

## 2017-10-18 DIAGNOSIS — S92502A Displaced unspecified fracture of left lesser toe(s), initial encounter for closed fracture: Secondary | ICD-10-CM

## 2017-10-18 DIAGNOSIS — G5620 Lesion of ulnar nerve, unspecified upper limb: Secondary | ICD-10-CM | POA: Insufficient documentation

## 2017-10-18 DIAGNOSIS — S92502D Displaced unspecified fracture of left lesser toe(s), subsequent encounter for fracture with routine healing: Secondary | ICD-10-CM | POA: Diagnosis not present

## 2017-10-18 DIAGNOSIS — B351 Tinea unguium: Secondary | ICD-10-CM

## 2017-10-18 DIAGNOSIS — M79676 Pain in unspecified toe(s): Secondary | ICD-10-CM | POA: Diagnosis not present

## 2017-10-18 DIAGNOSIS — J302 Other seasonal allergic rhinitis: Secondary | ICD-10-CM | POA: Insufficient documentation

## 2017-10-18 NOTE — Progress Notes (Signed)
He presents today chief complaint of the painful fifth metatarsal times the past several months states that he stopped it several months ago since his feet tend to roll and he would like to consider different shoes or braces or something.  He is also requesting nail care.  Objective: Vital signs are stable alert and oriented x3.  Pulses are palpable.  Neurologic sensorium and complete loss of sensation plantar aspect of bilateral foot muscles only plus 4 out of 5 dorsiflexors plantar flexors inverters everters all intrinsic musculature appears to be weak and with intrinsic wasting.  He also has intrinsic wasting of the muscles in the hands as well.  He ambulates with a bit of a scissor type gait.  His toenails are thick yellow dystrophic-like mycotic painful palpation as well as debridement.  No reproducible pain on palpation of the fifth metatarsal left.  Radiographs taken today demonstrate a telescopic fracture which is oblique in nature along the fifth metatarsal from distal to proximal is shortened considerably.  It appears to have gone on to heal uneventfully.  Assessment severe diabetic peripheral neuropathy and he also has a dyskinesia type disease associated with this.  Healing fracture fifth metatarsal left foot.  Plan: Possibly consider getting him him into some type of AFO for support so they will continue to roll his ankles also debrided his toenails 1 through 5 bilateral.

## 2017-10-25 ENCOUNTER — Other Ambulatory Visit: Payer: BLUE CROSS/BLUE SHIELD | Admitting: Orthotics

## 2017-10-27 ENCOUNTER — Observation Stay (HOSPITAL_BASED_OUTPATIENT_CLINIC_OR_DEPARTMENT_OTHER): Payer: BLUE CROSS/BLUE SHIELD

## 2017-10-27 ENCOUNTER — Inpatient Hospital Stay (HOSPITAL_COMMUNITY)
Admission: EM | Admit: 2017-10-27 | Discharge: 2017-10-29 | DRG: 313 | Disposition: A | Discharge status: Home / Self Care | Payer: BLUE CROSS/BLUE SHIELD | Attending: Internal Medicine | Admitting: Internal Medicine

## 2017-10-27 ENCOUNTER — Encounter (HOSPITAL_COMMUNITY): Payer: Self-pay | Admitting: Emergency Medicine

## 2017-10-27 ENCOUNTER — Other Ambulatory Visit: Payer: Self-pay

## 2017-10-27 ENCOUNTER — Emergency Department (HOSPITAL_COMMUNITY): Payer: BLUE CROSS/BLUE SHIELD

## 2017-10-27 DIAGNOSIS — Z7982 Long term (current) use of aspirin: Secondary | ICD-10-CM

## 2017-10-27 DIAGNOSIS — Z87442 Personal history of urinary calculi: Secondary | ICD-10-CM

## 2017-10-27 DIAGNOSIS — J441 Chronic obstructive pulmonary disease with (acute) exacerbation: Secondary | ICD-10-CM | POA: Diagnosis present

## 2017-10-27 DIAGNOSIS — E1169 Type 2 diabetes mellitus with other specified complication: Secondary | ICD-10-CM | POA: Diagnosis present

## 2017-10-27 DIAGNOSIS — Z91199 Patient's noncompliance with other medical treatment and regimen due to unspecified reason: Secondary | ICD-10-CM

## 2017-10-27 DIAGNOSIS — J449 Chronic obstructive pulmonary disease, unspecified: Secondary | ICD-10-CM | POA: Diagnosis present

## 2017-10-27 DIAGNOSIS — E0590 Thyrotoxicosis, unspecified without thyrotoxic crisis or storm: Secondary | ICD-10-CM | POA: Diagnosis present

## 2017-10-27 DIAGNOSIS — R079 Chest pain, unspecified: Secondary | ICD-10-CM

## 2017-10-27 DIAGNOSIS — Z91013 Allergy to seafood: Secondary | ICD-10-CM

## 2017-10-27 DIAGNOSIS — E1110 Type 2 diabetes mellitus with ketoacidosis without coma: Secondary | ICD-10-CM | POA: Diagnosis present

## 2017-10-27 DIAGNOSIS — I952 Hypotension due to drugs: Secondary | ICD-10-CM | POA: Diagnosis present

## 2017-10-27 DIAGNOSIS — F1410 Cocaine abuse, uncomplicated: Secondary | ICD-10-CM | POA: Diagnosis present

## 2017-10-27 DIAGNOSIS — Z888 Allergy status to other drugs, medicaments and biological substances status: Secondary | ICD-10-CM

## 2017-10-27 DIAGNOSIS — E440 Moderate protein-calorie malnutrition: Secondary | ICD-10-CM | POA: Diagnosis present

## 2017-10-27 DIAGNOSIS — F339 Major depressive disorder, recurrent, unspecified: Secondary | ICD-10-CM | POA: Diagnosis present

## 2017-10-27 DIAGNOSIS — Z6821 Body mass index (BMI) 21.0-21.9, adult: Secondary | ICD-10-CM

## 2017-10-27 DIAGNOSIS — R072 Precordial pain: Secondary | ICD-10-CM | POA: Diagnosis not present

## 2017-10-27 DIAGNOSIS — Z9119 Patient's noncompliance with other medical treatment and regimen: Secondary | ICD-10-CM

## 2017-10-27 DIAGNOSIS — K589 Irritable bowel syndrome without diarrhea: Secondary | ICD-10-CM | POA: Diagnosis present

## 2017-10-27 DIAGNOSIS — F17219 Nicotine dependence, cigarettes, with unspecified nicotine-induced disorders: Secondary | ICD-10-CM

## 2017-10-27 DIAGNOSIS — E1165 Type 2 diabetes mellitus with hyperglycemia: Secondary | ICD-10-CM | POA: Diagnosis present

## 2017-10-27 DIAGNOSIS — Z96652 Presence of left artificial knee joint: Secondary | ICD-10-CM | POA: Diagnosis present

## 2017-10-27 DIAGNOSIS — I10 Essential (primary) hypertension: Secondary | ICD-10-CM | POA: Diagnosis present

## 2017-10-27 DIAGNOSIS — Z79899 Other long term (current) drug therapy: Secondary | ICD-10-CM

## 2017-10-27 DIAGNOSIS — Z720 Tobacco use: Secondary | ICD-10-CM | POA: Diagnosis present

## 2017-10-27 DIAGNOSIS — E1122 Type 2 diabetes mellitus with diabetic chronic kidney disease: Secondary | ICD-10-CM | POA: Diagnosis present

## 2017-10-27 DIAGNOSIS — F17210 Nicotine dependence, cigarettes, uncomplicated: Secondary | ICD-10-CM | POA: Diagnosis present

## 2017-10-27 DIAGNOSIS — G4733 Obstructive sleep apnea (adult) (pediatric): Secondary | ICD-10-CM | POA: Diagnosis present

## 2017-10-27 DIAGNOSIS — Z7951 Long term (current) use of inhaled steroids: Secondary | ICD-10-CM

## 2017-10-27 DIAGNOSIS — G6181 Chronic inflammatory demyelinating polyneuritis: Secondary | ICD-10-CM | POA: Diagnosis present

## 2017-10-27 DIAGNOSIS — E785 Hyperlipidemia, unspecified: Secondary | ICD-10-CM | POA: Diagnosis present

## 2017-10-27 DIAGNOSIS — Z590 Homelessness: Secondary | ICD-10-CM

## 2017-10-27 DIAGNOSIS — K219 Gastro-esophageal reflux disease without esophagitis: Secondary | ICD-10-CM | POA: Diagnosis present

## 2017-10-27 DIAGNOSIS — R739 Hyperglycemia, unspecified: Secondary | ICD-10-CM | POA: Diagnosis present

## 2017-10-27 DIAGNOSIS — N184 Chronic kidney disease, stage 4 (severe): Secondary | ICD-10-CM | POA: Diagnosis present

## 2017-10-27 HISTORY — DX: Low back pain, unspecified: M54.50

## 2017-10-27 HISTORY — DX: Thyrotoxicosis, unspecified without thyrotoxic crisis or storm: E05.90

## 2017-10-27 HISTORY — DX: Low back pain: M54.5

## 2017-10-27 HISTORY — DX: Anxiety disorder, unspecified: F41.9

## 2017-10-27 HISTORY — DX: Cardiac murmur, unspecified: R01.1

## 2017-10-27 HISTORY — DX: Type 2 diabetes mellitus without complications: E11.9

## 2017-10-27 HISTORY — DX: Migraine, unspecified, not intractable, without status migrainosus: G43.909

## 2017-10-27 HISTORY — DX: Headache: R51

## 2017-10-27 HISTORY — DX: Chronic kidney disease, stage 4 (severe): N18.4

## 2017-10-27 HISTORY — DX: Unspecified chronic bronchitis: J42

## 2017-10-27 HISTORY — DX: Other chronic pain: G89.29

## 2017-10-27 HISTORY — DX: Personal history of urinary calculi: Z87.442

## 2017-10-27 HISTORY — DX: Unspecified osteoarthritis, unspecified site: M19.90

## 2017-10-27 HISTORY — DX: Headache, unspecified: R51.9

## 2017-10-27 LAB — CREATININE, SERUM: CREATININE: 1.24 mg/dL (ref 0.61–1.24)

## 2017-10-27 LAB — TROPONIN I: Troponin I: <0.03 ng/mL (ref <0.03)

## 2017-10-27 LAB — RAPID URINE DRUG SCREEN, HOSP PERFORMED
AMPHETAMINES: NOT DETECTED
BARBITURATES: NOT DETECTED
Benzodiazepines: NOT DETECTED
Cocaine: POSITIVE — AB
OPIATES: NOT DETECTED
TETRAHYDROCANNABINOL: NOT DETECTED

## 2017-10-27 LAB — ECHOCARDIOGRAM COMPLETE
HEIGHTINCHES: 73 in
WEIGHTICAEL: 2720 [oz_av]

## 2017-10-27 LAB — URINALYSIS, ROUTINE W REFLEX MICROSCOPIC
Bilirubin Urine: NEGATIVE
KETONES UR: 80 mg/dL — AB
Leukocytes, UA: NEGATIVE
NITRITE: NEGATIVE
PH: 6 (ref 5.0–8.0)
Protein, ur: 100 mg/dL — AB
SPECIFIC GRAVITY, URINE: 1.028 (ref 1.005–1.030)
Squamous Epithelial / LPF: NONE SEEN

## 2017-10-27 LAB — CBC
HCT: 32 % — ABNORMAL LOW (ref 39.0–52.0)
HCT: 31.8 % — ABNORMAL LOW (ref 39.0–52.0)
Hemoglobin: 10.6 g/dL — ABNORMAL LOW (ref 13.0–17.0)
Hemoglobin: 10.8 g/dL — ABNORMAL LOW (ref 13.0–17.0)
MCH: 29.9 pg (ref 26.0–34.0)
MCH: 30.7 pg (ref 26.0–34.0)
MCHC: 33.1 g/dL (ref 30.0–36.0)
MCHC: 34 g/dL (ref 30.0–36.0)
MCV: 90.4 fL (ref 78.0–100.0)
MCV: 90.3 fL (ref 78.0–100.0)
PLATELETS: 205 10*3/uL (ref 150–400)
PLATELETS: 181 10*3/uL (ref 150–400)
RBC: 3.54 MIL/uL — AB (ref 4.22–5.81)
RBC: 3.52 MIL/uL — ABNORMAL LOW (ref 4.22–5.81)
RDW: 12.3 % (ref 11.5–15.5)
RDW: 12.7 % (ref 11.5–15.5)
WBC: 4 10*3/uL (ref 4.0–10.5)
WBC: 2.9 10*3/uL — AB (ref 4.0–10.5)

## 2017-10-27 LAB — BASIC METABOLIC PANEL
ANION GAP: 14 (ref 5–15)
ANION GAP: 10 (ref 5–15)
Anion gap: 17 — ABNORMAL HIGH (ref 5–15)
Anion gap: 14 (ref 5–15)
BUN: 17 mg/dL (ref 6–20)
BUN: 15 mg/dL (ref 6–20)
BUN: 14 mg/dL (ref 6–20)
BUN: 16 mg/dL (ref 6–20)
CALCIUM: 8.8 mg/dL — AB (ref 8.9–10.3)
CALCIUM: 8.6 mg/dL — AB (ref 8.9–10.3)
CHLORIDE: 104 mmol/L (ref 101–111)
CO2: 19 mmol/L — ABNORMAL LOW (ref 22–32)
CO2: 20 mmol/L — ABNORMAL LOW (ref 22–32)
CO2: 20 mmol/L — ABNORMAL LOW (ref 22–32)
CO2: 24 mmol/L (ref 22–32)
CREATININE: 1.25 mg/dL — AB (ref 0.61–1.24)
CREATININE: 1.21 mg/dL (ref 0.61–1.24)
Calcium: 9.1 mg/dL (ref 8.9–10.3)
Calcium: 8.5 mg/dL — ABNORMAL LOW (ref 8.9–10.3)
Chloride: 104 mmol/L (ref 101–111)
Chloride: 104 mmol/L (ref 101–111)
Chloride: 106 mmol/L (ref 101–111)
Creatinine, Ser: 1.17 mg/dL (ref 0.61–1.24)
Creatinine, Ser: 1.14 mg/dL (ref 0.61–1.24)
GFR calc Af Amer: >60 mL/min (ref >60)
GFR calc Af Amer: >60 mL/min (ref >60)
GLUCOSE: 235 mg/dL — AB (ref 65–99)
Glucose, Bld: 234 mg/dL — ABNORMAL HIGH (ref 65–99)
Glucose, Bld: 233 mg/dL — ABNORMAL HIGH (ref 65–99)
Glucose, Bld: 185 mg/dL — ABNORMAL HIGH (ref 65–99)
POTASSIUM: 4 mmol/L (ref 3.5–5.1)
Potassium: 3.7 mmol/L (ref 3.5–5.1)
Potassium: 3.9 mmol/L (ref 3.5–5.1)
Potassium: 3.5 mmol/L (ref 3.5–5.1)
SODIUM: 140 mmol/L (ref 135–145)
SODIUM: 140 mmol/L (ref 135–145)
Sodium: 138 mmol/L (ref 135–145)
Sodium: 138 mmol/L (ref 135–145)

## 2017-10-27 LAB — I-STAT VENOUS BLOOD GAS, ED
ACID-BASE DEFICIT: 5 mmol/L — AB (ref 0.0–2.0)
BICARBONATE: 22 mmol/L (ref 20.0–28.0)
O2 SAT: 86 %
PCO2 VEN: 46.1 mmHg (ref 44.0–60.0)
PO2 VEN: 58 mmHg — AB (ref 32.0–45.0)
TCO2: 23 mmol/L (ref 22–32)
pH, Ven: 7.287 (ref 7.250–7.430)

## 2017-10-27 LAB — CBG MONITORING, ED
Glucose-Capillary: 227 mg/dL — ABNORMAL HIGH (ref 65–99)
Glucose-Capillary: 205 mg/dL — ABNORMAL HIGH (ref 65–99)
Glucose-Capillary: 200 mg/dL — ABNORMAL HIGH (ref 65–99)

## 2017-10-27 LAB — I-STAT TROPONIN, ED
Troponin i, poc: 0 ng/mL (ref 0.00–0.08)
Troponin i, poc: 0 ng/mL (ref 0.00–0.08)

## 2017-10-27 LAB — MRSA PCR SCREENING: MRSA BY PCR: NEGATIVE

## 2017-10-27 LAB — HEMOGLOBIN A1C
HEMOGLOBIN A1C: 10.6 % — AB (ref 4.8–5.6)
MEAN PLASMA GLUCOSE: 257.52 mg/dL

## 2017-10-27 LAB — GLUCOSE, CAPILLARY
Glucose-Capillary: 136 mg/dL — ABNORMAL HIGH (ref 65–99)
Glucose-Capillary: 192 mg/dL — ABNORMAL HIGH (ref 65–99)

## 2017-10-27 LAB — HIV ANTIBODY (ROUTINE TESTING W REFLEX): HIV SCREEN 4TH GENERATION: NONREACTIVE

## 2017-10-27 MED ORDER — HYDROXYZINE PAMOATE 25 MG PO CAPS
25.0000 mg | ORAL_CAPSULE | Freq: Three times a day (TID) | ORAL | Status: DC | PRN
  Filled 2017-10-27: qty 1

## 2017-10-27 MED ORDER — MORPHINE SULFATE (PF) 4 MG/ML IV SOLN: 2.0000 mg | INTRAVENOUS | Status: DC | PRN

## 2017-10-27 MED ORDER — GLUCOSAMINE-CHONDROITIN 500-400 MG PO TABS: 1.0000 | ORAL_TABLET | Freq: Three times a day (TID) | ORAL | Status: DC

## 2017-10-27 MED ORDER — INSULIN ASPART 100 UNIT/ML ~~LOC~~ SOLN
3.0000 [IU] | Freq: Three times a day (TID) | SUBCUTANEOUS | Status: DC
  Administered 2017-10-27 – 2017-10-29 (×7): 3 [IU] via SUBCUTANEOUS
  Filled 2017-10-27 (×2): qty 1

## 2017-10-27 MED ORDER — MOMETASONE FURO-FORMOTEROL FUM 100-5 MCG/ACT IN AERO
2.0000 | INHALATION_SPRAY | Freq: Two times a day (BID) | RESPIRATORY_TRACT | Status: DC
  Administered 2017-10-27 – 2017-10-29 (×3): 2 via RESPIRATORY_TRACT
  Filled 2017-10-27: qty 8.8

## 2017-10-27 MED ORDER — ONDANSETRON HCL 4 MG/2ML IJ SOLN
4.0000 mg | Freq: Three times a day (TID) | INTRAMUSCULAR | Status: DC | PRN
Start: 2017-10-27 — End: 2017-10-29

## 2017-10-27 MED ORDER — MIRTAZAPINE 15 MG PO TABS
15.0000 mg | ORAL_TABLET | Freq: Every day | ORAL | Status: DC
  Administered 2017-10-27: 15 mg via ORAL
  Filled 2017-10-27: qty 1

## 2017-10-27 MED ORDER — INSULIN ASPART 100 UNIT/ML ~~LOC~~ SOLN
0.0000 [IU] | Freq: Three times a day (TID) | SUBCUTANEOUS | Status: DC
  Administered 2017-10-27: 3 [IU] via SUBCUTANEOUS
  Administered 2017-10-27: 2 [IU] via SUBCUTANEOUS
  Administered 2017-10-27: 5 [IU] via SUBCUTANEOUS
  Administered 2017-10-28: 8 [IU] via SUBCUTANEOUS
  Administered 2017-10-28 – 2017-10-29 (×4): 5 [IU] via SUBCUTANEOUS
  Filled 2017-10-27 (×2): qty 1

## 2017-10-27 MED ORDER — IBUPROFEN 800 MG PO TABS
800.0000 mg | ORAL_TABLET | Freq: Four times a day (QID) | ORAL | Status: DC | PRN
Start: 2017-10-27 — End: 2017-10-29
  Filled 2017-10-27: qty 1

## 2017-10-27 MED ORDER — LORATADINE 10 MG PO TABS
10.0000 mg | ORAL_TABLET | Freq: Every day | ORAL | Status: DC
  Administered 2017-10-27 – 2017-10-29 (×3): 10 mg via ORAL
  Filled 2017-10-27 (×3): qty 1

## 2017-10-27 MED ORDER — OXYBUTYNIN CHLORIDE ER 10 MG PO TB24
10.0000 mg | ORAL_TABLET | Freq: Every day | ORAL | Status: DC
  Administered 2017-10-27 – 2017-10-28 (×2): 10 mg via ORAL
  Filled 2017-10-27 (×3): qty 1

## 2017-10-27 MED ORDER — DIAZEPAM 5 MG PO TABS: 5.0000 mg | ORAL_TABLET | Freq: Four times a day (QID) | ORAL | Status: DC | PRN

## 2017-10-27 MED ORDER — INSULIN ASPART 100 UNIT/ML ~~LOC~~ SOLN
0.0000 [IU] | Freq: Every day | SUBCUTANEOUS | Status: DC
  Administered 2017-10-28: 2 [IU] via SUBCUTANEOUS

## 2017-10-27 MED ORDER — VITAMIN D 1000 UNITS PO TABS
1000.0000 [IU] | ORAL_TABLET | Freq: Every day | ORAL | Status: DC
  Administered 2017-10-27 – 2017-10-29 (×3): 1000 [IU] via ORAL
  Filled 2017-10-27 (×3): qty 1

## 2017-10-27 MED ORDER — METHIMAZOLE 5 MG PO TABS
5.0000 mg | ORAL_TABLET | ORAL | Status: DC
  Administered 2017-10-27 – 2017-10-29 (×2): 5 mg via ORAL
  Filled 2017-10-27 (×2): qty 1

## 2017-10-27 MED ORDER — DIVALPROEX SODIUM ER 250 MG PO TB24
250.0000 mg | ORAL_TABLET | Freq: Every day | ORAL | Status: DC
  Administered 2017-10-27 – 2017-10-29 (×3): 250 mg via ORAL
  Filled 2017-10-27 (×3): qty 1

## 2017-10-27 MED ORDER — TRAMADOL HCL 50 MG PO TABS
50.0000 mg | ORAL_TABLET | Freq: Four times a day (QID) | ORAL | Status: DC | PRN
Start: 2017-10-27 — End: 2017-10-29

## 2017-10-27 MED ORDER — SODIUM CHLORIDE 0.9 % IV SOLN: INTRAVENOUS | Status: DC

## 2017-10-27 MED ORDER — GABAPENTIN 300 MG PO CAPS
300.0000 mg | ORAL_CAPSULE | Freq: Three times a day (TID) | ORAL | Status: DC
  Administered 2017-10-27 – 2017-10-28 (×3): 300 mg via ORAL
  Filled 2017-10-27 (×3): qty 1

## 2017-10-27 MED ORDER — HYDRALAZINE HCL 20 MG/ML IJ SOLN
5.0000 mg | INTRAMUSCULAR | Status: DC | PRN
  Filled 2017-10-27: qty 1

## 2017-10-27 MED ORDER — ALBUTEROL SULFATE (2.5 MG/3ML) 0.083% IN NEBU
2.5000 mg | INHALATION_SOLUTION | Freq: Two times a day (BID) | RESPIRATORY_TRACT | Status: DC
  Administered 2017-10-28 – 2017-10-29 (×2): 2.5 mg via RESPIRATORY_TRACT
  Filled 2017-10-27 (×2): qty 3

## 2017-10-27 MED ORDER — DEXTROSE-NACL 5-0.45 % IV SOLN: INTRAVENOUS | Status: DC

## 2017-10-27 MED ORDER — NITROGLYCERIN 0.4 MG SL SUBL: 0.4000 mg | SUBLINGUAL_TABLET | SUBLINGUAL | Status: DC | PRN

## 2017-10-27 MED ORDER — ENOXAPARIN SODIUM 40 MG/0.4ML ~~LOC~~ SOLN
40.0000 mg | Freq: Every day | SUBCUTANEOUS | Status: DC
  Administered 2017-10-27 – 2017-10-29 (×3): 40 mg via SUBCUTANEOUS
  Filled 2017-10-27 (×4): qty 0.4

## 2017-10-27 MED ORDER — ASPIRIN EC 81 MG PO TBEC
81.0000 mg | DELAYED_RELEASE_TABLET | Freq: Every day | ORAL | Status: DC
  Administered 2017-10-27 – 2017-10-29 (×3): 81 mg via ORAL
  Filled 2017-10-27 (×3): qty 1

## 2017-10-27 MED ORDER — AZATHIOPRINE 50 MG PO TABS
50.0000 mg | ORAL_TABLET | Freq: Every day | ORAL | Status: DC
  Administered 2017-10-27 – 2017-10-29 (×3): 50 mg via ORAL
  Filled 2017-10-27 (×3): qty 1

## 2017-10-27 MED ORDER — LOSARTAN POTASSIUM 25 MG PO TABS
25.0000 mg | ORAL_TABLET | Freq: Every day | ORAL | Status: DC
  Administered 2017-10-27 – 2017-10-28 (×2): 25 mg via ORAL
  Filled 2017-10-27 (×2): qty 1

## 2017-10-27 MED ORDER — POTASSIUM CHLORIDE 10 MEQ/100ML IV SOLN
10.0000 meq | INTRAVENOUS | Status: AC
  Administered 2017-10-27: 10 meq via INTRAVENOUS
  Filled 2017-10-27: qty 100

## 2017-10-27 MED ORDER — FLUTICASONE PROPIONATE 50 MCG/ACT NA SUSP
2.0000 | Freq: Every day | NASAL | Status: DC
  Administered 2017-10-27 – 2017-10-29 (×3): 2 via NASAL
  Filled 2017-10-27: qty 16

## 2017-10-27 MED ORDER — SODIUM CHLORIDE 0.9 % IV SOLN
INTRAVENOUS | Status: DC
  Administered 2017-10-27 – 2017-10-28 (×3): via INTRAVENOUS

## 2017-10-27 MED ORDER — SODIUM CHLORIDE 0.9 % IV BOLUS
2000.0000 mL | Freq: Once | INTRAVENOUS | Status: AC
  Administered 2017-10-27: 2000 mL via INTRAVENOUS

## 2017-10-27 MED ORDER — SODIUM CHLORIDE 0.9 % IV BOLUS
1000.0000 mL | Freq: Once | INTRAVENOUS | Status: AC
  Administered 2017-10-27: 1000 mL via INTRAVENOUS

## 2017-10-27 MED ORDER — METHOCARBAMOL 500 MG PO TABS: 500.0000 mg | ORAL_TABLET | Freq: Four times a day (QID) | ORAL | Status: DC | PRN

## 2017-10-27 MED ORDER — PANTOPRAZOLE SODIUM 40 MG PO TBEC
40.0000 mg | DELAYED_RELEASE_TABLET | Freq: Every day | ORAL | Status: DC
  Administered 2017-10-28 – 2017-10-29 (×2): 40 mg via ORAL
  Filled 2017-10-27 (×3): qty 1

## 2017-10-27 MED ORDER — ADULT MULTIVITAMIN W/MINERALS CH
1.0000 | ORAL_TABLET | Freq: Every day | ORAL | Status: DC
  Administered 2017-10-27 – 2017-10-29 (×3): 1 via ORAL
  Filled 2017-10-27 (×3): qty 1

## 2017-10-27 MED ORDER — SERTRALINE HCL 100 MG PO TABS
200.0000 mg | ORAL_TABLET | Freq: Every day | ORAL | Status: DC
  Administered 2017-10-27 – 2017-10-28 (×2): 200 mg via ORAL
  Filled 2017-10-27 (×2): qty 2

## 2017-10-27 MED ORDER — INSULIN ASPART 100 UNIT/ML ~~LOC~~ SOLN: 20.0000 [IU] | Freq: Three times a day (TID) | SUBCUTANEOUS | Status: DC

## 2017-10-27 MED ORDER — EZETIMIBE 10 MG PO TABS
10.0000 mg | ORAL_TABLET | Freq: Every day | ORAL | Status: DC
  Administered 2017-10-27 – 2017-10-29 (×3): 10 mg via ORAL
  Filled 2017-10-27 (×3): qty 1

## 2017-10-27 MED ORDER — ALBUTEROL SULFATE HFA 108 (90 BASE) MCG/ACT IN AERS: 2.0000 | INHALATION_SPRAY | Freq: Four times a day (QID) | RESPIRATORY_TRACT | Status: DC

## 2017-10-27 MED ORDER — ALBUTEROL SULFATE (2.5 MG/3ML) 0.083% IN NEBU
2.5000 mg | INHALATION_SOLUTION | Freq: Four times a day (QID) | RESPIRATORY_TRACT | Status: DC
  Administered 2017-10-27: 2.5 mg via RESPIRATORY_TRACT
  Filled 2017-10-27: qty 3

## 2017-10-27 MED ORDER — METOPROLOL TARTRATE 25 MG PO TABS
25.0000 mg | ORAL_TABLET | Freq: Two times a day (BID) | ORAL | Status: DC
  Administered 2017-10-27 – 2017-10-28 (×3): 25 mg via ORAL
  Filled 2017-10-27 (×3): qty 1

## 2017-10-27 MED ORDER — INSULIN GLARGINE 100 UNIT/ML ~~LOC~~ SOLN
45.0000 [IU] | Freq: Every day | SUBCUTANEOUS | Status: DC
  Administered 2017-10-27 – 2017-10-29 (×3): 45 [IU] via SUBCUTANEOUS
  Filled 2017-10-27 (×3): qty 0.45

## 2017-10-27 MED ORDER — ACETAMINOPHEN 325 MG PO TABS: 650.0000 mg | ORAL_TABLET | ORAL | Status: DC | PRN

## 2017-10-27 MED ORDER — BACLOFEN 10 MG PO TABS
10.0000 mg | ORAL_TABLET | Freq: Three times a day (TID) | ORAL | Status: DC
  Administered 2017-10-27 – 2017-10-28 (×3): 10 mg via ORAL
  Filled 2017-10-27 (×4): qty 1

## 2017-10-27 MED ORDER — BUPROPION HCL ER (XL) 150 MG PO TB24
300.0000 mg | ORAL_TABLET | Freq: Every day | ORAL | Status: DC
  Administered 2017-10-27 – 2017-10-29 (×3): 300 mg via ORAL
  Filled 2017-10-27 (×3): qty 2

## 2017-10-27 MED ORDER — VITAMIN B-12 1000 MCG PO TABS
1000.0000 ug | ORAL_TABLET | Freq: Every day | ORAL | Status: DC
  Administered 2017-10-27 – 2017-10-29 (×3): 1000 ug via ORAL
  Filled 2017-10-27 (×3): qty 1

## 2017-10-27 MED ORDER — INSULIN REGULAR HUMAN 100 UNIT/ML IJ SOLN
INTRAMUSCULAR | Status: DC
  Filled 2017-10-27: qty 1

## 2017-10-27 MED ORDER — GI COCKTAIL ~~LOC~~: 30.0000 mL | Freq: Four times a day (QID) | ORAL | Status: DC | PRN

## 2017-10-27 NOTE — ED Provider Notes (Signed)
[facility] 4E CV SURGICAL PROGRESSIVE CARE Provider Note   CSN: [id number] Arrival date & time: 10/27/17  0201     History   Chief Complaint Chief Complaint  Patient presents with  . Chest Pain    HPI Wayne [name] is a 42 y.o. male.  HPI  Presents with concern for chest pain, began around 130AM Felt pressure like something sitting on the chest and radiation to the right arm. Improved with nitroglycerin with EMS. Sitting up made it worse.  No other symptoms with the chest pain. No nausea vomiting, fevers.  Does report some cough and dyspnea, some wheezing. Nonproductive cough, cough started yesterday. No runny nose.  Mom has CHF at 34  DM, htn, cholesterol occ etoh Smoking cigarettes, denies other drug use.  repor  Past Medical History:  Diagnosis Date  . Anemia   . Anxiety   . Arthritis    "knees and hands" (10/27/2017)  . Asthma   . Bipolar 1 disorder (HCC)   . Chest pain   . Chronic bronchitis (HCC)   . Chronic lower back pain   . CIDP (chronic inflammatory demyelinating polyneuropathy) (HCC)   . CKD (chronic kidney disease), stage IV (HCC)   . COPD (chronic obstructive pulmonary disease) (HCC)   . Daily headache   . Depression   . GERD (gastroesophageal reflux disease)   . Heart murmur   . History of kidney stones   . HTN (hypertension)   . Hyperlipidemia   . Hyperthyroidism   . Hypogonadism in male   . IBS (irritable bowel syndrome)   . Ingrown nail 11/12/2013  . Lumbar disc disease   . Migraine    "monthly" (10/27/2017)  . OSA (obstructive sleep apnea)    "used to wear mask; just quit" (10/27/2017)  . Osteoarthritis   . Peripheral neuropathy 02/01/2011  . Polysubstance abuse (HCC)    cocaine, marijuana-quit summer 2010  . Tobacco abuse   . Type II diabetes mellitus [facility])     Patient Active Problem List   Diagnosis Date Noted  . Chest pain 10/27/2017  . Tobacco abuse 10/27/2017  . Seasonal allergies 10/18/2017  . Ulnar neuropathy 10/18/2017  . Closed  nondisplaced fracture of proximal phalanx of lesser toe of left foot 03/14/2017  . Subclinical hyperthyroidism 07/27/2016  . Cataract 05/14/2016  . CIDP (chronic inflammatory demyelinating polyneuropathy) (HCC) 03/07/2016  . Cocaine dependence with cocaine-induced mood disorder (HCC) 01/09/2016  . Protein-calorie malnutrition, severe 12/02/2015  . Diabetes mellitus with nonketotic hyperosmolarity (HCC) 11/30/2015  . Carpal tunnel syndrome on right 07/31/2015  . Lesion of right ulnar nerve 07/31/2015  . Cocaine abuse with intoxication (HCC) 01/25/2015  . 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 Somnolence 01/25/2015  . Major depressive disorder, recurrent severe without psychotic features (HCC)   . COPD (chronic obstructive pulmonary disease) (HCC) 06/12/2014  . Depression 06/12/2014  . Diabetic peripheral neuropathy (HCC) 06/12/2014  . Penetrating foot wound 06/12/2014  . Onychomycosis 01/04/2014  . Ingrown nail 11/12/2013  . Onychia and paronychia of toe 11/12/2013  . Deformity of metatarsal bone of right foot 08/17/2013  . Chronic pain 05/18/2013  . Low TSH level 07/29/2011  . Peripheral neuropathy 02/01/2011  . GERD 07/10/2010  . Obstructive sleep apnea 09/26/2009  . IBS 06/10/2009  . SCIATICA, RIGHT 06/02/2009  . Cocaine abuse (HCC) 01/10/2009  . Mixed, or nondependent drug abuse 01/10/2009  . LUNG NODULE 12/04/2007  . HYPOGONADISM, MALE 10/09/2007  . ERECTILE DYSFUNCTION 09/14/2007  .  Type 2 diabetes mellitus with hyperglycemia (HCC) 04/12/2007  . Type 2 diabetes mellitus with hyperlipidemia (HCC) 04/12/2007  . Depression, major, recurrent (HCC) 04/12/2007  . Essential hypertension 04/12/2007  . OSTEOARTHRITIS 04/12/2007    Past Surgical History:  Procedure Laterality Date  . BUNIONECTOMY WITH HAMMERTOE RECONSTRUCTION Bilateral 06/2009  . CARPAL TUNNEL RELEASE Right   . KNEE ARTHROSCOPY Left 1995  . LITHOTRIPSY          Home  Medications    Prior to Admission medications   Medication Sig Start Date End Date Taking? Authorizing Provider  albuterol (PROVENTIL HFA;VENTOLIN HFA) 108 (90 Base) MCG/ACT inhaler Inhale into the lungs every 6 (six) hours as needed for wheezing or shortness of breath.   Yes [provider]  aspirin EC 81 MG tablet Take 81 mg by mouth daily.   Yes [provider]  azaTHIOprine (IMURAN) 50 MG tablet Take 50 mg by mouth daily.   Yes [provider]  baclofen (LIORESAL) 10 MG tablet Take 10 mg by mouth 3 (three) times daily.   Yes [provider]  budesonide-formoterol (SYMBICORT) 80-4.5 MCG/ACT inhaler Inhale 2 puffs into the lungs 2 (two) times daily.   Yes [provider]  buPROPion (WELLBUTRIN XL) 300 MG 24 hr tablet Take 300 mg by mouth daily.   Yes [provider]  cholecalciferol (VITAMIN D) 1000 units tablet Take 1,000 Units by mouth daily.   Yes [provider]  clotrimazole (LOTRIMIN) 1 % cream Apply 1 application topically 2 (two) times daily.   Yes [provider]  cyanocobalamin 1000 MCG tablet Take 1,000 mcg by mouth daily.   Yes [provider]  diazepam (VALIUM) 5 MG tablet Take 5 mg by mouth every 6 (six) hours as needed for anxiety.   Yes [provider]  divalproex (DEPAKOTE ER) 250 MG 24 hr tablet Take 250 mg by mouth daily.   Yes [provider]  ezetimibe (ZETIA) 10 MG tablet Take 10 mg by mouth daily.   Yes [provider]  fluticasone (FLONASE) 50 MCG/ACT nasal spray Place 2 sprays into both nostrils daily.    Yes [provider]  gabapentin (NEURONTIN) 300 MG capsule Take 300 mg by mouth 3 (three) times daily.   Yes [provider]  glucosamine-chondroitin 500-400 MG tablet Take 1 tablet by mouth 3 (three) times daily.   Yes [provider]  hydrOXYzine (VISTARIL) 25 MG capsule Take 25 mg by mouth 3 (three) times daily as needed for anxiety.     Yes [provider]  ibuprofen (ADVIL,MOTRIN) 800 MG tablet Take 800 mg by mouth every 6 (six) hours as needed for moderate pain.   Yes [provider]  insulin aspart (NOVOLOG) 100 UNIT/ML injection Inject 20 Units into the skin 3 (three) times daily before meals.    Yes [provider]  Insulin Degludec 100 UNIT/ML SOLN Inject 45 Units into the skin every evening.    Yes [provider]  loratadine (CLARITIN) 10 MG tablet Take 10 mg by mouth daily.   Yes [provider]  losartan (COZAAR) 25 MG tablet Take 25 mg by mouth daily.   Yes [provider]  methimazole (TAPAZOLE) 5 MG tablet Take 5 mg by mouth every other day.    Yes [provider]  methocarbamol (ROBAXIN) 500 MG tablet Take 500 mg by mouth every 6 (six) hours as needed for muscle spasms.    Yes [provider]  mirtazapine (REMERON) 15  MG tablet Take 15 mg by mouth at bedtime.   Yes [provider]  Multiple Vitamin (MULTIVITAMIN WITH MINERALS) TABS tablet Take 1 tablet by mouth daily.   Yes [provider]  ondansetron (ZOFRAN) 4 MG tablet Take 4 mg by mouth every 8 (eight) hours as needed for nausea or vomiting.   Yes [provider]  oxybutynin (DITROPAN-XL) 10 MG 24 hr tablet Take 10 mg by mouth at bedtime.   Yes [provider]  pantoprazole (PROTONIX) 40 MG tablet Take 40 mg by mouth daily.   Yes [provider]  sertraline (ZOLOFT) 100 MG tablet Take 200 mg by mouth daily.    Yes [provider]  sildenafil (VIAGRA) 50 MG tablet Take 50 mg by mouth daily as needed for erectile dysfunction.   Yes [provider]  traMADol (ULTRAM) 50 MG tablet Take 50 mg by mouth every 8 (eight) hours as needed for moderate pain.    Yes [provider]  ARIPiprazole ER (ABILIFY MAINTENA) 400 MG PRSY Inject 400 mg into the muscle every 30 (thirty) days.    [provider]  cephALEXin (KEFLEX)  500 MG capsule Take 1 capsule (500 mg total) by mouth 2 (two) times daily. Patient not taking: Reported on 10/27/2017 09/20/17   Gilda Crease, MD    Family History Family History  Problem Relation Age of Onset  . Heart disease Mother   . Asthma Mother   . Stroke Mother   . COPD Mother        maternal un  . Diabetes Father   . Hyperlipidemia Father   . Hypertension Father   . Depression Father   . Emphysema Maternal Uncle   . Asthma Sister   . Alzheimer's disease Maternal Grandmother   . Breast cancer Maternal Aunt     Social History Social History   Tobacco Use  . Smoking status: Current Every Day Smoker    Packs/day: 0.25    Years: 24.00    Pack years: 6.00    Types: Cigarettes  . Smokeless tobacco: Never Used  Substance Use Topics  . Alcohol use: Yes    Comment: 10/27/2017 "might have a drink on major holidays"  . Drug use: Yes    Types: Cocaine    Comment: 10/27/2017 "nothing since 09/2016"     Allergies   Nystatin; Rosuvastatin; Statins; Iodine; and Shellfish-derived products   Review of Systems Review of Systems  Constitutional: Positive for fatigue. Negative for fever.  HENT: Negative for sore throat.   Eyes: Negative for visual disturbance.  Respiratory: Positive for cough and shortness of breath.   Cardiovascular: Positive for chest pain.  Gastrointestinal: Negative for abdominal pain, nausea and vomiting.  Genitourinary: Negative for difficulty urinating.  Musculoskeletal: Negative for back pain and neck stiffness.  Skin: Negative for rash.  Neurological: Negative for syncope and headaches.     Physical Exam Updated Vital Signs BP (!) 143/95 (BP Location: Left Arm)   Pulse 97   Temp 98.4 F (36.9 C) (Oral)   Resp 11   Ht 6\' 1"  (1.854 m)   Wt 73.2 kg (161 lb 4.8 oz)   SpO2 98%   BMI 21.28 kg/m   Physical Exam  Constitutional: He is oriented to person, place, and time. He appears well-developed and well-nourished. No distress.  HENT:   Head: Normocephalic and atraumatic.  Eyes: Conjunctivae and EOM are normal.  Neck: Normal range of motion.  Cardiovascular: Normal rate, regular rhythm, normal heart  sounds and intact distal pulses. Exam reveals no gallop and no friction rub.  No murmur heard. Pulmonary/Chest: Effort normal and breath sounds normal. No respiratory distress. He has no wheezes. He has no rales.  Abdominal: Soft. He exhibits no distension. There is no tenderness. There is no guarding.  Musculoskeletal: He exhibits no edema.  Neurological: He is alert and oriented to person, place, and time.  Skin: Skin is warm and dry. He is not diaphoretic.  Nursing note and vitals reviewed.    ED Treatments / Results  Labs (all labs ordered are listed, but only abnormal results are displayed) Labs Reviewed  BASIC METABOLIC PANEL - Abnormal; Notable for the following components:      Result Value   CO2 19 (*)    Glucose, Bld 234 (*)    Creatinine, Ser 1.25 (*)    Anion gap 17 (*)    All other components within normal limits  CBC - Abnormal; Notable for the following components:   RBC 3.54 (*)    Hemoglobin 10.6 (*)    HCT 32.0 (*)    All other components within normal limits  URINALYSIS, ROUTINE W REFLEX MICROSCOPIC - Abnormal; Notable for the following components:   Glucose, UA >=500 (*)    Hgb urine dipstick SMALL (*)    Ketones, ur 80 (*)    Protein, ur 100 (*)    Bacteria, UA RARE (*)    All other components within normal limits  RAPID URINE DRUG SCREEN, HOSP PERFORMED - Abnormal; Notable for the following components:   Cocaine POSITIVE (*)    All other components within normal limits  BASIC METABOLIC PANEL - Abnormal; Notable for the following components:   CO2 20 (*)    Glucose, Bld 235 (*)    Calcium 8.8 (*)    All other components within normal limits  BASIC METABOLIC PANEL - Abnormal; Notable for the following components:   CO2 20 (*)    Glucose, Bld 233 (*)    Calcium 8.5 (*)    All other  components within normal limits  BASIC METABOLIC PANEL - Abnormal; Notable for the following components:   Glucose, Bld 185 (*)    Calcium 8.6 (*)    All other components within normal limits  HEMOGLOBIN A1C - Abnormal; Notable for the following components:   Hgb A1c MFr Bld 10.6 (*)    All other components within normal limits  CBC - Abnormal; Notable for the following components:   WBC 2.9 (*)    RBC 3.52 (*)    Hemoglobin 10.8 (*)    HCT 31.8 (*)    All other components within normal limits  GLUCOSE, CAPILLARY - Abnormal; Notable for the following components:   Glucose-Capillary 136 (*)    All other components within normal limits  I-STAT VENOUS BLOOD GAS, ED - Abnormal; Notable for the following components:   pO2, Ven 58.0 (*)    Acid-base deficit 5.0 (*)    All other components within normal limits  CBG MONITORING, ED - Abnormal; Notable for the following components:   Glucose-Capillary 227 (*)    All other components within normal limits  CBG MONITORING, ED - Abnormal; Notable for the following components:   Glucose-Capillary 205 (*)    All other components within normal limits  CBG MONITORING, ED - Abnormal; Notable for the following components:   Glucose-Capillary 200 (*)    All other components within normal limits  MRSA PCR SCREENING  TROPONIN I  TROPONIN  I  HIV ANTIBODY (ROUTINE TESTING)  CREATININE, SERUM  TROPONIN I  BASIC METABOLIC PANEL  I-STAT TROPONIN, ED  I-STAT TROPONIN, ED    EKG EKG Interpretation  Date/Time:  Thursday October 27 2017 07:34:04 EDT Ventricular Rate:  100 PR Interval:    QRS Duration: 89 QT Interval:  302 QTC Calculation: 390 R Axis:   78 Text Interpretation:  Sinus tachycardia Borderline T wave abnormalities Baseline wander in lead(s) V2 ST elevation likely early repolarization No significant change since last tracing Confirmed by Alvira Monday ([id number]) on 10/27/2017 6:07:41 PM   Radiology Dg Chest 2 View  Result Date:  10/27/2017 CLINICAL DATA:  Chest pain radiating to the left arm, shortness of breath, and cough for 1 hour. History of COPD, hypertension, smoker. EXAM: CHEST - 2 VIEW COMPARISON:  08/20/2016 FINDINGS: The heart size and mediastinal contours are within normal limits. Both lungs are clear. The visualized skeletal structures are unremarkable. IMPRESSION: No active cardiopulmonary disease. Electronically Signed   By: Burman Nieves M.D.   On: 10/27/2017 02:53    Procedures Procedures (including critical care time)  Medications Ordered in ED Medications  0.9 %  sodium chloride infusion ( Intravenous New Bag/Given 10/27/17 0920)  hydrALAZINE (APRESOLINE) injection 5 mg (has no administration in time range)  ondansetron (ZOFRAN) injection 4 mg (has no administration in time range)  enoxaparin (LOVENOX) injection 40 mg (40 mg Subcutaneous Given 10/27/17 1714)  potassium chloride 10 mEq in 100 mL IVPB (0 mEq Intravenous Stopped 10/27/17 0916)  acetaminophen (TYLENOL) tablet 650 mg (has no administration in time range)  insulin glargine (LANTUS) injection 45 Units (45 Units Subcutaneous Given 10/27/17 1349)  insulin aspart (novoLOG) injection 0-15 Units (2 Units Subcutaneous Given 10/27/17 1640)  insulin aspart (novoLOG) injection 0-5 Units (has no administration in time range)  insulin aspart (novoLOG) injection 3 Units (3 Units Subcutaneous Given 10/27/17 1802)  gi cocktail (Maalox,Lidocaine,Donnatal) (has no administration in time range)  metoprolol tartrate (LOPRESSOR) tablet 25 mg (25 mg Oral Given 10/27/17 1042)  traMADol (ULTRAM) tablet 50 mg (has no administration in time range)  aspirin EC tablet 81 mg (81 mg Oral Given 10/27/17 1712)  azaTHIOprine (IMURAN) tablet 50 mg (50 mg Oral Given 10/27/17 1755)  baclofen (LIORESAL) tablet 10 mg (10 mg Oral Given 10/27/17 1758)  mometasone-formoterol (DULERA) 100-5 MCG/ACT inhaler 2 puff (has no administration in time range)  buPROPion (WELLBUTRIN XL) 24 hr tablet 300  mg (300 mg Oral Given 10/27/17 1756)  cholecalciferol (VITAMIN D) tablet 1,000 Units (1,000 Units Oral Given 10/27/17 1713)  vitamin B-12 (CYANOCOBALAMIN) tablet 1,000 mcg (1,000 mcg Oral Given 10/27/17 1713)  divalproex (DEPAKOTE ER) 24 hr tablet 250 mg (250 mg Oral Given 10/27/17 1756)  diazepam (VALIUM) tablet 5 mg (has no administration in time range)  ezetimibe (ZETIA) tablet 10 mg (10 mg Oral Given 10/27/17 1712)  fluticasone (FLONASE) 50 MCG/ACT nasal spray 2 spray (2 sprays Each Nare Given 10/27/17 1759)  gabapentin (NEURONTIN) capsule 300 mg (300 mg Oral Given 10/27/17 1712)  hydrOXYzine (VISTARIL) capsule 25 mg (has no administration in time range)  ibuprofen (ADVIL,MOTRIN) tablet 800 mg (has no administration in time range)  insulin aspart (novoLOG) injection 20 Units (has no administration in time range)  loratadine (CLARITIN) tablet 10 mg (10 mg Oral Given 10/27/17 1800)  losartan (COZAAR) tablet 25 mg (25 mg Oral Given 10/27/17 1713)  methimazole (TAPAZOLE) tablet 5 mg (5 mg Oral Given 10/27/17 1757)  methocarbamol (ROBAXIN) tablet 500 mg (has no administration in  time range)  mirtazapine (REMERON) tablet 15 mg (has no administration in time range)  multivitamin with minerals tablet 1 tablet (1 tablet Oral Given 10/27/17 1712)  oxybutynin (DITROPAN-XL) 24 hr tablet 10 mg (has no administration in time range)  pantoprazole (PROTONIX) EC tablet 40 mg (has no administration in time range)  sertraline (ZOLOFT) tablet 200 mg (200 mg Oral Given 10/27/17 1712)  albuterol (PROVENTIL) (2.5 MG/3ML) 0.083% nebulizer solution 2.5 mg (has no administration in time range)  sodium chloride 0.9 % bolus 1,000 mL (0 mLs Intravenous Stopped 10/27/17 0618)  sodium chloride 0.9 % bolus 2,000 mL (0 mLs Intravenous Stopped 10/27/17 0916)     Initial Impression / Assessment and Plan / ED Course  I have reviewed the triage vital signs and the nursing notes.  Pertinent labs & imaging results that were available during my care  of the patient were reviewed by me and considered in my medical decision making (see chart for details).     42yo male with history of htn, DM, hlpd, smoking, off of medications for last 4 days, presents with chest pressure beginning this evening. EKG shows likely early repolarization--similar to prior ECGs, has been diagnosed with possible pericarditis in the past 2010 and had stress test at that time which was normal.   No tachycardia, no tachypnea, no hypoxia, low suspicion for PE.  History most concerning for cardiac etiology given character and relief with nitro. CP free at this time.  Patient HEAR score of 5, will admit for continued chest pain evaluation.  Labs also significant for mild AG acidosis, UA pending. Pt given IV fluids.    Final Clinical Impressions(s) / ED Diagnoses   Final diagnoses:  Chest pain with high risk for cardiac etiology    ED Discharge Orders    None       Alvira Monday, MD 10/27/17 1818

## 2017-10-27 NOTE — ED Notes (Signed)
CBG result 205, Maudry MayhewMillie Gage, RN made aware

## 2017-10-27 NOTE — Care Management (Signed)
This is a no charge note  Pending admission per Dr. Dalene SeltzerSchlossman  42 year old male with past medical history of homeless, hypertension, hyperlipidemia, diabetes mellitus, COPD depression with anxiety bipolar disorder, OSA, anemia, CIDP, who presents with chest pain. His chest pain is located in the central chest, which has resolved after 1 dose of  NTG. Pt was found to have possible DKA with bicarbonate 19, anion gap 17, blood sugar 234. DKA protocol was started. Admission order was put in using DKA order set.  Wayne HarpXilin Willem Klingensmith, MD  [facility] Pager [phone]  If 7PM-7AM, please contact night-coverage [email or web address] Password [email or web address] 10/27/2017, 6:18 AM

## 2017-10-27 NOTE — Progress Notes (Signed)
[name] Telemetry box applied and CCMD notified x2. Vitals obtained. Pt oriented to room and staff. Dinner tray ordered. MRSA screening obtained. CHG bath completed. Pt denies pain. Admission RN notified of pt's arrival. Pt denies needs at this time. Will continue current plan of care.   Ardeen JourdainLauren Khalil Belote BSN, RN

## 2017-10-27 NOTE — ED Notes (Signed)
Checked patient cbg it was 200 notified RN of blood sugar patient is now getting his eko

## 2017-10-27 NOTE — ED Triage Notes (Signed)
Per EMS, pt is homeless coming in for sudden inset of chest pain starting around 2300. Pain starts in center of chest and radiates to his left arm. Pt has COPD and HTN an has not been able to to take his meds for weeks due to insufficient funds. EMS gave 0.4mg  of nitro which dropped pain to 0/10.

## 2017-10-27 NOTE — ED Notes (Signed)
Echo at bedsdier

## 2017-10-27 NOTE — ED Notes (Signed)
Pt ate all of meal tray.  

## 2017-10-27 NOTE — ED Notes (Signed)
Dr. Willette PaSheehan at bedside, to update orders.

## 2017-10-27 NOTE — H&P (Signed)
History and Physical    Wayne [name] ZOX:[id number] DOB: 1976-07-11 DOA: 10/27/2017  PCP: Dorthula Matas, PA-C  Patient coming from: Homeless via EMS  I have personally briefly reviewed patient's old medical records in [facility]  Chief Complaint: chest pain  HPI: Wayne [name] is a 42 y.o. male with medical history significant for homelessness, hypertension, hyperlipidemia, diabetes mellitus, COPD depression with anxiety bipolar disorder, OSA, anemia, CIDP, who presents with chest pain. His chest pain is located in the central chest, which has resolved after 1 dose of  NTG.  Pain does not radiate.  He describes the pain as a sharp pressure.  He currently states he is pain-free.  EKG was abnormal suggesting ST segment diffuse elevations consistent with pericarditis.  EKG in the emergency department was unremarkable.  She has been out of medications for a week.  Blood pressure is poorly controlled due to no medications.  He complains of some increased shortness of breath associated with his COPD and some wheezing which is better after treatment in the emergency department although he did not receive any nebulizers.  Pt was found to have  DKA with bicarbonate 19, anion gap 17, blood sugar 234.  Patient treated in the emergency department with IV fluids and insulin drip and DKA orders were placed by Dr. Clyde Lundborg.  Ultimately prior to starting the insulin drip patient's blood sugars were reevaluated and a BMP revealed that his anion gap is closed.  At this point he does not require full admission for DKA but rather observation for his chest pain.  He requests an evaluation by the social worker due to difficulty obtaining medications.  Review of Systems: As per HPI otherwise all other systems reviewed and  negative.    Past Medical History:  Diagnosis Date  . Anemia   . Asthma   . Bipolar 1 disorder (HCC)   . Chest pain   . Chronic headaches   . CIDP (chronic inflammatory demyelinating polyneuropathy)  (HCC)   . COPD (chronic obstructive pulmonary disease) (HCC)   . Depression   . Diabetes mellitus   . GERD (gastroesophageal reflux disease)   . HTN (hypertension)   . Hyperlipidemia   . Hypogonadism in male   . IBS (irritable bowel syndrome)   . Ingrown nail 11/12/2013  . Kidney stones   . Lumbar disc disease   . OSA (obstructive sleep apnea)   . Osteoarthritis   . Peripheral neuropathy 02/01/2011  . Polysubstance abuse (HCC)    cocaine, marijuana-quit summer 2010  . Tobacco abuse     Past Surgical History:  Procedure Laterality Date  . BUNIONECTOMY  06-2009  . KNEE SURGERY       reports that he has been smoking cigarettes.  He has a 3.40 pack-year smoking history. He has quit using smokeless tobacco. He reports that he has current or past drug history. Drug: Cocaine. He reports that he does not drink alcohol.  Allergies  Allergen Reactions  . Nystatin Other (See Comments)    Reaction:  Muscle weakness   . Rosuvastatin   . Statins Other (See Comments)    Reaction:  Muscle weakness and stomach cramps   . Iodine Rash  . Shellfish-Derived Products Rash    Family History  Problem Relation Age of Onset  . Heart disease Mother   . Asthma Mother   . Stroke Mother   . COPD Mother        maternal un  . Diabetes Father   .  Hyperlipidemia Father   . Hypertension Father   . Depression Father   . Emphysema Maternal Uncle   . Asthma Sister   . Alzheimer's disease Maternal Grandmother   . Breast cancer Maternal Aunt      Prior to Admission medications   Medication Sig Start Date End Date Taking? Authorizing Provider  albuterol (PROVENTIL HFA;VENTOLIN HFA) 108 (90 Base) MCG/ACT inhaler Inhale into the lungs every 6 (six) hours as needed for wheezing or shortness of breath.    [provider]  ARIPiprazole ER (ABILIFY MAINTENA) 400 MG PRSY Inject 400 mg into the muscle every 30 (thirty) days.    [provider]  aspirin EC 81 MG tablet Take 81 mg by mouth  daily.    [provider]  azaTHIOprine (IMURAN) 50 MG tablet Take 50 mg by mouth daily.    [provider]  baclofen (LIORESAL) 10 MG tablet Take 10 mg by mouth 3 (three) times daily.    [provider]  budesonide-formoterol (SYMBICORT) 80-4.5 MCG/ACT inhaler Inhale 2 puffs into the lungs 2 (two) times daily.    [provider]  buPROPion (WELLBUTRIN XL) 300 MG 24 hr tablet Take 300 mg by mouth daily.    [provider]  cephALEXin (KEFLEX) 500 MG capsule Take 1 capsule (500 mg total) by mouth 2 (two) times daily. 09/20/17   Gilda Crease, MD  cholecalciferol (VITAMIN D) 1000 units tablet Take 1,000 Units by mouth daily.    [provider]  clotrimazole (LOTRIMIN) 1 % cream Apply 1 application topically 2 (two) times daily.    [provider]  cyanocobalamin 1000 MCG tablet Take 1,000 mcg by mouth daily.    [provider]  diazepam (VALIUM) 5 MG tablet Take 5 mg by mouth every 6 (six) hours as needed for anxiety.    [provider]  divalproex (DEPAKOTE ER) 250 MG 24 hr tablet Take 250 mg by mouth daily.    [provider]  ezetimibe (ZETIA) 10 MG tablet Take 10 mg by mouth daily.    [provider]  fluticasone (FLONASE) 50 MCG/ACT nasal spray Place 2 sprays into both nostrils daily.     [provider]  gabapentin (NEURONTIN) 300 MG capsule Take 300 mg by mouth 3 (three) times daily.    [provider]  glucosamine-chondroitin 500-400 MG tablet Take 1 tablet by mouth 3 (three) times daily.    [provider]  hydrOXYzine (VISTARIL) 25 MG capsule Take 25 mg by mouth 3 (three) times daily as needed for anxiety.     [provider]  ibuprofen (ADVIL,MOTRIN) 800 MG tablet Take 800 mg by mouth every 6 (six) hours as needed for moderate pain.    [provider]  insulin aspart (NOVOLOG) 100 UNIT/ML injection Inject 20 Units into the skin 3 (three)  times daily before meals.     [provider]  Insulin Degludec 100 UNIT/ML SOLN Inject 45 Units into the skin every evening.     [provider]  loratadine (CLARITIN) 10 MG tablet Take 10 mg by mouth daily.    [provider]  losartan (COZAAR) 25 MG tablet Take 25 mg by mouth daily.    [provider]  methimazole (TAPAZOLE) 5 MG tablet Take 5 mg by mouth every other day.     [provider]  methocarbamol (ROBAXIN) 500 MG tablet Take 500 mg by mouth every 6 (six) hours as needed for muscle spasms.  [provider]  mirtazapine (REMERON) 15 MG tablet Take 15 mg by mouth at bedtime.    [provider]  Multiple Vitamin (MULTIVITAMIN WITH MINERALS) TABS tablet Take 1 tablet by mouth daily.    [provider]  ondansetron (ZOFRAN) 4 MG tablet Take 4 mg by mouth every 8 (eight) hours as needed for nausea or vomiting.    [provider]  oxybutynin (DITROPAN-XL) 10 MG 24 hr tablet Take 10 mg by mouth at bedtime.    [provider]  pantoprazole (PROTONIX) 40 MG tablet Take 40 mg by mouth daily.    [provider]  sertraline (ZOLOFT) 100 MG tablet Take 200 mg by mouth daily.     [provider]  sildenafil (VIAGRA) 50 MG tablet Take 50 mg by mouth daily as needed for erectile dysfunction.    [provider]  traMADol (ULTRAM) 50 MG tablet Take 50 mg by mouth every 8 (eight) hours as needed for moderate pain.     [provider]    Physical Exam: Vitals:   10/27/17 0600 10/27/17 0615 10/27/17 0700 10/27/17 0730  BP: (!) 140/95 125/90 (!) 129/92 135/89  Pulse: 95 92 94 99  Resp: 12 12 20 17   Temp:      TempSrc:      SpO2: 97% 97% 98% 98%  Weight:      Height:        Constitutional: calm, comfortable, tremors associated with chills, ill-appearing Vitals:   10/27/17 0600 10/27/17 0615 10/27/17 0700 10/27/17 0730  BP: (!) 140/95 125/90 (!) 129/92 135/89  Pulse: 95  92 94 99  Resp: 12 12 20 17   Temp:      TempSrc:      SpO2: 97% 97% 98% 98%  Weight:      Height:       Eyes: PERRL, lids and conjunctivae normal ENMT: Mucous membranes are moist. Posterior pharynx clear of any exudate or lesions.Normal dentition.  Fruity breath odor Neck: normal, supple, no masses, no thyromegaly Respiratory: clear to auscultation bilaterally, no wheezing, no crackles. Normal respiratory effort. No accessory muscle use.  Cardiovascular: Regular rate and rhythm, no murmurs / rubs / gallops. No extremity edema. 2+ pedal pulses. No carotid bruits.  Abdomen: no tenderness, no masses palpated. No hepatosplenomegaly. Bowel sounds positive.  Musculoskeletal: no clubbing / cyanosis. No joint deformity upper and lower extremities. Good ROM, no contractures. Normal muscle tone.  Skin: no rashes, lesions, ulcers. No induration Neurologic: CN 2-12 grossly intact. Sensation intact, DTR normal. Strength 5/5 in all 4.  Psychiatric: Normal judgment and insight. Alert and oriented x 3. Normal mood.    Labs on Admission: I have personally reviewed following labs and imaging studies  CBC: Recent Labs  Lab 10/27/17 0213  WBC 4.0  HGB 10.6*  HCT 32.0*  MCV 90.4  PLT 205   Basic Metabolic Panel: Recent Labs  Lab 10/27/17 0213 10/27/17 0611  NA 140 138  K 4.0 3.7  CL 104 104  CO2 19* 20*  GLUCOSE 234* 235*  BUN 17 15  CREATININE 1.25* 1.21  CALCIUM 9.1 8.8*  Initial anion gap was 17 now closed at 14 after IV fluids.  GFR: Estimated Creatinine Clearance: 87.6 mL/min (by C-G formula based on SCr of 1.21 mg/dL).   Cardiac Enzymes: Recent Labs  Lab 10/27/17 0611  TROPONINI <0.03   CBG: Recent Labs  Lab 10/27/17 0624  GLUCAP 227*   Urine analysis:    Component Value Date/Time  COLORURINE YELLOW 09/19/2017 1643   APPEARANCEUR CLEAR 09/19/2017 1643   LABSPEC 1.020 09/19/2017 1643   PHURINE 6.5 09/19/2017 1643   GLUCOSEU >=500 (A) 09/19/2017 1643   GLUCOSEU  >=1000 11/13/2009 1033   HGBUR NEGATIVE 09/19/2017 1643   BILIRUBINUR NEGATIVE 09/19/2017 1643   BILIRUBINUR negative 02/03/2015 1155   KETONESUR NEGATIVE 09/19/2017 1643   PROTEINUR NEGATIVE 09/19/2017 1643   UROBILINOGEN 0.2 02/15/2015 1911   NITRITE NEGATIVE 09/19/2017 1643   LEUKOCYTESUR NEGATIVE 09/19/2017 1643    Radiological Exams on Admission: Dg Chest 2 View  Result Date: 10/27/2017 CLINICAL DATA:  Chest pain radiating to the left arm, shortness of breath, and cough for 1 hour. History of COPD, hypertension, smoker. EXAM: CHEST - 2 VIEW COMPARISON:  08/20/2016 FINDINGS: The heart size and mediastinal contours are within normal limits. Both lungs are clear. The visualized skeletal structures are unremarkable. IMPRESSION: No active cardiopulmonary disease. Electronically Signed   By: Burman NievesWilliam  Stevens M.D.   On: 10/27/2017 02:53  Personally viewed by me  EKG: Independently reviewed.  Initial EKG shows diffuse ST segment elevations.  Repeat EKG shows improvement in ST segment elevations not consistent with pericarditis.  Assessment/Plan Principal Problem:   Chest pain Active Problems:   Type 2 diabetes mellitus with hyperglycemia (HCC)   Type 2 diabetes mellitus with hyperlipidemia (HCC)   Essential hypertension   COPD (chronic obstructive pulmonary disease) (HCC)   Cocaine abuse (HCC)   Depression, major, recurrent (HCC)   Obstructive sleep apnea   Tobacco abuse    1.  Chest pain: Patient will be admitted into the hospital will rule out myocardial infarction.  His heart score is 5 and is rather concerning.  We will start beta-blocker pending medication reconciliation.  Will continue home aspirin.  Will order nuclear medicine stress testing as patient has not previously had one and given his risk factors would benefit from further cardiac evaluation.  We will also check an echocardiogram.  2.  Diabetes mellitus type 2 with hyperglycemia: We will restart patient's insulin he  wishes to switch from nighttime dosing to a.m. dosing feels he would be more able to follow recommendations doing so.  We will start him on a carbohydrate modified diet sliding scale insulin nocturnal and mealtime insulin as well. We will also check a hemoglobin A1c.  3.  Hyperlipidemia: We will check a lipid profile and evaluate patient's need for statin medication.  4.  Essential hypertension: Patient came in blood pressures were elevated after nitroglycerin blood pressures improved.  Will restart home blood pressure medications and monitor closely will order as needed hydralazine for elevated blood pressures.  5.  COPD with mild exacerbation: Patient reports some shortness of breath however he is examination does not reveal any wheezing and he did not require any nebulizers in the emergency department.  We will resume his home inhalers and monitor.  6.  Cocaine abuse: We will check urine drug screen patient has been counseled regarding cessation of cocaine use.  7.  Major depression recurrent: In the past this is been felt secondary due to drug abuse.  Will resume home management.  8.  Obstructive sleep apnea: Patient diagnosed in 2011 and reportedly has a CPAP machine at home.  Will order CPAP here while in the hospital.  9.  Tobacco abuse.  Patient counseled to discontinue use of tobacco.  He states he has significantly cut back and smokes only about 5 or 6 cigarettes a day.  Patient encouraged to continue cutting back to  he can eventually quit.   DVT prophylaxis: Lovenox Code Status: Full code Family Communication: Patient presents without family present is alert and retains capacity. Disposition Plan: Likely discharge to previous home situation.  In a.m. Consults called: None Admission status: Observation   Lahoma Crocker MD FACP [facility] Pager [phone]  If 7PM-7AM, please contact night-coverage [email or web address] Password [email or web address]  10/27/2017, 8:14 AM

## 2017-10-27 NOTE — ED Notes (Addendum)
Pt ate 100% of diet.

## 2017-10-27 NOTE — Progress Notes (Signed)
  Echocardiogram 2D Echocardiogram has been performed.  Celene SkeenVijay  Kagan Hietpas 10/27/2017, 1:40 PM

## 2017-10-27 NOTE — Progress Notes (Signed)
PHARMACIST - PHYSICIAN ORDER COMMUNICATION  CONCERNING: P&T Medication Policy on Herbal Medications  DESCRIPTION:  This patient's order for:  Glucosamine-chondroitin  has been noted.  This product(s) is classified as an "herbal" or natural product. Due to a lack of definitive safety studies or FDA approval, nonstandard manufacturing practices, plus the potential risk of unknown drug-drug interactions while on inpatient medications, the Pharmacy and Therapeutics Committee does not permit the use of "herbal" or natural products of this type [facility].   ACTION TAKEN: The pharmacy department is unable to verify this order at this time and your patient has been informed of this safety policy. Please reevaluate patient's clinical condition at discharge and address if the herbal or natural product(s) should be resumed at that time.  Girard CooterKimberly Perkins, PharmD Clinical Pharmacist  Pager: [phone] Phone: [phone]

## 2017-10-27 NOTE — ED Notes (Signed)
Patient transported to X-ray 

## 2017-10-28 ENCOUNTER — Observation Stay (HOSPITAL_COMMUNITY): Payer: BLUE CROSS/BLUE SHIELD

## 2017-10-28 ENCOUNTER — Encounter (HOSPITAL_COMMUNITY): Payer: Self-pay | Admitting: Cardiology

## 2017-10-28 DIAGNOSIS — F1410 Cocaine abuse, uncomplicated: Secondary | ICD-10-CM

## 2017-10-28 DIAGNOSIS — G4733 Obstructive sleep apnea (adult) (pediatric): Secondary | ICD-10-CM | POA: Diagnosis present

## 2017-10-28 DIAGNOSIS — R072 Precordial pain: Secondary | ICD-10-CM

## 2017-10-28 DIAGNOSIS — Z96652 Presence of left artificial knee joint: Secondary | ICD-10-CM | POA: Diagnosis present

## 2017-10-28 DIAGNOSIS — E1122 Type 2 diabetes mellitus with diabetic chronic kidney disease: Secondary | ICD-10-CM | POA: Diagnosis present

## 2017-10-28 DIAGNOSIS — Z87442 Personal history of urinary calculi: Secondary | ICD-10-CM | POA: Diagnosis not present

## 2017-10-28 DIAGNOSIS — J449 Chronic obstructive pulmonary disease, unspecified: Secondary | ICD-10-CM

## 2017-10-28 DIAGNOSIS — G6181 Chronic inflammatory demyelinating polyneuritis: Secondary | ICD-10-CM

## 2017-10-28 DIAGNOSIS — R079 Chest pain, unspecified: Secondary | ICD-10-CM | POA: Diagnosis present

## 2017-10-28 DIAGNOSIS — J441 Chronic obstructive pulmonary disease with (acute) exacerbation: Secondary | ICD-10-CM | POA: Diagnosis present

## 2017-10-28 DIAGNOSIS — F17219 Nicotine dependence, cigarettes, with unspecified nicotine-induced disorders: Secondary | ICD-10-CM

## 2017-10-28 DIAGNOSIS — Z79899 Other long term (current) drug therapy: Secondary | ICD-10-CM | POA: Diagnosis not present

## 2017-10-28 DIAGNOSIS — I10 Essential (primary) hypertension: Secondary | ICD-10-CM | POA: Diagnosis present

## 2017-10-28 DIAGNOSIS — I952 Hypotension due to drugs: Secondary | ICD-10-CM | POA: Diagnosis present

## 2017-10-28 DIAGNOSIS — E440 Moderate protein-calorie malnutrition: Secondary | ICD-10-CM | POA: Diagnosis present

## 2017-10-28 DIAGNOSIS — N184 Chronic kidney disease, stage 4 (severe): Secondary | ICD-10-CM | POA: Diagnosis present

## 2017-10-28 DIAGNOSIS — Z7951 Long term (current) use of inhaled steroids: Secondary | ICD-10-CM | POA: Diagnosis not present

## 2017-10-28 DIAGNOSIS — E1110 Type 2 diabetes mellitus with ketoacidosis without coma: Secondary | ICD-10-CM

## 2017-10-28 DIAGNOSIS — K219 Gastro-esophageal reflux disease without esophagitis: Secondary | ICD-10-CM | POA: Diagnosis present

## 2017-10-28 DIAGNOSIS — K589 Irritable bowel syndrome without diarrhea: Secondary | ICD-10-CM | POA: Diagnosis present

## 2017-10-28 DIAGNOSIS — Z7982 Long term (current) use of aspirin: Secondary | ICD-10-CM | POA: Diagnosis not present

## 2017-10-28 DIAGNOSIS — F339 Major depressive disorder, recurrent, unspecified: Secondary | ICD-10-CM | POA: Diagnosis present

## 2017-10-28 DIAGNOSIS — Z9119 Patient's noncompliance with other medical treatment and regimen: Secondary | ICD-10-CM | POA: Diagnosis not present

## 2017-10-28 DIAGNOSIS — Z720 Tobacco use: Secondary | ICD-10-CM

## 2017-10-28 DIAGNOSIS — E0590 Thyrotoxicosis, unspecified without thyrotoxic crisis or storm: Secondary | ICD-10-CM | POA: Diagnosis present

## 2017-10-28 DIAGNOSIS — Z888 Allergy status to other drugs, medicaments and biological substances status: Secondary | ICD-10-CM | POA: Diagnosis not present

## 2017-10-28 DIAGNOSIS — F17210 Nicotine dependence, cigarettes, uncomplicated: Secondary | ICD-10-CM | POA: Diagnosis present

## 2017-10-28 DIAGNOSIS — E1169 Type 2 diabetes mellitus with other specified complication: Secondary | ICD-10-CM | POA: Diagnosis present

## 2017-10-28 DIAGNOSIS — E785 Hyperlipidemia, unspecified: Secondary | ICD-10-CM | POA: Diagnosis present

## 2017-10-28 LAB — BASIC METABOLIC PANEL
Anion gap: 8 (ref 5–15)
BUN: 14 mg/dL (ref 6–20)
CO2: 26 mmol/L (ref 22–32)
Calcium: 8.6 mg/dL — ABNORMAL LOW (ref 8.9–10.3)
Chloride: 104 mmol/L (ref 101–111)
Creatinine, Ser: 1.02 mg/dL (ref 0.61–1.24)
GFR calc Af Amer: >60 mL/min (ref >60)
Glucose, Bld: 276 mg/dL — ABNORMAL HIGH (ref 65–99)
POTASSIUM: 3.2 mmol/L — AB (ref 3.5–5.1)
SODIUM: 138 mmol/L (ref 135–145)

## 2017-10-28 LAB — GLUCOSE, CAPILLARY
GLUCOSE-CAPILLARY: 263 mg/dL — AB (ref 65–99)
GLUCOSE-CAPILLARY: 214 mg/dL — AB (ref 65–99)
Glucose-Capillary: 243 mg/dL — ABNORMAL HIGH (ref 65–99)
Glucose-Capillary: 205 mg/dL — ABNORMAL HIGH (ref 65–99)

## 2017-10-28 LAB — NM MYOCAR MULTI W/SPECT W/WALL MOTION / EF
Peak HR: 96 {beats}/min
Rest HR: 86 {beats}/min

## 2017-10-28 MED ORDER — SODIUM CHLORIDE 0.9 % IV BOLUS
1000.0000 mL | Freq: Once | INTRAVENOUS | Status: AC
  Administered 2017-10-28: 1000 mL via INTRAVENOUS

## 2017-10-28 MED ORDER — TECHNETIUM TC 99M TETROFOSMIN IV KIT
10.0000 | PACK | Freq: Once | INTRAVENOUS | Status: AC | PRN
  Administered 2017-10-28: 10 via INTRAVENOUS

## 2017-10-28 MED ORDER — PREMIER PROTEIN SHAKE
2.0000 [oz_av] | Freq: Two times a day (BID) | ORAL | Status: DC
  Administered 2017-10-28 – 2017-10-29 (×2): 2 [oz_av] via ORAL
  Filled 2017-10-28 (×6): qty 325.31

## 2017-10-28 MED ORDER — REGADENOSON 0.4 MG/5ML IV SOLN
INTRAVENOUS | Status: AC
  Filled 2017-10-28: qty 5

## 2017-10-28 MED ORDER — POTASSIUM CHLORIDE CRYS ER 20 MEQ PO TBCR
40.0000 meq | EXTENDED_RELEASE_TABLET | Freq: Once | ORAL | Status: AC
  Administered 2017-10-28: 40 meq via ORAL
  Filled 2017-10-28: qty 2

## 2017-10-28 MED ORDER — TECHNETIUM TC 99M TETROFOSMIN IV KIT
30.0000 | PACK | Freq: Once | INTRAVENOUS | Status: AC | PRN
  Administered 2017-10-28: 30 via INTRAVENOUS

## 2017-10-28 MED ORDER — REGADENOSON 0.4 MG/5ML IV SOLN
0.4000 mg | Freq: Once | INTRAVENOUS | Status: AC
  Administered 2017-10-28: 0.4 mg via INTRAVENOUS
  Filled 2017-10-28: qty 5

## 2017-10-28 NOTE — Progress Notes (Signed)
PROGRESS NOTE    Wayne [name]   UPJ:[id number]  DOB: 15-Feb-1976  DOA: 10/27/2017 PCP: Jamesetta Orleans, PA-C   Brief Narrative:  Wayne [name] is a 42 y.o. male with medical history significant for homelessness, hypertension, hyperlipidemia, diabetes mellitus, COPD depression with anxiety bipolar disorder, OSA, anemia, CIDP, who presents with chest pain. His chest pain is located in the central chest, which has resolved after 1 dose of NTG.  Subjective: Not having any chest pain and has no other complaints. States he is not homeless and has insurance. He did not get his medications as he had not met his 1k deductible. He states now he has met his deductible and has his prescriptions already so should not have trouble getting his meds.  Admits to snorting cocaine.  ROS: no complaints of nausea, vomiting, constipation diarrhea, cough, dyspnea or dysuria. No other complaints.   Assessment & Plan:   Principal Problem:   Chest pain - has multiple risk factors and also uses Cocaine - troponin neg, EGK unrevealing - Myoview is intermediate risk with a fixed defect and EF of 35% - ECHO noted below - have asked for a cardiology opinion  Active Problems:   Type 2 diabetes mellitus with mild DKA - resolved in ED with treatment - A1c is 10.6 - notoriously non compliant based on past records - cont current doses of insulin    Cocaine abuse - habitual - snorts it & UDS is + for it - has been to [facility] for it - advised of risks including stroke, MI, infections, lung complications and advised to discontinue - HIV negative    Depression, major, recurrent  - has been inpatient in behavioral medicine-     Obstructive sleep apnea - CPAP QHS    Hypotension with h/o Essential hypertension - called by RN as BP dropped to 80s by later aftenoon and he felt dizzy laying in the bed - Medications were resumed by admitted bases on outpt med list - I held meds that could lead to hypotension  including Baclofen, Metoprolol, Neurontin   - ordered 1 L NS bolus - follow     COPD (chronic obstructive pulmonary disease)    Tobacco abuse - no wheezing  Hyperthyroid - Tapazol  Chronic inflammatory demyelinating polyneuropathy - Imuran   DVT prophylaxis: Lovenox Code Status: Full code Family Communication:  Disposition Plan: follow on tele  Consultants:   cardiology Procedures:   2 D ECHO Study Conclusions  - Left ventricle: The cavity size was normal. Wall thickness was   increased in a pattern of mild LVH. Systolic function was normal.   The estimated ejection fraction was in the range of 60% to 65%.   Wall motion was normal; there were no regional wall motion   abnormalities. Doppler parameters are consistent with abnormal   left ventricular relaxation (grade 1 diastolic dysfunction). - Aortic valve: There was no stenosis. - Mitral valve: There was trivial regurgitation. - Right ventricle: The cavity size was normal. Systolic function   was normal. - Pulmonary arteries: No complete TR doppler jet so unable to   estimate PA systolic pressure. - Inferior vena cava: The vessel was normal in size. The   respirophasic diameter changes were in the normal range (>= 50%),   consistent with normal central venous pressure.  Impressions:  - Normal LV size with mild LV hypertrophy. EF 60-65%. Normal RV   size and systolic function. No significant valvular   abnormalities. Antimicrobials:  Anti-infectives (From admission, onward)   None       Objective: Vitals:   10/28/17 1102 10/28/17 1400 10/28/17 1419 10/28/17 1510  BP: 116/80 (!) 85/55 (!) 81/66 104/74  Pulse: 91 81  85  Resp: _0 Temp: 97.6 F (36.4 C) 97.7 F (36.5 C)    TempSrc:  Oral    SpO2:  97%    Weight:      Height:        Intake/Output Summary (Last 24 hours) at 10/28/2017 1650 Last data filed at 10/28/2017 1514 Gross per 24 hour  Intake 2769.17 ml  Output 400 ml  Net 2369.17 ml    Filed Weights   10/27/17 0215 10/27/17 1611  Weight: 77.1 kg (170 lb) 73.2 kg (161 lb 4.8 oz)    Examination: General exam: Appears comfortable  HEENT: PERRLA, oral mucosa moist, no sclera icterus or thrush Respiratory system: Clear to auscultation. Respiratory effort normal. Cardiovascular system: S1 & S2 heard, RRR.  No murmurs  Gastrointestinal system: Abdomen soft, non-tender, nondistended. Normal bowel sound. No organomegaly Central nervous system: Alert and oriented. No focal neurological deficits. Extremities: No cyanosis, clubbing or edema Skin: No rashes or ulcers Psychiatry:  Mood & affect appropriate.     Data Reviewed: I have personally reviewed following labs and imaging studies  CBC: Recent Labs  Lab 10/27/17 0213 10/27/17 0948  WBC 4.0 2.9*  HGB 10.6* 10.8*  HCT 32.0* 31.8*  MCV 90.4 90.3  PLT 205 244   Basic Metabolic Panel: Recent Labs  Lab 10/27/17 0213 10/27/17 0611 10/27/17 0948 10/27/17 1511 10/28/17 0251  NA 140 138 138 140 138  K 4.0 3.7 3.9 3.5 3.2*  CL 104 104 104 106 104  CO2 19* 20* 20* 24 26  GLUCOSE 234* 235* 233* 185* 276*  BUN _1 CREATININE 1.25* 1.21 1.17  1.24 1.14 1.02  CALCIUM 9.1 8.8* 8.5* 8.6* 8.6*   GFR: Estimated Creatinine Clearance: 98.7 mL/min (by C-G formula based on SCr of 1.02 mg/dL). Liver Function Tests: No results for input(s): AST, ALT, ALKPHOS, BILITOT, PROT, ALBUMIN in the last 168 hours. No results for input(s): LIPASE, AMYLASE in the last 168 hours. No results for input(s): AMMONIA in the last 168 hours. Coagulation Profile: No results for input(s): INR, PROTIME in the last 168 hours. Cardiac Enzymes: Recent Labs  Lab 10/27/17 0611 10/27/17 1258 10/27/17 1833  TROPONINI <0.03 <0.03 <0.03   BNP (last 3 results) No results for input(s): PROBNP in the last 8760 hours. HbA1C: Recent Labs    10/27/17 0948  HGBA1C 10.6*   CBG: Recent Labs  Lab 10/27/17 1635 10/27/17 2123  10/28/17 0616 10/28/17 1107 10/28/17 1612  GLUCAP 136* 192* 263* 214* 243*   Lipid Profile: No results for input(s): CHOL, HDL, LDLCALC, TRIG, CHOLHDL, LDLDIRECT in the last 72 hours. Thyroid Function Tests: No results for input(s): TSH, T4TOTAL, FREET4, T3FREE, THYROIDAB in the last 72 hours. Anemia Panel: No results for input(s): VITAMINB12, FOLATE, FERRITIN, TIBC, IRON, RETICCTPCT in the last 72 hours. Urine analysis:    Component Value Date/Time   COLORURINE YELLOW 10/27/2017 [place] 10/27/2017 0948   LABSPEC 1.028 10/27/2017 0948   PHURINE 6.0 10/27/2017 0948   GLUCOSEU >=500 (A) 10/27/2017 0948   GLUCOSEU >=1000 11/13/2009 1033   HGBUR SMALL (A) 10/27/2017 0948   BILIRUBINUR NEGATIVE 10/27/2017 0948   BILIRUBINUR negative 02/03/2015 1155   KETONESUR 80 (A) 10/27/2017 0948   PROTEINUR  100 (A) 10/27/2017 0948   UROBILINOGEN 0.2 02/15/2015 1911   NITRITE NEGATIVE 10/27/2017 0948   LEUKOCYTESUR NEGATIVE 10/27/2017 0948   Sepsis Labs: _0 (procalcitonin:4,lacticidven:4) ) Recent Results (from the past 240 hour(s))  MRSA PCR Screening     Status: None   Collection Time: 10/27/17  4:21 PM  Result Value Ref Range Status   MRSA by PCR NEGATIVE NEGATIVE Final    Comment:        The GeneXpert MRSA Assay (FDA approved for NASAL specimens only), is one component of a comprehensive MRSA colonization surveillance program. It is not intended to diagnose MRSA infection nor to guide or monitor treatment for MRSA infections. Performed at [facility], [place] [address], [place], [place] [address]          Radiology Studies: Dg Chest 2 View  Result Date: 10/27/2017 CLINICAL DATA:  Chest pain radiating to the left arm, shortness of breath, and cough for 1 hour. History of COPD, hypertension, smoker. EXAM: CHEST - 2 VIEW COMPARISON:  08/20/2016 FINDINGS: The heart size and mediastinal contours are within normal limits. Both lungs are clear. The  visualized skeletal structures are unremarkable. IMPRESSION: No active cardiopulmonary disease. Electronically Signed   By: Lucienne Capers M.D.   On: 10/27/2017 02:53   Nm Myocar Multi W/spect W/wall Motion / Ef  Result Date: 10/28/2017  Small region of ischemia in the inferior wall (base, mid), inferolateral wall (base) Otherwise normal perfusion.  Nuclear stress EF: 42%.  This is an intermediate risk study.       Scheduled Meds: . albuterol  2.5 mg Nebulization BID  . aspirin EC  81 mg Oral Daily  . azaTHIOprine  50 mg Oral Daily  . buPROPion  300 mg Oral Daily  . cholecalciferol  1,000 Units Oral Daily  . divalproex  250 mg Oral Daily  . enoxaparin (LOVENOX) injection  40 mg Subcutaneous Daily  . ezetimibe  10 mg Oral Daily  . fluticasone  2 spray Each Nare Daily  . insulin aspart  0-15 Units Subcutaneous TID WC  . insulin aspart  0-5 Units Subcutaneous QHS  . insulin aspart  3 Units Subcutaneous TID WC  . insulin glargine  45 Units Subcutaneous Daily  . loratadine  10 mg Oral Daily  . methimazole  5 mg Oral QODAY  . mometasone-formoterol  2 puff Inhalation BID  . multivitamin with minerals  1 tablet Oral Daily  . oxybutynin  10 mg Oral QHS  . pantoprazole  40 mg Oral Daily  . protein supplement shake  2 oz Oral BID BM  . regadenoson      . cyanocobalamin  1,000 mcg Oral Daily   Continuous Infusions: . sodium chloride 75 mL/hr at 10/28/17 0624     LOS: 1 day         Debbe Odea, MD [facility] Pager: [email or web address] Password [email or web address] 10/28/2017, 4:50 PM

## 2017-10-28 NOTE — Progress Notes (Signed)
   Wayne [name] presented for a nuclear stress test today.  No immediate complications.  Stress imaging is pending at this time.  Preliminary EKG findings may be listed in the chart, but the stress test result will not be finalized until perfusion imaging is complete.   Beatriz StallionKrista M. Adamae Ricklefs, PA-C 10/28/2017, 10:18 AM

## 2017-10-28 NOTE — Consult Note (Signed)
Cardiology Consultation:   Patient ID: Wayne [name]; [id number]; 05/11/1976   Admit date: 10/27/2017 Date of Consult: 10/28/2017  Primary Care Provider: Dorthula Matas, PA-C Primary Cardiologist: Dr Shirlee Latch    Patient Profile:   Wayne [name] is a 42 y.o. male with a hx of diabetes mellitus, hypertension, hyperlipidemia, COPD, bipolar disorder, obstructive sleep apnea, substance abuse who is being seen today for the evaluation of chest pain at the request of Calvert Cantor MD.  History of Present Illness:   Patient previously seen by Dr. Shirlee Latch for chest pain felt possibly secondary to pericarditis.  Patient states he did cocaine 2 days ago.  He then developed chest pain that lasted for 24 hours.  The pain radiated to his right arm.  He denies associated dyspnea or diaphoresis but did describe nausea.  The pain increased with inspiration and lying flat.  Resolved spontaneously.  He was admitted and ruled out.  Stress nuclear study today showed mild ischemia and cardiology asked to evaluate.  Patient has had no further chest pain.  He has some dyspnea on exertion but there is no orthopnea, PND, pedal edema or syncope.  Past Medical History:  Diagnosis Date  . Anemia   . Anxiety   . Arthritis    "knees and hands" (10/27/2017)  . Asthma   . Bipolar 1 disorder (HCC)   . Chest pain   . Chronic bronchitis (HCC)   . Chronic lower back pain   . CIDP (chronic inflammatory demyelinating polyneuropathy) (HCC)   . CKD (chronic kidney disease), stage IV (HCC)   . COPD (chronic obstructive pulmonary disease) (HCC)   . Daily headache   . Depression   . GERD (gastroesophageal reflux disease)   . Heart murmur   . History of kidney stones   . HTN (hypertension)   . Hyperlipidemia   . Hyperthyroidism   . Hypogonadism in male   . IBS (irritable bowel syndrome)   . Ingrown nail 11/12/2013  . Lumbar disc disease   . Migraine    "monthly" (10/27/2017)  . OSA (obstructive sleep apnea)    "used to wear mask;  just quit" (10/27/2017)  . Osteoarthritis   . Peripheral neuropathy 02/01/2011  . Polysubstance abuse (HCC)    cocaine, marijuana-quit summer 2010  . Tobacco abuse   . Type II diabetes mellitus (HCC)     Past Surgical History:  Procedure Laterality Date  . BUNIONECTOMY WITH HAMMERTOE RECONSTRUCTION Bilateral 06/2009  . CARPAL TUNNEL RELEASE Right   . KNEE ARTHROSCOPY Left 1995  . LITHOTRIPSY       Inpatient Medications: Scheduled Meds: . albuterol  2.5 mg Nebulization BID  . aspirin EC  81 mg Oral Daily  . azaTHIOprine  50 mg Oral Daily  . buPROPion  300 mg Oral Daily  . cholecalciferol  1,000 Units Oral Daily  . divalproex  250 mg Oral Daily  . enoxaparin (LOVENOX) injection  40 mg Subcutaneous Daily  . ezetimibe  10 mg Oral Daily  . fluticasone  2 spray Each Nare Daily  . insulin aspart  0-15 Units Subcutaneous TID WC  . insulin aspart  0-5 Units Subcutaneous QHS  . insulin aspart  3 Units Subcutaneous TID WC  . insulin glargine  45 Units Subcutaneous Daily  . loratadine  10 mg Oral Daily  . methimazole  5 mg Oral QODAY  . mometasone-formoterol  2 puff Inhalation BID  . multivitamin with minerals  1 tablet Oral Daily  . oxybutynin  10  mg Oral QHS  . pantoprazole  40 mg Oral Daily  . protein supplement shake  2 oz Oral BID BM  . cyanocobalamin  1,000 mcg Oral Daily   Continuous Infusions: . sodium chloride 75 mL/hr at 10/28/17 1657   PRN Meds: acetaminophen, gi cocktail, hydrALAZINE, ibuprofen, methocarbamol, ondansetron (ZOFRAN) IV, traMADol  Allergies:    Allergies  Allergen Reactions  . Nystatin Other (See Comments)    Reaction:  Muscle weakness   . Rosuvastatin   . Statins Other (See Comments)    Reaction:  Muscle weakness and stomach cramps   . Iodine Rash  . Shellfish-Derived Products Rash    Social History:   Social History   Socioeconomic History  . Marital status: Single    Spouse name: Not on file  . Number of children: 0  . Years of education:  Not on file  . Highest education level: Not on file  Occupational History  . Occupation: Corporate investment banker  Social Needs  . Financial resource strain: Not on file  . Food insecurity:    Worry: Not on file    Inability: Not on file  . Transportation needs:    Medical: Not on file    Non-medical: Not on file  Tobacco Use  . Smoking status: Current Every Day Smoker    Packs/day: 0.25    Years: 24.00    Pack years: 6.00    Types: Cigarettes  . Smokeless tobacco: Never Used  Substance and Sexual Activity  . Alcohol use: Yes    Comment: 10/27/2017 "might have a drink on major holidays"  . Drug use: Yes    Types: Cocaine    Comment: 10/27/2017 "nothing since 09/2016"  . Sexual activity: Not Currently  Lifestyle  . Physical activity:    Days per week: Not on file    Minutes per session: Not on file  . Stress: Not on file  Relationships  . Social connections:    Talks on phone: Not on file    Gets together: Not on file    Attends religious service: Not on file    Active member of club or organization: Not on file    Attends meetings of clubs or organizations: Not on file    Relationship status: Not on file  . Intimate partner violence:    Fear of current or ex partner: Not on file    Emotionally abused: Not on file    Physically abused: Not on file    Forced sexual activity: Not on file  Other Topics Concern  . Not on file  Social History Narrative   Patient reports homelessness requests social work evaluation    Family History:    Family History  Problem Relation Age of Onset  . Heart disease Mother   . Asthma Mother   . Stroke Mother   . COPD Mother        maternal un  . Diabetes Father   . Hyperlipidemia Father   . Hypertension Father   . Depression Father   . Emphysema Maternal Uncle   . Asthma Sister   . Alzheimer's disease Maternal Grandmother   . Breast cancer Maternal Aunt      ROS:  Please see the history of present illness.  Patient has occasional  headache.  No fevers, chills, productive cough or hemoptysis.  No dysphagia, melena or hematochezia. All other ROS reviewed and negative.     Physical Exam/Data:   Vitals:   10/28/17 1102 10/28/17 1400  10/28/17 1419 10/28/17 1510  BP: 116/80 (!) 85/55 (!) 81/66 104/74  Pulse: 91 81  85  Resp: 16 12  18   Temp: 97.6 F (36.4 C) 97.7 F (36.5 C)    TempSrc:  Oral    SpO2:  97%    Weight:      Height:        Intake/Output Summary (Last 24 hours) at 10/28/2017 1733 Last data filed at 10/28/2017 1514 Gross per 24 hour  Intake 1954.17 ml  Output 400 ml  Net 1554.17 ml   Filed Weights   10/27/17 0215 10/27/17 1611  Weight: 170 lb (77.1 kg) 161 lb 4.8 oz (73.2 kg)   Body mass index is 21.28 kg/m.  General:  Thin, well developed, in no acute distress HEENT: normal Neck: no JVD Vascular: No carotid bruits; FA pulses 2+ bilaterally without bruits  Cardiac:  normal S1, S2; RRR; no murmur  Lungs:  clear to auscultation bilaterally, no wheezing, rhonchi or rales  Abd: soft, nontender, no hepatomegaly  Ext: no edema Musculoskeletal:  No deformities, BUE and BLE strength normal and equal Skin: warm and dry; excoriations over lower extremities. Neuro:  CNs 2-12 intact, no focal abnormalities noted Psych:  Normal affect   EKG:  The EKG was personally reviewed and demonstrates: Normal sinus rhythm with diffuse ST elevation unchanged compared to May 2017. Telemetry:  Telemetry was personally reviewed and demonstrates: Sinus with occasional PVC.   Laboratory Data:  Chemistry Recent Labs  Lab 10/27/17 0948 10/27/17 1511 10/28/17 0251  NA 138 140 138  K 3.9 3.5 3.2*  CL 104 106 104  CO2 20* 24 26  GLUCOSE 233* 185* 276*  BUN 14 16 14   CREATININE 1.17  1.24 1.14 1.02  CALCIUM 8.5* 8.6* 8.6*  GFRNONAA >60  >60 >60 >60  GFRAA >60  >60 >60 >60  ANIONGAP 14 10 8      Hematology Recent Labs  Lab 10/27/17 0213 10/27/17 0948  WBC 4.0 2.9*  RBC 3.54* 3.52*  HGB 10.6* 10.8*    HCT 32.0* 31.8*  MCV 90.4 90.3  MCH 29.9 30.7  MCHC 33.1 34.0  RDW 12.3 12.7  PLT 205 181   Cardiac Enzymes Recent Labs  Lab 10/27/17 0611 10/27/17 1258 10/27/17 1833  TROPONINI <0.03 <0.03 <0.03    Recent Labs  Lab 10/27/17 0218 10/27/17 0627  TROPIPOC 0.00 0.00     Radiology/Studies:  Dg Chest 2 View  Result Date: 10/27/2017 CLINICAL DATA:  Chest pain radiating to the left arm, shortness of breath, and cough for 1 hour. History of COPD, hypertension, smoker. EXAM: CHEST - 2 VIEW COMPARISON:  08/20/2016 FINDINGS: The heart size and mediastinal contours are within normal limits. Both lungs are clear. The visualized skeletal structures are unremarkable. IMPRESSION: No active cardiopulmonary disease. Electronically Signed   By: Burman Nieves M.D.   On: 10/27/2017 02:53   Nm Myocar Multi W/spect W/wall Motion / Ef  Result Date: 10/28/2017  Small region of ischemia in the inferior wall (base, mid), inferolateral wall (base) Otherwise normal perfusion.  Nuclear stress EF: 42%.  This is an intermediate risk study.     Assessment and Plan:   1. Chest pain-symptoms are extremely atypical.  Duration of 24 hours with negative enzymes.  There is some reproduction with palpation.  Electrocardiogram shows diffuse ST elevation unchanged compared to May 2017.  Nuclear study was performed earlier today and showed ejection fraction 42% with diaphragmatic attenuation and possible mild inferior ischemia; study is not  high risk.  Echocardiogram shows normal LV function.  I would not pursue further evaluation at this time.  Patient has been noncompliant with medications in the past and abuses cocaine, most recently 2 days prior to admission.  Therefore if PCI ever required it is unclear if he would  be compliant with dual antiplatelet therapy.  Would continue aspirin. 2. Cocaine abuse-patient counseled on avoiding. 3. Tobacco abuse-patient counseled on discontinuing. Other issues per primary  care.  Patient can follow-up with 1 of our assistants in 4-6 weeks.  We will sign off.  Please call with questions.  For questions or updates, please contact [facility] Please consult [email or web address] for contact info under Cardiology/STEMI.   Signed, Olga MillersBrian Crenshaw, MD  10/28/2017 5:33 PM

## 2017-10-28 NOTE — Progress Notes (Addendum)
Initial Nutrition Assessment  DOCUMENTATION CODES:   Non-severe (moderate) malnutrition in context of social or environmental circumstances  INTERVENTION:  Premier protein BID each supplement providing 30 grams of protein and 160 kcal   NUTRITION DIAGNOSIS:   Moderate Malnutrition related to social / environmental circumstances(homelessness) as evidenced by moderate fat depletion, energy intake < 75% for > or equal to 3 months, moderate muscle depletion.  GOAL:   Patient will meet greater than or equal to 90% of their needs  MONITOR:   PO intake, Supplement acceptance, Labs, Weight trends, Skin, I & O's  REASON FOR ASSESSMENT:   Malnutrition Screening Tool    ASSESSMENT:   42 y.o. M admitted for DKA. PMH of hyperlipidemia, hypertension, substance abuse (cocaine, marijuana), tobacco use, asthma, lumbar disc disease, bipolar disorder/depression/anxiety, anemia, COPD, IBS, Hx of kidney stones, T2DM, chronic bronchitis, OSA, hyperthyroidism, chronic migraines, osteoarthritis, arthritis, chronic lower back pain, peripheral neuropathy, and CIDP.   Pt is homeless and has been unable to take medications due to money insecurity.   Pt reports not being able to eat for about 2 months due to food availability and decreased appetite. Pt reports he probably lost about 10 lbs within the last 2 months; 6% weight loss not significant for timeframe. Since he has been here pt reports that his appetite is back up and RN said that he ordered food already for lunch as soon as his NPO was lifted. Pt showed muscle weakness during the NFPE; showing great difficulty to press against my hand or raise his leg more than a couple of inches.    Pt [facility] in may of 2017 [facility]. Identified with malnutrition in context of social or environmental circumstances which is still ongoing. Although pt reported food insecurity for 2 months, suspect longer timeframe due to continued homelessness.   Medications  reviewed: vitamin D, wellbutrin, neurontin, novolog 0-5 units & 3 units, lantus 45 units, tapazole, lopressor, remeron, MVI with minerals, protonix, potassium chloride SA, vitamin B-12, 0.9% sodium chloride IV 75 ml/hr.   Labs reviewed: K+ 3.2 (L), BG 276 (H).    Intake/Output Summary (Last 24 hours) at 10/28/2017 1413 Last data filed at 10/28/2017 0800 Gross per 24 hour  Intake 1865 ml  Output 400 ml  Net 1465 ml    Lab Results  Component Value Date   HGBA1C 10.6 (H) 10/27/2017    NUTRITION - FOCUSED PHYSICAL EXAM:    Most Recent Value  Orbital Region  Mild depletion  Upper Arm Region  Moderate depletion  Thoracic and Lumbar Region  Mild depletion  Buccal Region  No depletion  Temple Region  Mild depletion  Clavicle Bone Region  Mild depletion  Clavicle and Acromion Bone Region  Mild depletion  Scapular Bone Region  Unable to assess  Dorsal Hand  Severe depletion  Patellar Region  Moderate depletion  Anterior Thigh Region  Moderate depletion  Posterior Calf Region  Moderate depletion  Edema (RD Assessment)  None  Hair  Reviewed  Eyes  Reviewed  Mouth  Reviewed  Skin  Reviewed  Nails  Reviewed      Diet Order:  Diet Carb Modified Fluid consistency: Thin; Room service appropriate? Yes  EDUCATION NEEDS:   No education needs have been identified at this time  Skin:  Skin Assessment: Reviewed RN Assessment  Last BM:  10/23/17  Height:   Ht Readings from Last 1 Encounters:  10/27/17 6\' 1"  (1.854 m)    Weight:   Wt Readings from  Last 1 Encounters:  10/27/17 161 lb 4.8 oz (73.2 kg)   UBW: 155-160 lbs % UBW: 100% Pt reports UBW is 170lbs  Ideal Body Weight:  83.63 kg  BMI:  Body mass index is 21.28 kg/m.  Estimated Nutritional Needs:   Kcal:  2100-2300 kcal  Protein:  95-110 grams   Fluid:  > 1.7 L   Sherrine Maples, Dietetic Intern

## 2017-10-28 NOTE — Progress Notes (Signed)
   10/28/17 1100  Clinical Encounter Type  Visited With Patient  Visit Type Follow-up  Referral From Nurse  Consult/Referral To Chaplain  Pt requested AD chaplin will return later today to assist pt with paper work. Marilynn Latinooni Sione Baumgarten

## 2017-10-28 NOTE — Progress Notes (Signed)
Inpatient Diabetes Program Recommendations  AACE/ADA: New Consensus Statement on Inpatient Glycemic Control (2015)  Target Ranges:  Prepandial:   less than 140 mg/dL      Peak postprandial:   less than 180 mg/dL (1-2 hours)      Critically ill patients:  140 - 180 mg/dL   Results for Wayne [name], Wayne [name] (MRN [id number]) as of 10/28/2017 11:04  Ref. Range 10/27/2017 06:24 10/27/2017 09:35 10/27/2017 13:16 10/27/2017 16:35 10/27/2017 21:23 10/28/2017 06:16  Glucose-Capillary Latest Ref Range: 65 - 99 mg/dL 409227 (H) 811205 (H) 914200 (H) 136 (H) 192 (H) 263 (H)  Results for Wayne [name], Wayne [name] (MRN [id number]) as of 10/28/2017 11:04  Ref. Range 10/27/2017 09:48  Hemoglobin A1C Latest Ref Range: 4.8 - 5.6 % 10.6 (H)   Review of Glycemic Control  Diabetes history: DM2 Outpatient Diabetes medications: Tresiba 45 units QPM, Novolog 20 units TID with meals Current orders for Inpatient glycemic control: Lantus 45 units daily, Novolog 0-15 units TID with meals, Novolog 0-5 units QHS, Novolog 3 units TID with meals for meal coverage  Inpatient Diabetes Program Recommendations: Insulin - Basal: Please consider increasing Lantus to 48 units daily. Insulin - Meal Coverage: Please consider increasing meal coverage to Novolog 6 units TID with meals for meal coverage if patient eats at least 50% of meals. HgbA1C: A1C 10.6% on 10/27/17 indicating an average glucose of 258 mg/dl over the past 2-3 months.  NOTE: Per H&P, patient is homeless, has been out of medications for 1 week, positive for cocaine on drug screen, and admitted with chest pain. Will plan to talk with patient today.  Thanks, Orlando PennerMarie Tayquan Gassman, RN, MSN, CDE Diabetes Coordinator Inpatient Diabetes Program [phone] (Team Pager from 8am to 5pm)

## 2017-10-28 NOTE — Progress Notes (Signed)
Leane Callhaplain Toni and I assisted patient with completing Advanced Directive.  Copy placed on patient's chart and original copy given to patient as per his request.     10/28/17 1210  Clinical Encounter Type  Visited With Patient  Visit Type Follow-up;Spiritual support  Spiritual Encounters  Spiritual Needs Literature (Advanced Directive)  Advance Directives (For Healthcare)  Does Patient Have a Medical Advance Directive? Yes  Type of Estate agentAdvance Directive Healthcare Power of OneidaAttorney

## 2017-10-28 NOTE — Care Management Note (Signed)
Case Management Note Marvetta Gibbons RN, BSN Unit 4E-Case Manager [phone]  Patient Details  Name: Wayne [name] MRN: [id number] Date of Birth: [birth year]  Subjective/Objective:    Pt presented with chest pain +cocaine                Action/Plan: PTA pt reports that he lived at home in [place]- (shares a place with his sister) pt confirms that he has insurance - states that he could not afford his meds due to having a high deductible ($1000) he reports that this has been met and that he should not have a problem now getting his medications. Pt has a PCP- Dr. Jillyn Hidden at [facility] in [place]. Pt plans to return home- will make some phone calls regarding transportation and let bedside RN know if has any need for transportation assistance.   Expected Discharge Date:                  Expected Discharge Plan:  Home/Self Care  In-House Referral:  NA  Discharge planning Services  CM Consult  Post Acute Care Choice:  NA Choice offered to:  NA  DME Arranged:    DME Agency:     HH Arranged:    HH Agency:     Status of Service:  In process, will continue to follow  If discussed at Long Length of Stay Meetings, dates discussed:    Discharge Disposition: home/self care   Additional Comments:  Dawayne Patricia, RN 10/28/2017, 12:17 PM

## 2017-10-28 NOTE — Progress Notes (Signed)
Inpatient Diabetes Program Recommendations  AACE/ADA: New Consensus Statement on Inpatient Glycemic Control (2015)  Target Ranges:  Prepandial:   less than 140 mg/dL      Peak postprandial:   less than 180 mg/dL (1-2 hours)      Critically ill patients:  140 - 180 mg/dL   Lab Results  Component Value Date   GLUCAP 214 (H) 10/28/2017   HGBA1C 10.6 (H) 10/27/2017     Spoke with patient regarding diabetes management. Patient states he has been out of his medications for the last week and doesn't check blood sugars. Patient is homeless and will need a meter and supplies at discharge. Blood glucose meter kit (includes lancets and strips) ([id number]) Wanted to establish goals and how to assist patient. Reviewed patient's current A1c of 10.6%. Explained what a A1c is and what it measures. Also reviewed goal A1c with patient, importance of good glucose control @ home, and blood sugar goals. Patient's A1C was 6% on 09/08/17. Patient verbalizes, " I know what I have to do, it is just I haven't had medications because I couldn't afford them until I met my $1,000 deductible. I have now met this deductible and am planning to receive social security in the next few months. So I can now afford my medications." Patient prefers insulin pen.  Educated patient about [employer] products at [employer], especially the insulin for $25 per vial in the event he gets in a financial bind. He said that he could afford that. Patient understands the importance of insulin. Discussed further the long term co-morbidities of diabetes and increased A1C if he chooses not to take insulin. Patient verbalized understanding.  Briefly discussed Free style Elenor Legato because patient was asking about this. Educated on the device, how it works and the purpose for continual glucose monitoring. Patient is interested and said he could afford. I question this, if he cannot afford deductible? Will continue to follow.  Thanks, Bronson Curb, MSN,  RNC-OB Diabetes Coordinator [phone] (8a-5p)

## 2017-10-29 DIAGNOSIS — Z9119 Patient's noncompliance with other medical treatment and regimen: Secondary | ICD-10-CM

## 2017-10-29 DIAGNOSIS — E440 Moderate protein-calorie malnutrition: Secondary | ICD-10-CM

## 2017-10-29 DIAGNOSIS — I10 Essential (primary) hypertension: Secondary | ICD-10-CM

## 2017-10-29 DIAGNOSIS — Z91199 Patient's noncompliance with other medical treatment and regimen due to unspecified reason: Secondary | ICD-10-CM

## 2017-10-29 LAB — GLUCOSE, CAPILLARY
Glucose-Capillary: 228 mg/dL — ABNORMAL HIGH (ref 65–99)
Glucose-Capillary: 207 mg/dL — ABNORMAL HIGH (ref 65–99)
Glucose-Capillary: 230 mg/dL — ABNORMAL HIGH (ref 65–99)

## 2017-10-29 MED ORDER — BACLOFEN 10 MG PO TABS: 10.0000 mg | ORAL_TABLET | Freq: Three times a day (TID) | ORAL | 0 refills | Status: AC | PRN

## 2017-10-29 MED ORDER — CYANOCOBALAMIN 1000 MCG PO TABS: 1000.0000 ug | ORAL_TABLET | Freq: Every day | ORAL | 0 refills | Status: AC

## 2017-10-29 MED ORDER — LORATADINE 10 MG PO TABS: 10.0000 mg | ORAL_TABLET | Freq: Every day | ORAL | 0 refills | Status: AC

## 2017-10-29 MED ORDER — METHIMAZOLE 5 MG PO TABS: 5.0000 mg | ORAL_TABLET | ORAL | 0 refills | Status: AC

## 2017-10-29 MED ORDER — FREESTYLE SYSTEM KIT: 1.0000 | PACK | 1 refills | Status: AC | PRN

## 2017-10-29 MED ORDER — BUPROPION HCL ER (XL) 300 MG PO TB24: 300.0000 mg | ORAL_TABLET | Freq: Every day | ORAL | 0 refills | Status: AC

## 2017-10-29 MED ORDER — BUDESONIDE-FORMOTEROL FUMARATE 80-4.5 MCG/ACT IN AERO: 2.0000 | INHALATION_SPRAY | Freq: Two times a day (BID) | RESPIRATORY_TRACT | 12 refills | Status: AC

## 2017-10-29 MED ORDER — ACETAMINOPHEN 325 MG PO TABS: 650.0000 mg | ORAL_TABLET | ORAL | Status: AC | PRN

## 2017-10-29 MED ORDER — FLUTICASONE PROPIONATE 50 MCG/ACT NA SUSP: 2.0000 | Freq: Every day | NASAL | 2 refills | Status: AC

## 2017-10-29 NOTE — Discharge Summary (Signed)
Physician Discharge Summary  Wayne [name] KVQ:[id number] DOB: September 02, 1975 DOA: 10/27/2017  PCP: Jamesetta Orleans, PA-C  Admit date: 10/27/2017 Discharge date: 10/29/2017  Admitted From: home Disposition:  home   Recommendations for Outpatient Follow-up:  1. Continue to encourage compliance with medications and cessation from cocaine and cigarettes   Discharge Condition:  stable   CODE STATUS:  Full code   Consultations:  Cardiology     Discharge Diagnoses:  Principal Problem:   Chest pain Active Problems:   Type 2 diabetes mellitus with hyperlipidemia (HCC)   Cocaine abuse (HCC)   Depression, major, recurrent (HCC)   Obstructive sleep apnea   Essential hypertension   COPD (chronic obstructive pulmonary disease) (HCC)   Tobacco abuse   Hypotension due to drugs   Cigarette nicotine dependence with nicotine-induced disorder   Chest pain with high risk for cardiac etiology   Malnutrition of moderate degree    Subjective: He has no complaints of chest pain since being admitted. He has no other complaints for me. Is asking if he can stay in the hospital for dinner because he has nothing to eat at home.  Brief Summary: Wayne [name] Wayne A Foxis a 42 y.o.malewith medical history significantforhomelessness, hypertension, hyperlipidemia, diabetes mellitus, COPD depression with anxiety bipolar disorder, OSA, anemia, CIDP, who presents with chest pain. His chest pain is located in the central chest, which has resolved after 1 dose of NTG. On the following day when I evaluated him, he stated he is not homeless and has insurance. He did not get some of his medications as he had not met his 1k deductible. He states now he has met his deductible and has his prescriptions already so should not have trouble getting his meds.  Admits to snorting cocaine and smoking cigarettes    Hospital Course:  Principal Problem:   Chest pain - has multiple risk factors and also uses Cocaine - troponin  neg, EGK unrevealing - Myoview is intermediate risk with a fixed defect and EF of 45% - ECHO noted below is very normal - have asked for a cardiology opinion- Dr Stanford Breed does not recommend further evaluation and also notes that he may not be compliant with dual antiplatelets if he was to receive a stent.  Active Problems:   Type 2 diabetes mellitus with mild DKA - resolved in ED with treatment - A1c is 10.6 - notoriously non compliant based on past records I have read in Epic - cont home doses of insulin- he states he now has the medications - I have prescribed a glucometer and testing supplies for him as he states he does not have any    Cocaine abuse - habitual- + urine drug screen - snorts it     - advised of risks including stroke, MI, infections, lung complications and advised to discontinue - HIV negative    Depression, major, recurrent  - has been inpatient in behavioral medicine for it  - cont Wellbutrin, Zoloft, Depakote, Abilify Maintaina     Obstructive sleep apnea - CPAP QHS    Hypotension with h/o Essential hypertension - called by RN as BP dropped to 80s by later aftenoon and he felt dizzy laying in the bed - Medications were resumed by admitter based on outpt med list and he was also started on Lopressor - I held the following meds: Metoprolol, Baclofen,  Neurontin   - ordered 1 L NS bolus- BP now elevated- changing Baclofen to PRN     COPD (  chronic obstructive pulmonary disease)    Tobacco abuse - no wheezing- counseled to stop smoking  Hyperthyroid - Tapazol  Chronic inflammatory demyelinating polyneuropathy - Imuran      Discharge Exam: Vitals:   10/29/17 1045 10/29/17 1329  BP:  (!) 140/103  Pulse:  99  Resp:  (!) 29  Temp:  98.4 F (36.9 C)  SpO2: 97% 98%   Vitals:   10/29/17 0829 10/29/17 0952 10/29/17 1045 10/29/17 1329  BP: (!) 171/129 111/81  (!) 140/103  Pulse: 95   99  Resp: 16   (!) 29  Temp: 98 F (36.7 C)   98.4 F  (36.9 C)  TempSrc: Oral   Oral  SpO2:   97% 98%  Weight:      Height:        General: Pt is alert, awake, not in acute distress Cardiovascular: RRR, S1/S2 +, no rubs, no gallops Respiratory: CTA bilaterally, no wheezing, no rhonchi Abdominal: Soft, NT, ND, bowel sounds + Extremities: no edema, no cyanosis   Discharge Instructions  Discharge Instructions    Diet - low sodium heart healthy   Complete by:  As directed    Increase activity slowly   Complete by:  As directed      Allergies as of 10/29/2017      Reactions   Nystatin Other (See Comments)   Reaction:  Muscle weakness    Rosuvastatin    Statins Other (See Comments)   Reaction:  Muscle weakness and stomach cramps    Iodine Rash   Shellfish-derived Products Rash      Medication List    STOP taking these medications   cephALEXin 500 MG capsule Commonly known as:  KEFLEX   diazepam 5 MG tablet Commonly known as:  VALIUM   hydrOXYzine 25 MG capsule Commonly known as:  VISTARIL   methocarbamol 500 MG tablet Commonly known as:  ROBAXIN     TAKE these medications   ABILIFY MAINTENA 400 MG Prsy prefilled syringe Generic drug:  ARIPiprazole ER Inject 400 mg into the muscle every 30 (thirty) days.   acetaminophen 325 MG tablet Commonly known as:  TYLENOL Take 2 tablets (650 mg total) by mouth every 4 (four) hours as needed for headache or mild pain.   albuterol 108 (90 Base) MCG/ACT inhaler Commonly known as:  PROVENTIL HFA;VENTOLIN HFA Inhale into the lungs every 6 (six) hours as needed for wheezing or shortness of breath.   aspirin EC 81 MG tablet Take 81 mg by mouth daily.   azaTHIOprine 50 MG tablet Commonly known as:  IMURAN Take 50 mg by mouth daily.   baclofen 10 MG tablet Commonly known as:  LIORESAL Take 1 tablet (10 mg total) by mouth 3 (three) times daily as needed for muscle spasms. What changed:    when to take this  reasons to take this   budesonide-formoterol 80-4.5 MCG/ACT  inhaler Commonly known as:  SYMBICORT Inhale 2 puffs into the lungs 2 (two) times daily. What changed:  when to take this   buPROPion 300 MG 24 hr tablet Commonly known as:  WELLBUTRIN XL Take 1 tablet (300 mg total) by mouth daily.   cholecalciferol 1000 units tablet Commonly known as:  VITAMIN D Take 1,000 Units by mouth daily.   clotrimazole 1 % cream Commonly known as:  LOTRIMIN Apply 1 application topically 2 (two) times daily.   cyanocobalamin 1000 MCG tablet Take 1 tablet (1,000 mcg total) by mouth daily.   divalproex 250 MG  24 hr tablet Commonly known as:  DEPAKOTE ER Take 250 mg by mouth daily.   ezetimibe 10 MG tablet Commonly known as:  ZETIA Take 10 mg by mouth daily.   fluticasone 50 MCG/ACT nasal spray Commonly known as:  FLONASE Place 2 sprays into both nostrils daily.   gabapentin 300 MG capsule Commonly known as:  NEURONTIN Take 300 mg by mouth 3 (three) times daily.   glucosamine-chondroitin 500-400 MG tablet Take 1 tablet by mouth 3 (three) times daily.   glucose monitoring kit monitoring kit 1 each by Does not apply route as needed for other. Dispense any model that is covered- dispense testing supplies for Q AC/ HS accuchecks- 1 month supply with one refil.   ibuprofen 800 MG tablet Commonly known as:  ADVIL,MOTRIN Take 800 mg by mouth every 6 (six) hours as needed for moderate pain.   insulin aspart 100 UNIT/ML injection Commonly known as:  novoLOG Inject 20 Units into the skin 3 (three) times daily before meals.   Insulin Degludec 100 UNIT/ML Soln Inject 45 Units into the skin every evening.   loratadine 10 MG tablet Commonly known as:  CLARITIN Take 1 tablet (10 mg total) by mouth daily.   losartan 25 MG tablet Commonly known as:  COZAAR Take 25 mg by mouth daily.   methimazole 5 MG tablet Commonly known as:  TAPAZOLE Take 1 tablet (5 mg total) by mouth every other day.   mirtazapine 15 MG tablet Commonly known as:   REMERON Take 15 mg by mouth at bedtime.   multivitamin with minerals Tabs tablet Take 1 tablet by mouth daily.   ondansetron 4 MG tablet Commonly known as:  ZOFRAN Take 4 mg by mouth every 8 (eight) hours as needed for nausea or vomiting.   oxybutynin 10 MG 24 hr tablet Commonly known as:  DITROPAN-XL Take 10 mg by mouth at bedtime.   pantoprazole 40 MG tablet Commonly known as:  PROTONIX Take 40 mg by mouth daily.   sertraline 100 MG tablet Commonly known as:  ZOLOFT Take 200 mg by mouth daily.   sildenafil 50 MG tablet Commonly known as:  VIAGRA Take 50 mg by mouth daily as needed for erectile dysfunction.   traMADol 50 MG tablet Commonly known as:  ULTRAM Take 50 mg by mouth every 8 (eight) hours as needed for moderate pain.      Follow-up Information    Crenshaw, Denice Bors, MD. Schedule an appointment as soon as possible for a visit.   Specialty:  Cardiology Why:  the office will call you next week, if you have not heard call the office for appt.  Contact information: [address] [place] [address] [phone]        Jamesetta Orleans, PA-C. Schedule an appointment as soon as possible for a visit in 1 week(s).   Specialty:  Internal Medicine Contact information: [address] [place] [address] [phone]          Allergies  Allergen Reactions  . Nystatin Other (See Comments)    Reaction:  Muscle weakness   . Rosuvastatin   . Statins Other (See Comments)    Reaction:  Muscle weakness and stomach cramps   . Iodine Rash  . Shellfish-Derived Products Rash     Procedures/Studies: 2 d ECHO - Normal LV size with mild LV hypertrophy. EF 60-65%. Normal RV   size and systolic function. No significant valvular   abnormalities.     Dg  Chest 2 View  Result Date: 10/27/2017 CLINICAL DATA:  Chest pain radiating to the left arm, shortness of breath, and cough for 1 hour. History of COPD, hypertension, smoker. EXAM: CHEST - 2 VIEW  COMPARISON:  08/20/2016 FINDINGS: The heart size and mediastinal contours are within normal limits. Both lungs are clear. The visualized skeletal structures are unremarkable. IMPRESSION: No active cardiopulmonary disease. Electronically Signed   By: Lucienne Capers M.D.   On: 10/27/2017 02:53   Nm Myocar Multi W/spect W/wall Motion / Ef  Result Date: 10/28/2017  Small region of ischemia in the inferior wall (base, mid), inferolateral wall (base) Otherwise normal perfusion.  Nuclear stress EF: 42%.  This is an intermediate risk study.    Dg Foot Complete Left  Result Date: 10/18/2017 Please see detailed radiograph report in office note.    The results of significant diagnostics from this hospitalization (including imaging, microbiology, ancillary and laboratory) are listed below for reference.     Microbiology: Recent Results (from the past 240 hour(s))  MRSA PCR Screening     Status: None   Collection Time: 10/27/17  4:21 PM  Result Value Ref Range Status   MRSA by PCR NEGATIVE NEGATIVE Final    Comment:        The GeneXpert MRSA Assay (FDA approved for NASAL specimens only), is one component of a comprehensive MRSA colonization surveillance program. It is not intended to diagnose MRSA infection nor to guide or monitor treatment for MRSA infections. Performed at [facility], [place] [address], [place],  [address]      Labs: BNP (last 3 results) No results for input(s): BNP in the last 8760 hours. Basic Metabolic Panel: Recent Labs  Lab 10/27/17 0213 10/27/17 0611 10/27/17 0948 10/27/17 1511 10/28/17 0251  NA 140 138 138 140 138  K 4.0 3.7 3.9 3.5 3.2*  CL 104 104 104 106 104  CO2 19* 20* 20* 24 26  GLUCOSE 234* 235* 233* 185* 276*  BUN '17 15 14 16 14  ' CREATININE 1.25* 1.21 1.17  1.24 1.14 1.02  CALCIUM 9.1 8.8* 8.5* 8.6* 8.6*   Liver Function Tests: No results for input(s): AST, ALT, ALKPHOS, BILITOT, PROT, ALBUMIN in the last 168 hours. No  results for input(s): LIPASE, AMYLASE in the last 168 hours. No results for input(s): AMMONIA in the last 168 hours. CBC: Recent Labs  Lab 10/27/17 0213 10/27/17 0948  WBC 4.0 2.9*  HGB 10.6* 10.8*  HCT 32.0* 31.8*  MCV 90.4 90.3  PLT 205 181   Cardiac Enzymes: Recent Labs  Lab 10/27/17 0611 10/27/17 1258 10/27/17 1833  TROPONINI <0.03 <0.03 <0.03   BNP: Invalid input(s): POCBNP CBG: Recent Labs  Lab 10/28/17 1612 10/28/17 2122 10/29/17 0628 10/29/17 0826 10/29/17 1124  GLUCAP 243* 205* 228* 207* 230*   D-Dimer No results for input(s): DDIMER in the last 72 hours. Hgb A1c Recent Labs    10/27/17 0948  HGBA1C 10.6*   Lipid Profile No results for input(s): CHOL, HDL, LDLCALC, TRIG, CHOLHDL, LDLDIRECT in the last 72 hours. Thyroid function studies No results for input(s): TSH, T4TOTAL, T3FREE, THYROIDAB in the last 72 hours.  Invalid input(s): FREET3 Anemia work up No results for input(s): VITAMINB12, FOLATE, FERRITIN, TIBC, IRON, RETICCTPCT in the last 72 hours. Urinalysis    Component Value Date/Time   COLORURINE YELLOW 10/27/2017 0948   APPEARANCEUR CLEAR 10/27/2017 0948   LABSPEC 1.028 10/27/2017 0948   PHURINE 6.0 10/27/2017 0948   GLUCOSEU >=500 (A) 10/27/2017  [place] >=1000 11/13/2009 1033   HGBUR SMALL (A) 10/27/2017 0948   BILIRUBINUR NEGATIVE 10/27/2017 0948   BILIRUBINUR negative 02/03/2015 1155   KETONESUR 80 (A) 10/27/2017 0948   PROTEINUR 100 (A) 10/27/2017 0948   UROBILINOGEN 0.2 02/15/2015 1911   NITRITE NEGATIVE 10/27/2017 0948   LEUKOCYTESUR NEGATIVE 10/27/2017 0948   Sepsis Labs Invalid input(s): PROCALCITONIN,  WBC,  LACTICIDVEN Microbiology Recent Results (from the past 240 hour(s))  MRSA PCR Screening     Status: None   Collection Time: 10/27/17  4:21 PM  Result Value Ref Range Status   MRSA by PCR NEGATIVE NEGATIVE Final    Comment:        The GeneXpert MRSA Assay (FDA approved for NASAL specimens only), is one  component of a comprehensive MRSA colonization surveillance program. It is not intended to diagnose MRSA infection nor to guide or monitor treatment for MRSA infections. Performed at [facility], [place] [address], [place], [place] [address]      Time coordinating discharge: 52  SIGNED:   Debbe Odea, MD  [facility] 10/29/2017, 2:03 PM Pager   If 7PM-7AM, please contact night-coverage [email or web address] Password [email or web address]

## 2017-10-29 NOTE — Plan of Care (Signed)
  Problem: Education: Goal: Knowledge of General Education information will improve Outcome: Progressing   Problem: Health Behavior/Discharge Planning: Goal: Ability to manage health-related needs will improve Outcome: Progressing   Problem: Clinical Measurements: Goal: Ability to maintain clinical measurements within normal limits will improve Outcome: Progressing Goal: Will remain free from infection Outcome: Progressing Goal: Diagnostic test results will improve Outcome: Progressing Goal: Respiratory complications will improve Outcome: Progressing Goal: Cardiovascular complication will be avoided Outcome: Progressing   Problem: Coping: Goal: Level of anxiety will decrease Outcome: Progressing   Problem: Safety: Goal: Ability to remain free from injury will improve Outcome: Progressing   Problem: Spiritual Needs Goal: Ability to function at adequate level Outcome: Progressing

## 2017-11-01 ENCOUNTER — Telehealth: Payer: Self-pay | Admitting: Cardiology

## 2017-11-01 NOTE — Telephone Encounter (Signed)
Closed Encounter  °

## 2017-12-13 ENCOUNTER — Ambulatory Visit: Payer: BLUE CROSS/BLUE SHIELD | Admitting: Physician Assistant

## 2017-12-14 ENCOUNTER — Encounter: Payer: Self-pay | Admitting: Physician Assistant

## 2018-01-18 ENCOUNTER — Ambulatory Visit: Payer: BLUE CROSS/BLUE SHIELD | Admitting: Podiatry

## 2019-06-26 DEATH — deceased
